# Patient Record
Sex: Female | Born: 1980 | Race: Black or African American | Hispanic: No | Marital: Married | Smoking: Former smoker
Health system: Southern US, Community
[De-identification: ages and names within clinical notes are randomized; demographics above are authoritative.]

## PROBLEM LIST (undated history)

## (undated) ENCOUNTER — Inpatient Hospital Stay (HOSPITAL_COMMUNITY): Payer: Self-pay

## (undated) DIAGNOSIS — K589 Irritable bowel syndrome without diarrhea: Secondary | ICD-10-CM

## (undated) DIAGNOSIS — D649 Anemia, unspecified: Secondary | ICD-10-CM

## (undated) DIAGNOSIS — K219 Gastro-esophageal reflux disease without esophagitis: Secondary | ICD-10-CM

## (undated) DIAGNOSIS — F419 Anxiety disorder, unspecified: Secondary | ICD-10-CM

## (undated) DIAGNOSIS — R51 Headache: Secondary | ICD-10-CM

## (undated) DIAGNOSIS — M1990 Unspecified osteoarthritis, unspecified site: Secondary | ICD-10-CM

## (undated) DIAGNOSIS — K2210 Ulcer of esophagus without bleeding: Secondary | ICD-10-CM

## (undated) DIAGNOSIS — Z8669 Personal history of other diseases of the nervous system and sense organs: Secondary | ICD-10-CM

## (undated) DIAGNOSIS — B999 Unspecified infectious disease: Secondary | ICD-10-CM

## (undated) HISTORY — DX: Anemia, unspecified: D64.9

## (undated) HISTORY — DX: Personal history of other diseases of the nervous system and sense organs: Z86.69

## (undated) HISTORY — DX: Ulcer of esophagus without bleeding: K22.10

## (undated) HISTORY — PX: WRIST SURGERY: SHX841

## (undated) HISTORY — PX: NO PAST SURGERIES: SHX2092

## (undated) HISTORY — DX: Gastro-esophageal reflux disease without esophagitis: K21.9

## (undated) HISTORY — DX: Irritable bowel syndrome, unspecified: K58.9

---

## 2000-02-07 ENCOUNTER — Emergency Department (HOSPITAL_COMMUNITY): Admission: EM | Admit: 2000-02-07 | Discharge: 2000-02-07 | Payer: Self-pay | Admitting: Emergency Medicine

## 2000-04-14 ENCOUNTER — Emergency Department (HOSPITAL_COMMUNITY): Admission: EM | Admit: 2000-04-14 | Discharge: 2000-04-14 | Payer: Self-pay | Admitting: Emergency Medicine

## 2000-04-14 ENCOUNTER — Encounter: Payer: Self-pay | Admitting: Emergency Medicine

## 2001-02-25 ENCOUNTER — Emergency Department (HOSPITAL_COMMUNITY): Admission: EM | Admit: 2001-02-25 | Discharge: 2001-02-25 | Payer: Self-pay | Admitting: Emergency Medicine

## 2001-03-08 ENCOUNTER — Emergency Department (HOSPITAL_COMMUNITY): Admission: EM | Admit: 2001-03-08 | Discharge: 2001-03-08 | Payer: Self-pay | Admitting: Emergency Medicine

## 2001-05-08 ENCOUNTER — Emergency Department (HOSPITAL_COMMUNITY): Admission: EM | Admit: 2001-05-08 | Discharge: 2001-05-08 | Payer: Self-pay | Admitting: Emergency Medicine

## 2001-08-26 ENCOUNTER — Emergency Department (HOSPITAL_COMMUNITY): Admission: EM | Admit: 2001-08-26 | Discharge: 2001-08-26 | Payer: Self-pay | Admitting: Emergency Medicine

## 2001-08-31 ENCOUNTER — Emergency Department (HOSPITAL_COMMUNITY): Admission: EM | Admit: 2001-08-31 | Discharge: 2001-09-01 | Payer: Self-pay | Admitting: Emergency Medicine

## 2001-09-01 ENCOUNTER — Encounter: Payer: Self-pay | Admitting: Emergency Medicine

## 2001-09-17 ENCOUNTER — Emergency Department (HOSPITAL_COMMUNITY): Admission: EM | Admit: 2001-09-17 | Discharge: 2001-09-17 | Payer: Self-pay | Admitting: Emergency Medicine

## 2001-09-17 ENCOUNTER — Encounter: Payer: Self-pay | Admitting: *Deleted

## 2001-09-23 ENCOUNTER — Emergency Department (HOSPITAL_COMMUNITY): Admission: EM | Admit: 2001-09-23 | Discharge: 2001-09-23 | Payer: Self-pay | Admitting: Emergency Medicine

## 2001-10-21 ENCOUNTER — Encounter: Admission: RE | Admit: 2001-10-21 | Discharge: 2001-11-18 | Payer: Self-pay | Admitting: Orthopedic Surgery

## 2001-12-09 ENCOUNTER — Inpatient Hospital Stay (HOSPITAL_COMMUNITY): Admission: AD | Admit: 2001-12-09 | Discharge: 2001-12-09 | Payer: Self-pay | Admitting: *Deleted

## 2001-12-10 ENCOUNTER — Encounter: Admission: RE | Admit: 2001-12-10 | Discharge: 2002-01-30 | Payer: Self-pay | Admitting: Orthopedic Surgery

## 2002-01-05 ENCOUNTER — Emergency Department (HOSPITAL_COMMUNITY): Admission: EM | Admit: 2002-01-05 | Discharge: 2002-01-05 | Payer: Self-pay | Admitting: Emergency Medicine

## 2002-01-17 ENCOUNTER — Encounter: Payer: Self-pay | Admitting: Emergency Medicine

## 2002-01-17 ENCOUNTER — Emergency Department (HOSPITAL_COMMUNITY): Admission: EM | Admit: 2002-01-17 | Discharge: 2002-01-17 | Payer: Self-pay | Admitting: Emergency Medicine

## 2002-03-13 ENCOUNTER — Encounter: Admission: RE | Admit: 2002-03-13 | Discharge: 2002-03-31 | Payer: Self-pay | Admitting: Anesthesiology

## 2002-04-02 ENCOUNTER — Encounter: Admission: RE | Admit: 2002-04-02 | Discharge: 2002-07-01 | Payer: Self-pay

## 2002-05-21 ENCOUNTER — Emergency Department (HOSPITAL_COMMUNITY): Admission: EM | Admit: 2002-05-21 | Discharge: 2002-05-22 | Payer: Self-pay | Admitting: Emergency Medicine

## 2002-06-17 ENCOUNTER — Encounter: Payer: Self-pay | Admitting: Emergency Medicine

## 2002-06-17 ENCOUNTER — Emergency Department (HOSPITAL_COMMUNITY): Admission: EM | Admit: 2002-06-17 | Discharge: 2002-06-17 | Payer: Self-pay | Admitting: Emergency Medicine

## 2002-07-22 ENCOUNTER — Encounter: Admission: RE | Admit: 2002-07-22 | Discharge: 2002-10-20 | Payer: Self-pay

## 2002-08-10 ENCOUNTER — Emergency Department (HOSPITAL_COMMUNITY): Admission: EM | Admit: 2002-08-10 | Discharge: 2002-08-10 | Payer: Self-pay | Admitting: Emergency Medicine

## 2002-11-04 ENCOUNTER — Emergency Department (HOSPITAL_COMMUNITY): Admission: EM | Admit: 2002-11-04 | Discharge: 2002-11-04 | Payer: Self-pay | Admitting: Emergency Medicine

## 2002-11-04 ENCOUNTER — Encounter: Payer: Self-pay | Admitting: Emergency Medicine

## 2002-12-26 ENCOUNTER — Emergency Department (HOSPITAL_COMMUNITY): Admission: EM | Admit: 2002-12-26 | Discharge: 2002-12-26 | Payer: Self-pay | Admitting: Emergency Medicine

## 2003-01-27 ENCOUNTER — Emergency Department (HOSPITAL_COMMUNITY): Admission: EM | Admit: 2003-01-27 | Discharge: 2003-01-27 | Payer: Self-pay | Admitting: Emergency Medicine

## 2003-01-27 ENCOUNTER — Encounter: Payer: Self-pay | Admitting: Emergency Medicine

## 2003-03-02 ENCOUNTER — Emergency Department (HOSPITAL_COMMUNITY): Admission: EM | Admit: 2003-03-02 | Discharge: 2003-03-02 | Payer: Self-pay | Admitting: Emergency Medicine

## 2003-03-29 ENCOUNTER — Encounter: Payer: Self-pay | Admitting: Emergency Medicine

## 2003-03-29 ENCOUNTER — Emergency Department (HOSPITAL_COMMUNITY): Admission: EM | Admit: 2003-03-29 | Discharge: 2003-03-29 | Payer: Self-pay | Admitting: Emergency Medicine

## 2003-04-16 ENCOUNTER — Encounter: Payer: Self-pay | Admitting: Family Medicine

## 2003-04-16 ENCOUNTER — Emergency Department (HOSPITAL_COMMUNITY): Admission: EM | Admit: 2003-04-16 | Discharge: 2003-04-16 | Payer: Self-pay | Admitting: Emergency Medicine

## 2003-04-22 ENCOUNTER — Emergency Department (HOSPITAL_COMMUNITY): Admission: AD | Admit: 2003-04-22 | Discharge: 2003-04-22 | Payer: Self-pay | Admitting: Family Medicine

## 2003-04-28 ENCOUNTER — Inpatient Hospital Stay (HOSPITAL_COMMUNITY): Admission: AD | Admit: 2003-04-28 | Discharge: 2003-04-28 | Payer: Self-pay | Admitting: Obstetrics and Gynecology

## 2003-05-04 ENCOUNTER — Encounter: Admission: RE | Admit: 2003-05-04 | Discharge: 2003-05-24 | Payer: Self-pay | Admitting: Family Medicine

## 2003-07-20 ENCOUNTER — Emergency Department (HOSPITAL_COMMUNITY): Admission: EM | Admit: 2003-07-20 | Discharge: 2003-07-20 | Payer: Self-pay

## 2003-09-09 ENCOUNTER — Emergency Department (HOSPITAL_COMMUNITY): Admission: EM | Admit: 2003-09-09 | Discharge: 2003-09-09 | Payer: Self-pay | Admitting: Emergency Medicine

## 2003-10-06 ENCOUNTER — Emergency Department (HOSPITAL_COMMUNITY): Admission: EM | Admit: 2003-10-06 | Discharge: 2003-10-06 | Payer: Self-pay | Admitting: Emergency Medicine

## 2003-10-12 ENCOUNTER — Emergency Department (HOSPITAL_COMMUNITY): Admission: EM | Admit: 2003-10-12 | Discharge: 2003-10-12 | Payer: Self-pay | Admitting: Emergency Medicine

## 2003-11-15 ENCOUNTER — Emergency Department (HOSPITAL_COMMUNITY): Admission: EM | Admit: 2003-11-15 | Discharge: 2003-11-15 | Payer: Self-pay | Admitting: Emergency Medicine

## 2004-01-31 ENCOUNTER — Emergency Department (HOSPITAL_COMMUNITY): Admission: EM | Admit: 2004-01-31 | Discharge: 2004-01-31 | Payer: Self-pay | Admitting: Emergency Medicine

## 2004-02-13 ENCOUNTER — Emergency Department (HOSPITAL_COMMUNITY): Admission: EM | Admit: 2004-02-13 | Discharge: 2004-02-13 | Payer: Self-pay | Admitting: *Deleted

## 2004-03-28 ENCOUNTER — Encounter: Admission: RE | Admit: 2004-03-28 | Discharge: 2004-06-26 | Payer: Self-pay | Admitting: Orthopedic Surgery

## 2004-04-09 ENCOUNTER — Emergency Department (HOSPITAL_COMMUNITY): Admission: EM | Admit: 2004-04-09 | Discharge: 2004-04-09 | Payer: Self-pay | Admitting: Emergency Medicine

## 2004-04-10 ENCOUNTER — Ambulatory Visit: Payer: Self-pay | Admitting: Obstetrics & Gynecology

## 2004-04-10 ENCOUNTER — Inpatient Hospital Stay (HOSPITAL_COMMUNITY): Admission: AD | Admit: 2004-04-10 | Discharge: 2004-04-10 | Payer: Self-pay | Admitting: Obstetrics and Gynecology

## 2004-04-12 ENCOUNTER — Observation Stay (HOSPITAL_COMMUNITY): Admission: AD | Admit: 2004-04-12 | Discharge: 2004-04-13 | Payer: Self-pay | Admitting: Obstetrics and Gynecology

## 2004-04-19 ENCOUNTER — Inpatient Hospital Stay (HOSPITAL_COMMUNITY): Admission: AD | Admit: 2004-04-19 | Discharge: 2004-04-21 | Payer: Self-pay | Admitting: Obstetrics and Gynecology

## 2004-04-25 ENCOUNTER — Observation Stay (HOSPITAL_COMMUNITY): Admission: AD | Admit: 2004-04-25 | Discharge: 2004-04-26 | Payer: Self-pay | Admitting: *Deleted

## 2004-07-10 ENCOUNTER — Ambulatory Visit (HOSPITAL_COMMUNITY): Admission: RE | Admit: 2004-07-10 | Discharge: 2004-07-10 | Payer: Self-pay | Admitting: *Deleted

## 2004-07-17 ENCOUNTER — Inpatient Hospital Stay (HOSPITAL_COMMUNITY): Admission: AD | Admit: 2004-07-17 | Discharge: 2004-07-17 | Payer: Self-pay | Admitting: Obstetrics & Gynecology

## 2004-08-07 ENCOUNTER — Inpatient Hospital Stay (HOSPITAL_COMMUNITY): Admission: AD | Admit: 2004-08-07 | Discharge: 2004-08-07 | Payer: Self-pay | Admitting: Obstetrics & Gynecology

## 2004-09-23 ENCOUNTER — Inpatient Hospital Stay (HOSPITAL_COMMUNITY): Admission: AD | Admit: 2004-09-23 | Discharge: 2004-09-23 | Payer: Self-pay | Admitting: Obstetrics

## 2004-11-18 ENCOUNTER — Inpatient Hospital Stay (HOSPITAL_COMMUNITY): Admission: RE | Admit: 2004-11-18 | Discharge: 2004-11-19 | Payer: Self-pay | Admitting: Obstetrics

## 2004-11-24 ENCOUNTER — Inpatient Hospital Stay (HOSPITAL_COMMUNITY): Admission: AD | Admit: 2004-11-24 | Discharge: 2004-11-24 | Payer: Self-pay | Admitting: Obstetrics

## 2004-11-26 ENCOUNTER — Inpatient Hospital Stay (HOSPITAL_COMMUNITY): Admission: AD | Admit: 2004-11-26 | Discharge: 2004-11-28 | Payer: Self-pay | Admitting: Obstetrics

## 2005-01-03 ENCOUNTER — Emergency Department (HOSPITAL_COMMUNITY): Admission: EM | Admit: 2005-01-03 | Discharge: 2005-01-04 | Payer: Self-pay | Admitting: Emergency Medicine

## 2005-01-24 ENCOUNTER — Emergency Department (HOSPITAL_COMMUNITY): Admission: EM | Admit: 2005-01-24 | Discharge: 2005-01-24 | Payer: Self-pay | Admitting: Family Medicine

## 2005-02-03 ENCOUNTER — Emergency Department (HOSPITAL_COMMUNITY): Admission: EM | Admit: 2005-02-03 | Discharge: 2005-02-03 | Payer: Self-pay | Admitting: Emergency Medicine

## 2005-02-17 ENCOUNTER — Emergency Department (HOSPITAL_COMMUNITY): Admission: EM | Admit: 2005-02-17 | Discharge: 2005-02-17 | Payer: Self-pay | Admitting: Emergency Medicine

## 2005-08-29 ENCOUNTER — Emergency Department (HOSPITAL_COMMUNITY): Admission: EM | Admit: 2005-08-29 | Discharge: 2005-08-29 | Payer: Self-pay | Admitting: Family Medicine

## 2005-09-16 ENCOUNTER — Emergency Department (HOSPITAL_COMMUNITY): Admission: EM | Admit: 2005-09-16 | Discharge: 2005-09-16 | Payer: Self-pay | Admitting: Emergency Medicine

## 2005-09-29 ENCOUNTER — Emergency Department (HOSPITAL_COMMUNITY): Admission: EM | Admit: 2005-09-29 | Discharge: 2005-09-29 | Payer: Self-pay | Admitting: Emergency Medicine

## 2005-11-12 ENCOUNTER — Emergency Department (HOSPITAL_COMMUNITY): Admission: EM | Admit: 2005-11-12 | Discharge: 2005-11-12 | Payer: Self-pay | Admitting: Emergency Medicine

## 2005-11-20 ENCOUNTER — Emergency Department (HOSPITAL_COMMUNITY): Admission: EM | Admit: 2005-11-20 | Discharge: 2005-11-21 | Payer: Self-pay | Admitting: Emergency Medicine

## 2006-07-21 ENCOUNTER — Emergency Department (HOSPITAL_COMMUNITY): Admission: EM | Admit: 2006-07-21 | Discharge: 2006-07-21 | Payer: Self-pay | Admitting: Family Medicine

## 2006-07-21 ENCOUNTER — Emergency Department (HOSPITAL_COMMUNITY): Admission: EM | Admit: 2006-07-21 | Discharge: 2006-07-21 | Payer: Self-pay | Admitting: Emergency Medicine

## 2006-09-12 ENCOUNTER — Emergency Department (HOSPITAL_COMMUNITY): Admission: EM | Admit: 2006-09-12 | Discharge: 2006-09-12 | Payer: Self-pay | Admitting: Family Medicine

## 2006-09-16 ENCOUNTER — Emergency Department (HOSPITAL_COMMUNITY): Admission: EM | Admit: 2006-09-16 | Discharge: 2006-09-16 | Payer: Self-pay | Admitting: Emergency Medicine

## 2006-09-20 ENCOUNTER — Emergency Department (HOSPITAL_COMMUNITY): Admission: EM | Admit: 2006-09-20 | Discharge: 2006-09-20 | Payer: Self-pay | Admitting: Emergency Medicine

## 2006-09-21 ENCOUNTER — Emergency Department (HOSPITAL_COMMUNITY): Admission: EM | Admit: 2006-09-21 | Discharge: 2006-09-21 | Payer: Self-pay | Admitting: Emergency Medicine

## 2006-10-17 ENCOUNTER — Emergency Department (HOSPITAL_COMMUNITY): Admission: EM | Admit: 2006-10-17 | Discharge: 2006-10-17 | Payer: Self-pay | Admitting: Family Medicine

## 2007-01-13 ENCOUNTER — Emergency Department (HOSPITAL_COMMUNITY): Admission: EM | Admit: 2007-01-13 | Discharge: 2007-01-13 | Payer: Self-pay | Admitting: Emergency Medicine

## 2007-02-08 ENCOUNTER — Inpatient Hospital Stay (HOSPITAL_COMMUNITY): Admission: AD | Admit: 2007-02-08 | Discharge: 2007-02-09 | Payer: Self-pay | Admitting: Obstetrics

## 2007-04-25 ENCOUNTER — Emergency Department (HOSPITAL_COMMUNITY): Admission: EM | Admit: 2007-04-25 | Discharge: 2007-04-25 | Payer: Self-pay | Admitting: Family Medicine

## 2007-05-04 ENCOUNTER — Inpatient Hospital Stay (HOSPITAL_COMMUNITY): Admission: AD | Admit: 2007-05-04 | Discharge: 2007-05-04 | Payer: Self-pay | Admitting: Obstetrics

## 2007-06-24 ENCOUNTER — Emergency Department (HOSPITAL_COMMUNITY): Admission: EM | Admit: 2007-06-24 | Discharge: 2007-06-24 | Payer: Self-pay | Admitting: Emergency Medicine

## 2007-06-28 ENCOUNTER — Emergency Department (HOSPITAL_COMMUNITY): Admission: EM | Admit: 2007-06-28 | Discharge: 2007-06-28 | Payer: Self-pay | Admitting: Emergency Medicine

## 2007-06-29 ENCOUNTER — Emergency Department (HOSPITAL_COMMUNITY): Admission: EM | Admit: 2007-06-29 | Discharge: 2007-06-29 | Payer: Self-pay | Admitting: Emergency Medicine

## 2007-08-19 ENCOUNTER — Emergency Department (HOSPITAL_COMMUNITY): Admission: EM | Admit: 2007-08-19 | Discharge: 2007-08-19 | Payer: Self-pay | Admitting: Emergency Medicine

## 2007-08-20 ENCOUNTER — Emergency Department (HOSPITAL_COMMUNITY): Admission: EM | Admit: 2007-08-20 | Discharge: 2007-08-20 | Payer: Self-pay | Admitting: Emergency Medicine

## 2007-09-08 ENCOUNTER — Emergency Department (HOSPITAL_COMMUNITY): Admission: EM | Admit: 2007-09-08 | Discharge: 2007-09-08 | Payer: Self-pay | Admitting: Emergency Medicine

## 2007-12-19 ENCOUNTER — Emergency Department (HOSPITAL_COMMUNITY): Admission: EM | Admit: 2007-12-19 | Discharge: 2007-12-19 | Payer: Self-pay | Admitting: Emergency Medicine

## 2008-03-24 ENCOUNTER — Emergency Department (HOSPITAL_COMMUNITY): Admission: EM | Admit: 2008-03-24 | Discharge: 2008-03-24 | Payer: Self-pay | Admitting: Internal Medicine

## 2008-05-13 ENCOUNTER — Emergency Department (HOSPITAL_COMMUNITY): Admission: EM | Admit: 2008-05-13 | Discharge: 2008-05-13 | Payer: Self-pay | Admitting: Emergency Medicine

## 2008-09-03 ENCOUNTER — Emergency Department (HOSPITAL_COMMUNITY): Admission: EM | Admit: 2008-09-03 | Discharge: 2008-09-03 | Payer: Self-pay | Admitting: Emergency Medicine

## 2008-10-17 ENCOUNTER — Emergency Department (HOSPITAL_COMMUNITY): Admission: EM | Admit: 2008-10-17 | Discharge: 2008-10-18 | Payer: Self-pay | Admitting: Emergency Medicine

## 2008-10-19 ENCOUNTER — Emergency Department (HOSPITAL_COMMUNITY): Admission: EM | Admit: 2008-10-19 | Discharge: 2008-10-19 | Payer: Self-pay | Admitting: Emergency Medicine

## 2008-12-04 ENCOUNTER — Emergency Department (HOSPITAL_COMMUNITY): Admission: EM | Admit: 2008-12-04 | Discharge: 2008-12-04 | Payer: Self-pay | Admitting: Emergency Medicine

## 2008-12-06 ENCOUNTER — Emergency Department (HOSPITAL_COMMUNITY): Admission: EM | Admit: 2008-12-06 | Discharge: 2008-12-06 | Payer: Self-pay | Admitting: Emergency Medicine

## 2008-12-06 ENCOUNTER — Emergency Department (HOSPITAL_COMMUNITY): Admission: EM | Admit: 2008-12-06 | Discharge: 2008-12-06 | Payer: Self-pay | Admitting: Family Medicine

## 2009-01-06 ENCOUNTER — Emergency Department (HOSPITAL_COMMUNITY): Admission: EM | Admit: 2009-01-06 | Discharge: 2009-01-06 | Payer: Self-pay | Admitting: Family Medicine

## 2009-03-09 ENCOUNTER — Encounter (INDEPENDENT_AMBULATORY_CARE_PROVIDER_SITE_OTHER): Payer: Self-pay | Admitting: *Deleted

## 2009-03-09 ENCOUNTER — Emergency Department (HOSPITAL_COMMUNITY): Admission: EM | Admit: 2009-03-09 | Discharge: 2009-03-09 | Payer: Self-pay | Admitting: Emergency Medicine

## 2009-03-10 ENCOUNTER — Telehealth: Payer: Self-pay | Admitting: Gastroenterology

## 2009-03-11 ENCOUNTER — Ambulatory Visit: Payer: Self-pay | Admitting: Internal Medicine

## 2009-03-11 DIAGNOSIS — J4520 Mild intermittent asthma, uncomplicated: Secondary | ICD-10-CM

## 2009-03-11 DIAGNOSIS — D649 Anemia, unspecified: Secondary | ICD-10-CM

## 2009-03-11 LAB — CONVERTED CEMR LAB
Basophils Relative: 0.4 % (ref 0.0–3.0)
Eosinophils Relative: 5.1 % — ABNORMAL HIGH (ref 0.0–5.0)
HCT: 36 % (ref 36.0–46.0)
Hemoglobin: 12.4 g/dL (ref 12.0–15.0)
Lymphs Abs: 3.6 10*3/uL (ref 0.7–4.0)
MCV: 96.4 fL (ref 78.0–100.0)
Monocytes Absolute: 0.5 10*3/uL (ref 0.1–1.0)
Neutro Abs: 2.9 10*3/uL (ref 1.4–7.7)
RBC: 3.73 M/uL — ABNORMAL LOW (ref 3.87–5.11)
WBC: 7.4 10*3/uL (ref 4.5–10.5)

## 2009-03-12 ENCOUNTER — Ambulatory Visit: Payer: Self-pay | Admitting: Internal Medicine

## 2009-03-12 ENCOUNTER — Encounter: Payer: Self-pay | Admitting: Gastroenterology

## 2009-03-12 ENCOUNTER — Ambulatory Visit: Payer: Self-pay | Admitting: Infectious Diseases

## 2009-03-12 ENCOUNTER — Inpatient Hospital Stay (HOSPITAL_COMMUNITY): Admission: EM | Admit: 2009-03-12 | Discharge: 2009-03-15 | Payer: Self-pay | Admitting: Emergency Medicine

## 2009-03-22 ENCOUNTER — Encounter (INDEPENDENT_AMBULATORY_CARE_PROVIDER_SITE_OTHER): Payer: Self-pay | Admitting: Internal Medicine

## 2009-03-25 ENCOUNTER — Telehealth: Payer: Self-pay | Admitting: Physician Assistant

## 2009-03-26 ENCOUNTER — Telehealth: Payer: Self-pay | Admitting: Internal Medicine

## 2009-03-28 ENCOUNTER — Ambulatory Visit: Payer: Self-pay | Admitting: Gastroenterology

## 2009-03-28 DIAGNOSIS — K210 Gastro-esophageal reflux disease with esophagitis: Secondary | ICD-10-CM

## 2009-03-29 ENCOUNTER — Encounter: Payer: Self-pay | Admitting: Internal Medicine

## 2009-03-29 ENCOUNTER — Ambulatory Visit: Payer: Self-pay | Admitting: Infectious Diseases

## 2009-03-29 ENCOUNTER — Ambulatory Visit (HOSPITAL_COMMUNITY): Admission: RE | Admit: 2009-03-29 | Discharge: 2009-03-29 | Payer: Self-pay | Admitting: Infectious Diseases

## 2009-03-29 DIAGNOSIS — K589 Irritable bowel syndrome without diarrhea: Secondary | ICD-10-CM

## 2009-03-29 LAB — CONVERTED CEMR LAB
BUN: 5 mg/dL — ABNORMAL LOW (ref 6–23)
CO2: 26 meq/L (ref 19–32)
Calcium: 9.5 mg/dL (ref 8.4–10.5)
Glucose, Bld: 88 mg/dL (ref 70–99)
HCT: 36.2 % (ref 36.0–46.0)
MCHC: 33.8 g/dL (ref 30.0–36.0)
MCV: 96.6 fL (ref >=78.0)
Platelets: 277 10*3/uL (ref 150–400)
Prothrombin Time: 13.7 s (ref 11.6–15.2)

## 2009-03-30 ENCOUNTER — Telehealth: Payer: Self-pay | Admitting: Physician Assistant

## 2009-04-04 ENCOUNTER — Emergency Department (HOSPITAL_COMMUNITY): Admission: EM | Admit: 2009-04-04 | Discharge: 2009-04-04 | Payer: Self-pay | Admitting: Emergency Medicine

## 2009-04-14 ENCOUNTER — Ambulatory Visit: Payer: Self-pay | Admitting: Internal Medicine

## 2009-04-14 DIAGNOSIS — G43909 Migraine, unspecified, not intractable, without status migrainosus: Secondary | ICD-10-CM | POA: Insufficient documentation

## 2009-04-15 ENCOUNTER — Telehealth (INDEPENDENT_AMBULATORY_CARE_PROVIDER_SITE_OTHER): Payer: Self-pay | Admitting: *Deleted

## 2009-05-02 ENCOUNTER — Ambulatory Visit: Payer: Self-pay | Admitting: Internal Medicine

## 2009-05-02 ENCOUNTER — Encounter: Payer: Self-pay | Admitting: Internal Medicine

## 2009-05-02 DIAGNOSIS — J0130 Acute sphenoidal sinusitis, unspecified: Secondary | ICD-10-CM

## 2009-05-02 LAB — CONVERTED CEMR LAB: Sed Rate: 9 mm/hr (ref 0–22)

## 2009-05-25 ENCOUNTER — Telehealth: Payer: Self-pay | Admitting: Internal Medicine

## 2009-05-25 ENCOUNTER — Encounter: Payer: Self-pay | Admitting: Internal Medicine

## 2009-05-31 ENCOUNTER — Telehealth: Payer: Self-pay | Admitting: Physician Assistant

## 2009-06-02 ENCOUNTER — Ambulatory Visit: Payer: Self-pay | Admitting: Internal Medicine

## 2009-06-02 LAB — CONVERTED CEMR LAB
ALT: 14 U/L
AST: 16 U/L
Albumin: 4.5 g/dL
Alkaline Phosphatase: 61 U/L
BUN: 10 mg/dL
CO2: 24 meq/L
Calcium: 8.9 mg/dL
Chlamydia, Swab/Urine, PCR: NEGATIVE
Chloride: 105 meq/L
Creatinine, Ser: 0.78 mg/dL
GC Probe Amp, Urine: NEGATIVE
Glucose, Bld: 99 mg/dL
Potassium: 4 meq/L
Sodium: 139 meq/L
TSH: 0.657 u[IU]/mL
Total Bilirubin: 0.2 mg/dL — ABNORMAL LOW
Total Protein: 7.4 g/dL

## 2009-06-14 ENCOUNTER — Encounter: Payer: Self-pay | Admitting: Internal Medicine

## 2009-06-14 ENCOUNTER — Ambulatory Visit: Payer: Self-pay | Admitting: Infectious Diseases

## 2009-06-14 LAB — CONVERTED CEMR LAB
Bilirubin Urine: NEGATIVE
Blood in Urine, dipstick: NEGATIVE
Glucose, Urine, Semiquant: NEGATIVE
Ketones, urine, test strip: NEGATIVE
Preg, Serum: NEGATIVE
Specific Gravity, Urine: 1.015
Urobilinogen, UA: 0.2

## 2009-06-22 ENCOUNTER — Telehealth: Payer: Self-pay | Admitting: Licensed Clinical Social Worker

## 2009-07-12 ENCOUNTER — Ambulatory Visit (HOSPITAL_COMMUNITY): Admission: AD | Admit: 2009-07-12 | Discharge: 2009-07-12 | Payer: Self-pay | Admitting: Obstetrics

## 2009-08-10 ENCOUNTER — Telehealth: Payer: Self-pay | Admitting: Internal Medicine

## 2009-08-25 ENCOUNTER — Emergency Department (HOSPITAL_COMMUNITY): Admission: EM | Admit: 2009-08-25 | Discharge: 2009-08-25 | Payer: Self-pay | Admitting: Emergency Medicine

## 2009-09-19 ENCOUNTER — Telehealth: Payer: Self-pay | Admitting: *Deleted

## 2009-09-29 ENCOUNTER — Ambulatory Visit: Payer: Self-pay | Admitting: Internal Medicine

## 2009-10-03 ENCOUNTER — Telehealth: Payer: Self-pay | Admitting: Internal Medicine

## 2009-10-03 ENCOUNTER — Emergency Department (HOSPITAL_COMMUNITY): Admission: EM | Admit: 2009-10-03 | Discharge: 2009-10-03 | Payer: Self-pay | Admitting: Emergency Medicine

## 2009-10-03 ENCOUNTER — Ambulatory Visit: Payer: Self-pay | Admitting: Internal Medicine

## 2009-11-23 ENCOUNTER — Telehealth: Payer: Self-pay | Admitting: *Deleted

## 2009-11-24 ENCOUNTER — Emergency Department (HOSPITAL_COMMUNITY): Admission: EM | Admit: 2009-11-24 | Discharge: 2009-11-24 | Payer: Self-pay | Admitting: Family Medicine

## 2009-11-24 ENCOUNTER — Telehealth: Payer: Self-pay | Admitting: Internal Medicine

## 2009-12-06 ENCOUNTER — Ambulatory Visit: Payer: Self-pay | Admitting: Internal Medicine

## 2009-12-27 ENCOUNTER — Ambulatory Visit: Payer: Self-pay | Admitting: Internal Medicine

## 2010-01-01 ENCOUNTER — Emergency Department (HOSPITAL_COMMUNITY): Admission: EM | Admit: 2010-01-01 | Discharge: 2010-01-01 | Payer: Self-pay | Admitting: Family Medicine

## 2010-01-11 ENCOUNTER — Ambulatory Visit: Payer: Self-pay | Admitting: Internal Medicine

## 2010-01-26 ENCOUNTER — Ambulatory Visit: Payer: Self-pay | Admitting: Internal Medicine

## 2010-01-26 DIAGNOSIS — H1030 Unspecified acute conjunctivitis, unspecified eye: Secondary | ICD-10-CM | POA: Insufficient documentation

## 2010-01-30 ENCOUNTER — Ambulatory Visit: Payer: Self-pay | Admitting: Internal Medicine

## 2010-02-03 ENCOUNTER — Emergency Department (HOSPITAL_COMMUNITY): Admission: EM | Admit: 2010-02-03 | Discharge: 2010-02-03 | Payer: Self-pay | Admitting: Emergency Medicine

## 2010-02-03 ENCOUNTER — Telehealth (INDEPENDENT_AMBULATORY_CARE_PROVIDER_SITE_OTHER): Payer: Self-pay | Admitting: *Deleted

## 2010-03-07 ENCOUNTER — Emergency Department (HOSPITAL_COMMUNITY): Admission: EM | Admit: 2010-03-07 | Discharge: 2010-03-07 | Payer: Self-pay | Admitting: Emergency Medicine

## 2010-03-20 ENCOUNTER — Ambulatory Visit: Payer: Self-pay | Admitting: Internal Medicine

## 2010-05-02 ENCOUNTER — Emergency Department (HOSPITAL_COMMUNITY)
Admission: EM | Admit: 2010-05-02 | Discharge: 2010-05-02 | Payer: Self-pay | Source: Home / Self Care | Admitting: Emergency Medicine

## 2010-05-03 ENCOUNTER — Ambulatory Visit: Payer: Self-pay | Admitting: Internal Medicine

## 2010-05-03 ENCOUNTER — Encounter: Payer: Self-pay | Admitting: Internal Medicine

## 2010-05-03 DIAGNOSIS — N760 Acute vaginitis: Secondary | ICD-10-CM | POA: Insufficient documentation

## 2010-05-03 DIAGNOSIS — N390 Urinary tract infection, site not specified: Secondary | ICD-10-CM

## 2010-06-09 ENCOUNTER — Ambulatory Visit: Payer: Self-pay

## 2010-06-14 ENCOUNTER — Ambulatory Visit: Payer: Self-pay | Admitting: Internal Medicine

## 2010-06-14 ENCOUNTER — Ambulatory Visit: Payer: Self-pay

## 2010-07-13 ENCOUNTER — Emergency Department (HOSPITAL_COMMUNITY)
Admission: EM | Admit: 2010-07-13 | Discharge: 2010-07-13 | Payer: Self-pay | Source: Home / Self Care | Admitting: Emergency Medicine

## 2010-08-01 NOTE — Assessment & Plan Note (Signed)
Summary: DEPO SHOT  Nurse Visit   Allergies: No Known Drug Allergies  Medication Administration  Injection # 1:    Medication: Depo-Provera 150mg     Diagnosis: CONTRACEPTIVE MANAGEMENT (ICD-V25.09)    Route: IM    Site: LUOQ gluteus    Exp Date: 10/2011    Lot #: [id number]    Mfr: Francisca December    Patient tolerated injection without complications    Given by: Angelina Ok RN (December 27, 2009 3:15 PM)  Orders Added: 1)  Depo-Provera 150mg  [J1055] 2)  Admin of Therapeutic Inj  intramuscular or subcutaneous [96372]   Medication Administration  Injection # 1:    Medication: Depo-Provera 150mg     Diagnosis: CONTRACEPTIVE MANAGEMENT (ICD-V25.09)    Route: IM    Site: LUOQ gluteus    Exp Date: 10/2011    Lot #: [id number]    Mfr: Francisca December    Patient tolerated injection without complications    Given by: Angelina Ok RN (December 27, 2009 3:15 PM)  Orders Added: 1)  Depo-Provera 150mg  [J1055] 2)  Admin of Therapeutic Inj  intramuscular or subcutaneous [[id number]]

## 2010-08-01 NOTE — Progress Notes (Signed)
Summary: weakness, low BP/hla  Phone Note Call from Patient   Summary of Call: pt calls to say she got weak at work, the company nurse checked her BP and told her it was low, she lost the paper they gave her at work with her Bp written on it, she wants to be seen asap, she is referred to urgent care for evaluation due to a a full schedule 5/26. she is agreeable to this and states she will ahve someone to drive her. Initial call taken by: Marin Roberts RN,  Nov 24, 2009 10:31 AM

## 2010-08-01 NOTE — Progress Notes (Signed)
Summary: med request/gp  Phone Note Refill Request Message from:  Patient on Nov 24, 2009 10:26 AM  Refills Requested: Medication #1:  VENTOLIN HFA 108 (90 BASE) MCG/ACT AERS One puff as neede for shortnes of breath Also pt. states she went to Urgent Care for stomach and chest pain and Tramadol is not controlling the pain; request something else.    Method Requested: Telephone to Pharmacy Initial call taken by: Chinita Pester RN,  Nov 24, 2009 10:26 AM  Follow-up for Phone Call        Refill approved-nurse to complete Follow-up by: Melida Quitter MD,  Nov 24, 2009 8:32 PM  Additional Follow-up for Phone Call Additional follow up Details #1::        I'm sorry but patient will need to make an appointment to assess her pain symptoms. I cannot give anything else until then.     Additional Follow-up for Phone Call Additional follow up Details #2::    Pt. called; message left for pt. to call  the clinic. Pt. has an appt. June 7. Ventolin hx called to [facility] pharmacy. Follow-up by: Chinita Pester RN,  Nov 25, 2009 10:27 AM  Additional Follow-up for Phone Call Additional follow up Details #3:: Details for Additional Follow-up Action Taken: I talked to pt.; she will keep her appt. on June 7. Additional Follow-up by: Chinita Pester RN,  Nov 25, 2009 11:20 AM  Prescriptions: VENTOLIN HFA 108 (90 BASE) MCG/ACT AERS (ALBUTEROL SULFATE) One puff as neede for shortnes of breath  #1 x 3   Entered and Authorized by:   Melida Quitter MD   Signed by:   Melida Quitter MD on 11/24/2009   Method used:   Telephoned to ...       [facility] Department (retail)       [address] [place], [place]  [address]       Ph: [phone]       Fax: [phone]   RxID:   [id number]

## 2010-08-01 NOTE — Progress Notes (Signed)
Summary: phone/gg  Phone Note Call from Patient   Caller: Patient Summary of Call: Pt c/o black stools since last night. She states she had severe abd pain and large loose stool that was black.  Today feels gittery, weak, lightheaded and has burning to epigastric area.  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  She was admitted at that time.   Because of symptoms discribed advised pt to go to ED at once for evaluation.  She has someone to bring her. Initial call taken by: Merrie Roof RN,  February 03, 2010 9:55 AM  Follow-up for Phone Call        Discussed and agree. Follow-up by: Zoila Shutter MD,  February 03, 2010 11:14 AM

## 2010-08-01 NOTE — Progress Notes (Signed)
Summary: depo shot/gp  Phone Note Other Incoming   Summary of Call: Pt. request Depo shot; states it's due now.  Also states las tDepo was given at [facility].   Looking in [place], it was given last 1/1//11at [facility]; due 10/03/09.  I will scan ED note into pt.'s chart.  Pt. was called back by [facility] for an appt. on April 4. Initial call taken by: Chinita Pester RN,  September 19, 2009 11:34 AM

## 2010-08-01 NOTE — Assessment & Plan Note (Signed)
Summary: NOT A HFU-STOMACH BURNING AND HURTING/CFB   Vital Signs:  Patient profile:   30 year old female Height:      67.5 inches Weight:      187.9 pounds BMI:     29.10 Temp:     97.7 degrees F oral Pulse rate:   80 / minute BP sitting:   130 / 80  (right arm)  Vitals Entered By: Filomena Jungling NT II (September 29, 2009 2:57 PM) CC: BLACK STOOLS NOTIICED ON MONDAY- STOMACH PAIN SINCE LAST NIGHT/ NEED REFILLS  TO HEALTH DEPT Pain Assessment Patient in pain? yes     Location: abdomen Intensity: 8 Type: aching Onset of pain  LAST NIGHT Nutritional Status BMI of 25 - 29 = overweight  Have you ever been in a relationship where you felt threatened, hurt or afraid?No   Does patient need assistance? Functional Status Self care Ambulation Normal   Primary Care Provider:  Melida Quitter MD  CC:  BLACK STOOLS NOTIICED ON MONDAY- STOMACH PAIN SINCE LAST NIGHT/ NEED REFILLS  TO HEALTH DEPT.  History of Present Illness: 30 yrs old female with Past Medical History: Asthma GERD EROSIVE DISTAL  ESOPHAGITIS WITH LOW GRADE BLEED 9/10 seasonal allergies here with:  1. stomach pain and nausea- ? related to GERD. Not taking any medications as she has recently lost medicaid and only has [employer] card. She presumes her epigastric pain is 2/2 not taking medications and would like refills.   She states she noted one episode of blood in stool. Her stool is darker in colour. Her epigastric pain is burning in nature, awakes her from sleep and had worsened after having a bowel movement. No recent changes in diet. Pain does not radiate. 5-6/10 in intensity.     Current Medications (verified): 1)  Depo-Provera 150 Mg/ml Susp (Medroxyprogesterone Acetate) .... Q 3 Months 2)  Ventolin Hfa 108 (90 Base) Mcg/act Aers (Albuterol Sulfate) .... One Puff As Neede For Shortnes of Breath 3)  Dicyclomine Hcl 20 Mg Tabs (Dicyclomine Hcl) .... Take One By Mouth Three Times A Day 4)  Zyrtec Allergy 10 Mg Tabs  (Cetirizine Hcl) .... Take 1 Tablet By Mouth Once A Day 5)  Fluticasone Propionate 50 Mcg/act Susp (Fluticasone Propionate) .... 2 Sprays Into Each Nostril Once Daily 6)  Tramadol Hcl 50 Mg Tabs (Tramadol Hcl) .... Take 1 Tablet By Mouth Every 8 Hour For Pain As Needed. 7)  Tylenol Extra Strength 500 Mg Tabs (Acetaminophen) .... Three Times A Day As Needed  Allergies (verified): No Known Drug Allergies  Past History:  Past Medical History: Last updated: 03/28/2009 Asthma GERD EROSIVE DISTAL  ESOPHAGITIS WITH LOW GRADE BLEED 9/10  Past Surgical History: Last updated: 03/11/2009 Unremarkable  Family History: Last updated: 03/28/2009 Family History of Breast Cancer: Grandmother Family History of Heart Disease: Mother No FH of Colon Cancer:  Social History: Last updated: 09/29/2009 Occupation: Works at [employer]  married Patient has never smoked.  Alcohol Use - no Daily Caffeine Use Illicit Drug Use - no  Risk Factors: Alcohol Use: 0 (06/14/2009) Exercise: yes (06/14/2009)  Risk Factors: Smoking Status: never (06/14/2009)  Social History: Occupation: Works at [employer]  married Patient has never smoked.  Alcohol Use - no Daily Caffeine Use Illicit Drug Use - no  Review of Systems GI:  Complains of abdominal pain, change in bowel habits, dark tarry stools, and indigestion; denies nausea and vomiting. GU:  Denies dysuria, nocturia, urinary frequency, and urinary hesitancy.  Physical  Exam  General:  alert and well-developed.   Head:  normocephalic and atraumatic.   Eyes:  vision grossly intact and pupils equal.   Neck:  supple, full ROM, and no masses.   Lungs:  normal respiratory effort, no intercostal retractions, no accessory muscle use, normal breath sounds, no dullness, no fremitus, no crackles, and no wheezes.   Heart:  normal rate, regular rhythm, no murmur, no gallop, and no rub.   Abdomen:  soft, tender to palpation in epigastric region, no  guarding or rebound tenderness, bowel sounds are present but hypoactive    Impression & Recommendations:  Problem # 1:  REFLUX ESOPHAGITIS (ICD-530.11) Assessment Deteriorated Not on any meds due to financial difficulties. Hemodynamically stable, no active bleeding. No nausea or vomiting. Provided patient with prilosec samples.   Plan: - restart PPI - follow clinically, patient had a full GI work-up in hospital 03/2009  The following medications were removed from the medication list:    Famotidine 20 Mg Tabs (Famotidine) .Marland Kitchen... Take 1 tablet by mouth two times a day Her updated medication list for this problem includes:    Dicyclomine Hcl 20 Mg Tabs (Dicyclomine hcl) .Marland Kitchen... Take one by mouth three times a day    Pantoprazole Sodium 40 Mg Tbec (Pantoprazole sodium) .Marland Kitchen... Take 2 tabs once a day for 1 week and then take 1 tab once daily.  Problem # 2:  ASTHMA UNSPECIFIED (ICD-493.90) Assessment: Comment Only Patient has not had any recent episodes of shortness of breath or using resue inhaler more often. Provided a sample of advair to patient. Lung exam is stable.   Her updated medication list for this problem includes:    Ventolin Hfa 108 (90 Base) Mcg/act Aers (Albuterol sulfate) ..... One puff as neede for shortnes of breath    Advair Diskus 250-50 Mcg/dose Aepb (Fluticasone-salmeterol) .Marland Kitchen... 2 puffs once daily.  Complete Medication List: 1)  Depo-provera 150 Mg/ml Susp (Medroxyprogesterone acetate) .... Q 3 months 2)  Ventolin Hfa 108 (90 Base) Mcg/act Aers (Albuterol sulfate) .... One puff as neede for shortnes of breath 3)  Dicyclomine Hcl 20 Mg Tabs (Dicyclomine hcl) .... Take one by mouth three times a day 4)  Zyrtec Allergy 10 Mg Tabs (Cetirizine hcl) .... Take 1 tablet by mouth once a day 5)  Fluticasone Propionate 50 Mcg/act Susp (Fluticasone propionate) .... 2 sprays into each nostril once daily 6)  Tramadol Hcl 50 Mg Tabs (Tramadol hcl) .... Take 1 tablet by mouth every 8  hour for pain as needed. 7)  Tylenol Extra Strength 500 Mg Tabs (Acetaminophen) .... Three times a day as needed 8)  Pantoprazole Sodium 40 Mg Tbec (Pantoprazole sodium) .... Take 2 tabs once a day for 1 week and then take 1 tab once daily. 9)  Advair Diskus 250-50 Mcg/dose Aepb (Fluticasone-salmeterol) .... 2 puffs once daily.  Patient Instructions: 1)  Please follow up to the clinic if your abdominal pain does not resolve. Please take all medications, especially Pantoprazole as directed.  Prescriptions: PANTOPRAZOLE SODIUM 40 MG TBEC (PANTOPRAZOLE SODIUM) Take 2 tabs once a day for 1 week and then take 1 tab once daily.  #36 x 3   Entered and Authorized by:   Melida Quitter MD   Signed by:   Melida Quitter MD on 09/29/2009   Method used:   Print then Give to Patient   RxID:   [id number] TRAMADOL HCL 50 MG TABS (TRAMADOL HCL) Take 1 tablet by mouth every 8 hour for pain as needed.  #  90 x 3   Entered and Authorized by:   Melida Quitter MD   Signed by:   Melida Quitter MD on 09/29/2009   Method used:   Print then Give to Patient   RxID:   [id number] FLUTICASONE PROPIONATE 50 MCG/ACT SUSP (FLUTICASONE PROPIONATE) 2 sprays into each nostril once daily  #1 x 3   Entered and Authorized by:   Melida Quitter MD   Signed by:   Melida Quitter MD on 09/29/2009   Method used:   Print then Give to Patient   RxID:   [id number] DICYCLOMINE HCL 20 MG TABS (DICYCLOMINE HCL) take one by mouth three times a day  #90 x 3   Entered and Authorized by:   Melida Quitter MD   Signed by:   Melida Quitter MD on 09/29/2009   Method used:   Print then Give to Patient   RxID:   [id number] FLUTICASONE PROPIONATE 50 MCG/ACT SUSP (FLUTICASONE PROPIONATE) 2 sprays into each nostril once daily  #1 x 3   Entered and Authorized by:   Melida Quitter MD   Signed by:   Melida Quitter MD on 09/29/2009   Method used:   Electronically to        [facility] (retail)       [address]        [place], [place]  [address]       Ph: [phone]       Fax: [phone]   RxID:   [id number] PANTOPRAZOLE SODIUM 40 MG TBEC (PANTOPRAZOLE SODIUM) Take 2 tabs once a day for 1 week and then take 1 tab once daily.  #36 x 3   Entered and Authorized by:   Melida Quitter MD   Signed by:   Melida Quitter MD on 09/29/2009   Method used:   Electronically to        [facility] (retail)       [address]       [place], [place]  [address]       Ph: [phone]       Fax: [phone]   RxID:   [id number] TRAMADOL HCL 50 MG TABS (TRAMADOL HCL) Take 1 tablet by mouth every 8 hour for pain as needed.  #90 x 3   Entered and Authorized by:   Melida Quitter MD   Signed by:   Melida Quitter MD on 09/29/2009   Method used:   Electronically to        [facility] (retail)       [address]       [place], [place]  [address]       Ph: [phone]       Fax: [phone]   RxID:   [id number] DICYCLOMINE HCL 20 MG TABS (DICYCLOMINE HCL) take one by mouth three times a day  #90 x 3   Entered and Authorized by:   Melida Quitter MD   Signed by:   Melida Quitter MD on 09/29/2009   Method used:   Electronically to        [facility] (retail)       [address]       [place], [place]  [address]       Ph: [phone]  Fax: [phone]   RxID:   [id number]   Prevention & Chronic Care Immunizations   Influenza vaccine: Not documented   Influenza vaccine deferral: Refused  (09/29/2009)    Tetanus booster: Not documented    Pneumococcal vaccine: Not documented  Other Screening   Pap smear: Not documented   Pap smear action/deferral: Refused  (09/29/2009)   Smoking status: never  (06/14/2009)

## 2010-08-01 NOTE — Letter (Signed)
Summary: Out of Work  [facility]  [address]   [place], [place] [address]   Phone: [phone]  Fax: [phone]    May 03, 2010   Employee:  [name]    To Whom It May Concern:   For Medical reasons, please excuse the above named employee from work for the following dates:  Start:   05/03/10  End:   05/04/10  If you need additional information, please feel free to contact our office.         Sincerely,    Elyse Jarvis

## 2010-08-01 NOTE — Progress Notes (Signed)
Summary: refill/ hla  Phone Note Refill Request Message from:  Patient on August 10, 2009 1:37 PM  Refills Requested: Medication #1:  VENTOLIN HFA 108 (90 BASE) MCG/ACT AERS One puff as neede for shortnes of breath Initial call taken by: Marin Roberts RN,  August 10, 2009 1:37 PM    Prescriptions: VENTOLIN HFA 108 (90 BASE) MCG/ACT AERS (ALBUTEROL SULFATE) One puff as neede for shortnes of breath  #1 x 3   Entered and Authorized by:   Melida Quitter MD   Signed by:   Melida Quitter MD on 08/11/2009   Method used:   Telephoned to ...       [facility] Department (retail)       [address] [place], [place]  [address]       Ph: [phone]       Fax: [phone]   RxID:   [id number]   Appended Document: refill/ hla Clarification per Dr. Baltazar Apo- every 6 hrs as needed.  Above Rx called to [facility] pharmacy.

## 2010-08-01 NOTE — Assessment & Plan Note (Signed)
Summary: acute-re-check eyes per dr pribula/[facility]b([facility])   Vital Signs:  Patient profile:   30 year old female Height:      67.5 inches (171.45 cm) Weight:      180.4 pounds (82 kg) BMI:     27.94 Temp:     97.7 degrees F (36.50 degrees C) oral Pulse rate:   72 / minute BP sitting:   135 / 85  (right arm) Cuff size:   regular  Vitals Entered By: Filomena Jungling NT II (January 30, 2010 2:28 PM) CC: FOLLOW-UP APPOINTMENT FOR RIGHT EYE RECHECK Is Patient Diabetic? No Pain Assessment Patient in pain? yes       Have you ever been in a relationship where you felt threatened, hurt or afraid?No   Does patient need assistance? Functional Status Self care Ambulation Normal Comments PATIENT IS HER FOR RIGHT EYE RECHECK /    Primary Care Provider:  Melida Quitter MD  CC:  FOLLOW-UP APPOINTMENT FOR RIGHT EYE RECHECK.  History of Present Illness: Follow up for conjunctivitis. Reprots feeling better.  Preventive Screening-Counseling & Management  Alcohol-Tobacco     Alcohol drinks/day: 0     Smoking Status: never  Caffeine-Diet-Exercise     Does Patient Exercise: yes     Type of exercise: WALKING     Exercise (avg: min/session): 30-60     Times/week: 5  Problems Prior to Update: 1)  Conjunctivitis, Acute, Right  (ICD-372.00) 2)  Contraceptive Management  (ICD-V25.09) 3)  Inadequate Material Resources  (ICD-V60.2) 4)  Acute Sphenoidal Sinusitis  (ICD-461.3) 5)  Migraine, Chronic  (ICD-346.90) 6)  Irritable Bowel Syndrome  (ICD-564.1) 7)  Reflux Esophagitis  (ICD-530.11) 8)  Asthma Unspecified  (ICD-493.90) 9)  Anemia  (ICD-285.9)  Current Problems (verified): 1)  Conjunctivitis, Acute, Right  (ICD-372.00) 2)  Contraceptive Management  (ICD-V25.09) 3)  Inadequate Material Resources  (ICD-V60.2) 4)  Acute Sphenoidal Sinusitis  (ICD-461.3) 5)  Migraine, Chronic  (ICD-346.90) 6)  Irritable Bowel Syndrome  (ICD-564.1) 7)  Reflux Esophagitis  (ICD-530.11) 8)  Asthma Unspecified   (ICD-493.90) 9)  Anemia  (ICD-285.9)  Medications Prior to Update: 1)  Depo-Provera 150 Mg/ml Susp (Medroxyprogesterone Acetate) .... Q 3 Months 2)  Ventolin Hfa 108 (90 Base) Mcg/act Aers (Albuterol Sulfate) .... One Puff As Neede For [employer] of Breath 3)  Fluticasone Propionate 50 Mcg/act Susp (Fluticasone Propionate) .... 2 Sprays Into Each Nostril Once Daily 4)  Tramadol Hcl 50 Mg Tabs (Tramadol Hcl) .... Take 1 Tablet By Mouth Every 8 Hour For Pain As Needed. 5)  Pantoprazole Sodium 40 Mg Tbec (Pantoprazole Sodium) .... Take 2 Tabs Once A Day For 1 Week and Then Take 1 Tab Once Daily. 6)  Advair Diskus 250-50 Mcg/dose Aepb (Fluticasone-Salmeterol) .... 2 Puffs Once Daily. 7)  Carafate 1 Gm Tabs (Sucralfate) .... Take 1 Tablet By Mouth Four Times A Day 8)  Pepcid 40 Mg Tabs (Famotidine) .... Take 1 Tablet By Mouth Two Times A Day 9)  Zyrtec Allergy 10 Mg Tabs (Cetirizine Hcl) .... Take 1 Tablet By Mouth Once A Day As Needed For Sinus 10)  Ciloxan 0.3 % Soln (Ciprofloxacin Hcl) .... Instill 1-2 Drops Into The Affected Eye 4 Times Daily For 7 Days.  Current Medications (verified): 1)  Depo-Provera 150 Mg/ml Susp (Medroxyprogesterone Acetate) .... Q 3 Months 2)  Ventolin Hfa 108 (90 Base) Mcg/act Aers (Albuterol Sulfate) .... One Puff As Neede For [employer] of Breath 3)  Fluticasone Propionate 50 Mcg/act Susp (Fluticasone Propionate) .... 2 Sprays  Into Each Nostril Once Daily 4)  Tramadol Hcl 50 Mg Tabs (Tramadol Hcl) .... Take 1 Tablet By Mouth Every 8 Hour For Pain As Needed. 5)  Pantoprazole Sodium 40 Mg Tbec (Pantoprazole Sodium) .... Take 2 Tabs Once A Day For 1 Week and Then Take 1 Tab Once Daily. 6)  Advair Diskus 250-50 Mcg/dose Aepb (Fluticasone-Salmeterol) .... 2 Puffs Once Daily. 7)  Carafate 1 Gm Tabs (Sucralfate) .... Take 1 Tablet By Mouth Four Times A Day 8)  Pepcid 40 Mg Tabs (Famotidine) .... Take 1 Tablet By Mouth Two Times A Day 9)  Zyrtec Allergy 10 Mg Tabs (Cetirizine  Hcl) .... Take 1 Tablet By Mouth Once A Day As Needed For Sinus 10)  Ciloxan 0.3 % Soln (Ciprofloxacin Hcl) .... Instill 1-2 Drops Into The Affected Eye 4 Times Daily For 7 Days.  Allergies (verified): No Known Drug Allergies  Directives (verified): 1)  Full Code   Past History:  Past Medical History: Last updated: 03/28/2009 Asthma GERD EROSIVE DISTAL  ESOPHAGITIS WITH LOW GRADE BLEED 9/10  Past Surgical History: Last updated: 03/11/2009 Unremarkable  Family History: Last updated: 03/28/2009 Family History of Breast Cancer: Grandmother Family History of Heart Disease: Mother No FH of Colon Cancer:  Social History: Last updated: 09/29/2009 Occupation: Works at [employer]  married Patient has never smoked.  Alcohol Use - no Daily Caffeine Use Illicit Drug Use - no  Risk Factors: Alcohol Use: 0 (01/30/2010) Exercise: yes (01/30/2010)  Risk Factors: Smoking Status: never (01/30/2010)  Review of Systems       per HPI  Physical Exam  General:  alert and well-developed.   Head:  normocephalic and atraumatic.   Eyes:  vision grossly intact and pupils equal.  OS with mild erythmea and small amount brown and thick discharge to medial epicanthus. Ears:  External ear exam shows no significant lesions or deformities.  Otoscopic examination reveals clear canals, tympanic membranes are intact bilaterally without bulging, retraction, inflammation or discharge. Hearing is grossly normal bilaterally. Nose:  External nasal examination shows no deformity or inflammation. Nasal mucosa are pink and moist without lesions or exudates. Mouth:  Oral mucosa and oropharynx without lesions or exudates.  Teeth in good repair. Neck:  No deformities, masses, or tenderness noted. Lungs:  Normal respiratory effort, chest expands symmetrically. Lungs are clear to auscultation, no crackles or wheezes. Heart:  Normal rate and regular rhythm. S1 and S2 normal without gallop, murmur, click, rub  or other extra sounds. Abdomen:  Bowel sounds positive,abdomen soft and non-tender without masses, organomegaly or hernias noted. Msk:  No deformity or scoliosis noted of thoracic or lumbar spine.   Pulses:  R and L carotid,radial,femoral,dorsalis pedis and posterior tibial pulses are full and equal bilaterally Extremities:  No clubbing, cyanosis, edema, or deformity noted with normal full range of motion of all joints.   Neurologic:  alert & oriented X3 and cranial nerves II-XII intact.   Skin:  Intact without suspicious lesions or rashes Cervical Nodes:  No lymphadenopathy noted Axillary Nodes:  No palpable lymphadenopathy Psych:  Cognition and judgment appear intact. Alert and cooperative with normal attention span and concentration. No apparent delusions, illusions, hallucinations   Impression & Recommendations:  Problem # 1:  CONJUNCTIVITIS, ACUTE, RIGHT (ICD-372.00)  Improving but still looks inflammed with small amount of thick, brown discharge at medial epicathus.Will continu with drops for another 5 days. patient is to call if no improvement. Released to work. Her updated medication list for this problem includes:  Ciloxan 0.3 % Soln (Ciprofloxacin hcl) ..... Instill 1-2 drops into the affected eye 4 times daily for 7 days.  Discussed treatment, and urged patient to wash hands carefully after touching face.   Complete Medication List: 1)  Depo-provera 150 Mg/ml Susp (Medroxyprogesterone acetate) .... Q 3 months 2)  Ventolin Hfa 108 (90 Base) Mcg/act Aers (Albuterol sulfate) .... One puff as neede for shortnes of breath 3)  Fluticasone Propionate 50 Mcg/act Susp (Fluticasone propionate) .... 2 sprays into each nostril once daily 4)  Tramadol Hcl 50 Mg Tabs (Tramadol hcl) .... Take 1 tablet by mouth every 8 hour for pain as needed. 5)  Pantoprazole Sodium 40 Mg Tbec (Pantoprazole sodium) .... Take 2 tabs once a day for 1 week and then take 1 tab once daily. 6)  Advair Diskus  250-50 Mcg/dose Aepb (Fluticasone-salmeterol) .... 2 puffs once daily. 7)  Carafate 1 Gm Tabs (Sucralfate) .... Take 1 tablet by mouth four times a day 8)  Pepcid 40 Mg Tabs (Famotidine) .... Take 1 tablet by mouth two times a day 9)  Zyrtec Allergy 10 Mg Tabs (Cetirizine hcl) .... Take 1 tablet by mouth once a day as needed for sinus 10)  Ciloxan 0.3 % Soln (Ciprofloxacin hcl) .... Instill 1-2 drops into the affected eye 4 times daily for 7 days.  Patient Instructions: 1)  Please, continue with eye drops for 5 more days. 2)  Call if no improvement. 3)  May return to work as of 01/31/10. 4)  Call with any questions   Prevention & Chronic Care Immunizations   Influenza vaccine: Not documented   Influenza vaccine deferral: Not available  (01/26/2010)    Tetanus booster: Not documented   Td booster deferral: Deferred  (01/26/2010)    Pneumococcal vaccine: Not documented  Other Screening   Pap smear: Not documented   Pap smear action/deferral: Deferred  (01/26/2010)   Smoking status: never  (01/30/2010)

## 2010-08-01 NOTE — Progress Notes (Signed)
Summary: swollen hand/gp  Phone Note Call from Patient   Summary of Call: Pt here today for a Depo injection.  Stated she hurt her hand/wrist  over  the weekend; it is swollen and painful to touch. We do not have any openings this morning; suggested going to  the ED  if she could not wait until tomorrow.  Pt. was told this and she preferred to go to the ED now. Initial call taken by: Chinita Pester RN,  October 03, 2009 11:08 AM

## 2010-08-01 NOTE — Assessment & Plan Note (Signed)
Summary: ACUTE-RED EYE/WHITE AND RUNNY/([facility])/[facility]   Vital Signs:  Patient profile:   30 year old female Height:      67.5 inches (171.45 cm) Weight:      177.8 pounds (80.82 kg) BMI:     27.54 Temp:     98.3 degrees F (36.83 degrees C) oral Pulse rate:   72 / minute BP sitting:   135 / 80  (right arm)  Vitals Entered By: Stanton Kidney Ditzler RN (January 26, 2010 11:08 AM) Is Patient Diabetic? No Pain Assessment Patient in pain? yes     Location: right eye Intensity: 8 Type: throbbing Onset of pain  past 2 days Nutritional Status BMI of 25 - 29 = overweight Nutritional Status Detail appetite good  Have you ever been in a relationship where you felt threatened, hurt or afraid?denies   Does patient need assistance? Functional Status Self care Ambulation Normal Comments Ck right eye - red and throbbing x 2 days.   Primary Care Provider:  Melida Quitter MD   History of Present Illness: Patient is a 30 year old female who presents to the clinic because of a red, painful right eye for the last 2 days.  She has noticed some white drainage from the eye and this morning the eye was crusted shut.  She had to use a warm wash cloth to be able to open the eye.  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  There is no change in vision.  She is a contact lense wearer and but denies any trauma to her eye or foreign particles in the eye.  She has had no sick contacts and denies fever, chills, nausea, vomiting, diarrhea, constipation, cough, or SOB.    Depression History:      The patient denies a depressed mood most of the day and a diminished interest in her usual daily activities.         Preventive Screening-Counseling & Management  Alcohol-Tobacco     Alcohol drinks/day: 0     Smoking Status: never  Caffeine-Diet-Exercise     Does Patient Exercise: yes     Type of exercise: WALKING     Exercise (avg: min/session): 30-60     Times/week: 5  Current  Medications (verified): 1)  Depo-Provera 150 Mg/ml Susp (Medroxyprogesterone Acetate) .... Q 3 Months 2)  Ventolin Hfa 108 (90 Base) Mcg/act Aers (Albuterol Sulfate) .... One Puff As Neede For [employer] of Breath 3)  Fluticasone Propionate 50 Mcg/act Susp (Fluticasone Propionate) .... 2 Sprays Into Each Nostril Once Daily 4)  Tramadol Hcl 50 Mg Tabs (Tramadol Hcl) .... Take 1 Tablet By Mouth Every 8 Hour For Pain As Needed. 5)  Pantoprazole Sodium 40 Mg Tbec (Pantoprazole Sodium) .... Take 2 Tabs Once A Day For 1 Week and Then Take 1 Tab Once Daily. 6)  Advair Diskus 250-50 Mcg/dose Aepb (Fluticasone-Salmeterol) .... 2 Puffs Once Daily. 7)  Carafate 1 Gm Tabs (Sucralfate) .... Take 1 Tablet By Mouth Four Times A Day 8)  Pepcid 40 Mg Tabs (Famotidine) .... Take 1 Tablet By Mouth Two Times A Day 9)  Zyrtec Allergy 10 Mg Tabs (Cetirizine Hcl) .... Take 1 Tablet By Mouth Once A Day As Needed For Sinus  Allergies (verified): No Known Drug Allergies  Past History:  Past medical, surgical, family and social histories (including risk factors) reviewed for relevance to current acute and chronic problems.  Past Medical History: Reviewed history from 03/28/2009 and  no changes required. Asthma GERD EROSIVE DISTAL  ESOPHAGITIS WITH LOW GRADE BLEED 9/10  Past Surgical History: Reviewed history from 03/11/2009 and no changes required. Unremarkable  Family History: Reviewed history from 03/28/2009 and no changes required. Family History of Breast Cancer: Grandmother Family History of Heart Disease: Mother No FH of Colon Cancer:  Social History: Reviewed history from 09/29/2009 and no changes required. Occupation: Works at [employer]  married Patient has never smoked.  Alcohol Use - no Daily Caffeine Use Illicit Drug Use - no  Review of Systems      See HPI  Physical Exam  Additional Exam:  General: Well developed, female in no acute distress.  Vital signs reviewed.  Head:  Atraumatic, normocephalic with no signs of trauma  Eyes: PERRLA, EOM intact, conjunctiva is red and injected on the right .  There is no foreign body that I can see.  sensitivity to light and mild pain to palpation periorbitally.  No rash noted.  Right Fundus not visualized well.  Left eye is normal. Ears: TM intact  Nose: Nares patent, mucosa is pink and moist, no polyps noted, no rash or internal vesicles seen. Mouth: Mucosa is pink and moist, dention,   Neck: Supple, full ROM, no thyromegaly or masses noted  Resp: Clear to ascultation bilaterally, no wheezes, rales, or rhonchi noted  CV: Regular rate and rhythm with no murmurs, rubs, or gallops noted  Abdomen: Soft, non-tender, non-distended with normal bowel sounds  Pulses: Radial, brachial, carotid, femoral, dorsal pedis, and posterior tibial pulses equal and symmetric     Impression & Recommendations:  Problem # 1:  CONJUNCTIVITIS, ACUTE, RIGHT (ICD-372.00) This appears to be conjunctivitis.  There is no other URI symptoms so suspect bacterial causes.  Since she is a contact lense wearer we will use Ciprofloxacin drops 2 drops into the affected eye 4 times daily for 7 days.  She is cautioned to return to the ER if the pain increases or she notices a change in her vision on the right side.  Her updated medication list for this problem includes:    Ciloxan 0.3 % Soln (Ciprofloxacin hcl) ..... Instill 1-2 drops into the affected eye 4 times daily for 7 days.  Complete Medication List: 1)  Depo-provera 150 Mg/ml Susp (Medroxyprogesterone acetate) .... Q 3 months 2)  Ventolin Hfa 108 (90 Base) Mcg/act Aers (Albuterol sulfate) .... One puff as neede for shortnes of breath 3)  Fluticasone Propionate 50 Mcg/act Susp (Fluticasone propionate) .... 2 sprays into each nostril once daily 4)  Tramadol Hcl 50 Mg Tabs (Tramadol hcl) .... Take 1 tablet by mouth every 8 hour for pain as needed. 5)  Pantoprazole Sodium 40 Mg Tbec (Pantoprazole sodium)  .... Take 2 tabs once a day for 1 week and then take 1 tab once daily. 6)  Advair Diskus 250-50 Mcg/dose Aepb (Fluticasone-salmeterol) .... 2 puffs once daily. 7)  Carafate 1 Gm Tabs (Sucralfate) .... Take 1 tablet by mouth four times a day 8)  Pepcid 40 Mg Tabs (Famotidine) .... Take 1 tablet by mouth two times a day 9)  Zyrtec Allergy 10 Mg Tabs (Cetirizine hcl) .... Take 1 tablet by mouth once a day as needed for sinus 10)  Ciloxan 0.3 % Soln (Ciprofloxacin hcl) .... Instill 1-2 drops into the affected eye 4 times daily for 7 days.  Patient Instructions: 1)  Start Ciprofloxicin eye drops 2 drops into the affected eye 4 times daily for 7 days. 2)  Return on Monday  01/30/10. 3)  Go to ER before then if you notice Decreased vision in the eye or increased pain. 4)  Do not wear your contact in that eye until you finish the drops. Prescriptions: CILOXAN 0.3 % SOLN (CIPROFLOXACIN HCL) Instill 1-2 drops into the affected eye 4 times daily for 7 days.  #5 ml x 0   Entered and Authorized by:   Leodis Sias MD   Signed by:   Leodis Sias MD on 01/26/2010   Method used:   Faxed to ...       [facility] (retail)       [address] [place], [place]  [address]       Ph: [phone]       Fax: [phone]   RxID:   [id number]    Prevention & Chronic Care Immunizations   Influenza vaccine: Not documented   Influenza vaccine deferral: Not available  (01/26/2010)    Tetanus booster: Not documented   Td booster deferral: Deferred  (01/26/2010)    Pneumococcal vaccine: Not documented  Other Screening   Pap smear: Not documented   Pap smear action/deferral: Deferred  (01/26/2010)   Smoking status: never  (01/26/2010)      Resource handout printed.

## 2010-08-01 NOTE — Assessment & Plan Note (Signed)
Summary: ddepo shot/[facility]b  Nurse Visit   Allergies: No Known Drug Allergies  Medication Administration  Injection # 1:    Medication: Depo-Provera 150mg     Diagnosis: CONTRACEPTIVE MANAGEMENT (ICD-V25.09)    Route: IM    Site: RUOQ gluteus    Exp Date: 10/2011    Lot #: [id number]    Mfr: Francisca December    Patient tolerated injection without complications    Given by: Chinita Pester RN (October 03, 2009 11:38 AM)  Orders Added: 1)  Admin of Therapeutic Inj  intramuscular or subcutaneous [96372] 2)  Depo-Provera 150mg  [J1055]

## 2010-08-01 NOTE — Assessment & Plan Note (Signed)
Summary: [facility] F/U-[facility]/CFB   Vital Signs:  Patient profile:   30 year old female Height:      67.5 inches (171.45 cm) Weight:      182.2 pounds (85.41 kg) BMI:     29.10 Temp:     98.7 degrees F (37.06 degrees C) oral Pulse rate:   80 / minute BP sitting:   124 / 85  (left arm) Cuff size:   regular  Vitals Entered By: Theotis Barrio NT II (December 06, 2009 9:15 AM) CC: URGENT CARE FOLLOW APPT / ABD PAIN / MED  REFILL ON PAIN MEDICATION - REQUEST A DIFFERENT KIND Is Patient Diabetic? No Pain Assessment Patient in pain? yes     Location: abdomen Intensity:      8 Type: CRAMPY Onset of pain  OFF/ ON FOR ABOUT 2 WEEKS Nutritional Status BMI of 25 - 29 = overweight  Have you ever been in a relationship where you felt threatened, hurt or afraid?No   Does patient need assistance? Functional Status Self care Ambulation Normal Comments URGENT CARE  FOLLOW UP APPT /  ABD PAIN   /   MED REFILL ON PAIN  MEDICATION   Primary Care Provider:  Melida Quitter MD  CC:  URGENT CARE FOLLOW APPT / ABD PAIN / MED  REFILL ON PAIN MEDICATION - REQUEST A DIFFERENT KIND.  History of Present Illness: Mrs Colbert Coyer is a 30 yo woman with PMH as outlined below.  She is here for epigastric pain for the last 2 weeks.  She has had similar episodes in the past.  Pain is worse at night when lying down.  Burning in nature and radiates up the esophagus.  She states that she was given tramadol at the [facility] but has been of no relief and would like something else.  She had a UPT negative and UDS positive for benzos and THC.  CXR was negative.  Per records, given prilosec and sucralfate.     Denies alcohol, tobacco, but does drink lots of soda.   Preventive Screening-Counseling & Management  Alcohol-Tobacco     Alcohol drinks/day: 0     Smoking Status: never  Caffeine-Diet-Exercise     Does Patient Exercise: yes     Type of exercise: WALKING     Exercise (avg: min/session): 30-60  Times/week: 5  Current Medications (verified): 1)  Depo-Provera 150 Mg/ml Susp (Medroxyprogesterone Acetate) .... Q 3 Months 2)  Ventolin Hfa 108 (90 Base) Mcg/act Aers (Albuterol Sulfate) .... One Puff As Neede For [employer] of Breath 3)  Fluticasone Propionate 50 Mcg/act Susp (Fluticasone Propionate) .... 2 Sprays Into Each Nostril Once Daily 4)  Tramadol Hcl 50 Mg Tabs (Tramadol Hcl) .... Take 1 Tablet By Mouth Every 8 Hour For Pain As Needed. 5)  Pantoprazole Sodium 40 Mg Tbec (Pantoprazole Sodium) .... Take 2 Tabs Once A Day For 1 Week and Then Take 1 Tab Once Daily. 6)  Advair Diskus 250-50 Mcg/dose Aepb (Fluticasone-Salmeterol) .... 2 Puffs Once Daily. 7)  Prilosec 40 Mg Cpdr (Omeprazole) .... Take 1 Tablet By Mouth Once A Day 8)  Carafate 1 Gm Tabs (Sucralfate) .... Take 1 Tablet By Mouth Four Times A Day  Allergies (verified): No Known Drug Allergies  Past History:  Past Medical History: Last updated: 03/28/2009 Asthma GERD EROSIVE DISTAL  ESOPHAGITIS WITH LOW GRADE BLEED 9/10  Past Surgical History: Last updated: 03/11/2009 Unremarkable  Family History: Last updated: 03/28/2009 Family History of Breast Cancer: Grandmother Family History of Heart  Disease: Mother No FH of Colon Cancer:  Social History: Last updated: 09/29/2009 Occupation: Works at [employer]  married Patient has never smoked.  Alcohol Use - no Daily Caffeine Use Illicit Drug Use - no  Risk Factors: Smoking Status: never (12/06/2009)  Review of Systems      See HPI  Physical Exam  General:  alert and well-developed.   Eyes:  vision grossly intact and pupils equal.   Neck:  supple, full ROM.   Lungs:  normal respiratory effort, no accessory muscle use, normal breath sounds, no crackles, and no wheezes.   Heart:  normal rate, regular rhythm, no murmur, no gallop, and no rub.   Abdomen:  soft, normal bowel sounds, no distention, no hepatomegaly, and no splenomegaly.  TTP over  epigastrum Extremities:  no edema Neurologic:  alert & oriented X3 and gait normal.   Psych:  Oriented X3 and memory intact for recent and remote.  labile mood   Impression & Recommendations:  Problem # 1:  REFLUX ESOPHAGITIS (ICD-530.11)  Will continue with protonix two times a day continue with sucralfate prescribed by ED Will add H2 blocker Advised to avoid cafeine and elevated head of bed Will refer to GI as I do not feel there is much else we can do and may require repeat endoscopy if symptoms truly this limiting.  spent > 30 minutes face to face  The following medications were removed from the medication list:    Dicyclomine Hcl 20 Mg Tabs (Dicyclomine hcl) .Marland Kitchen... Take one by mouth three times a day Her updated medication list for this problem includes:    Pantoprazole Sodium 40 Mg Tbec (Pantoprazole sodium) .Marland Kitchen... Take 2 tabs once a day for 1 week and then take 1 tab once daily.    Carafate 1 Gm Tabs (Sucralfate) .Marland Kitchen... Take 1 tablet by mouth four times a day    Pepcid 40 Mg Tabs (Famotidine) .Marland Kitchen... Take 1 tablet by mouth two times a day  Orders: Gastroenterology Referral (GI)  Complete Medication List: 1)  Depo-provera 150 Mg/ml Susp (Medroxyprogesterone acetate) .... Q 3 months 2)  Ventolin Hfa 108 (90 Base) Mcg/act Aers (Albuterol sulfate) .... One puff as neede for shortnes of breath 3)  Fluticasone Propionate 50 Mcg/act Susp (Fluticasone propionate) .... 2 sprays into each nostril once daily 4)  Tramadol Hcl 50 Mg Tabs (Tramadol hcl) .... Take 1 tablet by mouth every 8 hour for pain as needed. 5)  Pantoprazole Sodium 40 Mg Tbec (Pantoprazole sodium) .... Take 2 tabs once a day for 1 week and then take 1 tab once daily. 6)  Advair Diskus 250-50 Mcg/dose Aepb (Fluticasone-salmeterol) .... 2 puffs once daily. 7)  Carafate 1 Gm Tabs (Sucralfate) .... Take 1 tablet by mouth four times a day 8)  Pepcid 40 Mg Tabs (Famotidine) .... Take 1 tablet by mouth two times a  day  Patient Instructions: 1)  Please schedule a follow-up appointment in 1 month. 2)  Stop the prilosec 3)  continue the protonix two times a day and sucralfate 4)  will start pepcid two times a day 5)  continue tramadol and tylenol as needed 6)  Avoid foods high in acid (tomatoes, citrus juices, spicy foods). Avoid eating within two hours of lying down or before exercising. Do not over eat; try smaller more frequent meals. Elevate head of bed twelve inches when sleeping. Prescriptions: PEPCID 40 MG TABS (FAMOTIDINE) Take 1 tablet by mouth two times a day  #60 x 0  Entered and Authorized by:   Mariea Stable MD   Signed by:   Mariea Stable MD on 12/06/2009   Method used:   Printed then faxed to ...       [facility]       [address]       [place], [place]  [address]       Ph: [phone]       Fax: [phone]   RxID:   [id number]    Prevention & Chronic Care Immunizations   Influenza vaccine: Not documented   Influenza vaccine deferral: Refused  (09/29/2009)    Tetanus booster: Not documented    Pneumococcal vaccine: Not documented  Other Screening   Pap smear: Not documented   Pap smear action/deferral: Refused  (09/29/2009)   Smoking status: never  (12/06/2009)

## 2010-08-01 NOTE — Consult Note (Signed)
Summary: [facility]   [facility]   Imported By: Florinda Marker 08/30/2009 10:08:34  _____________________________________________________________________  External Attachment:    Type:   Image     Comment:   External Document

## 2010-08-01 NOTE — Assessment & Plan Note (Signed)
Summary: N&V, weakness since sat/pcp-sidhu/hla   Vital Signs:  Patient profile:   30 year old female Height:      67.5 inches (171.45 cm) Weight:      180.6 pounds (82.09 kg) Temp:     98.2 degrees F (36.78 degrees C) oral Pulse rate:   74 / minute Pulse (ortho):   77 / minute BP sitting:   118 / 78  (left arm) BP standing:   120 / 82 Cuff size:   regular  Vitals Entered By: [name])  Serial Vital Signs/Assessments:  Time      Position  BP       Pulse  Resp  Temp     By 3:36 Archer   Lying LA  105/71   78                    Kaye Goldston,CMA (AAMA) 3:38 Archer   Standing  120/82   77                    Kaye Goldston,CMA (AAMA)  CC: pt c/o N&V-started 4 days ago, also c/o chills, face pain  Does patient need assistance? Functional Status Self care Ambulation Normal   Primary Care [name]:  [name] Quitter MD  CC:  pt c/o N&V-started 4 days ago, also c/o chills, and face pain.  History of Present Illness: 30 y/o woman who was seen in the ER yesterday (11/1)for bacterial vaginosis and UTI, came to the clinic today because of persistent myalgia and bodyaches. She was in her ususal state of health until sunday morning when she started throwing up. She described her vomitus as yellowish, contained what she ate or drink, non bloody,nonprojectile. She also complains of some belly pain, suprapubic in location , 7/10, no radiation,aggravated with vomiting and no relieving factors. She also reports bodyaches,myalgias.  She denies vaginal discharge,pruritis, dysuria, urgency,frequency,fever.  In the ER yesterday she had a wet prep that showed clue cells and moderate WBC and was d/c home on metronidazole for 7 days.. Her UA was also  positive for moderate leucocytes and urine micoscopy showed some WBC for which she was also given bactrim for 3 days..For her nausea , she was treated with promethazine.  As per today's visit she says that her nausea and  abdominal pain is better. She is not throwing up anymore. She is trying to drink lot of fluids.  She also requested a letter for absence from her work for 2 days.   Current Medications (verified): 1)  Depo-Provera 150 Mg/ml Susp (Medroxyprogesterone Acetate) .... Q 3 Months 2)  Ventolin Hfa 108 (90 Base) Mcg/act Aers (Albuterol Sulfate) .... One Puff As Neede For [employer] of Breath 3)  Fluticasone Propionate 50 Mcg/act Susp (Fluticasone Propionate) .... 2 Sprays Into Each Nostril Once Daily 4)  Tramadol Hcl 50 Mg Tabs (Tramadol Hcl) .... Take 1 Tablet By Mouth Every 8 Hour For Pain As Needed. 5)  Pantoprazole Sodium 40 Mg Tbec (Pantoprazole Sodium) .... Take 2 Tabs Once A Day For 1 Week and Then Take 1 Tab Once Daily. 6)  Advair Diskus 250-50 Mcg/dose Aepb (Fluticasone-Salmeterol) .... 2 Puffs Once Daily. 7)  Carafate 1 Gm Tabs (Sucralfate) .... Take 1 Tablet By Mouth Four Times A Day 8)  Pepcid 40 Mg Tabs (Famotidine) .... Take 1 Tablet By Mouth Two Times A Day 9)  Zyrtec Allergy 10 Mg Tabs (Cetirizine Hcl) .... Take 1 Tablet By  Mouth Once A Day As Needed For Sinus 10)  Flagyl 500 Mg Tabs (Metronidazole) .... Take 1 Tablet By Mouth Twice Daily For 7 Days 11)  Smz-Tmp Ds 800-160 Mg Tabs (Sulfamethoxazole-Trimethoprim) .... Take 1 Tablet By Mouth Twice Daily For 3 Days. 12)  Promethazine Hcl 25 Mg Tabs (Promethazine Hcl) .... Take 1 Tablet By [employer] 6 Hours As Needed For Nausea  Allergies (verified): No Known Drug Allergies  Review of Systems      See HPI  The patient denies fever, chest pain, and dyspnea on exertion.    Physical Exam  General:  alert, well-developed, and well-nourished.  alert, well-developed, and well-nourished.   Head:  normocephalic and atraumatic.  normocephalic and atraumatic.   Eyes:  vision grossly intact, pupils equal, pupils round, and pupils reactive to light.  vision grossly intact, pupils equal, pupils round, and pupils reactive to light.   Mouth:  dry  mouth and mucous membranes.fair dentition.  fair dentition.   Abdomen:   , mild tender to palpation in the suprapubic region,soft, no guarding, no rigidity, no rebound tenderness, no hepatomegaly, and no splenomegaly.  soft, no guarding, no rigidity, no rebound tenderness, no hepatomegaly, and no splenomegaly.   Pulses:  R dorsalis pedis normal.  R dorsalis pedis normal.   Extremities:  No cyanosis , no clubbing. Neurologic:  alert & oriented X3, strength normal in all extremities, sensation intact to light touch, and gait normal.  alert & oriented X3, strength normal in all extremities, sensation intact to light touch, and gait normal.   Additional Exam:     Impression & Recommendations:  Problem # 1:  BACTERIAL VAGINITIS (ICD-616.10) Assessment New She reports suprapubic pain, nausea nad vomiting. But denies vaginal discharge or pruritis.She had wet prep in the ER yesterday that was positive for clue cells and WBC's . She was d/c home on metronidazole to complte a total course of 7 days. Agree with continuing the current regimen for her bacterial vaginosis.Marland Kitchen She was adviced to call or come back to the clinic if her symptoms persist even after completion of her antibiotics. Her updated medication list for this problem includes:    Smz-tmp Ds 800-160 Mg Tabs (Sulfamethoxazole-trimethoprim) .Marland Kitchen... Take 1 tablet by mouth twice daily for 3 days.  Problem # 2:  UTI (ICD-599.0) Assessment: New She reported N/V, suprapubic pain but denied dysuria. Her UA was positive for leucocytes  but negative for nitrites, She was d/c on bactrim and promethazine from ER . We agree with the current plan of treatment for  her UTI with 3 days of bactrim. Her updated medication list for this problem includes:    Smz-tmp Ds 800-160 Mg Tabs (Sulfamethoxazole-trimethoprim) .Marland Kitchen... Take 1 tablet by mouth twice daily for 3 days.  Complete Medication List: 1)  Depo-provera 150 Mg/ml Susp (Medroxyprogesterone acetate) .... Q  3 months 2)  Ventolin Hfa 108 (90 Base) Mcg/act Aers (Albuterol sulfate) .... One puff as neede for shortnes of breath 3)  Fluticasone Propionate 50 Mcg/act Susp (Fluticasone propionate) .... 2 sprays into each nostril once daily 4)  Tramadol Hcl 50 Mg Tabs (Tramadol hcl) .... Take 1 tablet by mouth every 8 hour for pain as needed. 5)  Pantoprazole Sodium 40 Mg Tbec (Pantoprazole sodium) .... Take 2 tabs once a day for 1 week and then take 1 tab once daily. 6)  Advair Diskus 250-50 Mcg/dose Aepb (Fluticasone-salmeterol) .... 2 puffs once daily. 7)  Carafate 1 Gm Tabs (Sucralfate) .... Take 1 tablet by mouth four  times a day 8)  Pepcid 40 Mg Tabs (Famotidine) .... Take 1 tablet by mouth two times a day 9)  Zyrtec Allergy 10 Mg Tabs (Cetirizine hcl) .... Take 1 tablet by mouth once a day as needed for sinus 10)  Smz-tmp Ds 800-160 Mg Tabs (Sulfamethoxazole-trimethoprim) .... Take 1 tablet by mouth twice daily for 3 days. 11)  Promethazine Hcl 25 Mg Tabs (Promethazine hcl) .... Take 1 tablet by mouthvery 6 hours as needed for nausea  Patient Instructions: 1)  Please take your medicines as prescribed.  2)  Please call us or come to the clinic if your symtoms ie nausea, vomiting, myalgias persist or get worse even after completing the full course of antibiotics.  3)  Please drink plenty of fluids and keep yourself hydrated.   Orders Added: 1)  Est. Patient Level III [25427]     Prevention & Chronic Care Immunizations   Influenza vaccine: Not documented   Influenza vaccine deferral: Refused  (05/03/2010)    Tetanus booster: Not documented   Td booster deferral: Refused  (05/03/2010)    Pneumococcal vaccine: Not documented  Other Screening   Pap smear: Not documented   Pap smear action/deferral: Deferred  (01/26/2010)   Smoking status: never  (01/30/2010)

## 2010-08-03 NOTE — Assessment & Plan Note (Signed)
Summary: DEPO/ SB.  Nurse Visit   Allergies: No Known Drug Allergies  Medication Administration  Injection # 1:    Medication: Depo-Provera 150mg     Diagnosis: CONTRACEPTIVE MANAGEMENT (ICD-V25.09)    Route: IM    Site: RUOQ gluteus    Exp Date: 10/30/2012    Lot #: [id number]    Mfr: GREENSTONE    Comments: NEXT INJECTION DUE MARCH 7    Patient tolerated injection without complications    Given by: Merrie Roof RN (June 14, 2010 3:25 PM)  Orders Added: 1)  Depo-Provera 150mg  [J1055] 2)  Admin of Therapeutic Inj  intramuscular or subcutaneous [78295]

## 2010-09-06 ENCOUNTER — Ambulatory Visit (INDEPENDENT_AMBULATORY_CARE_PROVIDER_SITE_OTHER): Payer: Self-pay | Admitting: *Deleted

## 2010-09-06 DIAGNOSIS — Z309 Encounter for contraceptive management, unspecified: Secondary | ICD-10-CM

## 2010-09-07 MED ORDER — MEDROXYPROGESTERONE ACETATE 150 MG/ML IM SUSP
150.0000 mg | Freq: Once | INTRAMUSCULAR | Status: AC
  Administered 2010-09-06: 150 mg via INTRAMUSCULAR

## 2010-09-12 LAB — URINALYSIS, ROUTINE W REFLEX MICROSCOPIC
Bilirubin Urine: NEGATIVE
Glucose, UA: NEGATIVE mg/dL
Ketones, ur: NEGATIVE mg/dL
Nitrite: NEGATIVE
Specific Gravity, Urine: 1.024 (ref 1.005–1.030)
pH: 8.5 — ABNORMAL HIGH (ref 5.0–8.0)

## 2010-09-12 LAB — PREGNANCY, URINE: Preg Test, Ur: NEGATIVE

## 2010-09-12 LAB — CBC
HCT: 37.6 % (ref 36.0–46.0)
Hemoglobin: 13.1 g/dL (ref 12.0–15.0)
MCV: 92.4 fL (ref 78.0–100.0)
RBC: 4.07 MIL/uL (ref 3.87–5.11)
RDW: 13.4 % (ref 11.5–15.5)
WBC: 5.9 10*3/uL (ref 4.0–10.5)

## 2010-09-12 LAB — COMPREHENSIVE METABOLIC PANEL
Albumin: 3.8 g/dL (ref 3.5–5.2)
BUN: 6 mg/dL (ref 6–23)
CO2: 23 mEq/L (ref 19–32)
Calcium: 8.6 mg/dL (ref 8.4–10.5)
Chloride: 109 mEq/L (ref 96–112)
Creatinine, Ser: 0.82 mg/dL (ref 0.4–1.2)
GFR calc Af Amer: >60 mL/min (ref >60)
GFR calc non Af Amer: >60 mL/min (ref >60)
Total Bilirubin: 0.6 mg/dL (ref 0.3–1.2)

## 2010-09-12 LAB — DIFFERENTIAL
Basophils Absolute: 0 10*3/uL (ref 0.0–0.1)
Eosinophils Relative: 4 % (ref 0–5)
Lymphocytes Relative: 42 % (ref 12–46)
Lymphs Abs: 2.5 10*3/uL (ref 0.7–4.0)
Monocytes Absolute: 0.4 10*3/uL (ref 0.1–1.0)
Monocytes Relative: 7 % (ref 3–12)
Neutro Abs: 2.7 10*3/uL (ref 1.7–7.7)

## 2010-09-12 LAB — WET PREP, GENITAL
Trich, Wet Prep: NONE SEEN
Yeast Wet Prep HPF POC: NONE SEEN

## 2010-09-12 LAB — URINE MICROSCOPIC-ADD ON

## 2010-09-14 ENCOUNTER — Telehealth: Payer: Self-pay | Admitting: *Deleted

## 2010-09-14 LAB — URINALYSIS, ROUTINE W REFLEX MICROSCOPIC
Bilirubin Urine: NEGATIVE
Hgb urine dipstick: NEGATIVE
Ketones, ur: NEGATIVE mg/dL
Specific Gravity, Urine: 1.02 (ref 1.005–1.030)
pH: 7 (ref 5.0–8.0)

## 2010-09-14 LAB — POCT PREGNANCY, URINE: Preg Test, Ur: NEGATIVE

## 2010-09-14 LAB — URINE MICROSCOPIC-ADD ON

## 2010-09-14 NOTE — Telephone Encounter (Signed)
That is fine as we continue to be short staffed.

## 2010-09-14 NOTE — Telephone Encounter (Signed)
Pt c/o migraine headache.  Onset of pain 1 month ago.  She has them early in AM and late at night. They are returning on left side.  She does not have medicaid so can not return to the " [facility]".  She was seen and treated there in the past. She is out of meds for pain.  Appointment given for Monday, will go to ED for pain relief until then

## 2010-09-15 ENCOUNTER — Emergency Department (HOSPITAL_COMMUNITY)
Admission: EM | Admit: 2010-09-15 | Discharge: 2010-09-15 | Disposition: A | Discharge status: Home / Self Care | Payer: Self-pay | Attending: Emergency Medicine | Admitting: Emergency Medicine

## 2010-09-15 DIAGNOSIS — J45909 Unspecified asthma, uncomplicated: Secondary | ICD-10-CM | POA: Insufficient documentation

## 2010-09-15 DIAGNOSIS — H53149 Visual discomfort, unspecified: Secondary | ICD-10-CM | POA: Insufficient documentation

## 2010-09-15 DIAGNOSIS — R51 Headache: Secondary | ICD-10-CM | POA: Insufficient documentation

## 2010-09-15 DIAGNOSIS — K219 Gastro-esophageal reflux disease without esophagitis: Secondary | ICD-10-CM | POA: Insufficient documentation

## 2010-09-15 LAB — CBC
MCV: 91.3 fL (ref 78.0–100.0)
Platelets: 312 10*3/uL (ref 150–400)
RDW: 13.6 % (ref 11.5–15.5)
WBC: 7.4 10*3/uL (ref 4.0–10.5)

## 2010-09-15 LAB — HEMOCCULT GUIAC POC 1CARD (OFFICE): Fecal Occult Bld: POSITIVE

## 2010-09-15 LAB — BASIC METABOLIC PANEL
BUN: 10 mg/dL (ref 6–23)
Calcium: 8.9 mg/dL (ref 8.4–10.5)
Chloride: 109 mEq/L (ref 96–112)
Creatinine, Ser: 0.79 mg/dL (ref 0.4–1.2)
GFR calc non Af Amer: >60 mL/min (ref >60)

## 2010-09-15 LAB — DIFFERENTIAL
Basophils Absolute: 0 10*3/uL (ref 0.0–0.1)
Eosinophils Relative: 3 % (ref 0–5)
Lymphocytes Relative: 45 % (ref 12–46)
Neutrophils Relative %: 45 % (ref 43–77)

## 2010-09-17 LAB — POCT RAPID STREP A (OFFICE): Streptococcus, Group A Screen (Direct): NEGATIVE

## 2010-09-18 ENCOUNTER — Ambulatory Visit (INDEPENDENT_AMBULATORY_CARE_PROVIDER_SITE_OTHER): Payer: Self-pay | Admitting: Internal Medicine

## 2010-09-18 ENCOUNTER — Encounter: Payer: Self-pay | Admitting: Internal Medicine

## 2010-09-18 VITALS — BP 134/88 | HR 111 | Temp 97.9°F | Ht 65.0 in | Wt 188.8 lb

## 2010-09-18 DIAGNOSIS — R51 Headache: Secondary | ICD-10-CM

## 2010-09-18 DIAGNOSIS — J45909 Unspecified asthma, uncomplicated: Secondary | ICD-10-CM

## 2010-09-18 DIAGNOSIS — G43909 Migraine, unspecified, not intractable, without status migrainosus: Secondary | ICD-10-CM

## 2010-09-18 LAB — RAPID URINE DRUG SCREEN, HOSP PERFORMED
Barbiturates: NOT DETECTED
Cocaine: NOT DETECTED
Opiates: NOT DETECTED

## 2010-09-18 MED ORDER — HYDROCODONE-ACETAMINOPHEN 5-500 MG PO TABS: 1.0000 | ORAL_TABLET | Freq: Four times a day (QID) | ORAL | Status: AC | PRN

## 2010-09-18 MED ORDER — ALBUTEROL SULFATE HFA 108 (90 BASE) MCG/ACT IN AERS: 2.0000 | INHALATION_SPRAY | Freq: Four times a day (QID) | RESPIRATORY_TRACT | Status: DC | PRN

## 2010-09-18 NOTE — Progress Notes (Signed)
Subjective:    Patient ID: [name], female    DOB: May 02, 1981, 30 y.o.   MRN: [id number]  HPI Patient is a 30 year old female with a past medical history of chronic migraines, irritable bowel syndrome, asthma, anemia , who presents to the clinic today for the following:  1) Migraine headaches -  Patient has a history of migraine headaches which started in 2007 and continued to note until approximately 2010.  Since 2010 the patient has remained without migraine symptoms however, over the past 3 weeks migraine headaches have returned on an almost daily basis. Patient describes left unilateral frontal headaches, and periorbital that are nonradiating, rated 8/10 in intensity, and have an insidious onset. Associated with these headaches, pt has bright flashes of orange light in left eye at onset of headache, can at times feel the headaches coming on, specifically will feel twitching of eye. HA are precipitated by heat, cold, direct bright light in eyes. Also worsened during this time of year with the weather change, and with wind blowing in her face. To alleviate the pain, pt goes into dark room, staying away from noise. Cannot quantify duration of headaches. No specific time of day is worst, no association with menstrual cycle (as pt not menstruating 2/2 Depo shot), no association with food or drink. No recent increase in life stressors.   Patient states she was previously followed at a headache clinic for these migraines, she thinks Dr. Marlis Edelson office, where she used to get some shots (unknown what kind). Was previously tried on Topomax, which was not help. Was also previously tried on Vicodin plus another medication that she cannot remember, which helped the pain. Patient confirms family history of migraines, in her mother and father.   Denies fevers, chills, vomiting, abdominal pain, diarrhea, focal weakness, slurred speech, facial droop, lid lag, watering of eyes, itchy or watery eyes, loss of  vision, loss of sensation of face. Denies recent illness or recent sick contacts with similar symptoms.   2) Asthma - well controlled, infrequently having to use albuterol rescue inhaler. No recent exacerbations. No fevers, chills, cough. Requesting refills today.   Review of Systems Per HPI.  Current Outpatient Medications Medication Sig  . albuterol (VENTOLIN HFA) 108 (90 BASE) MCG/ACT inhaler Inhale 2 puffs into the lungs every 6 (six) hours as needed.  . Fluticasone-Salmeterol (ADVAIR DISKUS) 250-50 MCG/DOSE AEPB Inhale 1 puff into the lungs every 12 (twelve) hours.    Marland Kitchen omeprazole (PRILOSEC) 40 MG capsule Take 40 mg by mouth 2 (two) times daily.     Allergies Review of patient's allergies indicates no known allergies.  Past Medical History  Diagnosis Date  . GERD (gastroesophageal reflux disease)   . Erosive esophagitis     distal esophagus, with low grade bleed 03/2009  . IBS (irritable bowel syndrome)   . Anemia   . History of migraine headaches   . Asthma     History reviewed. No pertinent past surgical history.     Objective:   Physical Exam General: Vital signs reviewed and noted. Well-developed,well-nourished,in no acute distress; alert,appropriate and cooperative throughout examination. Head: normocephalic, atraumatic. Tenderness to palpation over left frontal sinus. Ears: TM nonerythematous, not bulging, good light reflex bilaterally. Nose: Mucous membranes mildly inflamed, nonerythematous. Throat: Oropharynx nonerythematous, no exudate appreciated.  Neck: No deformities, masses, or tenderness noted. Lungs: Normal respiratory effort. Clear to auscultation BL without crackles or wheezes.  Heart: RRR. S1 and S2 normal without gallop, murmur, or rubs.  Abdomen: BS normoactive.  Soft, Nondistended, non-tender.  No masses or organomegaly. Extremities: No pretibial edema. Neurologic: CN II-XII grossly intact without focalalization or lateralization.        Assessment & Plan:  Greater than 55 minutes spent in coordinating care, and providing face-to-face instruction and care for patient.  Case and plan of care discussed with Dr. Ulyess Mort.

## 2010-09-18 NOTE — Patient Instructions (Addendum)
1) Please follow-up at the clinic in 3-4 weeks, at which time we will reevaluate migraines. 2) You have been started on new medication(s), if you develop throat closing, tongue swelling, rash, please stop the medication and call the clinic at [phone] and go to the ER. 3) Please bring all of your medications in a bag to your next visit. 4) If you are diabetic, please bring your meter to your next visit. 5) If symptoms worsen, or new symptoms arise, please call the clinic or go to the ER. 6) You have been started on a new medication that can cause drowsiness, do not drive or operate heavy machinery . Do not take this medication with alcohol.

## 2010-09-20 DIAGNOSIS — R51 Headache: Secondary | ICD-10-CM | POA: Insufficient documentation

## 2010-09-20 DIAGNOSIS — R519 Headache, unspecified: Secondary | ICD-10-CM | POA: Insufficient documentation

## 2010-09-20 NOTE — Assessment & Plan Note (Signed)
Well controlled. Continue current regimen.  - Refills given today. 

## 2010-09-20 NOTE — Assessment & Plan Note (Signed)
Likely migraine (plan described in migraine section below), although inconsistent with daily occurrence. Cluster headache also on the differential, however, pt describes insidious onset headache with flashes of light at onset, which would not be as consistent with cluster headache.

## 2010-09-20 NOTE — Assessment & Plan Note (Signed)
Called Dr. Marlis Edelson office to determine what medication was previously used, however, they did not have record of patient. Therefore, unclear what medication was previously used that worked well for patient. All she can tell me is that Topomax did not work, and that Vicodin + something else did partially work. Also, pt only has orange card, therefore very limited on migraine meds - we also called the Health Dept to see if Imitrex or Midrin were covered, of which both are not covered under orange card benefits, and pt states she cannot afford those medications at $200. - Will provide short course of Vidocin today. Patient was explained that this is only to be a short course therapy to hopefully break this cycle of headaches.  - Instructed not to drink alcohol with the Vicodin, do not drive or operate heavy machinery. Instructed to keep it locked away and away from children. - Patient instructed to return to clinic if symptoms worsen or are not improved with the Vicodin, and at that time she may benefit from referral back to headache clinic (who would be able to help guide therapy, and also perhaps provide samples of migraine meds which cannot be afforded at this time by the patient). - Counseled to continue to avoid caffeine, including chocolate (if exacerbates her symptoms), wear sunglasses, hear hoodie to protect her face, essentially to try as best as possible to filter offending agents.

## 2010-10-05 ENCOUNTER — Ambulatory Visit: Payer: Self-pay | Admitting: Internal Medicine

## 2010-10-05 ENCOUNTER — Telehealth: Payer: Self-pay | Admitting: *Deleted

## 2010-10-05 NOTE — Telephone Encounter (Signed)
Pt called with c/o headache.  She is taking vicodin without relief. Onset of headache last night.  Only on left side, throbbing pain and burning sensation. She has appointment on 4/11 Hx of migraines.  Will see today

## 2010-10-06 LAB — CBC
HCT: 31.2 % — ABNORMAL LOW (ref 36.0–46.0)
HCT: 36.2 % (ref 36.0–46.0)
Hemoglobin: 10.8 g/dL — ABNORMAL LOW (ref 12.0–15.0)
Hemoglobin: 11.5 g/dL — ABNORMAL LOW (ref 12.0–15.0)
Hemoglobin: 12.4 g/dL (ref 12.0–15.0)
MCHC: 34.1 g/dL (ref 30.0–36.0)
MCHC: 34.3 g/dL (ref 30.0–36.0)
MCHC: 34.8 g/dL (ref 30.0–36.0)
MCHC: 34.8 g/dL (ref 30.0–36.0)
MCV: 95.8 fL (ref 78.0–100.0)
MCV: 96.6 fL (ref 78.0–100.0)
MCV: 96.9 fL (ref 78.0–100.0)
Platelets: 273 10*3/uL (ref 150–400)
RBC: 3.43 MIL/uL — ABNORMAL LOW (ref 3.87–5.11)
RBC: 3.77 MIL/uL — ABNORMAL LOW (ref 3.87–5.11)
RDW: 13.4 % (ref 11.5–15.5)
RDW: 13.5 % (ref 11.5–15.5)
RDW: 13.6 % (ref 11.5–15.5)

## 2010-10-06 LAB — BASIC METABOLIC PANEL
BUN: 2 mg/dL — ABNORMAL LOW (ref 6–23)
CO2: 22 mEq/L (ref 19–32)
CO2: 24 mEq/L (ref 19–32)
Calcium: 8.9 mg/dL (ref 8.4–10.5)
Calcium: 9 mg/dL (ref 8.4–10.5)
Chloride: 111 mEq/L (ref 96–112)
Creatinine, Ser: 0.76 mg/dL (ref 0.4–1.2)
Glucose, Bld: 87 mg/dL (ref 70–99)
Glucose, Bld: 94 mg/dL (ref 70–99)
Sodium: 138 mEq/L (ref 135–145)

## 2010-10-06 LAB — HEMOCCULT GUIAC POC 1CARD (OFFICE)
Fecal Occult Bld: POSITIVE
Fecal Occult Bld: POSITIVE

## 2010-10-06 LAB — URINALYSIS, ROUTINE W REFLEX MICROSCOPIC
Bilirubin Urine: NEGATIVE
Glucose, UA: NEGATIVE mg/dL
Hgb urine dipstick: NEGATIVE
Ketones, ur: NEGATIVE mg/dL
pH: 7.5 (ref 5.0–8.0)

## 2010-10-06 LAB — COMPREHENSIVE METABOLIC PANEL
ALT: 14 U/L (ref 0–35)
Albumin: 4.1 g/dL (ref 3.5–5.2)
BUN: 15 mg/dL (ref 6–23)
BUN: 6 mg/dL (ref 6–23)
CO2: 23 mEq/L (ref 19–32)
Calcium: 9.2 mg/dL (ref 8.4–10.5)
Chloride: 109 mEq/L (ref 96–112)
Creatinine, Ser: 0.75 mg/dL (ref 0.4–1.2)
GFR calc non Af Amer: >60 mL/min (ref >60)
Glucose, Bld: 83 mg/dL (ref 70–99)
Sodium: 138 mEq/L (ref 135–145)
Total Bilirubin: 0.5 mg/dL (ref 0.3–1.2)
Total Protein: 6.9 g/dL (ref 6.0–8.3)

## 2010-10-06 LAB — TYPE AND SCREEN: ABO/RH(D): O POS

## 2010-10-06 LAB — POCT I-STAT, CHEM 8
Calcium, Ion: 1.1 mmol/L — ABNORMAL LOW (ref 1.12–1.32)
HCT: 35 % — ABNORMAL LOW (ref 36.0–46.0)
TCO2: 20 mmol/L (ref 0–100)

## 2010-10-06 LAB — LIPASE, BLOOD: Lipase: 15 U/L (ref 11–59)

## 2010-10-06 LAB — DIFFERENTIAL
Basophils Absolute: 0.1 10*3/uL (ref 0.0–0.1)
Basophils Relative: 1 % (ref 0–1)
Eosinophils Absolute: 0.2 10*3/uL (ref 0.0–0.7)
Lymphocytes Relative: 47 % — ABNORMAL HIGH (ref 12–46)
Lymphs Abs: 3.1 10*3/uL (ref 0.7–4.0)
Neutro Abs: 2.7 10*3/uL (ref 1.7–7.7)
Neutrophils Relative %: 58 % (ref 43–77)

## 2010-10-06 LAB — ABO/RH: ABO/RH(D): O POS

## 2010-10-07 ENCOUNTER — Emergency Department (HOSPITAL_COMMUNITY)
Admission: EM | Admit: 2010-10-07 | Discharge: 2010-10-07 | Disposition: A | Discharge status: Home / Self Care | Payer: Self-pay | Attending: Emergency Medicine | Admitting: Emergency Medicine

## 2010-10-07 DIAGNOSIS — K219 Gastro-esophageal reflux disease without esophagitis: Secondary | ICD-10-CM | POA: Insufficient documentation

## 2010-10-07 DIAGNOSIS — H53149 Visual discomfort, unspecified: Secondary | ICD-10-CM | POA: Insufficient documentation

## 2010-10-07 DIAGNOSIS — R51 Headache: Secondary | ICD-10-CM | POA: Insufficient documentation

## 2010-10-07 DIAGNOSIS — R110 Nausea: Secondary | ICD-10-CM | POA: Insufficient documentation

## 2010-10-07 DIAGNOSIS — J45909 Unspecified asthma, uncomplicated: Secondary | ICD-10-CM | POA: Insufficient documentation

## 2010-10-11 ENCOUNTER — Ambulatory Visit (INDEPENDENT_AMBULATORY_CARE_PROVIDER_SITE_OTHER): Payer: Self-pay | Admitting: Internal Medicine

## 2010-10-11 VITALS — BP 116/80 | HR 96 | Temp 97.3°F | Ht 67.5 in | Wt 190.0 lb

## 2010-10-11 DIAGNOSIS — G43909 Migraine, unspecified, not intractable, without status migrainosus: Secondary | ICD-10-CM

## 2010-10-11 DIAGNOSIS — J45909 Unspecified asthma, uncomplicated: Secondary | ICD-10-CM

## 2010-10-11 MED ORDER — ACETAMINOPHEN-CODEINE 300-30 MG PO TABS: 1.0000 | ORAL_TABLET | ORAL | Status: DC | PRN

## 2010-10-11 MED ORDER — DILTIAZEM HCL ER COATED BEADS 120 MG PO CP24: 120.0000 mg | ORAL_CAPSULE | Freq: Every day | ORAL | Status: DC

## 2010-10-12 LAB — CBC
Platelets: 361 10*3/uL (ref 150–400)
RBC: 4.15 MIL/uL (ref 3.87–5.11)
RDW: 13.9 % (ref 11.5–15.5)
WBC: 9.1 10*3/uL (ref 4.0–10.5)

## 2010-10-12 LAB — TSH: TSH: 0.353 u[IU]/mL (ref 0.350–4.500)

## 2010-10-16 NOTE — Assessment & Plan Note (Signed)
Lungs clear today, and despite very high pollen counts currently her symptoms are mild.  Given refill of albuterol

## 2010-10-16 NOTE — Progress Notes (Signed)
  Subjective:    Patient ID: [name], female    DOB: 29-May-1981, 30 y.o.   MRN: [id number]  HPI 30 yr old woman with a history of migraine headaches-typical symptoms are unilateral pounding headache involving the left temporal region, no aura, no associated nauseau or vomiting Cannot afford any of the triptans. Has had an uptick in frequency lately, to perhaps 3-per week. She cannot identify any triggers for this, such as change in diet, OTC medications, variation in menstrual cycle, or change in sleep/stress level. No major changes in sinus symptoms.    Review of Systems     Objective:   Physical Exam  Constitutional: She is oriented to person, place, and time. She appears well-developed and well-nourished.  Eyes: Conjunctivae and EOM are normal. Pupils are equal, round, and reactive to light.  Neck: Normal range of motion. No thyromegaly present.  Cardiovascular: Normal rate, regular rhythm and normal heart sounds.   Pulmonary/Chest: Effort normal and breath sounds normal.  Lymphadenopathy:    She has no cervical adenopathy.  Neurological: She is alert and oriented to person, place, and time. She has normal reflexes. She displays normal reflexes. No cranial nerve deficit. She exhibits normal muscle tone. Coordination normal.  Psychiatric: She has a normal mood and affect. Her behavior is normal. Judgment and thought content normal.          Assessment & Plan:

## 2010-10-16 NOTE — Assessment & Plan Note (Signed)
Trial of diltiazem for prophylaxis. She was advised that this might cause orthostasis or make her feel light-headed, and if so she should discontinue the medicine.  Tylenol #3 for pain but should use very sparingly, risk of rebound HA reviewed with her

## 2010-10-18 ENCOUNTER — Telehealth: Payer: Self-pay | Admitting: *Deleted

## 2010-10-18 NOTE — Telephone Encounter (Signed)
Message copied by Chinita Pester on Wed Oct 18, 2010 10:14 AM ------      Message from: TALBOT, DAVID      Created: Fri Oct 13, 2010 11:07 AM       Notify patient labs are normal-she no longer has anemia-slightly low blood counts-like she did on her labs last year      Her headaches are most likely uncomplicated migraines

## 2010-10-18 NOTE — Telephone Encounter (Signed)
Pt returned my call; pt was informed of nl blood test result per Dr. Reche Dixon.  Also pt stated, so far the new medication for migraines has been working.

## 2010-10-26 ENCOUNTER — Encounter: Payer: Self-pay | Admitting: Internal Medicine

## 2010-11-07 ENCOUNTER — Other Ambulatory Visit: Payer: Self-pay | Admitting: *Deleted

## 2010-11-07 MED ORDER — ACETAMINOPHEN-CODEINE 300-30 MG PO TABS: 1.0000 | ORAL_TABLET | ORAL | Status: DC | PRN

## 2010-11-10 NOTE — Telephone Encounter (Signed)
Ty#3 called to [facility]

## 2010-11-17 NOTE — Discharge Summary (Signed)
[name], MORRISETTE NO.:  [id number]   MEDICAL RECORD NO.:  [id number]          PATIENT TYPE:  OBV   LOCATION:  9304                          [facility]ACILITY:  [facility]   PHYSICIAN:  Phil D. Okey Dupre, M.D.     DATE OF BIRTH:  20-Aug-1980   DATE OF ADMISSION:  04/12/2004  DATE OF DISCHARGE:                                 DISCHARGE SUMMARY   DISCHARGE DIAGNOSES:  35.  A 30 year old gravida 2 para 1-0-0-1 at 6+ weeks with hyperemesis.  2.  Three recent emergency room visits.   DISCHARGE MEDICATIONS:  1.  Unisom 50 mg p.o. b.i.d.  2.  Vitamin B6 50 mg p.o. b.i.d.  3.  Zofran 4 mg q.6h. p.r.n. #30 no refills.  4.  Prenatal vitamin one p.o. daily.   ADMISSION HISTORY:  [name] was seen three times in the ED at [facility] and also maternity admissions for hyperemesis.  It was decided after  her third visit that she should be admitted for IV fluids.  A specific  gravity showed greater than 80 ketones and a specific gravity greater than  1.030.   Hyperemesis:  She was admitted overnight and given IV fluids with Phenergan  and Zofran.  Her emesis resolved.  She was instructed on proper eating  during hyperemesis and is being discharged on the above medication.   She should follow up at [facility] for her prenatal care.   CONDITION ON DISCHARGE:  The patient was discharged to home in stable  condition.   INSTRUCTIONS GIVEN TO PATIENT:  The patient was told of the above medical  regimen and also to make an appointment with [facility].      LC/MEDQ  D:  04/13/2004  T:  04/13/2004  Job:  [id number]   cc:   [facility] [address]

## 2010-11-17 NOTE — Discharge Summary (Signed)
NAMECHAR, FELTMAN NO.:  [id number]   MEDICAL RECORD NO.:  [id number]          PATIENT TYPE:  OBV   LOCATION:  9308                          [facility]ILITY[facility]   PHYSICIAN:  Conni Elliot, M.D.DATE OF BIRTH:  12-30-1980   DATE OF ADMISSION:  04/25/2004  DATE OF DISCHARGE:                                 DISCHARGE SUMMARY   DISCHARGE DIAGNOSES:  1.  Hyperemesis gravidarum.  2.  Intrauterine pregnancy at 8 weeks.   DISCHARGE MEDICATIONS:  1.  Phenergan suppository 25 mg one suppository per rectum q.4-6h. p.r.n.      nausea.  2.  Unisom 25 mg one tablet by mouth p.o. b.i.d.  3.  Vitamin B6 50 mg one tablet p.o. b.i.d.   FOLLOW-UP:  The patient is to call [facility] and call for an  appointment within the next 2 weeks.   PROCEDURES AND DIAGNOSTIC STUDIES:  None.   ADMISSION HISTORY AND PHYSICAL:  The patient is a 30 year old G2 P1-0-0-1 at  approximately [redacted] weeks gestation with IUP that presents for hyperemesis  gravidarum.  The patient has had multiple admissions for nausea and vomiting  during this pregnancy.  When the patient presented, she had laboratory  values consistent with dehydration with an elevated specific gravity and  ketones present in her urine.   HOSPITAL COURSE:  The patient had failed her outpatient management with  follow-up by home health.  She was admitted.  She was rehydrated with IV  fluids with lactated Ringers at 150 mL/hour.  The patient's antinausea  regimen was altered so that she would receive 25 mg of Phenergan per rectum  along with the aforementioned vitamin B6 and Unisom.  At time of discharge  the patient was stable and tolerating p.o. with this change in medication.   The patient is to be followed up by [facility]; specifically, the  patient will have vital signs taken per visit.  An IV will be placed at  home, Hep-Lock when not in use, and receiving a bolus 1 L of lactated  Ringers per day.  This is to  continue for 3 weeks.  Home health has also  been instructed to check urine for ketones and specific gravity and call if  ketones are greater than 40 or moderate, or if specific gravity is greater  than 1.020.      GSD/MEDQ  D:  04/26/2004  T:  04/26/2004  Job:  [id number]

## 2010-11-17 NOTE — Discharge Summary (Signed)
[name], [name] NO.:  [id number]   MEDICAL RECORD NO.:  [id number]          PATIENT TYPE:  INP   LOCATION:  9308                          [facility]ILITY[facility]   PHYSICIAN:  Phil D. Okey Dupre, M.D.     DATE OF BIRTH:  [birth year]   DATE OF ADMISSION:  04/19/2004  DATE OF DISCHARGE:                                 DISCHARGE SUMMARY   ADMISSION DIAGNOSES:  72.  A 30 year old gravida 2 para 1-0-0-1 at 6+ weeks with hyperemesis.  2.  Multiple maternity admissions visits and multiple hospitalizations.  3.  Trichomonas.   DISCHARGE DIAGNOSES:  20.  A 30 year old gravida 2 para 1-0-0-1 at 7 and 1 with resolved      hyperemesis.  2.  Trichomonas treated with intravenous Flagyl.   DISCHARGE MEDICATIONS:  1.  Prenatal vitamin one p.o. daily.  2.  Phenergan 25 mg PR q.6h. p.r.n.   ADMISSION HISTORY:  [name] was admitted with emesis.  She has been  admitted four previous times to [facility].   HYPEREMESIS.  Her diet was advanced as tolerated and the patient tolerated  that well.  She was given Phenergan and Zofran IV and then p.o. throughout  her hospitalization.  She was never noted to be severely dehydrated but was  given IV fluids up until the day of discharge.   CONDITION ON DISCHARGE:  The patient was discharged to home in stable  condition.   INSTRUCTIONS GIVEN TO PATIENT:  The patient was told of the above medical  regimen.  [facility] with [facility].   NOTABLE LABORATORY DATA:  Gonorrhea and chlamydia were negative and  urinalysis showed Trichomonas.      LC/MEDQ  D:  04/21/2004  T:  04/21/2004  Job:  [id number]   cc:   [facility] on [facility] [address]

## 2010-11-17 NOTE — Consult Note (Signed)
[name], [name] NO.:  [id number]   MEDICAL RECORD NO.:  [id number]                   PATIENT TYPE:  REC   LOCATION:  TPC                                  FACILITY[facility]  [facility]   PHYSICIAN:  Sondra Come, D.O.                 DATE OF BIRTH:  04-09-1981   DATE OF CONSULTATION:  03/16/2002  DATE OF DISCHARGE:                                   CONSULTATION   Dear Dr. Turner Daniels,   Thank you very much for kindly referring the [facility].  The patient was evaluated in  our clinic today.  Please refer to the following for details regarding the  history and physical examination and treatment plan.  Once again, thank you  for allowing [place] to participate in the care of the patient.   CHIEF COMPLAINT:  Low back pain and right lower extremity pain.   HISTORY OF PRESENT ILLNESS:  The patient is a pleasant, 30 year old female,  who was evaluated in a chaperoned environment.  She complains of low back  pain, radiating into her right lower extremity intermittently.  She has a  difficult time describing the location of the pain in the right lower  extremity.  Her symptoms date back to a fall on September 23, 2001, when she  fell down some steps onto her back while trying to prevent her young nephew  from falling down the stairs.  Her radicular symptoms seem to occur when her  low back pain is at its worst.  She rates her pain at a 6/10 on a subjective  scale and describes it as constant, throbbing, sharp.  Symptoms are worse  with bending, prolonged sitting, and standing and improved with rest, heat,  medications, and frequent position changes.  She has been followed by Dr.  Turner Daniels, who obtained an MRI of the lumbar spine which revealed an L3-4 disk  protrusion and an L4-5 disk bulge.  She was treated with physical therapy  which she states helped moderately.  She currently uses a TENS unit  intermittently  with some improvement.  She also continues with a home  exercise program.  She was also treated with hydrocodone, tramadol, and  Bextra.  She is still currently taking the Bextra 10 mg b.i.d. which she  states helps modestly.  The hydrocodone and tramadol did help temporarily,  but she does want to be on long-term medications if possible.  She was  evaluated by Dr. Gerlene Fee for a second surgical opinion, and he apparently  did not feel that she was a surgical candidate for lumbar surgery.  Her  function and quality of life indices have declined modestly, but she states  that she is okay on some days.  Her sleep is fair to good.  She was  previously working at [employer] but states that she took some pain  medicine one evening  and overslept and was fired.  She denies any bowel or  bladder dysfunction.  Denies fever, chills, night sweats, weight loss.  I  reviewed the health and history form and 14 point review of systems.  She  denies abdominal pain or pelvic pain.   PAST MEDICAL HISTORY:  Denies.   PAST SURGICAL HISTORY:  Denies.   FAMILY HISTORY:  Noncontributory.   SOCIAL HISTORY:  Denies smoking, alcohol use, or illicit drug use.  She is  single and not currently working.   ALLERGIES:  No known drug allergies.   MEDICATIONS:  Bextra 10 mg b.i.d.  Previous medications have included  hydrocodone, tramadol, and Flexeril.   PHYSICAL EXAMINATION:  GENERAL:  A healthy-appearing female in no acute  distress.  VITAL SIGNS:  Blood pressure is 118/69, pulse 86, respirations 16, O2  saturations 199% on room air.  The patient does have some difficulty getting  up from a chair and standing up straight.  She tends to list to the left  side.  Her pelvis is level without scoliosis.  There is mildly decreased  lumbar lordosis with tenderness to palpation bilateral lumbar paraspinal  muscles, right greater than left without acute spasm.  MANUAL MUSCLE TESTING:  5/5 bilateral lower extremities  with some give-away  weakness in the right lower extremity diffusely.  SENSORY:  Intact to light touch bilateral lower extremities in all  dermatomal distributions.  MUSCLE STRETCH REFLEXES:  2+/4 bilateral patellar, medial hamstrings, and  Achilles.  Straight leg raise is negative bilaterally but causes increased  pain in the low back.  The patient is noted to have tight hamstrings and hip  flexors.  Fadir is negative bilaterally but does cause some increased lumbar  back pain.  ABDOMEN EXAM:  Positive bowel sounds, no bruits, soft, nontender, no masses.  Negative Waddell signs.   IMPRESSION:  Degenerative disk disease of the lumbar spine with disk  protrusion at L3-4 and disk bulge at L4-5, with low back pain.  Her pain is  likely discogenic in etiology with radicular component.   PLAN:  1. I had a long discussion with the patient regarding further treatment     options.  I have instructed her to continue with her home exercise     program.  2. We will continue with Bextra 10 mg b.i.d. and have provided her with #30     sample pills.  3. We will provide her with Ultracet 1-2 p.o. t.i.d. p.r.n. #60 with one     refill.  We will also provide her with #20 sample pills.  4. I discussed interventional procedures to held decrease her pain to     include a trial of lumbar epidural steroid injections.  I discussed the     risks, benefits, limitations, alternatives, and rationale, and patient     understands and wishes to proceed in this regard.  5. The patient will return to clinic next week for a trial of epidural     steroids.   The patient was educated on the above findings and recommendations and  understands.  There were no barriers to communication.                                                Sondra Come, D.O.    JJW/MEDQ  D:  03/16/2002  T:  03/16/2002  Job:  [id number]   cc:   Feliberto Gottron. Turner Daniels, M.D. [address]  [place]  [place] [address]  Fax: [phone]

## 2010-11-17 NOTE — Consult Note (Signed)
NAME:  [name], [name] NO.:  [id number]   MEDICAL RECORD NO.:  [id number]                   PATIENT TYPE:  EMS   LOCATION:  MAJO                                 FACILITY:  [facility]   PHYSICIAN:  Angelia Mould. Derrell Lolling, M.D.             DATE OF BIRTH:  10-20-1980   DATE OF CONSULTATION:  11/04/2002  DATE OF DISCHARGE:                                   CONSULTATION   REASON FOR CONSULTATION:  Second opinion surgical consultation regarding  right lower quadrant pain.   HISTORY OF PRESENT ILLNESS:  This is a 30 year old black female who presents  to the emergency room with a one-week history of intermittent right lower  quadrant pain.  This pain is mild to moderate in character, is not  particularly related to meals or position.  She denies fever or chills.  She  really has not had any nausea and, in fact, she has been eating regular  meals every days.  No fever or chills.  She states that she is having daily  bowel movements, perhaps they are a little softer and a little greener in  color than usual.  She has been voiding normally.  She denies trauma.  Her  last menstrual period was 4/15 and was normal and on schedule.   She was evaluated by Dr. Graciela Husbands, the emergency department physician.  Lab work, ultrasound, and CT scan were fairly unremarkable.  Dr. Roger Shelter  asked for my opinion to make sure the patient did not have appendicitis.   PAST MEDICAL HISTORY:  The patient has not had any hospitalizations.  She  denies any surgical or medical problems in the past.   MEDICATIONS:  None.   DRUG ALLERGIES:  None known.   FAMILY HISTORY:  Mother living, age 104, has heart disease.  Father living,  age 1, no medical problems.  One sister and four brothers living and well.   SOCIAL HISTORY:  The patient is single.  She has a 84-year-old daughter.  She  works as a Conservation officer, nature at [employer].  Denies the use of alcohol or tobacco.   REVIEW OF SYSTEMS:  All systems were  reviewed.  They are noncontributory  except as described above.   PHYSICAL EXAMINATION:  GENERAL:  A healthy-appearing young black female in  no acute distress.  She is of normal to thin body habitus.  VITAL SIGNS:  Temperature 97.0, blood pressure 134/85, heart rate 83,  respiratory rate 20 and unlabored.  Oxygen saturation 100%.  HEENT:  Eyes:  Sclerae clear, extraocular movements intact.  Ear, nose,  mouth:  The nose, lips, tongue, and oropharynx are without gross lesions.  NECK:  Supple, nontender.  No thyroid mass.  No adenopathy.  CHEST:  Lungs clear to auscultation, no CVA tenderness.  CARDIAC:  Regular rate and rhythm, no murmur.  Heart sounds in the left  chest.  Radial, femoral, and dorsalis pedis pulses are palpable bilaterally  without edema.  ABDOMEN:  Soft, scaphoid, active bowel sounds.  There is no palpable mass,  no hernia.  She has subjective tenderness in the right lower quadrant, but  there is no guarding, no mass, no rebound.  This is very minimal discomfort  when she is distracted.  Clearly no peritoneal signs.  GENITOURINARY:  No evidence of any inguinal adenopathy or hernia.  RECTAL:  Deferred.  EXTREMITIES:  All four extremities move well, have free range of motion, no  deformity.  LYMPHATIC:  No enlarged lymph nodes in the neck or groin.  NEUROLOGIC:  No gross motor or sensory deficits.   LABORATORY DATA:  Hemoglobin 12.3, white blood cell count 7000, with a  normal differential.  Urine pregnancy test normal.  Catheterized and/or  clean catch urine looks normal.   Ultrasound according to Dr. Roger Shelter is unremarkable.  CT scan of the abdomen  and pelvis with oral and IV contrast was totally normal.  The appendix was  not  seen, but there are no inflammatory changes anywhere to suggest a focal  inflammatory process, and there is no free fluid.   ASSESSMENT:  1. Right lower quadrant pain.  History, physical findings, and lab work are     completely atypical  for acute appendicitis, and I do not think that she     needs to be operated upon or observed for that.  Differential diagnosis     would include musculoskeletal strain, viral gastroenteritis, occult     ruptured ovarian cyst at midcycle, or other more obscure process.  2. The patient looks well, and I suspect that this will be a self-limited     process.   PLAN:  1. I have advised Dr. Roger Shelter that I think the patient does not have     appendicitis and that I think it would be reasonable to discharge the     patient home with instructions to follow up at the medicine clinic should     the pain recur.  2. Repeat surgical consultation on p.r.n. basis should any surgical disease     be identified, otherwise p.r.n.                                                 Angelia Mould. Derrell Lolling, M.D.    HMI/MEDQ  D:  11/04/2002  T:  11/04/2002  Job:  [id number]   cc:   Sela Hua, M.D.

## 2010-11-17 NOTE — Consult Note (Signed)
[name], REBUCK NO.:  [id number]   MEDICAL RECORD NO.:  [id number]                   PATIENT TYPE:  REC   LOCATION:  TPC                                  FACILITY:  [facility]   PHYSICIAN:  Sondra Come, D.O.                 DATE OF BIRTH:  12/22/1980   DATE OF CONSULTATION:  03/20/2002  DATE OF DISCHARGE:                                   CONSULTATION   HISTORY OF PRESENT ILLNESS:  The patient returns to clinic today as  scheduled for a  lumbar epidural steroid injection.  She was initially seen  on March 16, 2002.  She has an L3-4 disc protrusion/herniation with an  L4-5 disc bulge with chronic low back pain and right lower extremity  radicular symptoms in an L4 distribution.  She has had some mild improvement  with the Bextra and Ultracet.  She rates her pain on a subjective scale.  Her function and quality of life indices remain declined.  She denies bowel  and bladder dysfunction or any new neurologic complaints.  I reviewed the  health and history form and 14-point review of systems.   PHYSICAL EXAMINATION:  Blood pressure 118/62, pulse 82, respiratory rate 16,  O2 saturation 97% on room air.   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   PLAN:  1. Lumbar epidural steroid injection.  2. Continue Bextra and Ultracet.  3. Paper work filled out.  4. The patient to return to clinic in two weeks for reevaluation and     possible repeat  lumbar epidural steroid injection as predicated upon     patient's symptoms and response.  Would consider a transforaminal     approach if symptoms are not improving.   PROCEDURE:  Lumbar epidural steroid injection.  The procedure was described  to the patient in detail including risks, benefits, limitations and  alternatives.  The patient wishes to  proceed.  Informed consent was  obtained.  The patient was brought back to the fluoroscopy suite and placed  on the table in prone position.  Skin was prepped and draped in the usual  sterile fashion.  Skin and subcutaneous tissues were anesthetized with 3 cc  of preservative-free 1% lidocaine.  Under direct fluoroscopic guidance, an  18-gauge 3-1/2 Tuohy needle was advanced into the right paramedian L4-5  epidural space with loss of resistance technique.  No CSF, heme, or  paresthesias were noted.  This was then injected with 1.5 cc of Kenalog  40%/cc plus 2.5 cc of normal saline with needle flush.  There were no  complications.  The patient tolerated the procedure well.  Discharge  instructions given.   The patient was educated on the above findings and recommendations and  understands.  There were no barriers to communication.  Sondra Come, D.O.    JJW/MEDQ  D:  03/20/2002  T:  03/20/2002  Job:  [id number]   cc:   Feliberto Gottron. Turner Daniels, M.D.  [address]  [place]  [place] [address]  Fax: [phone]

## 2010-11-17 NOTE — Consult Note (Signed)
NAMEHONESTI, [name] NO.:  [id number]   MEDICAL RECORD NO.:  [id number]                   PATIENT TYPE:  REC   LOCATION:  [facility]                                  FACILITY:  [facility]   PHYSICIAN:  Sondra Come, D.O.                 DATE OF BIRTH:  [birth year]   DATE OF CONSULTATION:  04/03/2002  DATE OF DISCHARGE:                                   CONSULTATION   REASON FOR CONSULTATION:  The patient returns to the clinic today as  scheduled for re-evaluation and possible second lumbar epidural steroid  injection.  She was last seen on 03/20/02, and underwent a L4-5 interlaminar  lumbar epidural steroid injection for L3 disk protrusion/disk herniation  with L4-5 disk bulge with chronic low back pain and right lower extremity  radicular symptoms in a L4 distribution.  The patient states that she had  nearly complete resolution of her symptoms until this morning when she began  having her low back and lower extremity pain.  She states that the symptoms  are in the same distribution, however, not as intense today.  Her pain today  is a 6/10 on a subjective scale.  She wishes to proceed with a second  injection.  I reviewed health and history form and 14 point review of  systems.  The patient has noted significant improvement in her function and  quality of life following injection.  She still has some room for  improvement in that regard.   PHYSICAL EXAMINATION:  GENERAL:  A healthy female in no acute distress.  VITAL SIGNS:  Blood pressure 128/59, pulse 78, respirations 20, O2  saturation 99% on room air.  NEUROLOGIC:  Manual muscle testing is 5/5 bilateral lower extremities.  Sensory examination is intact to light touch bilateral lower extremities.  Muscle stretch reflexes are 2+/4 bilateral patellar, medial hamstrings, and  Achilles.   IMPRESSION:  Degenerative disk disease of the lumbar spine with disk  protrusion/herniation at L3-4 and disk bulge at  L4-5 with right lower  extremity radiculitis/radiculopathy in a L4 distribution.   PLAN:  1. Repeat lumbar epidural steroid injection.  2. Continue Bextra and Ultracet.  3. The patient is to return to clinic in two weeks for re-evaluation and     possible repeat epidural steroid injection as predicated upon the     patient's response and symptoms.  Would consider a transforaminal     approach.   PROCEDURE:  Lumbar epidural steroid injection.   DESCRIPTION OF PROCEDURE:  The procedure was described to the patient in  detail, including risks, benefits, limitations, and alternatives.  The  patient wishes to proceed.  Informed consent was obtained.  The patient was  brought back to the fluoroscopy suite and placed on the table in the prone  position.  Skin was prepped and draped in the usual sterile fashion.  Skin  and subcutaneous tissues were anesthetized with 3  cc of preservative-free 1%  lidocaine.  Under direct fluoroscopic guidance, an 18 gauge 3.5 inch Touhy  needle was advanced into the right paramedian L4-5 epidural space with loss  of resistance technique.  No CSF, heme, or paresthesias were noted.  Needle  tip placement was further confirmed with the injection of 1 cc of Omnipaque  180, revealing an appropriate epidurogram without vascular uptake.  This was  then followed by the injection of 1.5 cc of Kenalog 40 mg per cc, plus 2.5  cc of normal saline with needle flush.  No complications.  The patient  tolerated the procedure well.  Discharge instructions were given.  The  patient was monitored and released in stable condition.   The patient was educated on the above findings and recommendations and  understands.  There were no barriers to communication.                                                 Sondra Come, D.O.    JJW/MEDQ  D:  04/03/2002  T:  04/03/2002  Job:  [id number]   cc:   [name], M.D.  [address]  [place]  [place] [address]  Fax:  [phone]

## 2010-11-17 NOTE — Consult Note (Signed)
NAME:  [name], [name] NO.:  [id number]   MEDICAL RECORD NO.:  [id number]                   PATIENT TYPE:  REC   LOCATION:  [facility]                                  FACILITY:  [facility]   PHYSICIAN:  Zachary George, DO                      DATE OF BIRTH:  [birth year]   DATE OF CONSULTATION:  07/23/2002  DATE OF DISCHARGE:                                   CONSULTATION   REASON FOR CONSULTATION:  The patient returns to clinic today for  reevaluation.  She was last seen on April 03, 2002, at which time she  underwent a second lumbar epidural steroid injection for degenerative disc  disease of the lumbar spine at L3/4 and L4/5 with lower extremity radicular  symptoms.  The patient states she did very well after the second  lumbar  epidural steroid injection until about December when she went to the  emergency room on December 17. 2003 secondary to increase in low back pain  and lower extremity radicular symptoms.  I had spoken to the P.A. at the  emergency department that day and instructed him to have the patient follow  up in our clinic.  She has counseled or has been a no-show to various  appointments since that time.  She returns to the clinic today complaining  of low back pain radiating to the bilateral lower extremities, left greater  than right at this time.  She describes her radicular symptoms in a L4/5  distribution.   In the emergency department, they gave her Oxycodone 5 mg/500 mg #16 and she  has five pills remaining from that prescription written on June 17, 2002.  She also has been taking Ultracet very rarely.  She was taking Bextra  previously which did seem to help but she has run out.  She denies any  problems with her stomach.  She has a TENS unit but she states it is broken  and she is waiting for a new one.  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  She denies any bowel or bladder dysfunction. This was a  chaperoned  evaluation.  I reviewed the health and history and 14-point  review of systems.   PHYSICAL EXAMINATION:  GENERAL:  A healthy female in no acute distress.  VITAL SIGNS:  Blood pressure is 108/57, pulse 66, respirations 20.  O2  saturation 98% on room air.  BACK:  Examination of the back reveals level pelvis without scoliosis.  There is significant tenderness to very light palpation of the lumbar  paraspinal muscles with an exaggerated pain response.  Range of motion of  the lumbar spine is guarded in all planes.  Manual muscle testing is 5/5  bilateral lower extremities.  Sensory examination is intact to light touch  bilateral lower extremities.  Muscle stretch reflexes are 2+/4, bilateral  patella, medial hamstrings, and Achilles.  Straight leg raises is negative  bilaterally.  Faber's is negative bilaterally.  The patient has tight  hamstrings and hip flexors bilaterally.   IMPRESSION:  1. Low back pain, mechanical and myofascial.  2. Degenerative disc disease of the lumbar spine with bilateral lower     extremity radicular symptoms.  3. Myofascial component.   PLAN:  1. Discuss treatment options with the patient including conservative     measures as well as interventional procedures to help her pain.  2. We will get her started back in to physical therapy for range of motion     stretching especially her lower extremities with lumbar stabilization     exercise program.  3. Mobic 7.5 mg 1 p.o. q.d. #30 with one refill.  4. Continue Ultracet 1-2 p.o. q.i.d. p.r.n. for pain not controlled with     Mobic #60 with one refill.  5. Consider repeat lumbar epidural steroid injection if symptoms are not     improving with the above measures.  6. The patient to return to the clinic in six weeks for reevaluation.   DISPOSITION:  The patient was educated in the above findings,  recommendations, and understands.  There were no barriers to communications.                                                Zachary George, DO    JW/MEDQ  D:  07/23/2002  T:  07/23/2002  Job:  [id number]

## 2010-11-28 ENCOUNTER — Ambulatory Visit (INDEPENDENT_AMBULATORY_CARE_PROVIDER_SITE_OTHER): Payer: Self-pay | Admitting: *Deleted

## 2010-11-28 DIAGNOSIS — Z309 Encounter for contraceptive management, unspecified: Secondary | ICD-10-CM

## 2010-11-28 MED ORDER — MEDROXYPROGESTERONE ACETATE 150 MG/ML IM SUSP
150.0000 mg | INTRAMUSCULAR | Status: DC
  Administered 2010-11-28 – 2011-11-16 (×4): 150 mg via INTRAMUSCULAR

## 2010-12-08 ENCOUNTER — Other Ambulatory Visit: Payer: Self-pay | Admitting: *Deleted

## 2010-12-08 MED ORDER — ACETAMINOPHEN-CODEINE 300-30 MG PO TABS: 1.0000 | ORAL_TABLET | ORAL | Status: DC | PRN

## 2010-12-08 NOTE — Telephone Encounter (Signed)
Last refill 5/8

## 2010-12-08 NOTE — Telephone Encounter (Signed)
I ran name through [facility] narc database. Only getting from [facility]. Using one pharmacy. But usage has increased greatly recently. Indicated HA were 2-3 per week and the preventative med was helping. Despite this, going through 30 Tylenol #3 in 30 days. Will refill once but insist [name] be seen before any more fills.

## 2010-12-08 NOTE — Telephone Encounter (Signed)
Rx called to [facility]; message sent to [place] for an appt.

## 2011-01-05 ENCOUNTER — Other Ambulatory Visit: Payer: Self-pay | Admitting: Internal Medicine

## 2011-01-08 NOTE — Telephone Encounter (Signed)
Rx called in 

## 2011-01-11 ENCOUNTER — Encounter: Payer: Self-pay | Admitting: Internal Medicine

## 2011-01-30 ENCOUNTER — Encounter: Payer: Self-pay | Admitting: Internal Medicine

## 2011-01-30 ENCOUNTER — Ambulatory Visit (INDEPENDENT_AMBULATORY_CARE_PROVIDER_SITE_OTHER): Payer: Self-pay | Admitting: Internal Medicine

## 2011-01-30 VITALS — BP 134/79 | HR 85 | Temp 98.9°F | Ht 67.0 in | Wt 194.0 lb

## 2011-01-30 DIAGNOSIS — G43909 Migraine, unspecified, not intractable, without status migrainosus: Secondary | ICD-10-CM

## 2011-01-30 MED ORDER — GABAPENTIN 300 MG PO CAPS: 300.0000 mg | ORAL_CAPSULE | Freq: Every day | ORAL | Status: DC

## 2011-01-30 MED ORDER — PROPRANOLOL HCL 80 MG PO TABS: 80.0000 mg | ORAL_TABLET | Freq: Three times a day (TID) | ORAL | Status: DC

## 2011-01-30 MED ORDER — ACETAMINOPHEN-CODEINE #3 300-30 MG PO TABS: 1.0000 | ORAL_TABLET | Freq: Two times a day (BID) | ORAL | Status: DC | PRN

## 2011-01-30 NOTE — Patient Instructions (Signed)
Please follow up in 3 months for PAP smear

## 2011-01-30 NOTE — Progress Notes (Signed)
  Subjective:    Patient ID: [name], female    DOB: 04/18/1981, 30 y.o.   MRN: [id number]  HPI [name] is a 30 year old woman with pmh significant for Asthma, GERD and migraine headaches that presents to the clinic for general follow-up.  1.) Headaches - C/o headaches daily, and wakes up in the morning with headache. She reports it may be related to stress, as she is trying to move into a new house this month. She reports feeling hot before onset of headache. Described as throbbing, associated with light and noise which will make it worse. Cannot afford medication directed towards migraines. Takes Tylenol 3 which controls pain well. Has never tried any medications for migraine prophylaxis.   2.) Asthma - controlled well with inhalers. Has not needed albuterol more than usual.   3.) GERD - PPI and Pepcid controlling her symptoms well. Pt denies nausea, vomiting, epigastric pain, or heartburn.   Patient has no other complaints or concerns today. She denies chest pain, cough, sob, headache, N/V, changes in abdominal and urinary character.    Review of Systems  All other systems reviewed and are negative.       Objective:   Physical Exam  Constitutional: She is oriented to person, place, and time. She appears well-developed.  HENT:  Head: Normocephalic and atraumatic.  Eyes: Pupils are equal, round, and reactive to light.  Neck: Normal range of motion. Neck supple.  Cardiovascular: Normal rate.   Pulmonary/Chest: Effort normal and breath sounds normal.  Abdominal: Soft. Bowel sounds are normal.  Musculoskeletal: Normal range of motion.  Neurological: She is alert and oriented to person, place, and time.  Psychiatric: She has a normal mood and affect. Her behavior is normal.          Assessment & Plan:   No problem-specific assessment & plan notes found for this encounter.

## 2011-01-30 NOTE — Assessment & Plan Note (Signed)
Not well controlled secondary to increased stress. Initially wanted to start prophylaxis with propranolol, however pt already on CCB for esophageal spasms. Will start pt on Gabapentin daily at bedtime. Of note, pt cannot afford Imitrex or Topamax for headaches. Will continue Tylenol 3 as needed when pt has severe headache.

## 2011-02-12 ENCOUNTER — Telehealth: Payer: Self-pay | Admitting: *Deleted

## 2011-02-12 ENCOUNTER — Emergency Department (HOSPITAL_COMMUNITY)
Admission: EM | Admit: 2011-02-12 | Discharge: 2011-02-12 | Discharge status: Against Medical Advice | Payer: Self-pay | Attending: Emergency Medicine | Admitting: Emergency Medicine

## 2011-02-12 DIAGNOSIS — M545 Low back pain, unspecified: Secondary | ICD-10-CM | POA: Insufficient documentation

## 2011-02-12 DIAGNOSIS — R209 Unspecified disturbances of skin sensation: Secondary | ICD-10-CM | POA: Insufficient documentation

## 2011-02-12 NOTE — Telephone Encounter (Signed)
Pt calls and states she has low back pain ongoing for awhile recently w/ the low back pain, R leg goes numb. She is very worried, desires appt. Will be seen 8/14

## 2011-02-13 ENCOUNTER — Ambulatory Visit (INDEPENDENT_AMBULATORY_CARE_PROVIDER_SITE_OTHER): Payer: Self-pay | Admitting: Internal Medicine

## 2011-02-13 ENCOUNTER — Encounter: Payer: Self-pay | Admitting: Internal Medicine

## 2011-02-13 VITALS — BP 125/78 | HR 83 | Temp 98.9°F | Ht 66.0 in | Wt 193.0 lb

## 2011-02-13 DIAGNOSIS — M549 Dorsalgia, unspecified: Secondary | ICD-10-CM | POA: Insufficient documentation

## 2011-02-13 MED ORDER — TRAMADOL HCL 50 MG PO TABS: 50.0000 mg | ORAL_TABLET | Freq: Four times a day (QID) | ORAL | Status: DC | PRN

## 2011-02-13 NOTE — Progress Notes (Signed)
  Subjective:    Patient ID: [name], female    DOB: [birth year], 30 y.o.   MRN: [id number]  HPI  Patient is 30 year old female with past medical history outlined below who presents to clinic with main concern of sudden onset lower back pain that started approximately 3 days ago while at work and lifting heavy objects. She describes pain as sharp, intermittent, worse when walking and improved with rest. She has not tried any over-the-counter medications but has tried applying icy hot patches with no significant relief. She denies fevers or chills, systemic symptoms, also denies urinary or fecal incontinence. She is able to ambulate with difficulty and has not been able to climb the stairs due to pain. She denies numbness and tingling, no weakness. She reports pain radiating down the buttock area down along the lateral side of the right leg and terminates around the knee area, occasionally extending to the top of the foot.  Review of Systems Per HPI    Objective:   Physical Exam  Constitutional: Vital signs reviewed.  Patient is a well-developed and well-nourished  in no acute distress and cooperative with exam. Alert and oriented x3.   Cardiovascular: RRR, S1 normal, S2 normal, no MRG, pulses symmetric and intact bilaterally Pulmonary/Chest: CTAB, no wheezes, rales, or rhonchi Abdominal: Soft. Non-tender, non-distended, bowel sounds are normal, no masses, organomegaly, or guarding present.  Musculoskeletal: No joint deformities, erythema, or stiffness, forward flexion and extension at the waist area limited due to pain, tenderness in paraspinal area lumbar and sacral to  Neurological: A&O x3, Strenght is normal and symmetric bilaterally, cranial nerve II-XII are grossly intact, no focal motor deficit, sensory intact to light touch bilaterally, straight leg raise test negative bilaterally, unable to squat or walk on tip toes due to pain, walking with a slight limp on the right side  Skin:  Warm, dry and intact. No rash, cyanosis, or clubbing.  Psychiatric: Normal mood and affect. speech and behavior is normal. Judgment and thought content normal. Cognition and memory are normal.         Assessment & Plan:

## 2011-02-13 NOTE — Assessment & Plan Note (Signed)
Most likely secondary to muscle sprain versus nerve impingement. This is secondary to improper movement and heavy weight lifting. I advised patient to avoid heavy lifting and bedrest as needed with mild physical activity and exercise. I have also advised applying either cold or warm patches on the back depending on tolerance as well as continuing tramadol as needed for pain. I have advised patient to call us back if she develops fevers and chills or other systemic symptoms or if her symptoms do not improve over the next several days.

## 2011-02-19 ENCOUNTER — Ambulatory Visit (INDEPENDENT_AMBULATORY_CARE_PROVIDER_SITE_OTHER): Payer: Self-pay | Admitting: *Deleted

## 2011-02-19 DIAGNOSIS — Z309 Encounter for contraceptive management, unspecified: Secondary | ICD-10-CM

## 2011-02-19 DIAGNOSIS — IMO0001 Reserved for inherently not codable concepts without codable children: Secondary | ICD-10-CM

## 2011-02-19 MED ORDER — MEDROXYPROGESTERONE ACETATE 150 MG/ML IM SUSP
150.0000 mg | Freq: Once | INTRAMUSCULAR | Status: AC
  Administered 2011-02-19: 150 mg via INTRAMUSCULAR

## 2011-03-09 ENCOUNTER — Other Ambulatory Visit: Payer: Self-pay | Admitting: Internal Medicine

## 2011-03-09 ENCOUNTER — Other Ambulatory Visit: Payer: Self-pay | Admitting: *Deleted

## 2011-03-09 NOTE — Telephone Encounter (Signed)
Last refill 7/31

## 2011-03-09 NOTE — Telephone Encounter (Signed)
Pt states she can not tell when headaches will restart but needs her meds for them. Scheduled for appointment Monday for UDS and pain contract.  Appointment not with PCP.

## 2011-03-12 ENCOUNTER — Encounter: Payer: Self-pay | Admitting: Internal Medicine

## 2011-03-12 ENCOUNTER — Ambulatory Visit (INDEPENDENT_AMBULATORY_CARE_PROVIDER_SITE_OTHER): Payer: Self-pay | Admitting: Internal Medicine

## 2011-03-12 VITALS — BP 130/86 | HR 78 | Temp 98.4°F | Ht 66.0 in | Wt 195.7 lb

## 2011-03-12 DIAGNOSIS — R51 Headache: Secondary | ICD-10-CM

## 2011-03-12 DIAGNOSIS — G43909 Migraine, unspecified, not intractable, without status migrainosus: Secondary | ICD-10-CM

## 2011-03-12 MED ORDER — ACETAMINOPHEN-CODEINE #3 300-30 MG PO TABS: 1.0000 | ORAL_TABLET | Freq: Two times a day (BID) | ORAL | Status: DC | PRN

## 2011-03-12 NOTE — Assessment & Plan Note (Signed)
I saw [name] to have her sign a pain contract as per Dr. Baltazar Apo. I ordered a UDS before I saw her. I reviewed the pain contract with her and she signed it. I also as per all her questions and concerns about the pain contract in detail. She voiced understanding. I asked her if she gave a urine sample and she said yes-this might be because she was in hurry to pick up her child from school bus. Please read history of present illness further details. I gave her prescription of Tylenol #3 in her hand is seeming that she gave her urine sample already- which apparently she did not Debbie called her to come by tomorrow morning or afternoon and drop by the lab to give the urine sample. She'll make an appointment as needed with her primary care.

## 2011-03-12 NOTE — Patient Instructions (Signed)
Please make a folow up appt as needed.

## 2011-03-12 NOTE — Progress Notes (Signed)
  Subjective:    Patient ID: [name], female    DOB: 10-Apr-1981, 30 y.o.   MRN: [id number]  HPI Ms. Stenglein is a 30 year old female with past with history of chronic migraine, IBS, anemia who comes in the clinic for signing the pain contract for Tylenol #3 prescription per Dr. Baltazar Apo. She says that she was told on phone to come by and signed a paper to get the prescription. She was apparently in hurry to pick up her child from school bus at 3:37, but did not know that she has to see a doctor during this visit. I reviewed the pain contract with her and she signed it and agreed to the rules. I asked her if she give a urine sample for which I already ordered a UDS before seeing her, and she said she did. So she left clinic in hurry to pick up her child. After that I confirmed with Debbie and triage nurse and lab tech, but she did not give her urine sample. I think she said yes to my question of giving the urine sample-inadvertently as she was in hurry. Otherwise she denied any new complaints of chest pain, short of breath, abdominal pain, nausea, vomiting.    Review of Systems    as per history of present illness, all systems reviewed and negative. Objective:   Physical Exam  Vitals: Reviewed and within normal limits. General: Anxious and in hurry. HEENT: PERRL, EOMI, no scleral icterus Cardiac: RRR, no rubs, murmurs or gallops Pulm: clear to auscultation bilaterally, moving normal volumes of air Abd: soft, nontender, nondistended, BS present Ext: warm and well perfused, no pedal edema Neuro: alert and oriented X3, cranial nerves II-XII grossly intact, strength and sensation to light touch equal in bilateral upper and lower extremities       Assessment & Plan:

## 2011-03-13 ENCOUNTER — Other Ambulatory Visit: Payer: Self-pay

## 2011-03-14 LAB — DRUGS OF ABUSE SCREEN W/O ALC, ROUTINE URINE
Amphetamine Screen, Ur: NEGATIVE
Benzodiazepines.: NEGATIVE
Marijuana Metabolite: NEGATIVE
Methadone: NEGATIVE
Phencyclidine (PCP): NEGATIVE

## 2011-03-23 LAB — CBC
HCT: 37.5
Hemoglobin: 12.8
MCHC: 34.1
MCV: 94.7
RBC: 3.96

## 2011-03-23 LAB — I-STAT 8, (EC8 V) (CONVERTED LAB)
BUN: 7
Bicarbonate: 22.4
Glucose, Bld: 82
TCO2: 24
pCO2, Ven: 43 — ABNORMAL LOW
pH, Ven: 7.324 — ABNORMAL HIGH

## 2011-03-23 LAB — PREGNANCY, URINE: Preg Test, Ur: NEGATIVE

## 2011-03-23 LAB — DIFFERENTIAL
Basophils Relative: 1
Eosinophils Absolute: 0.1
Eosinophils Relative: 2
Monocytes Absolute: 0.5
Monocytes Relative: 6
Neutrophils Relative %: 54

## 2011-03-23 LAB — URINALYSIS, ROUTINE W REFLEX MICROSCOPIC
Glucose, UA: NEGATIVE
Hgb urine dipstick: NEGATIVE
Protein, ur: NEGATIVE
Specific Gravity, Urine: 1.021
Urobilinogen, UA: 1

## 2011-03-23 LAB — POCT I-STAT CREATININE: Creatinine, Ser: 1

## 2011-04-02 LAB — RAPID STREP SCREEN (MED CTR MEBANE ONLY): Streptococcus, Group A Screen (Direct): NEGATIVE

## 2011-04-04 ENCOUNTER — Telehealth: Payer: Self-pay | Admitting: *Deleted

## 2011-04-04 NOTE — Telephone Encounter (Signed)
Pt ccalls c/o appr period of time this morning at work that she became dizzy, she states she sat down and about later she was fine, she states she is under a lot of stress at the present and she recently had an increase in migraine h/a's. She is advised of several options... Call back tomorrow am for a possible cancellation and be seen in clinic tomorrow or schedule an appt for next week or go to urg care this evening or to the ED if she so chooses. She states she may just wait to see if it happens again, she also is agreeable w/ the options.

## 2011-04-05 ENCOUNTER — Inpatient Hospital Stay (INDEPENDENT_AMBULATORY_CARE_PROVIDER_SITE_OTHER)
Admission: RE | Admit: 2011-04-05 | Discharge: 2011-04-05 | Disposition: A | Discharge status: Home / Self Care | Payer: Self-pay | Source: Ambulatory Visit | Attending: Emergency Medicine | Admitting: Emergency Medicine

## 2011-04-05 DIAGNOSIS — R198 Other specified symptoms and signs involving the digestive system and abdomen: Secondary | ICD-10-CM

## 2011-04-05 LAB — POCT PREGNANCY, URINE: Preg Test, Ur: NEGATIVE

## 2011-04-05 NOTE — Telephone Encounter (Signed)
Noted  

## 2011-04-10 LAB — URINALYSIS, ROUTINE W REFLEX MICROSCOPIC
Nitrite: NEGATIVE
Protein, ur: NEGATIVE
Specific Gravity, Urine: >1.03 — ABNORMAL HIGH
Urobilinogen, UA: 0.2

## 2011-04-10 LAB — WET PREP, GENITAL
Trich, Wet Prep: NONE SEEN
Yeast Wet Prep HPF POC: NONE SEEN

## 2011-04-10 LAB — POCT PREGNANCY, URINE
Operator id: 11741
Preg Test, Ur: NEGATIVE

## 2011-04-11 LAB — POCT RAPID STREP A: Streptococcus, Group A Screen (Direct): NEGATIVE

## 2011-04-12 ENCOUNTER — Inpatient Hospital Stay (INDEPENDENT_AMBULATORY_CARE_PROVIDER_SITE_OTHER)
Admission: RE | Admit: 2011-04-12 | Discharge: 2011-04-12 | Disposition: A | Discharge status: Home / Self Care | Payer: Self-pay | Source: Ambulatory Visit | Attending: Family Medicine | Admitting: Family Medicine

## 2011-04-13 ENCOUNTER — Emergency Department (HOSPITAL_COMMUNITY): Payer: Self-pay

## 2011-04-13 ENCOUNTER — Emergency Department (HOSPITAL_COMMUNITY)
Admission: EM | Admit: 2011-04-13 | Discharge: 2011-04-13 | Disposition: A | Discharge status: Home / Self Care | Payer: Self-pay | Attending: Emergency Medicine | Admitting: Emergency Medicine

## 2011-04-13 DIAGNOSIS — J45909 Unspecified asthma, uncomplicated: Secondary | ICD-10-CM | POA: Insufficient documentation

## 2011-04-13 DIAGNOSIS — M79609 Pain in unspecified limb: Secondary | ICD-10-CM | POA: Insufficient documentation

## 2011-04-13 DIAGNOSIS — K219 Gastro-esophageal reflux disease without esophagitis: Secondary | ICD-10-CM | POA: Insufficient documentation

## 2011-04-17 ENCOUNTER — Telehealth: Payer: Self-pay | Admitting: *Deleted

## 2011-04-17 NOTE — Telephone Encounter (Signed)
RTC to pt who was seen in the ER x 2 for a sprained finger.  The pt was told by the ER to see an Orthopaedic doctor.  Pt called the office and was told that the referral could not be done.  Pt was informed that she will need to call the Orthopaedic doctor that was listed on her ER sheet to make an appointment to be seen for her follow up of the ER visit.  Pt voiced understanding of and will call the office.  Angelina Ok, RN 04/17/2011 9:43 AM

## 2011-04-17 NOTE — Telephone Encounter (Signed)
Call from Dr. Jonn Shingles office.  Pt did call there office was irate because they did not schedule her with an appointment.  Pt needed to call Dr. Audrie Lia office as he was on call the first of the 2 days that she went to the ER for her sprained finger.  Call was made to Dr. Audrie Lia office and an appointment was scheduled for the pt.  Pt has an appointment scheduled for 10/17/20112 at 1:45 PM.  Pt was called and informed of and told that she should arrive 15 minutes early.  Phone number to the office was given so that pt could get directions to the office.

## 2011-04-19 ENCOUNTER — Other Ambulatory Visit: Payer: Self-pay | Admitting: Internal Medicine

## 2011-04-20 ENCOUNTER — Encounter: Payer: Self-pay | Admitting: Internal Medicine

## 2011-04-20 NOTE — Telephone Encounter (Signed)
Rx called in 

## 2011-05-11 ENCOUNTER — Ambulatory Visit: Payer: Self-pay

## 2011-05-27 ENCOUNTER — Emergency Department (HOSPITAL_COMMUNITY)
Admission: EM | Admit: 2011-05-27 | Discharge: 2011-05-27 | Disposition: A | Discharge status: Home / Self Care | Payer: Self-pay | Attending: Emergency Medicine | Admitting: Emergency Medicine

## 2011-05-27 ENCOUNTER — Encounter (HOSPITAL_COMMUNITY): Payer: Self-pay | Admitting: Emergency Medicine

## 2011-05-27 DIAGNOSIS — A499 Bacterial infection, unspecified: Secondary | ICD-10-CM | POA: Insufficient documentation

## 2011-05-27 DIAGNOSIS — R109 Unspecified abdominal pain: Secondary | ICD-10-CM | POA: Insufficient documentation

## 2011-05-27 DIAGNOSIS — N949 Unspecified condition associated with female genital organs and menstrual cycle: Secondary | ICD-10-CM | POA: Insufficient documentation

## 2011-05-27 DIAGNOSIS — J45909 Unspecified asthma, uncomplicated: Secondary | ICD-10-CM | POA: Insufficient documentation

## 2011-05-27 DIAGNOSIS — M545 Low back pain, unspecified: Secondary | ICD-10-CM | POA: Insufficient documentation

## 2011-05-27 DIAGNOSIS — N72 Inflammatory disease of cervix uteri: Secondary | ICD-10-CM | POA: Insufficient documentation

## 2011-05-27 DIAGNOSIS — N760 Acute vaginitis: Secondary | ICD-10-CM | POA: Insufficient documentation

## 2011-05-27 DIAGNOSIS — K219 Gastro-esophageal reflux disease without esophagitis: Secondary | ICD-10-CM | POA: Insufficient documentation

## 2011-05-27 DIAGNOSIS — B9689 Other specified bacterial agents as the cause of diseases classified elsewhere: Secondary | ICD-10-CM | POA: Insufficient documentation

## 2011-05-27 DIAGNOSIS — K589 Irritable bowel syndrome without diarrhea: Secondary | ICD-10-CM | POA: Insufficient documentation

## 2011-05-27 LAB — URINALYSIS, ROUTINE W REFLEX MICROSCOPIC
Bilirubin Urine: NEGATIVE
Hgb urine dipstick: NEGATIVE
Nitrite: NEGATIVE
Protein, ur: NEGATIVE mg/dL
Specific Gravity, Urine: 1.021 (ref 1.005–1.030)
Urobilinogen, UA: 0.2 mg/dL (ref 0.0–1.0)

## 2011-05-27 LAB — WET PREP, GENITAL: Trich, Wet Prep: NONE SEEN

## 2011-05-27 LAB — PREGNANCY, URINE: Preg Test, Ur: NEGATIVE

## 2011-05-27 LAB — POCT I-STAT, CHEM 8
Chloride: 105 mEq/L (ref 96–112)
HCT: 41 % (ref 36.0–46.0)
Hemoglobin: 13.9 g/dL (ref 12.0–15.0)
Potassium: 3.6 mEq/L (ref 3.5–5.1)
Sodium: 140 mEq/L (ref 135–145)

## 2011-05-27 MED ORDER — AZITHROMYCIN 250 MG PO TABS
1000.0000 mg | ORAL_TABLET | Freq: Once | ORAL | Status: AC
  Administered 2011-05-27: 1000 mg via ORAL
  Filled 2011-05-27: qty 4

## 2011-05-27 MED ORDER — CEFTRIAXONE SODIUM 250 MG IJ SOLR
250.0000 mg | Freq: Once | INTRAMUSCULAR | Status: AC
  Administered 2011-05-27: 250 mg via INTRAMUSCULAR
  Filled 2011-05-27: qty 250

## 2011-05-27 MED ORDER — TRAMADOL HCL 50 MG PO TABS: 50.0000 mg | ORAL_TABLET | Freq: Four times a day (QID) | ORAL | Status: AC | PRN

## 2011-05-27 MED ORDER — HYDROCODONE-ACETAMINOPHEN 5-325 MG PO TABS
1.0000 | ORAL_TABLET | Freq: Once | ORAL | Status: AC
  Administered 2011-05-27: 1 via ORAL
  Filled 2011-05-27: qty 1

## 2011-05-27 MED ORDER — METRONIDAZOLE 500 MG PO TABS: 500.0000 mg | ORAL_TABLET | Freq: Two times a day (BID) | ORAL | Status: AC

## 2011-05-27 NOTE — ED Provider Notes (Signed)
Medical screening examination/treatment/procedure(s) were performed by non-physician practitioner and as supervising physician I was immediately available for consultation/collaboration. Heriberto Stmartin, MD, FACEP   Elliot Meldrum L Boneta Standre, MD 05/27/11 1535 

## 2011-05-27 NOTE — ED Provider Notes (Signed)
History     CSN: [id number] Arrival date & time: 05/27/2011 10:08 AM   First MD Initiated Contact with Patient 05/27/11 1041      Chief Complaint  Patient presents with  . Back Pain    x 3 weeks    (Consider location/radiation/quality/duration/timing/severity/associated sxs/prior treatment) Patient is a 30 y.o. female presenting with back pain. The history is provided by the patient.  Back Pain  This is a new problem. The current episode started more than 1 week ago. The problem occurs constantly. The pain is associated with no known injury. The pain is present in the lumbar spine. The quality of the pain is described as aching. The pain is at a severity of 6/10. The pain is moderate. The pain is worse during the day. Associated symptoms include abdominal pain and pelvic pain. Pertinent negatives include no chest pain, no fever, no bowel incontinence, no perianal numbness, no bladder incontinence, no dysuria, no leg pain, no paresthesias, no tingling and no weakness. She has tried nothing for the symptoms.  Pt reports in addition to back pain, states she is having breast soreness and heaviness. States having pelvic cramping, similar to her period crams. States no period. Reports getting depo shots for last 6years, however, missed this last one. Was supposed to get it on 05/21/11. States had an episode of brown discharge last week which only lasted several hours. No fever, chills, nausea, vomiting, dysuria. Admits to similar pain when she was pregnant in the past. States she  Took two home urine pregnancy tests and they were both  Negative. States last time she was pregnant, her urine pregnancy tests were always negative.  Past Medical History  Diagnosis Date  . GERD (gastroesophageal reflux disease)   . Erosive esophagitis     distal esophagus, with low grade bleed 03/2009  . IBS (irritable bowel syndrome)   . Anemia   . History of migraine headaches   . Asthma     History reviewed.  No pertinent past surgical history.  Family History  Problem Relation Age of Onset  . Heart disease Mother   . Migraines Mother   . Migraines Father   . Asthma Daughter     History  Substance Use Topics  . Smoking status: Never Smoker   . Smokeless tobacco: Never Used  . Alcohol Use: No     occasional on holidays    OB History    Grav Para Term Preterm Abortions TAB SAB Ect Mult Living                  Review of Systems  Constitutional: Negative for fever and chills.  HENT: Negative.   Eyes: Negative.   Respiratory: Negative.   Cardiovascular: Negative.  Negative for chest pain.  Gastrointestinal: Positive for abdominal pain. Negative for bowel incontinence.  Genitourinary: Positive for pelvic pain. Negative for bladder incontinence, dysuria, flank pain, vaginal bleeding and vaginal discharge.  Musculoskeletal: Positive for back pain.  Skin: Negative.   Neurological: Negative.  Negative for tingling, weakness and paresthesias.  Psychiatric/Behavioral: Negative.     Allergies  Ibuprofen  Home Medications   Current Outpatient Rx  Name Route Sig Dispense Refill  . ACETAMINOPHEN-CODEINE #3 300-30 MG PO TABS  TAKE 1 TABLET BY MOUTH TWICE DAILY AS NEEDED FOR PAIN 60 tablet 0  . ALBUTEROL SULFATE HFA 108 (90 BASE) MCG/ACT IN AERS Inhalation Inhale 2 puffs into the lungs every 6 (six) hours as needed. For shortness of breath  BP 127/81  Pulse 70  Temp(Src) 98.2 F (36.8 C) (Oral)  Resp 20  SpO2 100%  Physical Exam  Nursing note and vitals reviewed. Constitutional: She is oriented to person, place, and time. She appears well-nourished.  HENT:  Head: Normocephalic and atraumatic.  Neck: Normal range of motion. Neck supple.  Cardiovascular: Normal rate, regular rhythm and normal heart sounds.   Pulmonary/Chest: Effort normal and breath sounds normal. No respiratory distress.  Abdominal: Soft. Bowel sounds are normal. There is no tenderness.  Genitourinary:  Uterus normal. There is breast swelling and tenderness. No breast discharge or bleeding. There is no rash, tenderness or lesion on the right labia. There is no rash, tenderness or lesion on the left labia. Uterus is not tender. Cervix exhibits motion tenderness and discharge. Cervix exhibits no friability. Right adnexum displays no mass and no tenderness. Left adnexum displays no mass and no tenderness. No tenderness or bleeding around the vagina. Vaginal discharge found.       Thick white discharge  Musculoskeletal:       No back tenderness. No CVA tenderness.Normal appearing back.  Neurological: She is alert and oriented to person, place, and time.  Skin: Skin is warm and dry. No rash noted.  Psychiatric: She has a normal mood and affect.    ED Course  Procedures (including critical care time)   Pregnancy test negative. Pt treated for possible cervicitis due to CMT on exam and moderate WBCs onwet prep. Given Rocephin 250IM and zithromax 1g PO. Will treat for BV as well. VS normal. Will d/c home.   MDM          Lottie Mussel, PA 05/27/11 1205

## 2011-05-27 NOTE — ED Notes (Signed)
Reports taking "2 dollar tree pregnancy test that where negative."  Pt. Also reports that the last 2 times she was pregnant the test came back negative.

## 2011-05-27 NOTE — ED Notes (Signed)
Pt reports back pain and pain to breast tenderness x 3 weeks. Pt was due for depo on the 19th but did not receive it. Pt reports spotted for a couple of hours but then no full period.

## 2011-05-28 ENCOUNTER — Ambulatory Visit (INDEPENDENT_AMBULATORY_CARE_PROVIDER_SITE_OTHER): Payer: Self-pay | Admitting: *Deleted

## 2011-05-28 DIAGNOSIS — Z309 Encounter for contraceptive management, unspecified: Secondary | ICD-10-CM

## 2011-05-28 LAB — GC/CHLAMYDIA PROBE AMP, GENITAL: GC Probe Amp, Genital: NEGATIVE

## 2011-05-28 MED ORDER — MEDROXYPROGESTERONE ACETATE 150 MG/ML IM SUSP: 150.0000 mg | Freq: Once | INTRAMUSCULAR | Status: DC

## 2011-05-30 ENCOUNTER — Telehealth (HOSPITAL_COMMUNITY): Payer: Self-pay | Admitting: Emergency Medicine

## 2011-06-01 ENCOUNTER — Other Ambulatory Visit: Payer: Self-pay | Admitting: *Deleted

## 2011-06-04 ENCOUNTER — Other Ambulatory Visit: Payer: Self-pay | Admitting: *Deleted

## 2011-06-04 MED ORDER — ACETAMINOPHEN-CODEINE #3 300-30 MG PO TABS: 1.0000 | ORAL_TABLET | Freq: Four times a day (QID) | ORAL | Status: DC | PRN

## 2011-06-04 NOTE — Telephone Encounter (Signed)
Called to pharm 

## 2011-06-11 ENCOUNTER — Ambulatory Visit (INDEPENDENT_AMBULATORY_CARE_PROVIDER_SITE_OTHER): Payer: Self-pay | Admitting: Internal Medicine

## 2011-06-11 ENCOUNTER — Encounter: Payer: Self-pay | Admitting: Internal Medicine

## 2011-06-11 VITALS — BP 123/75 | HR 83 | Temp 97.7°F | Ht 66.0 in | Wt 198.2 lb

## 2011-06-11 DIAGNOSIS — M2141 Flat foot [pes planus] (acquired), right foot: Secondary | ICD-10-CM | POA: Insufficient documentation

## 2011-06-11 DIAGNOSIS — M2142 Flat foot [pes planus] (acquired), left foot: Secondary | ICD-10-CM

## 2011-06-11 DIAGNOSIS — M2140 Flat foot [pes planus] (acquired), unspecified foot: Secondary | ICD-10-CM

## 2011-06-11 NOTE — Progress Notes (Signed)
  Subjective:    Patient ID: [name], female    DOB: [birth year], 30 y.o.   MRN: [id number]  HPI [name] is a 30 year woman with past with history of asthma, reflux esophagitis, flat foot comes the clinic with chief complaint of anterior foot pain getting worse with past month. She has flat feet and she complains that she never had any pain until for past month or so. The pain is especially in first metatarsophalangeal joint bilaterally and other metatarsophalangeal joints also. No pain when she wakes up in the morning- no pain with first step. Pain starts usually at work when she stands for long time- and is worse when she goes back home in evening. Pain is 10 of 10 at worse- deep and sharp at times. Denies any swelling, redness of the joints. Denies any fever, chills, nausea vomiting. Also has no chest pain, short of breath, abdominal pain, diarrhea.    Review of Systems    as per history of present illness, all other systems reviewed and negative. Objective:   Physical Exam Vitals: Reviewed and stable. General: NAD HEENT: PERRL, EOMI, no scleral icterus Cardiac: S1, S2, RRR, no rubs, murmurs or gallops Pulm: clear to auscultation bilaterally, moving normal volumes of air Abd: soft, nontender, nondistended, BS present Ext: Flatfoot bilaterally. No swelling or redness of any joints in foot. Minimal tenderness to palpation of right first MTP joint. No worsening with movements. Neuro: alert and oriented X3, cranial nerves II-XII grossly intact, strength and sensation to light touch equal in bilateral upper and lower extremities        Assessment & Plan:

## 2011-06-11 NOTE — Assessment & Plan Note (Signed)
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 She says she cannot take ibuprofen or other NSAIDs due to reflux esophagitis. I would refer her to sports medicine clinic- more due to patient's persistent request- but also as she might benefit from arch support. She does not seem to have any infection or inflammation at present. I won't do any imaging studies now.

## 2011-06-11 NOTE — Patient Instructions (Signed)
Please make a followup appointment as needed. Go to the appointment with sports medicine clinic as per scheduled. Take Tylenol #3 as needed for foot pain till then.

## 2011-06-14 ENCOUNTER — Ambulatory Visit: Payer: Self-pay | Admitting: Family Medicine

## 2011-06-19 NOTE — Progress Notes (Signed)
Addended by: Neomia Dear on: 06/19/2011 07:54 AM   Modules accepted: Orders

## 2011-07-09 ENCOUNTER — Telehealth: Payer: Self-pay | Admitting: *Deleted

## 2011-07-09 MED ORDER — GABAPENTIN 300 MG PO CAPS: 300.0000 mg | ORAL_CAPSULE | Freq: Three times a day (TID) | ORAL | Status: DC

## 2011-07-09 NOTE — Telephone Encounter (Signed)
Pt is requesting a refill on Neurontin 300 mg -dispense # 30 caps last refill was 06/11/2011.  Goes to the [facility].  Angelina Ok, RN 07/09/2011 2:57 PM

## 2011-07-16 ENCOUNTER — Other Ambulatory Visit: Payer: Self-pay | Admitting: *Deleted

## 2011-07-16 NOTE — Telephone Encounter (Signed)
Inhaler refill needs to go to the [facility]. Angelina Ok, RN 07/16/2011 2:53 PM.

## 2011-07-17 MED ORDER — ACETAMINOPHEN-CODEINE #3 300-30 MG PO TABS: 1.0000 | ORAL_TABLET | Freq: Four times a day (QID) | ORAL | Status: DC | PRN

## 2011-07-17 MED ORDER — ALBUTEROL SULFATE HFA 108 (90 BASE) MCG/ACT IN AERS: 2.0000 | INHALATION_SPRAY | Freq: Four times a day (QID) | RESPIRATORY_TRACT | Status: DC | PRN

## 2011-07-21 ENCOUNTER — Emergency Department (HOSPITAL_COMMUNITY)
Admission: EM | Admit: 2011-07-21 | Discharge: 2011-07-21 | Disposition: A | Discharge status: Home / Self Care | Payer: Self-pay | Attending: Emergency Medicine | Admitting: Emergency Medicine

## 2011-07-21 ENCOUNTER — Emergency Department (HOSPITAL_COMMUNITY): Payer: Self-pay

## 2011-07-21 ENCOUNTER — Encounter (HOSPITAL_COMMUNITY): Payer: Self-pay

## 2011-07-21 DIAGNOSIS — J45909 Unspecified asthma, uncomplicated: Secondary | ICD-10-CM | POA: Insufficient documentation

## 2011-07-21 DIAGNOSIS — K589 Irritable bowel syndrome without diarrhea: Secondary | ICD-10-CM | POA: Insufficient documentation

## 2011-07-21 DIAGNOSIS — M25579 Pain in unspecified ankle and joints of unspecified foot: Secondary | ICD-10-CM | POA: Insufficient documentation

## 2011-07-21 DIAGNOSIS — K219 Gastro-esophageal reflux disease without esophagitis: Secondary | ICD-10-CM | POA: Insufficient documentation

## 2011-07-21 DIAGNOSIS — M25519 Pain in unspecified shoulder: Secondary | ICD-10-CM | POA: Insufficient documentation

## 2011-07-21 DIAGNOSIS — Z79899 Other long term (current) drug therapy: Secondary | ICD-10-CM | POA: Insufficient documentation

## 2011-07-21 DIAGNOSIS — W19XXXA Unspecified fall, initial encounter: Secondary | ICD-10-CM | POA: Insufficient documentation

## 2011-07-21 MED ORDER — TRAMADOL-ACETAMINOPHEN 37.5-325 MG PO TABS: 1.0000 | ORAL_TABLET | Freq: Four times a day (QID) | ORAL | Status: AC | PRN

## 2011-07-21 NOTE — Progress Notes (Signed)
Orthopedic Tech Progress Note Patient Details:  [name] [birth year] [id number]  Other Ortho Devices Type of Ortho Device: Postop boot;Crutches Ortho Device Location: right foot Ortho Device Interventions: Application   [name] 07/21/2011, 4:45 PM

## 2011-07-21 NOTE — ED Provider Notes (Signed)
History     CSN: [id number]  Arrival date & time 07/21/11  1329   None     Chief Complaint  Patient presents with  . Fall    (Consider location/radiation/quality/duration/timing/severity/associated sxs/prior treatment) Patient is a 31 y.o. female presenting with fall. The history is provided by the patient and a relative. No language interpreter was used.  Fall The accident occurred 1 to 2 hours ago. The fall occurred while walking. She landed on concrete. There was no blood loss. The point of impact was the right shoulder (right ankle). The pain is present in the right shoulder (right ankle). The pain is mild. She was ambulatory at the scene. There was no entrapment after the fall. There was no drug use involved in the accident. There was no alcohol use involved in the accident. Pertinent negatives include no visual change, no fever, no numbness, no abdominal pain, no bowel incontinence, no nausea, no vomiting, no hematuria, no headaches, no hearing loss, no loss of consciousness and no tingling. She has tried nothing for the symptoms.    Past Medical History  Diagnosis Date  . GERD (gastroesophageal reflux disease)   . Erosive esophagitis     distal esophagus, with low grade bleed 03/2009  . IBS (irritable bowel syndrome)   . Anemia   . History of migraine headaches   . Asthma     History reviewed. No pertinent past surgical history.  Family History  Problem Relation Age of Onset  . Heart disease Mother   . Migraines Mother   . Migraines Father   . Asthma Daughter     History  Substance Use Topics  . Smoking status: Never Smoker   . Smokeless tobacco: Never Used  . Alcohol Use: No     occasional on holidays    OB History    Grav Para Term Preterm Abortions TAB SAB Ect Mult Living                  Review of Systems  Constitutional: Negative for fever.  Gastrointestinal: Negative for nausea, vomiting, abdominal pain and bowel incontinence.  Genitourinary:  Negative for hematuria.  Neurological: Negative for tingling, loss of consciousness, numbness and headaches.  All other systems reviewed and are negative.    Allergies  Ibuprofen  Home Medications   Current Outpatient Rx  Name Route Sig Dispense Refill  . ACETAMINOPHEN-CODEINE #3 300-30 MG PO TABS Oral Take 1 tablet by mouth every 6 (six) hours as needed for pain. 90 tablet 0  . ALBUTEROL SULFATE HFA 108 (90 BASE) MCG/ACT IN AERS Inhalation Inhale 2 puffs into the lungs every 6 (six) hours as needed. For shortness of breath 1 Inhaler 6  . GABAPENTIN 300 MG PO CAPS Oral Take 1 capsule (300 mg total) by mouth 3 (three) times daily. 30 capsule 2    BP 130/79  Pulse 71  Temp(Src) 98.5 F (36.9 C) (Oral)  Resp 18  Physical Exam  Constitutional: She is oriented to person, place, and time. She appears well-developed and well-nourished.  HENT:  Head: Normocephalic and atraumatic.  Eyes: Conjunctivae and EOM are normal. Pupils are equal, round, and reactive to light.  Neck: Normal range of motion. Neck supple.  Cardiovascular: Normal rate and regular rhythm.   Pulmonary/Chest: Effort normal and breath sounds normal.  Abdominal: Soft. Bowel sounds are normal. She exhibits no distension. There is no tenderness.  Musculoskeletal: Normal range of motion.       Right shoulder: She exhibits tenderness  and pain.       Right ankle: She exhibits normal pulse. tenderness. Lateral malleolus and medial malleolus tenderness found. No AITFL, no posterior TFL, no head of 5th metatarsal and no proximal fibula tenderness found. Achilles tendon normal.       Arms:      FROM of bilateral upper and lower ext.  Pain with palpation of right ankle and shoulder. NO erythema or swelling of affected areas.  Neurological: She is alert and oriented to person, place, and time. She has normal reflexes.  Skin: Skin is warm and dry. No rash noted. No erythema.  Psychiatric: She has a normal mood and affect. Her  behavior is normal.    ED Course  Procedures (including critical care time)  Labs Reviewed - No data to display No results found.   No diagnosis found.    MDM  Pt complains of recent fall injurying right shoulder and right ankle.  No abrasions or LOC.  Pt has FROM but c/o pain.  Will order Xray of right shoulder and ankle.  Pt is driving home so will hold off on pain meds.  She is unable to take ibuprofen.  Lindley Magnus [place], [place] 07/21/11 1531

## 2011-07-21 NOTE — ED Notes (Signed)
Pt fell walking into [employer], complains of right shoulder pain and right arm pain, right ankle pain, and sts she broke a nail. sts feels sore on right thigh.

## 2011-07-21 NOTE — ED Notes (Signed)
Ortho Tech paged.

## 2011-07-22 NOTE — ED Provider Notes (Signed)
Medical screening examination/treatment/procedure(s) were performed by non-physician practitioner and as supervising physician I was immediately available for consultation/collaboration.  Cyndra Numbers, MD 07/22/11 1145

## 2011-07-23 ENCOUNTER — Emergency Department (HOSPITAL_COMMUNITY)
Admission: EM | Admit: 2011-07-23 | Discharge: 2011-07-23 | Disposition: A | Discharge status: Home / Self Care | Payer: Self-pay | Attending: Emergency Medicine | Admitting: Emergency Medicine

## 2011-07-23 ENCOUNTER — Encounter (HOSPITAL_COMMUNITY): Payer: Self-pay | Admitting: Emergency Medicine

## 2011-07-23 DIAGNOSIS — K589 Irritable bowel syndrome without diarrhea: Secondary | ICD-10-CM | POA: Insufficient documentation

## 2011-07-23 DIAGNOSIS — M546 Pain in thoracic spine: Secondary | ICD-10-CM | POA: Insufficient documentation

## 2011-07-23 DIAGNOSIS — J45909 Unspecified asthma, uncomplicated: Secondary | ICD-10-CM | POA: Insufficient documentation

## 2011-07-23 DIAGNOSIS — M25519 Pain in unspecified shoulder: Secondary | ICD-10-CM | POA: Insufficient documentation

## 2011-07-23 DIAGNOSIS — K219 Gastro-esophageal reflux disease without esophagitis: Secondary | ICD-10-CM | POA: Insufficient documentation

## 2011-07-23 DIAGNOSIS — W19XXXA Unspecified fall, initial encounter: Secondary | ICD-10-CM | POA: Insufficient documentation

## 2011-07-23 DIAGNOSIS — M25511 Pain in right shoulder: Secondary | ICD-10-CM

## 2011-07-23 MED ORDER — DIAZEPAM 5 MG PO TABS
10.0000 mg | ORAL_TABLET | Freq: Once | ORAL | Status: AC
  Administered 2011-07-23: 10 mg via ORAL
  Filled 2011-07-23: qty 2

## 2011-07-23 MED ORDER — OXYCODONE-ACETAMINOPHEN 5-325 MG PO TABS
1.0000 | ORAL_TABLET | Freq: Once | ORAL | Status: AC
  Administered 2011-07-23: 1 via ORAL
  Filled 2011-07-23: qty 1

## 2011-07-23 MED ORDER — CYCLOBENZAPRINE HCL 10 MG PO TABS: 10.0000 mg | ORAL_TABLET | Freq: Two times a day (BID) | ORAL | Status: AC | PRN

## 2011-07-23 NOTE — ED Notes (Signed)
Pt fell in [place] store bc they had water in the floor.  States her right side and right side of neck are 10/10, keep pt from sleeping. Pt was seen in ED Saturday for same problem. Meds she was given are not helping

## 2011-07-23 NOTE — ED Provider Notes (Signed)
History     CSN: [id number]  Arrival date & time 07/23/11  4098   First MD Initiated Contact with Patient 07/23/11 [phone]      Chief Complaint  Patient presents with  . Back Pain    (Consider location/radiation/quality/duration/timing/severity/associated sxs/prior treatment) HPI Patient presents with complaint of continued pain in her right shoulder after a fall 2 days ago. She was seen in the emergency department and had reassuring x-rays of her right shoulder and right ankle. She was discharged to take tramadol for pain. She states that she continues to have pain in her right shoulder and right upper back. She states that the tramadol has not helped however the last dose that she had was approximately 20 hours ago. She's had no numbness or weakness in her arms or legs. She's had no new injuries or falls. 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 There no other associated systemic symptoms. Movement and palpation make the symptoms worse. Pain is described as soreness and continuous.  Past Medical History  Diagnosis Date  . GERD (gastroesophageal reflux disease)   . Erosive esophagitis     distal esophagus, with low grade bleed 03/2009  . IBS (irritable bowel syndrome)   . Anemia   . History of migraine headaches   . Asthma     History reviewed. No pertinent past surgical history.  Family History  Problem Relation Age of Onset  . Heart disease Mother   . Migraines Mother   . Migraines Father   . Asthma Daughter     History  Substance Use Topics  . Smoking status: Never Smoker   . Smokeless tobacco: Never Used  . Alcohol Use: No     occasional on holidays    OB History    Grav Para Term Preterm Abortions TAB SAB Ect Mult Living                  Review of Systems ROS reviewed and otherwise negative except for mentioned in HPI  Allergies  Ibuprofen  Home Medications   Current Outpatient Rx  Name Route Sig  Dispense Refill  . ALBUTEROL SULFATE HFA 108 (90 BASE) MCG/ACT IN AERS Inhalation Inhale 2 puffs into the lungs every 6 (six) hours as needed. For shortness of breath 1 Inhaler 6  . TRAMADOL-ACETAMINOPHEN 37.5-325 MG PO TABS Oral Take 1 tablet by mouth every 6 (six) hours as needed for pain. 30 tablet 0  . ACETAMINOPHEN-CODEINE #3 300-30 MG PO TABS Oral Take 1 tablet by mouth every 6 (six) hours as needed for pain. 90 tablet 0  . CYCLOBENZAPRINE HCL 10 MG PO TABS Oral Take 1 tablet (10 mg total) by mouth 2 (two) times daily as needed for muscle spasms. 20 tablet 0    BP 126/76  Pulse 76  Temp(Src) 98.5 F (36.9 C) (Oral)  SpO2 99% Vitals reviewed Physical Exam Physical Examination: General appearance - alert, well appearing, and in no distress Mental status - alert, oriented to person, place, and time Neck - supple, no significant adenopathy Chest - clear to auscultation, no wheezes, rales or rhonchi, symmetric air entry Heart - normal rate, regular rhythm, normal S1, S2, no murmurs, rubs, clicks or gallops Back exam - full range of motion, no midline tenderness to palpation, no CVA tenderness Neurological - alert, oriented, normal speech, strength 5/5 in extremities x 4, sensation intact Musculoskeletal - pain with ROM of right shoulder, diffusely ttp, no swelling or  deformity Extremities - peripheral pulses normal, no pedal edema, no clubbing or cyanosis Skin - normal coloration and turgor, no rashes  ED Course  Procedures (including critical care time)  Labs Reviewed - No data to display Dg Shoulder Right  07/21/2011  *RADIOLOGY REPORT*  Clinical Data: Right shoulder pain status post fall.  RIGHT SHOULDER - 2+ VIEW  Comparison: None.  Findings: The mineralization and alignment are normal.  There is no evidence of acute fracture or dislocation.  The subacromial space is preserved.  No rib fractures are identified.  IMPRESSION: No acute osseous findings.  Original Report Authenticated  By: Gerrianne Scale, M.D.   Dg Ankle Complete Right  07/21/2011  *RADIOLOGY REPORT*  Clinical Data: Fall, right ankle pain  RIGHT ANKLE - COMPLETE 3+ VIEW  Comparison: 08/25/2009  Findings: No fracture or dislocation is seen.  The ankle mortise is intact.  The base of the fifth metatarsal is unremarkable.  Mild degenerative changes of the dorsal talonavicular joints.  Suspected pes planus deformity.  IMPRESSION: No fracture or dislocation is seen.  Original Report Authenticated By: Charline Bills, M.D.     1. Pain in right shoulder   2. Fall       MDM  Patient presents with continued pain in her right shoulder as well as pain in the right side of her neck. She has no midline cervical spine tenderness. Tenderness is only paraspinal on the right side as well as tenderness diffusely of the right shoulder and pain with range of motion of the shoulder. There is no tenderness to palpation over her clavicle. X-rays from the prior visit were reviewed and were unremarkable. Patient strongly encouraged to take the tramadol every 6 hours as prescribed. She's not able to take ibuprofen. However we'll also add a muscle relaxer to her regimen. She was given strict return precautions and patient is agreeable with this plan.        Ethelda Chick, MD 07/23/11 [phone]

## 2011-07-30 ENCOUNTER — Encounter: Payer: Self-pay | Admitting: Internal Medicine

## 2011-08-13 ENCOUNTER — Other Ambulatory Visit: Payer: Self-pay | Admitting: Internal Medicine

## 2011-08-13 NOTE — Telephone Encounter (Signed)
Rx called in to pharmacy. 

## 2011-08-27 ENCOUNTER — Ambulatory Visit (INDEPENDENT_AMBULATORY_CARE_PROVIDER_SITE_OTHER): Payer: No Typology Code available for payment source | Admitting: *Deleted

## 2011-08-27 DIAGNOSIS — Z309 Encounter for contraceptive management, unspecified: Secondary | ICD-10-CM | POA: Insufficient documentation

## 2011-09-04 ENCOUNTER — Ambulatory Visit (INDEPENDENT_AMBULATORY_CARE_PROVIDER_SITE_OTHER): Payer: No Typology Code available for payment source | Admitting: Internal Medicine

## 2011-09-04 ENCOUNTER — Encounter: Payer: Self-pay | Admitting: Internal Medicine

## 2011-09-04 DIAGNOSIS — J029 Acute pharyngitis, unspecified: Secondary | ICD-10-CM

## 2011-09-04 MED ORDER — AMOXICILLIN 500 MG PO CAPS: 500.0000 mg | ORAL_CAPSULE | Freq: Two times a day (BID) | ORAL | Status: AC

## 2011-09-04 NOTE — Progress Notes (Signed)
Subjective:     Patient ID: [name], female   DOB: [birth year], 31 y.o.   MRN: [id number]  HPI Pt presents to clinic with c/o sore throat x 1 day associated with mild headache.  Reports that it hurst to swallow and she has not eaten in 1 day.  Of note, pt daughter recently diagnosed with Strep Throat which had negative rapid test but positive culture.  Denies fever or cough.  Review of Systems  Constitutional: Negative for fever and chills.  HENT: Negative for facial swelling, rhinorrhea and sneezing.   Eyes: Negative for discharge.  Respiratory: Negative for cough and shortness of breath.   Cardiovascular: Negative for chest pain.  Neurological: Negative for dizziness.       Objective:   Physical Exam  Constitutional: She is oriented to person, place, and time. She appears well-developed and well-nourished. No distress.  HENT:  Head: Normocephalic and atraumatic.  Nose: Nose normal. No rhinorrhea. Right sinus exhibits no maxillary sinus tenderness and no frontal sinus tenderness. Left sinus exhibits no maxillary sinus tenderness and no frontal sinus tenderness.  Mouth/Throat: Mucous membranes are normal. Posterior oropharyngeal erythema present. No oropharyngeal exudate, posterior oropharyngeal edema or tonsillar abscesses.  Eyes: Conjunctivae and EOM are normal. Pupils are equal, round, and reactive to light. Left eye exhibits no discharge. No scleral icterus.  Neck: Normal range of motion. Neck supple. No thyromegaly present.  Cardiovascular: Normal rate, regular rhythm, normal heart sounds and intact distal pulses.   Pulmonary/Chest: Effort normal and breath sounds normal.  Lymphadenopathy:    She has no cervical adenopathy.  Neurological: She is alert and oriented to person, place, and time.  Skin: Skin is warm and dry. No rash noted.       Assessment:     1. Pharyngitis: possibly GAS, given recent exposure    Plan:     Rapid Test negative, Treat with Amoxicillin  empirically 500 mg bid x 10 day course Send for culture Advised to call clinic or go to ER if develop sob or difficulty swallowing saliva

## 2011-09-04 NOTE — Patient Instructions (Addendum)
It was nice to meet you today. We will call you with the result of the Strep test. Your sore throat should improve in the next couple of days. Take all of the antibiotic for the next 10 days. If your symptoms become so severe that you have trouble breathing and/or cannot swallow your saliva, please call the clinic or go to the emergency department.

## 2011-09-04 NOTE — Assessment & Plan Note (Addendum)
Reports daughter dx with Strep throat recently. Pt with mild erythema of posterior pharynx, no exudates, no coryza, no anterior cervical node swelling but pt reports tenderness to palpation and discomfort swallowing saliva.  Rapid Strep negative. Intermediate probability of GAS but given recent exposure will prescribe course of Amoxicillin 500 mg bid x 10 days.  Pt requests liquid suspension.  Note given for work to return on 09/10/10 (excused for 3 business days, pt does not work on weekends).

## 2011-09-06 LAB — CULTURE, GROUP A STREP

## 2011-09-17 ENCOUNTER — Encounter: Payer: No Typology Code available for payment source | Admitting: Internal Medicine

## 2011-09-17 ENCOUNTER — Telehealth: Payer: Self-pay | Admitting: *Deleted

## 2011-09-17 ENCOUNTER — Other Ambulatory Visit: Payer: Self-pay | Admitting: *Deleted

## 2011-09-17 MED ORDER — ACETAMINOPHEN-CODEINE #3 300-30 MG PO TABS: 1.0000 | ORAL_TABLET | Freq: Four times a day (QID) | ORAL | Status: DC | PRN

## 2011-09-17 NOTE — Telephone Encounter (Signed)
Note sent from [facility] stating pt has received 13 inhalers since 09/18/10 Last dispensed 07/17/11

## 2011-09-17 NOTE — Telephone Encounter (Signed)
Last refill 2/11

## 2011-09-17 NOTE — Telephone Encounter (Signed)
Rx called in and appointment scheduled

## 2011-09-17 NOTE — Telephone Encounter (Signed)
[facility] calls and states pt got 3 inhalers 07/17/11 and is now requesting more, 1 inhaler using it as directed around the clock which would be 8 puffs a day would last 25 days, pt states to pharmacist that she is completely out now. She is demanding more... Do you want her to have more? If so please change script. Do you want her to be seen for eval for increased usage? Please advise

## 2011-09-17 NOTE — Telephone Encounter (Signed)
i tried to call pt at both ph#'s, no answer, no vmail. i called [facility] told them to let her know when she comes or calls that she needs to be seen also sent a message to [facility]ilonb and chayes to try to schedule appts.

## 2011-10-25 ENCOUNTER — Other Ambulatory Visit: Payer: Self-pay | Admitting: *Deleted

## 2011-10-25 MED ORDER — ACETAMINOPHEN-CODEINE #3 300-30 MG PO TABS: 1.0000 | ORAL_TABLET | Freq: Four times a day (QID) | ORAL | Status: DC | PRN

## 2011-10-25 NOTE — Telephone Encounter (Signed)
Rx called in 

## 2011-10-25 NOTE — Telephone Encounter (Signed)
Last refill 3/18

## 2011-11-08 ENCOUNTER — Telehealth: Payer: Self-pay | Admitting: *Deleted

## 2011-11-08 MED ORDER — GABAPENTIN 300 MG PO CAPS: 300.0000 mg | ORAL_CAPSULE | Freq: Three times a day (TID) | ORAL | Status: DC

## 2011-11-08 MED ORDER — GABAPENTIN 300 MG PO CAPS: 300.0000 mg | ORAL_CAPSULE | Freq: Every day | ORAL | Status: DC

## 2011-11-08 NOTE — Telephone Encounter (Signed)
[facility] pharmacy needs refill on Neurontin 300mg  #30 - take 1 capsule by mouth at bedtime. Last refill 09/20/11.

## 2011-11-16 ENCOUNTER — Ambulatory Visit (INDEPENDENT_AMBULATORY_CARE_PROVIDER_SITE_OTHER): Payer: Self-pay | Admitting: *Deleted

## 2011-11-16 DIAGNOSIS — Z309 Encounter for contraceptive management, unspecified: Secondary | ICD-10-CM

## 2011-11-16 MED ORDER — MEDROXYPROGESTERONE ACETATE 150 MG/ML IM SUSP: 150.0000 mg | Freq: Once | INTRAMUSCULAR | Status: DC

## 2011-11-16 NOTE — Progress Notes (Signed)
Patient ID: [name], female   DOB: [birth year], 31 y.o.   MRN: [id number] Pt is on schedule; next injection date is Aug 8 - given to pt.

## 2011-11-27 ENCOUNTER — Other Ambulatory Visit: Payer: Self-pay | Admitting: *Deleted

## 2011-11-27 MED ORDER — ACETAMINOPHEN-CODEINE #3 300-30 MG PO TABS: 1.0000 | ORAL_TABLET | Freq: Four times a day (QID) | ORAL | Status: DC | PRN

## 2011-11-27 NOTE — Telephone Encounter (Signed)
Last refill 4/25 Pt # [phone]

## 2011-11-27 NOTE — Telephone Encounter (Signed)
Rx called in 

## 2011-12-27 ENCOUNTER — Other Ambulatory Visit: Payer: Self-pay | Admitting: *Deleted

## 2011-12-27 MED ORDER — ACETAMINOPHEN-CODEINE #3 300-30 MG PO TABS: 1.0000 | ORAL_TABLET | Freq: Two times a day (BID) | ORAL | Status: DC | PRN

## 2011-12-27 NOTE — Telephone Encounter (Signed)
Started about 12/2010 by PCP. Dr Allena Katz got pt to sing contract 03/2011 for #60. Was increased to #90 temp in Dec 2012 2/2 feet pain. Her HA have really not been addressed since 12/2010. Now has orange card. I am refilling but for #60 BID PRN as per her contract. She needs to make an appt for continuity care as her HA have not been discussed in > 1 year - all visits are acute issues.

## 2011-12-28 NOTE — Telephone Encounter (Signed)
Rx called in and front desk to make appointment

## 2012-01-11 ENCOUNTER — Other Ambulatory Visit: Payer: Self-pay | Admitting: *Deleted

## 2012-01-11 MED ORDER — ALBUTEROL SULFATE HFA 108 (90 BASE) MCG/ACT IN AERS: 2.0000 | INHALATION_SPRAY | Freq: Four times a day (QID) | RESPIRATORY_TRACT | Status: DC | PRN

## 2012-01-11 NOTE — Telephone Encounter (Signed)
Called to [facility] 

## 2012-01-24 ENCOUNTER — Encounter: Payer: Self-pay | Admitting: Internal Medicine

## 2012-01-25 ENCOUNTER — Encounter: Payer: Self-pay | Admitting: Internal Medicine

## 2012-01-25 ENCOUNTER — Ambulatory Visit (INDEPENDENT_AMBULATORY_CARE_PROVIDER_SITE_OTHER): Payer: Self-pay | Admitting: Internal Medicine

## 2012-01-25 VITALS — BP 134/78 | HR 66 | Temp 98.8°F | Ht 66.0 in | Wt 195.6 lb

## 2012-01-25 DIAGNOSIS — K589 Irritable bowel syndrome without diarrhea: Secondary | ICD-10-CM

## 2012-01-25 DIAGNOSIS — G43909 Migraine, unspecified, not intractable, without status migrainosus: Secondary | ICD-10-CM

## 2012-01-25 DIAGNOSIS — R109 Unspecified abdominal pain: Secondary | ICD-10-CM

## 2012-01-25 DIAGNOSIS — R0602 Shortness of breath: Secondary | ICD-10-CM

## 2012-01-25 DIAGNOSIS — D649 Anemia, unspecified: Secondary | ICD-10-CM

## 2012-01-25 DIAGNOSIS — J45909 Unspecified asthma, uncomplicated: Secondary | ICD-10-CM

## 2012-01-25 DIAGNOSIS — K5900 Constipation, unspecified: Secondary | ICD-10-CM

## 2012-01-25 DIAGNOSIS — R58 Hemorrhage, not elsewhere classified: Secondary | ICD-10-CM

## 2012-01-25 DIAGNOSIS — Z309 Encounter for contraceptive management, unspecified: Secondary | ICD-10-CM

## 2012-01-25 DIAGNOSIS — G43709 Chronic migraine without aura, not intractable, without status migrainosus: Secondary | ICD-10-CM

## 2012-01-25 LAB — CBC
HCT: 35.1 % — ABNORMAL LOW (ref 36.0–46.0)
Hemoglobin: 12.5 g/dL (ref 12.0–15.0)
RBC: 3.88 MIL/uL (ref 3.87–5.11)
WBC: 8 10*3/uL (ref 4.0–10.5)

## 2012-01-25 LAB — POCT URINE PREGNANCY: Preg Test, Ur: NEGATIVE

## 2012-01-25 MED ORDER — DOCUSATE SODIUM 100 MG PO CAPS: 100.0000 mg | ORAL_CAPSULE | Freq: Every day | ORAL | Status: DC | PRN

## 2012-01-25 MED ORDER — ALBUTEROL SULFATE HFA 108 (90 BASE) MCG/ACT IN AERS: 2.0000 | INHALATION_SPRAY | Freq: Four times a day (QID) | RESPIRATORY_TRACT | Status: DC | PRN

## 2012-01-25 NOTE — Patient Instructions (Addendum)
--  Call us if you have bleeding again --Avoid eating hot fries.  --Start taking Colace, a stool softener to help with your bowel movements.  --Follow up for your Depo shot appointment.

## 2012-01-26 LAB — URINALYSIS, ROUTINE W REFLEX MICROSCOPIC
Bilirubin Urine: NEGATIVE
Glucose, UA: NEGATIVE mg/dL
Leukocytes, UA: NEGATIVE
Specific Gravity, Urine: >1.03 — ABNORMAL HIGH (ref 1.005–1.030)
pH: 6 (ref 5.0–8.0)

## 2012-01-26 LAB — URINALYSIS, MICROSCOPIC ONLY: Casts: NONE SEEN

## 2012-01-28 NOTE — Progress Notes (Signed)
Agree with plan 

## 2012-01-29 DIAGNOSIS — R58 Hemorrhage, not elsewhere classified: Secondary | ICD-10-CM | POA: Insufficient documentation

## 2012-01-29 NOTE — Assessment & Plan Note (Signed)
Stable, no complaints today.

## 2012-01-29 NOTE — Assessment & Plan Note (Addendum)
Pt reports no shortness of breath. Currently not on long term asthma inhalers but has needed albuterol inhaler occasionally, will refill today. Will reassess symptoms and diagnosis during next visit.

## 2012-01-29 NOTE — Assessment & Plan Note (Signed)
Pt seen and evaluate by Dr. Arlyce Dice in 03/2009. Will reassess symptoms during next visit. Pt currently not on PPI.

## 2012-01-29 NOTE — Assessment & Plan Note (Signed)
Pt on Depo Provera contraception. LMP unknown, give LQ abdominal pain, will order urine pregnancy test though positive test unlikely.

## 2012-01-29 NOTE — Progress Notes (Signed)
  Subjective:    Patient ID: [name], female    DOB: 08/26/1980, 31 y.o.   MRN: [id number]  HPI [name] is a 31 year-old woman with PMH significant for IBS, asthma, reflux esophagitis, and anemia who comes in today for evaluation of blood in her stool. On Monday morning she was in her usual state of health when she suddenly developed abdominal cramping and had a BM with blood.  She described it as a small amount of bright red blood with no clots filling the toilet. She is usually constipated, with one BM per week and has not had a BM since Monday. She denies recent hemorrhoids, or previous bright blood in her stool. She does not recall when she had her LMP as she is currently using Depo Provera for contraception, her next scheduled dose is in mid August.   In the past, she has had dark stool that were positive for FOB. She was see by Dr. Arlyce Dice, a gastroenterologist, and had an endoscopy which revealed esophagitis with active bleeding. The bleeding was successfully cauterized and she reports never having another episode of upper GI bleeding or noticing darkened stools.     Review of Systems  Constitutional: Negative for fever, chills, diaphoresis, activity change, appetite change, fatigue and unexpected weight change.  Respiratory: Negative for cough, choking, chest tightness and shortness of breath.   Cardiovascular: Negative for chest pain.  Gastrointestinal: Positive for constipation and blood in stool. Negative for nausea, vomiting, abdominal pain, diarrhea, abdominal distention and rectal pain.  Genitourinary: Negative for dysuria, flank pain and difficulty urinating.  Neurological: Negative for dizziness, weakness, light-headedness and headaches.       Objective:   Physical Exam  Constitutional: She is oriented to person, place, and time. She appears well-developed and well-nourished. No distress.  HENT:  Head: Normocephalic and atraumatic.  Eyes: Conjunctivae are normal.  Right eye exhibits no discharge. Left eye exhibits no discharge. No scleral icterus.  Cardiovascular: Normal rate, regular rhythm, normal heart sounds and intact distal pulses.   Pulmonary/Chest: Effort normal and breath sounds normal. No respiratory distress. She has no wheezes. She has no rales. She exhibits no tenderness.  Abdominal: Soft. Bowel sounds are normal. She exhibits no distension and no mass. There is no tenderness. There is no rebound and no guarding.  Genitourinary: Guaiac negative stool.       DRE: no tears, lesions, or hemorrhoids. No bright red blood per rectum. Normal rectal sphincter tone. No compacted stool or mass in rectal vault.    Musculoskeletal: She exhibits no edema and no tenderness.  Neurological: She is alert and oriented to person, place, and time. She has normal reflexes.  Skin: Skin is warm and dry. She is not diaphoretic.  Psychiatric: She has a normal mood and affect. Her behavior is normal.          Assessment & Plan:

## 2012-01-29 NOTE — Assessment & Plan Note (Addendum)
Pt with BM once per week, could be in the spectrum of IBS, though unlikely. Will reassess GI symptoms during next visit. Advised pt to start taking colace daily, and to increase fiber intake to improve constipation.

## 2012-01-29 NOTE — Assessment & Plan Note (Addendum)
Pt with hx of bleeding esophagitis but current sign most likely lower GI bleed, if indeed blood per rectum. Stool guaiac negative. Will consider follow up with GI if reoccurs.  --Ordered CBC --Ordered urine pregnancy test --Ordered Urinalysis

## 2012-01-29 NOTE — Assessment & Plan Note (Signed)
Last Hb on 11/12 normal at 13. 9. Will recheck CBC today

## 2012-01-30 NOTE — Progress Notes (Signed)
Internal Medicine Teaching Service Attending Note Date: 01/30/2012  Patient name: [name]  Medical record number: [id number]  Date of birth: 09/01/1980    This patient has been seen and discussed Dr. Garald Braver. Please see her note for complete details. I concur with her findings, assessment, and plan.  Jonah Blue 01/30/2012, 8:59 AM

## 2012-02-04 ENCOUNTER — Other Ambulatory Visit: Payer: Self-pay | Admitting: *Deleted

## 2012-02-04 MED ORDER — ACETAMINOPHEN-CODEINE #3 300-30 MG PO TABS: 1.0000 | ORAL_TABLET | Freq: Two times a day (BID) | ORAL | Status: DC | PRN

## 2012-02-04 NOTE — Telephone Encounter (Signed)
Please sch pt appt within next

## 2012-02-04 NOTE — Telephone Encounter (Signed)
Please sch appt with Dr Garald Braver in Sep - she has openings. I am giving her two months of meds but she will not receive additional refills if she doesn't keep appt.  Dr Garald Braver you have only seen pt once so have not had time to address but when she comes in pls discuss her tylenol #3 use. Has been using #90 every month since Dec. This was not the intention (or quantity) when she was started on a contract. She has migraines and opioids are never the drug of choice for HA. Thanks

## 2012-02-04 NOTE — Telephone Encounter (Signed)
Pt will need discussion for better management/meds for headache. Will discuss during next visit.

## 2012-02-05 ENCOUNTER — Emergency Department (HOSPITAL_COMMUNITY): Payer: Self-pay

## 2012-02-05 ENCOUNTER — Encounter (HOSPITAL_COMMUNITY): Payer: Self-pay | Admitting: Emergency Medicine

## 2012-02-05 ENCOUNTER — Inpatient Hospital Stay (HOSPITAL_COMMUNITY)
Admission: EM | Admit: 2012-02-05 | Discharge: 2012-02-07 | DRG: 690 | Disposition: A | Discharge status: Home / Self Care | Payer: MEDICAID | Attending: Internal Medicine | Admitting: Internal Medicine

## 2012-02-05 DIAGNOSIS — R109 Unspecified abdominal pain: Secondary | ICD-10-CM

## 2012-02-05 DIAGNOSIS — R0602 Shortness of breath: Secondary | ICD-10-CM

## 2012-02-05 DIAGNOSIS — N390 Urinary tract infection, site not specified: Principal | ICD-10-CM | POA: Diagnosis present

## 2012-02-05 DIAGNOSIS — K589 Irritable bowel syndrome without diarrhea: Secondary | ICD-10-CM

## 2012-02-05 DIAGNOSIS — D649 Anemia, unspecified: Secondary | ICD-10-CM | POA: Diagnosis present

## 2012-02-05 DIAGNOSIS — Z79899 Other long term (current) drug therapy: Secondary | ICD-10-CM

## 2012-02-05 DIAGNOSIS — J45909 Unspecified asthma, uncomplicated: Secondary | ICD-10-CM | POA: Diagnosis present

## 2012-02-05 DIAGNOSIS — I880 Nonspecific mesenteric lymphadenitis: Secondary | ICD-10-CM | POA: Diagnosis present

## 2012-02-05 DIAGNOSIS — N12 Tubulo-interstitial nephritis, not specified as acute or chronic: Secondary | ICD-10-CM

## 2012-02-05 DIAGNOSIS — R319 Hematuria, unspecified: Secondary | ICD-10-CM

## 2012-02-05 DIAGNOSIS — K2100 Gastro-esophageal reflux disease with esophagitis, without bleeding: Secondary | ICD-10-CM | POA: Diagnosis present

## 2012-02-05 HISTORY — DX: Headache: R51

## 2012-02-05 LAB — CBC WITH DIFFERENTIAL/PLATELET
Basophils Absolute: 0 10*3/uL (ref 0.0–0.1)
Basophils Relative: 0 % (ref 0–1)
HCT: 35.1 % — ABNORMAL LOW (ref 36.0–46.0)
Lymphocytes Relative: 28 % (ref 12–46)
MCH: 31.9 pg (ref 26.0–34.0)
MCV: 92.6 fL (ref 78.0–100.0)
Monocytes Relative: 10 % (ref 3–12)
Neutro Abs: 5.5 10*3/uL (ref 1.7–7.7)
Neutrophils Relative %: 60 % (ref 43–77)
RDW: 13.6 % (ref 11.5–15.5)

## 2012-02-05 LAB — URINE MICROSCOPIC-ADD ON

## 2012-02-05 LAB — RAPID URINE DRUG SCREEN, HOSP PERFORMED
Amphetamines: NOT DETECTED
Barbiturates: NOT DETECTED
Benzodiazepines: NOT DETECTED
Tetrahydrocannabinol: POSITIVE — AB

## 2012-02-05 LAB — URINALYSIS, ROUTINE W REFLEX MICROSCOPIC
Bilirubin Urine: NEGATIVE
Glucose, UA: NEGATIVE mg/dL
Ketones, ur: NEGATIVE mg/dL
pH: 6 (ref 5.0–8.0)

## 2012-02-05 LAB — CBC
HCT: 34.7 % — ABNORMAL LOW (ref 36.0–46.0)
MCH: 31.7 pg (ref 26.0–34.0)
MCV: 91.8 fL (ref 78.0–100.0)
Platelets: 280 10*3/uL (ref 150–400)
RBC: 3.78 MIL/uL — ABNORMAL LOW (ref 3.87–5.11)

## 2012-02-05 LAB — COMPREHENSIVE METABOLIC PANEL
AST: 12 U/L (ref 0–37)
Albumin: 3.8 g/dL (ref 3.5–5.2)
Alkaline Phosphatase: 53 U/L (ref 39–117)
BUN: 9 mg/dL (ref 6–23)
CO2: 23 mEq/L (ref 19–32)
Chloride: 107 mEq/L (ref 96–112)
Potassium: 3.7 mEq/L (ref 3.5–5.1)
Total Bilirubin: 0.2 mg/dL — ABNORMAL LOW (ref 0.3–1.2)

## 2012-02-05 LAB — LACTIC ACID, PLASMA: Lactic Acid, Venous: 0.8 mmol/L (ref 0.5–2.2)

## 2012-02-05 LAB — CREATININE, SERUM: GFR calc Af Amer: >90 mL/min (ref >90)

## 2012-02-05 MED ORDER — SODIUM CHLORIDE 0.9 % IV SOLN: INTRAVENOUS | Status: AC

## 2012-02-05 MED ORDER — ONDANSETRON HCL 4 MG/2ML IJ SOLN
4.0000 mg | Freq: Three times a day (TID) | INTRAMUSCULAR | Status: DC | PRN
  Administered 2012-02-05: 4 mg via INTRAVENOUS

## 2012-02-05 MED ORDER — SODIUM CHLORIDE 0.9 % IV SOLN
Freq: Once | INTRAVENOUS | Status: AC
  Administered 2012-02-05: 06:00:00 via INTRAVENOUS

## 2012-02-05 MED ORDER — CIPROFLOXACIN IN D5W 400 MG/200ML IV SOLN
400.0000 mg | Freq: Once | INTRAVENOUS | Status: AC
  Administered 2012-02-05: 400 mg via INTRAVENOUS
  Filled 2012-02-05: qty 200

## 2012-02-05 MED ORDER — CIPROFLOXACIN IN D5W 400 MG/200ML IV SOLN
400.0000 mg | Freq: Two times a day (BID) | INTRAVENOUS | Status: DC
  Administered 2012-02-05 – 2012-02-06 (×3): 400 mg via INTRAVENOUS
  Filled 2012-02-05 (×3): qty 200

## 2012-02-05 MED ORDER — ALBUTEROL SULFATE HFA 108 (90 BASE) MCG/ACT IN AERS
2.0000 | INHALATION_SPRAY | Freq: Four times a day (QID) | RESPIRATORY_TRACT | Status: DC | PRN
  Filled 2012-02-05: qty 6.7

## 2012-02-05 MED ORDER — HYDROMORPHONE HCL PF 1 MG/ML IJ SOLN: 1.0000 mg | INTRAMUSCULAR | Status: DC | PRN

## 2012-02-05 MED ORDER — SODIUM CHLORIDE 0.9 % IV SOLN
Freq: Once | INTRAVENOUS | Status: AC
  Administered 2012-02-05: 04:00:00 via INTRAVENOUS

## 2012-02-05 MED ORDER — HYDROMORPHONE HCL PF 1 MG/ML IJ SOLN
INTRAMUSCULAR | Status: AC
  Administered 2012-02-05: 1 mg
  Filled 2012-02-05: qty 1

## 2012-02-05 MED ORDER — ONDANSETRON HCL 4 MG PO TABS: 4.0000 mg | ORAL_TABLET | Freq: Four times a day (QID) | ORAL | Status: DC | PRN

## 2012-02-05 MED ORDER — CYCLOBENZAPRINE HCL 10 MG PO TABS
5.0000 mg | ORAL_TABLET | Freq: Three times a day (TID) | ORAL | Status: DC | PRN
  Administered 2012-02-05: 5 mg via ORAL
  Filled 2012-02-05: qty 1

## 2012-02-05 MED ORDER — SODIUM CHLORIDE 0.9 % IV SOLN
INTRAVENOUS | Status: DC
  Administered 2012-02-05: 18:00:00 via INTRAVENOUS

## 2012-02-05 MED ORDER — MORPHINE SULFATE 4 MG/ML IJ SOLN
4.0000 mg | Freq: Once | INTRAMUSCULAR | Status: AC
  Administered 2012-02-05: 4 mg via INTRAVENOUS
  Filled 2012-02-05: qty 1

## 2012-02-05 MED ORDER — HYDROMORPHONE HCL PF 1 MG/ML IJ SOLN
1.0000 mg | INTRAMUSCULAR | Status: AC | PRN
  Administered 2012-02-05 (×3): 1 mg via INTRAVENOUS
  Filled 2012-02-05 (×3): qty 1

## 2012-02-05 MED ORDER — ONDANSETRON HCL 4 MG/2ML IJ SOLN
INTRAMUSCULAR | Status: AC
  Filled 2012-02-05: qty 2

## 2012-02-05 MED ORDER — ONDANSETRON HCL 4 MG/2ML IJ SOLN
4.0000 mg | Freq: Four times a day (QID) | INTRAMUSCULAR | Status: DC | PRN
  Administered 2012-02-05 – 2012-02-06 (×2): 4 mg via INTRAVENOUS
  Filled 2012-02-05 (×2): qty 2

## 2012-02-05 MED ORDER — ENOXAPARIN SODIUM 40 MG/0.4ML ~~LOC~~ SOLN
40.0000 mg | SUBCUTANEOUS | Status: DC
  Administered 2012-02-05 – 2012-02-07 (×3): 40 mg via SUBCUTANEOUS
  Filled 2012-02-05 (×3): qty 0.4

## 2012-02-05 MED ORDER — PANTOPRAZOLE SODIUM 40 MG IV SOLR
40.0000 mg | Freq: Every day | INTRAVENOUS | Status: DC
  Administered 2012-02-05 – 2012-02-06 (×2): 40 mg via INTRAVENOUS
  Filled 2012-02-05 (×2): qty 40

## 2012-02-05 MED ORDER — OXYCODONE-ACETAMINOPHEN 5-325 MG PO TABS
2.0000 | ORAL_TABLET | Freq: Once | ORAL | Status: AC
  Administered 2012-02-05: 2 via ORAL
  Filled 2012-02-05: qty 2

## 2012-02-05 NOTE — ED Notes (Addendum)
Pt reports pain to R side under arm; pt reports it woke her up- in severe pain; pain with breathing and laying on side, pt reports pain is not in back nor abd--just on R side, denies any other symptoms; pt crying at triage; denies n/v/SOB/diarrhea

## 2012-02-05 NOTE — ED Notes (Signed)
ZPt denies injury or event that lead to chest pain medicated per order dilaudid 1mg  IVP

## 2012-02-05 NOTE — H&P (Signed)
Hospital Admission Note Date: 02/05/2012  Patient name: [name] Medical record number: [id number] Date of birth: 1981/05/23 Age: 31 y.o. Gender: female PCP: [name], MD  Medical Service: Internal Medicine Teaching Service  Attending physician:  Dr. Aundria Rud    1st Contact:  Dr. Garald Braver  Pager: [phone] 2nd Contact:  Dr. Tonny Branch  Pager:[phone] After 5 pm or weekends: 1st Contact:      Pager: [phone] 2nd Contact:      Pager: [phone]  Chief Complaint: right lower quadrant abdominal pain  History of Present Illness: [name] is a 31 yo woman with PMH of erosive esophagitis 2/2 NSAIDs, migraine headache, GERD, IBS, asthma presents today for right lower quadrant abdominal pain that started at 1:45AM on day of admission.  She describes pain as intermittent sharp and throbbing.  She cannot lay on her left side due to pain because her stomach hurts.  Pain is 9/10 in severity without any radiation. Pain is worse with movement and Morphine did help.  She denies any dysuria, burning sensation, increased in urinary frequency, vaginal discharge, fever, chills, or gross hematuria or recent pharyngitis/infection.  She did have 1 episode of vomiting while in the ED with clear vomitus with no bilious or blood.  Of note, on 01/25/12, she was seen in [facility] for gross blood in stool but negative guaiac and urinalysis on that day showed hematuria and calcium oxalate crystal.  Patient denied having had any past person or family history of nephrolithiasis.  Meds: Current Outpatient Rx  Name Route Sig Dispense Refill  . ALBUTEROL SULFATE HFA 108 (90 BASE) MCG/ACT IN AERS Inhalation Inhale 2 puffs into the lungs every 6 (six) hours as needed. For shortness of breath 3 Inhaler 1  . ACETAMINOPHEN-CODEINE #3 300-30 MG PO TABS Oral Take 1 tablet by mouth 2 (two) times daily as needed for pain. 60 tablet 1    Allergies: Allergies as of 02/05/2012 - Review Complete 02/05/2012  Allergen Reaction Noted    . Ibuprofen Other (See Comments) 01/30/2011   Past Medical History  Diagnosis Date  . GERD (gastroesophageal reflux disease)   . Erosive esophagitis     distal esophagus, with low grade bleed 03/2009  . IBS (irritable bowel syndrome)   . Anemia   . History of migraine headaches   . Asthma    History reviewed. No pertinent past surgical history. Family History  Problem Relation Age of Onset  . Heart disease Mother   . Migraines Mother   . Migraines Father   . Asthma Daughter    History   Social History  . Marital Status: Legally Separated    Spouse Name: N/A    Number of Children: N/A  . Years of Education: N/A   Occupational History  . Not on file.   Social History Main Topics  . Smoking status: Never Smoker   . Smokeless tobacco: Never Used  . Alcohol Use: No     occasional on holidays  . Drug Use: No  . Sexually Active: Yes -- Female partner(s)    Birth Control/ Protection: Injection   Other Topics Concern  . Not on file   Social History Narrative   Lives in [place], with 2 childrenWorks [facility] [employer]   She finished highschool, able to read and write. Has orange card.   Review of Systems:  Constitutional: Denies fever, chills, diaphoresis, appetite change and fatigue.  HEENT: Denies photophobia, eye pain, redness, hearing loss, ear pain,  congestion, sore throat, rhinorrhea, sneezing, mouth sores, trouble swallowing, neck pain, neck stiffness and tinnitus.  Respiratory: Denies SOB, DOE, cough, chest tightness, and wheezing.  Cardiovascular: Denies chest pain, palpitations and leg swelling.  Gastrointestinal: +nausea, +vomiting, +abdominal pain, no diarrhea, +constipation, blood in stool and abdominal distention.  Genitourinary: Denies dysuria, urgency, frequency, hematuria, +flank pain and difficulty urinating.  Musculoskeletal: Denies myalgias, back pain, joint swelling, arthralgias and gait problem.  Skin: Denies  pallor, rash and wound.  Neurological: Denies dizziness, seizures, syncope, weakness, light-headedness, numbness and headaches.  Hematological: Denies adenopathy. Easy bruising, personal or family bleeding history  Psychiatric/Behavioral: Denies suicidal ideation, mood changes, confusion, nervousness, sleep disturbance and agitation    Physical Exam: Blood pressure 128/82, pulse 76, temperature 97.7 F (36.5 C), temperature source Oral, resp. rate 18, height 5\' 6"  (1.676 m), weight 195 lb (88.451 kg), SpO2 98.00%. General: alert, well-developed, and cooperative to examination.  Head: normocephalic and atraumatic.  Eyes: vision grossly intact, pupils equal, pupils round, pupils reactive to light, no injection and anicteric.  Mouth: pharynx pink and moist, no erythema, and no exudates.  Neck: supple, full ROM, no thyromegaly, no JVD, and no carotid bruits.  Lungs: normal respiratory effort, no accessory muscle use, normal breath sounds, no crackles, and no wheezes. Heart: normal rate, regular rhythm, no murmur, no gallop, and no rub.  Abdomen: soft, +tender to palpation of the right upper and lower quadrant, +Murphy's sign, +mild rebound tenderness , hypoactive bowel sounds, no distention, no guarding. Right CVA tenderness Msk: no joint swelling, no joint warmth, and no redness over joints.  Pulses: 2+ DP/PT pulses bilaterally Extremities: No cyanosis, clubbing, edema Neurologic: alert & oriented X3, cranial nerves II-XII intact, strength normal in all extremities, sensation intact to light touch  Skin: turgor normal and no rashes.  Psych: Oriented X3, memory intact for recent and remote, normally interactive, good eye contact, not anxious appearing, and not depressed appearing.  Lab results: Basic Metabolic Panel:  [facility] 02/05/12 0347  NA 140  K 3.7  CL 107  CO2 23  GLUCOSE 105*  BUN 9  CREATININE 0.78  CALCIUM 9.0  MG --  PHOS --   Liver Function Tests:  Basename 02/05/12  0347  AST 12  ALT 10  ALKPHOS 53  BILITOT 0.2*  PROT 7.0  ALBUMIN 3.8    Basename 02/05/12 0347  LIPASE 16  AMYLASE --   CBC:  Basename 02/05/12 0347  WBC 9.1  NEUTROABS 5.5  HGB 12.1  HCT 35.1*  MCV 92.6  PLT 281   Urinalysis:  Basename 02/05/12 0527  COLORURINE YELLOW  LABSPEC 1.015  PHURINE 6.0  GLUCOSEU NEGATIVE  HGBUR LARGE*  BILIRUBINUR NEGATIVE  KETONESUR NEGATIVE  PROTEINUR 100*  UROBILINOGEN 0.2  NITRITE POSITIVE*  LEUKOCYTESUR MODERATE*   Imaging results:  Ct Abdomen Pelvis Wo Contrast  02/05/2012  *RADIOLOGY REPORT*  Clinical Data: Severe right-sided abdominal pain.  CT ABDOMEN AND PELVIS WITHOUT CONTRAST  Technique:  Multidetector CT imaging of the abdomen and pelvis was performed following the standard protocol without intravenous contrast.  Comparison: 03/09/2009  Findings: Mild dependent changes in the lung bases.  The kidneys appear symmetrical in size and shape.  No renal, ureteral, or bladder stones.  No pyelocaliectasis or ureterectasis.  The unenhanced appearance of the liver, spleen, gallbladder, pancreas, adrenal glands, abdominal aorta, and retroperitoneal lymph nodes is unremarkable.  Small accessory spleen.  The stomach, small bowel, and colon are not abnormally distended.  There is a small umbilical hernia  containing fat and a small supraumbilical midline hernia containing fat.  No bowel content.  Stable appearance since previous study.  No free fluid or free air in the abdomen.  Pelvis:  The appendix is normal.  There are moderately prominent lymph nodes in the right lower quadrant mesentery.  Changes may represent reactive lymph nodes or mesenteric adenitis. The uterus and adnexal structures are not enlarged.  The bladder wall is not thickened.  No free or loculated pelvic fluid collections.  No evidence of diverticulitis.  Normal alignment of the lumbar vertebrae.  IMPRESSION: No renal or ureteral stone or obstruction.  Normal appendix. Prominent  right lower quadrant lymph nodes without pathologic enlargement, suggesting reactive nodes or mesenteric adenitis.  Original Report Authenticated By: Marlon Pel, M.D.    Other results: BJY:NWGN.  Assessment & Plan by Problem:  1. Right quadrant abdominal pain: likely 2/2 UTI and mesenteric adenitis vs. Pelvic Inflammatory Disease vs. nephrolithiasis.   Urinalysis shows +nitrites and leukocytes, WBC 21-50.  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  CT scan of abd/pel shows right lower quadrant lymph nodes suggesting reactive nodes or mesenteric adenitis. Mesenteric adenitis is usually caused by Yersinia enterocolitica which is a gram-negative coccobacilli.  Patient was given 1 dose of IV cipro in ED. -Will admit to med surg floor -pain control with Dilaudid 1mg  IV q4hrs PRN -Continue Cipro 400mg  IV BID for UTI as well as mesenteric adenitis, if patient does not improve, consider Ceftazidine or Piperacillin.   -Get urine culture and stool culture, blood culture x2 -Check UDS. Lactic acid level -IVF with NS 75cc/hr  2. Hx of Erosive esophagitis: last endoscopy on 03/2009 by Dr. Arlyce Dice which showed esophagitis with active bleeding in the distal esophagus - s/p successful cauterization, hiatal hernia.  She reports taking Prilosec in the past and took her self off once her sx improved.  She has been feeling reflux symptoms in the past several days. -will start Protonix 40mg  IV qd (no po in setting of nausea&vomiting)  3. Hematuria: She has large Hb on urinalysis with 7-10 RBC on microscopy.  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 -Will need to repeat urinalysis in a few weeks, may need further work-up such as cystoscopy  4. Irritable bowel syndrome: has  constipation, 1 bowel movement per week.   5. Hx of asthma:stable. no wheezing on exam. Continue albuterol inhaler   DVT ppx: Lovenox SQ  Signed: Leul Narramore 02/05/2012, 7:33 AM

## 2012-02-05 NOTE — ED Notes (Signed)
Pt upset at triage and cussing about not having a room; tried to comfort pt and explain that we are waiting for a room to open up; pt still upset and stated her husband was out in waiting room; went and brought husband to bedside and update him as well

## 2012-02-05 NOTE — ED Provider Notes (Signed)
History     CSN: [id number]  Arrival date & time 02/05/12  0152   First MD Initiated Contact with Patient 02/05/12 [phone]      Chief Complaint  Patient presents with  . Pain    (Consider location/radiation/quality/duration/timing/severity/associated sxs/prior treatment) HPI Comments: Started yesterday with pain in the right flank.  Denies any urinary complaints.  No injury or trauma.  No fevers or chills.  Patient is a 31 y.o. female presenting with flank pain. The history is provided by the patient.  Flank Pain This is a new problem. The current episode started yesterday. The problem occurs constantly. The problem has been gradually worsening. Pertinent negatives include no abdominal pain. Nothing aggravates the symptoms. Nothing relieves the symptoms. She has tried nothing for the symptoms.    Past Medical History  Diagnosis Date  . GERD (gastroesophageal reflux disease)   . Erosive esophagitis     distal esophagus, with low grade bleed 03/2009  . IBS (irritable bowel syndrome)   . Anemia   . History of migraine headaches   . Asthma     History reviewed. No pertinent past surgical history.  Family History  Problem Relation Age of Onset  . Heart disease Mother   . Migraines Mother   . Migraines Father   . Asthma Daughter     History  Substance Use Topics  . Smoking status: Never Smoker   . Smokeless tobacco: Never Used  . Alcohol Use: No     occasional on holidays    OB History    Grav Para Term Preterm Abortions TAB SAB Ect Mult Living                  Review of Systems  Gastrointestinal: Positive for nausea. Negative for abdominal pain, diarrhea, constipation, blood in stool and abdominal distention.  Genitourinary: Positive for flank pain. Negative for dysuria, urgency, frequency and difficulty urinating.  All other systems reviewed and are negative.    Allergies  Ibuprofen  Home Medications   Current Outpatient Rx  Name Route Sig Dispense Refill   . ALBUTEROL SULFATE HFA 108 (90 BASE) MCG/ACT IN AERS Inhalation Inhale 2 puffs into the lungs every 6 (six) hours as needed. For shortness of breath 3 Inhaler 1  . ACETAMINOPHEN-CODEINE #3 300-30 MG PO TABS Oral Take 1 tablet by mouth 2 (two) times daily as needed for pain. 60 tablet 1    BP 137/85  Pulse 85  Temp 98.3 F (36.8 C) (Oral)  Resp 20  Ht 5\' 6"  (1.676 m)  Wt 195 lb (88.451 kg)  BMI 31.47 kg/m2  SpO2 97%  Physical Exam  Nursing note and vitals reviewed. Constitutional: She is oriented to person, place, and time. She appears well-developed and well-nourished. No distress.       She appears uncomfortable and is tearful.  HENT:  Head: Normocephalic and atraumatic.  Neck: Normal range of motion. Neck supple.  Cardiovascular: Normal rate and regular rhythm.  Exam reveals no gallop and no friction rub.   No murmur heard. Pulmonary/Chest: Effort normal and breath sounds normal. No respiratory distress. She has no wheezes.  Abdominal: Soft. Bowel sounds are normal. She exhibits no distension. There is no tenderness.  Genitourinary:       There is pain/ttp in the right flank.    Musculoskeletal: Normal range of motion.  Neurological: She is alert and oriented to person, place, and time.  Skin: Skin is warm and dry. She is not diaphoretic.  ED Course  Procedures (including critical care time)   Labs Reviewed  CBC WITH DIFFERENTIAL  COMPREHENSIVE METABOLIC PANEL  LIPASE, BLOOD   No results found.   No diagnosis found.    MDM  The patient presents with pain in the right flank and a workup showing a urinary tract infection on the urinalysis and ct that shows mesenteric adenitis.  She is not febrile and there is no wbc, however she is having significant pain.  I suspect the major cause of her discomfort is pyelonephritis.  She will be given iv cipro and I will consult triad for admission.          Geoffery Lyons, MD 02/05/12 [phone]

## 2012-02-06 DIAGNOSIS — I880 Nonspecific mesenteric lymphadenitis: Secondary | ICD-10-CM

## 2012-02-06 DIAGNOSIS — K2100 Gastro-esophageal reflux disease with esophagitis, without bleeding: Secondary | ICD-10-CM

## 2012-02-06 LAB — CBC
HCT: 36.1 % (ref 36.0–46.0)
Hemoglobin: 12.4 g/dL (ref 12.0–15.0)
MCH: 31.8 pg (ref 26.0–34.0)
MCHC: 34.3 g/dL (ref 30.0–36.0)
MCV: 92.6 fL (ref 78.0–100.0)
Platelets: 285 10*3/uL (ref 150–400)
RBC: 3.9 MIL/uL (ref 3.87–5.11)
RDW: 13.7 % (ref 11.5–15.5)
WBC: 7.1 10*3/uL (ref 4.0–10.5)

## 2012-02-06 LAB — BASIC METABOLIC PANEL
Calcium: 9.1 mg/dL (ref 8.4–10.5)
GFR calc Af Amer: >90 mL/min (ref >90)
GFR calc non Af Amer: >90 mL/min (ref >90)
Potassium: 3.7 mEq/L (ref 3.5–5.1)
Sodium: 140 mEq/L (ref 135–145)

## 2012-02-06 MED ORDER — HYDROMORPHONE HCL PF 1 MG/ML IJ SOLN
0.5000 mg | INTRAMUSCULAR | Status: DC | PRN
  Administered 2012-02-06 (×4): 0.5 mg via INTRAVENOUS
  Filled 2012-02-06 (×3): qty 1

## 2012-02-06 MED ORDER — HYDROMORPHONE HCL PF 1 MG/ML IJ SOLN
INTRAMUSCULAR | Status: AC
  Filled 2012-02-06: qty 1

## 2012-02-06 MED ORDER — CYCLOBENZAPRINE HCL 5 MG PO TABS
5.0000 mg | ORAL_TABLET | Freq: Three times a day (TID) | ORAL | Status: DC
  Administered 2012-02-06 – 2012-02-07 (×2): 5 mg via ORAL
  Filled 2012-02-06 (×4): qty 1

## 2012-02-06 MED ORDER — CIPROFLOXACIN HCL 500 MG PO TABS
500.0000 mg | ORAL_TABLET | Freq: Two times a day (BID) | ORAL | Status: DC
  Administered 2012-02-06 – 2012-02-07 (×2): 500 mg via ORAL
  Filled 2012-02-06 (×4): qty 1

## 2012-02-06 MED ORDER — PANTOPRAZOLE SODIUM 40 MG PO TBEC
40.0000 mg | DELAYED_RELEASE_TABLET | Freq: Every day | ORAL | Status: DC
  Administered 2012-02-07: 40 mg via ORAL
  Filled 2012-02-06: qty 1

## 2012-02-06 MED ORDER — OXYCODONE-ACETAMINOPHEN 5-325 MG PO TABS
1.0000 | ORAL_TABLET | ORAL | Status: DC | PRN
  Administered 2012-02-06 – 2012-02-07 (×6): 1 via ORAL
  Filled 2012-02-06 (×6): qty 1

## 2012-02-06 NOTE — Discharge Summary (Signed)
Patient Name:  [name] MRN: [id number]  PCP: [name], MD DOB:  07-13-1980       Date of Admission:  02/05/2012  Date of Discharge:  02/07/2012      Attending Physician: No att. providers found    DISCHARGE DIAGNOSES:  1. UTI 2. Mesenteric Adenitis 3. Reflux Esophagitis 4. History of Migraines:  DISPOSITION AND FOLLOW-UP: [name] is to follow-up with the listed providers as detailed below, at which time, the following should be addressed:   1. Follow-up visits: 1. Flank/abdominal pain: has there been any resolution? 2. Noticeable hematuria or hematochezia. Are there any urinary symptoms? U/A should be performed at the follow-up visit. 3. Migraines: consider counseling the patient on the characteristic signs and symptoms of migraine headaches along with the proper treatment for this disease. There has been concern that the patient has had drug-seeking behavior in the past, so there should be exploration for non-opioid options for pain management. She is currently taking a low-dose opioid for her migraines - Tylenol 3.  2. Labs / imaging needed: 1. F/u U/A Follow-up Information    Follow up with [name], MD on 03/17/2012. (at 3:45)    Contact information:   [address] [place] [address] [phone]       Follow up with [name], MD on 02/11/2012. (at 1:15 pm)    Contact information:   [address] [place] [address] [phone]         Discharge Orders    Future Appointments: Provider: Department: Dept Phone: Center:   02/11/2012 1:15 PM [name], MD [facility] [phone] [facility]   03/17/2012 3:45 PM [name], MD [facility] [phone] [facility]     Future Orders Please Complete By Expires   Diet - low sodium heart healthy      Increase activity slowly      Discharge instructions      Comments:   1. Start Ciprofloxacin 500 mg tablets.  Take 1 tablet  twice daily until gone  2.  Start Flexeril 5 mg tablets take 1 tablet every 8 hours as needed for the pain.  Caution that it will can make you sleepy.  3.  Continue all of your other medications as prescribed.  4.  You have a Follow up appointment on Monday 02/11/12 in the Clinic with Dr. [name] at 1:15 pm   Call MD for:  temperature >100.4        DISCHARGE MEDICATIONS: Medication List  As of 02/07/2012  2:18 PM   TAKE these medications         acetaminophen-codeine 300-30 MG per tablet   Commonly known as: TYLENOL #3   Take 1 tablet by mouth 2 (two) times daily as needed for pain.      albuterol 108 (90 BASE) MCG/ACT inhaler   Commonly known as: PROVENTIL HFA;VENTOLIN HFA   Inhale 2 puffs into the lungs every 6 (six) hours as needed. For shortness of breath      ciprofloxacin 500 MG tablet   Commonly known as: CIPRO   Take 1 tablet (500 mg total) by mouth 2 (two) times daily.      cyclobenzaprine 5 MG tablet   Commonly known as: FLEXERIL   Take 1 tablet (5 mg total) by mouth 3 (three) times daily.      pantoprazole 40 MG tablet   Commonly known as:  PROTONIX   Take 1 tablet (40 mg total) by mouth daily at 12 noon.           PROCEDURES PERFORMED:  Ct Abdomen Pelvis Wo Contrast  02/05/2012  *RADIOLOGY REPORT*  Clinical Data: Severe right-sided abdominal pain.  CT ABDOMEN AND PELVIS WITHOUT CONTRAST  Technique:  Multidetector CT imaging of the abdomen and pelvis was performed following the standard protocol without intravenous contrast.  Comparison: 03/09/2009  Findings: Mild dependent changes in the lung bases.  The kidneys appear symmetrical in size and shape.  No renal, ureteral, or bladder stones.  No pyelocaliectasis or ureterectasis.  The unenhanced appearance of the liver, spleen, gallbladder, pancreas, adrenal glands, abdominal aorta, and retroperitoneal lymph nodes is unremarkable.  Small accessory spleen.  The stomach, small bowel, and colon are not abnormally distended.  There  is a small umbilical hernia containing fat and a small supraumbilical midline hernia containing fat.  No bowel content.  Stable appearance since previous study.  No free fluid or free air in the abdomen.  Pelvis:  The appendix is normal.  There are moderately prominent lymph nodes in the right lower quadrant mesentery.  Changes may represent reactive lymph nodes or mesenteric adenitis. The uterus and adnexal structures are not enlarged.  The bladder wall is not thickened.  No free or loculated pelvic fluid collections.  No evidence of diverticulitis.  Normal alignment of the lumbar vertebrae.  IMPRESSION: No renal or ureteral stone or obstruction.  Normal appendix. Prominent right lower quadrant lymph nodes without pathologic enlargement, suggesting reactive nodes or mesenteric adenitis.  Original Report Authenticated By: Marlon Pel, M.D.    ADMISSION DATA: H&P:  [name] is a 31 yo woman with PMH of erosive esophagitis 2/2 NSAIDs, migraine headache, GERD, IBS, asthma presents today for right lower quadrant abdominal pain that started at 1:45AM on day of admission. She describes pain as intermittent sharp and throbbing. She cannot lay on her left side due to pain because her stomach hurts. Pain is 9/10 in severity without any radiation. Pain is worse with movement and Morphine did help. She denies any dysuria, burning sensation, increased in urinary frequency, vaginal discharge, fever, chills, or gross hematuria or recent pharyngitis/infection. She did have 1 episode of vomiting while in the ED with clear vomitus with no bilious or blood. Of note, on 01/25/12, she was seen in [facility] for gross blood in stool but negative guaiac and urinalysis on that day showed hematuria and calcium oxalate crystal. Patient denied having had any past person or family history of nephrolithiasis.  Physical Exam: Blood pressure 128/82, pulse 76, temperature 97.7 F (36.5 C), temperature source Oral, resp. rate 18, height 5'  6" (1.676 m), weight 195 lb (88.451 kg), SpO2 98.00%.  General: alert, well-developed, and cooperative to examination.  Head: normocephalic and atraumatic.  Eyes: vision grossly intact, pupils equal, pupils round, pupils reactive to light, no injection and anicteric.  Mouth: pharynx pink and moist, no erythema, and no exudates.  Neck: supple, full ROM, no thyromegaly, no JVD, and no carotid bruits.  Lungs: normal respiratory effort, no accessory muscle use, normal breath sounds, no crackles, and no wheezes. Heart: normal rate, regular rhythm, no murmur, no gallop, and no rub.  Abdomen: soft, +tender to palpation of the right upper and lower quadrant, +Murphy's sign, +mild rebound tenderness , hypoactive bowel sounds, no distention, no guarding. Right CVA tenderness  Msk: no joint swelling, no joint warmth, and no redness over joints.  Pulses: 2+ DP/PT  pulses bilaterally Extremities: No cyanosis, clubbing, edema Neurologic: alert & oriented X3, cranial nerves II-XII intact, strength normal in all extremities, sensation intact to light touch  Skin: turgor normal and no rashes.  Psych: Oriented X3, memory intact for recent and remote, normally interactive, good eye contact, not anxious appearing, and not depressed appearing.  Labs: Basic Metabolic Panel:   [facility]  02/05/12 0347   NA  140   K  3.7   CL  107   CO2  23   GLUCOSE  105*   BUN  9   CREATININE  0.78   CALCIUM  9.0   MG  --   PHOS  --    Liver Function Tests:   Basename  02/05/12 0347   AST  12   ALT  10   ALKPHOS  53   BILITOT  0.2*   PROT  7.0   ALBUMIN  3.8     Basename  02/05/12 0347   LIPASE  16   AMYLASE  --    CBC:   Basename  02/05/12 0347   WBC  9.1   NEUTROABS  5.5   HGB  12.1   HCT  35.1*   MCV  92.6   PLT  281    Urinalysis:   Basename  02/05/12 0527   COLORURINE  YELLOW   LABSPEC  1.015   PHURINE  6.0   GLUCOSEU  NEGATIVE   HGBUR  LARGE*   BILIRUBINUR  NEGATIVE   KETONESUR   NEGATIVE   PROTEINUR  100*   UROBILINOGEN  0.2   NITRITE  POSITIVE*   LEUKOCYTESUR     HOSPITAL COURSE:  1. Mesenteric Adenitis:  CT scan of abdomen/pelvis showed right lower quadrant lymph nodes suggesting reactive nodes or mesenteric adenitis. Mesenteric adenitis is usually caused by Yersinia enterocolitica which is a gram-negative coccobacilli. The process may be 2/2 her UTI. Her current antibiotic regimen covered Yersinia Enterocolitica if this was a primary disease. She was started on fluids, Cipro, and clear liquid diet with pain control and has done well since admission. Her diet was advanced yesterday and today she is tolerating a full diet without much discomfort. Urine culture grew E. Coli with pending sensitivities. Blood cultures are negative to date and she has remained afebrile. We will plan to d/c today with a total of 10 days of cipro.   2. UTI: Urinalysis on admission showed +nitrites and leukocytes, WBC 21-50. Of note, she might have passed a renal stone since she did have calcium oxalate crystals on urinalysis on 01/25/12 and she had large hemoglobin on her urinalysis with 7-10 RBC on microscopy. She was given 1 dose of IV cipro in the ED and started on IV cipro on admission. On 02/06/12 she was transitioned to oral ciprofloxacin, which she tolerated well. Her flank discomfort was believed to be 2/2 her UTI along with MSK lower back strain/spasm. She was placed on IV Dilautid PRN breakthrough pain and was weaned down to Percocet PO for pain. She was also started on cyclobenzaprine during her hospital stay for any potential MSK process. Her WBC is trending down and she has no urinary symptoms. Her appetite improved to her baseline on discharge. She was discharged with a 7-day course of Ciprofloxacin along with Tylenol 3 and cyclobenzaprine for pain.  3. Reflux Esophagitis: Mrs. Hoot had been feeling reflux symptoms in the past several days before her admission. She was started on Protonix  40mg  IV qd and transitioned to  PO on 02/06/12. She has taken prilosec in the past but has not been taking it recently due to improved symptoms. Her last endoscopy was on 03/2009 with Dr. Arlyce Dice, which showed esophagitis with active bleeding distally. She was not having reflux symptoms on the day of discharge, and was discharged on oral protonix.  4. History of Migraines: The patient complains of migraine headaches that occur frequently, even daily at times. She takes Tylenol 3 along with Nifedipine for her symptoms. While discontinuing her analgesic medications on discharge would not be appropriate due to her persistent pain 2/2 UTI, she should be counseled in the outpatient setting on the characteristic signs and symptoms of migraine headaches along with the proper treatment for this disease. There has been concern that the patient has been drug-seeking in the past, so non-opioid options for her headaches/pain management should be explored.  DISCHARGE DATA: Vital Signs: BP 127/78  Pulse 79  Temp 98.2 F (36.8 C) (Oral)  Resp 18  Ht 5\' 6"  (1.676 m)  Wt 195 lb (88.451 kg)  BMI 31.47 kg/m2  SpO2 100%  Time spent on discharge: 40 minutes  Signed byLeodis Sias Internal Medicine Resident Pager: [phone] 02/07/2012 2:18 PM

## 2012-02-06 NOTE — Progress Notes (Signed)
Medical Student Daily Progress Note  Subjective: [name] is still feeling significant flank pain that radiates to her right abdomen. Her appetite is still decreased but has improved since yesterday. She is unsure if she can tolerate PO medications, but is willing to try. She has no urinary symptoms, has been afebrile, and had NAE overnight. There was a time for about 5 hours last night where she could not get medications for pain, the team apologized for this inconvenience and assured her this would never happen again. She has no other complaints and was resting in bed during the interview Objective: Vital signs in last 24 hours: Filed Vitals:   02/05/12 1030 02/05/12 1244 02/05/12 2207 02/06/12 0547  BP: 136/62 127/64 128/80 120/68  Pulse: 74 68 76 63  Temp: 98.2 F (36.8 C) 98 F (36.7 C) 98.8 F (37.1 C) 98.4 F (36.9 C)  TempSrc:      Resp: 18 18 19 19   Height:      Weight:      SpO2: 100% 98% 100% 100%   Weight change:  No intake or output data in the 24 hours ending 02/06/12 1423 Physical Exam: Vitals reviewed. General: resting in bed, mildly uncomfortable HEENT: PERRL, EOMI, no scleral icterus Cardiac: RRR, no rubs, murmurs or gallops Pulm: clear to auscultation bilaterally, no wheezes, rales, or rhonchi Abd: Right CVA tenderness, moderate abdominal tenderness on palpation, particularly in the RUQ. Otherwise soft, nondistended, BS present Ext: warm and well perfused, no pedal edema Neuro: alert and oriented X3, cranial nerves II-XII grossly intact, strength and sensation to light touch equal in bilateral upper and lower extremities  Lab Results: Basic Metabolic Panel:  Lab 02/06/12 2952 02/05/12 1135 02/05/12 0347  NA 140 -- 140  K 3.7 -- 3.7  CL 107 -- 107  CO2 21 -- 23  GLUCOSE 89 -- 105*  BUN 5* -- 9  CREATININE 0.72 0.69 --  CALCIUM 9.1 -- 9.0  MG -- -- --  PHOS -- -- --   CBC:  Lab 02/06/12 0610 02/05/12 1135 02/05/12 0347  WBC 7.1 11.7* --    NEUTROABS -- -- 5.5  HGB 12.4 12.0 --  HCT 36.1 34.7* --  MCV 92.6 91.8 --  PLT 285 280 --   Urine Drug Screen: Drugs of Abuse     Component Value Date/Time   LABOPIA POSITIVE* 02/05/2012 2047   LABOPIA NEG 03/13/2011 1600   COCAINSCRNUR NONE DETECTED 02/05/2012 2047   COCAINSCRNUR NEG 03/13/2011 1600   LABBENZ NONE DETECTED 02/05/2012 2047   LABBENZ NEG 03/13/2011 1600   AMPHETMU NONE DETECTED 02/05/2012 2047   AMPHETMU NEG 03/13/2011 1600   THCU POSITIVE* 02/05/2012 2047   LABBARB NONE DETECTED 02/05/2012 2047    Urinalysis:  Lab 02/05/12 0527  COLORURINE YELLOW  LABSPEC 1.015  PHURINE 6.0  GLUCOSEU NEGATIVE  HGBUR LARGE*  BILIRUBINUR NEGATIVE  KETONESUR NEGATIVE  PROTEINUR 100*  UROBILINOGEN 0.2  NITRITE POSITIVE*  LEUKOCYTESUR MODERATE*    Micro Results: Recent Results (from the past 240 hour(s))  CULTURE, BLOOD (ROUTINE X 2)     Status: Normal (Preliminary result)   Collection Time   02/05/12  9:28 AM      Component Value Range Status Comment   Specimen Description BLOOD ARM LEFT   Final    Special Requests BOTTLES DRAWN AEROBIC AND ANAEROBIC 10CC   Final    Culture  Setup Time 02/05/2012 13:31   Final    Culture     Final  Value:        BLOOD CULTURE RECEIVED NO GROWTH TO DATE CULTURE WILL BE HELD FOR 5 DAYS BEFORE ISSUING A FINAL NEGATIVE REPORT   Report Status PENDING   Incomplete   CULTURE, BLOOD (ROUTINE X 2)     Status: Normal (Preliminary result)   Collection Time   02/05/12  9:32 AM      Component Value Range Status Comment   Specimen Description BLOOD HAND LEFT   Final    Special Requests BOTTLES DRAWN AEROBIC AND ANAEROBIC 10CC   Final    Culture  Setup Time 02/05/2012 13:32   Final    Culture     Final    Value:        BLOOD CULTURE RECEIVED NO GROWTH TO DATE CULTURE WILL BE HELD FOR 5 DAYS BEFORE ISSUING A FINAL NEGATIVE REPORT   Report Status PENDING   Incomplete    Studies/Results: Ct Abdomen Pelvis Wo Contrast  02/05/2012  *RADIOLOGY REPORT*  Clinical  Data: Severe right-sided abdominal pain.  CT ABDOMEN AND PELVIS WITHOUT CONTRAST  Technique:  Multidetector CT imaging of the abdomen and pelvis was performed following the standard protocol without intravenous contrast.  Comparison: 03/09/2009  Findings: Mild dependent changes in the lung bases.  The kidneys appear symmetrical in size and shape.  No renal, ureteral, or bladder stones.  No pyelocaliectasis or ureterectasis.  The unenhanced appearance of the liver, spleen, gallbladder, pancreas, adrenal glands, abdominal aorta, and retroperitoneal lymph nodes is unremarkable.  Small accessory spleen.  The stomach, small bowel, and colon are not abnormally distended.  There is a small umbilical hernia containing fat and a small supraumbilical midline hernia containing fat.  No bowel content.  Stable appearance since previous study.  No free fluid or free air in the abdomen.  Pelvis:  The appendix is normal.  There are moderately prominent lymph nodes in the right lower quadrant mesentery.  Changes may represent reactive lymph nodes or mesenteric adenitis. The uterus and adnexal structures are not enlarged.  The bladder wall is not thickened.  No free or loculated pelvic fluid collections.  No evidence of diverticulitis.  Normal alignment of the lumbar vertebrae.  IMPRESSION: No renal or ureteral stone or obstruction.  Normal appendix. Prominent right lower quadrant lymph nodes without pathologic enlargement, suggesting reactive nodes or mesenteric adenitis.  Original Report Authenticated By: Marlon Pel, M.D.   Medications: I have reviewed the patient's current medications. Scheduled Meds:    . sodium chloride   Intravenous STAT  . ciprofloxacin  500 mg Oral BID  . cyclobenzaprine  5 mg Oral TID  . enoxaparin (LOVENOX) injection  40 mg Subcutaneous Q24H  . HYDROmorphone      . pantoprazole  40 mg Oral Q1200  . DISCONTD: ciprofloxacin  400 mg Intravenous Q12H  . DISCONTD: pantoprazole (PROTONIX) IV   40 mg Intravenous Daily   Continuous Infusions:   . sodium chloride 150 mL/hr at 02/06/12 0700   PRN Meds:.albuterol, HYDROmorphone (DILAUDID) injection, ondansetron (ZOFRAN) IV, ondansetron, oxyCODONE-acetaminophen, DISCONTD: cyclobenzaprine, DISCONTD:  HYDROmorphone (DILAUDID) injection Assessment/Plan: Principal Problem:  UTI: [name] is still experiencing flank discomfort today. Her WBC is trending down and she still has no urinary symptoms. Her appetite is improving, but she is unsure if she can tolerate oral medications. - Change from IV to PO ciprofloxacin 500 mg BID - Percocet 5-325 mg PO Q4H PRN moderate/severe pain - Continue cyclobenzaprine 5 mg PO TID PRN pain. We have a high suspicion for  an MSK process with her UTI given her decreased truncal ROM on physical exam. - Continue IVF, plan to d/c in the morning - Regular diet as tolerated  Active Problems:  Mesenteric Adenitis: may be 2/2 UTI. Her current antibiotic regimen covers Yersinia Enterocolitica if this is a primary process.   Reflux esophagitis: Patient denies any symptoms of dyspepsia or reflux. - Change patient from IV to PO protonix 40 mg QDay   ANEMIA: [name]'s Hemoglobin has been WNL during her stay.   DVT ppx: continue Lovenox 40 mg subQ QDay  Dispo: - patient will most likely be discharged tomorrow morning - arrange f/u with outpt clinic [facility] at [facility]    LOS: 1 day   This is a Psychologist, occupational Note.  The care of the patient was discussed with Dr. Anselm Jungling and the assessment and plan formulated with their assistance.  Please see their attached note for official documentation of the daily encounter.  Luretha Rued 02/06/2012, 2:23 PM

## 2012-02-06 NOTE — H&P (Signed)
IM Attending  42 woman adm for UTI.  Vague report of red blood in stool once (guiaic negative on admission.  UA with clear pyuria.  Right CVAT but no fever and minimal leucocytosis.  Suggest early discharge on oral antibiotic and taper incremental opiate rx over no more than 3 days.  If she has recurrent complaint of GI bleeding may need a colonoscopy.

## 2012-02-06 NOTE — Progress Notes (Signed)
Resident Co-sign Daily Note: I have seen the patient and reviewed the daily progress note by  MS-4 Luretha Rued and discussed the care of the patient with them.  See my note separately for documentation of my findings, assessment, and plans.

## 2012-02-06 NOTE — Progress Notes (Signed)
Resident Co-sign Daily Note: I have seen the patient and reviewed the daily progress note by  MS-4, Luretha Rued and discussed the care of the patient with them.  See below for documentation of my findings, assessment, and plans.  Subjective: Patient is upset about her care last night because nurse did not check on her and said that all of her pain med orders were taken off. She feels somewhat better today and is able to eat lunch without any nausea and vomiting.  She still has abdominal and right flank pain.  She reports having some gross hematuria last night.  She is amenable to switching to PO meds today. Objective: Vital signs in last 24 hours: Filed Vitals:   02/05/12 1030 02/05/12 1244 02/05/12 2207 02/06/12 0547  BP: 136/62 127/64 128/80 120/68  Pulse: 74 68 76 63  Temp: 98.2 F (36.8 C) 98 F (36.7 C) 98.8 F (37.1 C) 98.4 F (36.9 C)  TempSrc:      Resp: 18 18 19 19   Height:      Weight:      SpO2: 100% 98% 100% 100%   Physical Exam: General: alert, well-developed, and cooperative to examination.   Lungs: normal respiratory effort, no accessory muscle use, normal breath sounds, no crackles, and no wheezes. Heart: normal rate, regular rhythm, no murmur, no gallop, and no rub.  Abdomen: soft, +tender to palpation of lower quadrant, normal bowel sounds, no distention, no guarding, no rebound tenderness. Right CVA tenderness.  Pulses: 2+ DP/PT pulses bilaterally Extremities: No cyanosis, clubbing, edema Neurologic: alert & oriented X3, cranial nerves II-XII intact, strength normal in all extremities, sensation intact to light touch, and gait normal.  Skin: turgor normal and no rashes.  Psych:  + anxious appearing, and + depressed appearing.  Lab Results: Reviewed and documented in Electronic Record Micro Results: Reviewed and documented in Electronic Record Studies/Results: Reviewed and documented in Electronic Record Medications: reviewed Scheduled Meds:   . sodium  chloride   Intravenous STAT  . ciprofloxacin  500 mg Oral BID  . cyclobenzaprine  5 mg Oral TID  . enoxaparin (LOVENOX) injection  40 mg Subcutaneous Q24H  . HYDROmorphone      . pantoprazole  40 mg Oral Q1200  . DISCONTD: ciprofloxacin  400 mg Intravenous Q12H  . DISCONTD: pantoprazole (PROTONIX) IV  40 mg Intravenous Daily   Continuous Infusions:   . sodium chloride 150 mL/hr at 02/06/12 0700   PRN Meds:.albuterol, HYDROmorphone (DILAUDID) injection, ondansetron (ZOFRAN) IV, ondansetron, oxyCODONE-acetaminophen, DISCONTD: cyclobenzaprine, DISCONTD:  HYDROmorphone (DILAUDID) injection Assessment/Plan:  1. Right quadrant abdominal pain: likely 2/2 UTI and mesenteric adenitis vs. Nephrolithiasis. Pain is slightly improving and is able to tolerate PO today. Lactic acid is WNL -will d/c IV dilaudid -Start Percocet 5mg /325mg  q4hr prn pain -Change to PO cipro 500mg  po bid -urine culture and stool culture, blood culture x2:  results pending  -decrease IVF to 75cc today since she can tolerate PO -encourage PO fluid intake  2. Hx of Erosive esophagitis: last endoscopy on 03/2009 by Dr. Arlyce Dice which showed esophagitis with active bleeding in the distal esophagus - s/p successful cauterization, hiatal hernia. She reports taking Prilosec in the past and took her self off once her sx improved. She has been feeling reflux symptoms in the past several days.  -will change to PO Protonix 40mg  qd  3. Hematuria: She has large Hb on urinalysis with 7-10 RBC on microscopy. 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 I am not clear if she really had gross hematuria last night or not. Will continue to monitor -Will need to repeat urinalysis in a few weeks, may need further work-up such as cystoscopy   4. Irritable bowel syndrome: has constipation, 1 bowel movement per week.   5. Hx of asthma:stable. no wheezing on exam. Continue  albuterol inhaler   DVT ppx: Lovenox SQ  Dispo: probably tomorrow pending clinical improvement   LOS: 1 day   Waunita Sandstrom 02/06/2012, 1:01 PM

## 2012-02-06 NOTE — Progress Notes (Signed)
Utilization review completed. Sandar Krinke, RN, BSN. 

## 2012-02-07 ENCOUNTER — Telehealth: Payer: Self-pay | Admitting: *Deleted

## 2012-02-07 ENCOUNTER — Ambulatory Visit: Payer: Self-pay

## 2012-02-07 DIAGNOSIS — N12 Tubulo-interstitial nephritis, not specified as acute or chronic: Secondary | ICD-10-CM

## 2012-02-07 LAB — URINE CULTURE: Colony Count: >=100000

## 2012-02-07 MED ORDER — CIPROFLOXACIN HCL 500 MG PO TABS: 500.0000 mg | ORAL_TABLET | Freq: Two times a day (BID) | ORAL | Status: AC

## 2012-02-07 MED ORDER — MEDROXYPROGESTERONE ACETATE 150 MG/ML IM SUSP
150.0000 mg | Freq: Once | INTRAMUSCULAR | Status: AC
  Administered 2012-02-07: 150 mg via INTRAMUSCULAR
  Filled 2012-02-07 (×2): qty 1

## 2012-02-07 MED ORDER — PANTOPRAZOLE SODIUM 40 MG PO TBEC: 40.0000 mg | DELAYED_RELEASE_TABLET | Freq: Every day | ORAL | Status: DC

## 2012-02-07 MED ORDER — CYCLOBENZAPRINE HCL 5 MG PO TABS: 5.0000 mg | ORAL_TABLET | Freq: Three times a day (TID) | ORAL | Status: AC

## 2012-02-07 NOTE — Progress Notes (Signed)
Medical Student Daily Progress Note  Subjective: [name] is feeling much better today. Her pain has improved and she is tolerating meals closer to her baseline. No urinary symptoms or hematuria endorsed. She has not had a BM, but is not feeling bloated. NAE overnight. Objective: Vital signs in last 24 hours: Filed Vitals:   02/06/12 0547 02/06/12 1400 02/06/12 2152 02/07/12 0604  BP: 120/68 120/78 123/59 127/78  Pulse: 63 72 65 79  Temp: 98.4 F (36.9 C) 98.4 F (36.9 C) 98.6 F (37 C) 98.2 F (36.8 C)  TempSrc:      Resp: 19 17 18 18   Height:      Weight:      SpO2: 100% 100% 100% 100%   Weight change:   Intake/Output Summary (Last 24 hours) at 02/07/12 1217 Last data filed at 02/06/12 2201  Gross per 24 hour  Intake 1651.25 ml  Output      0 ml  Net 1651.25 ml   Physical Exam: Vitals reviewed. General: resting in bed, NAD HEENT: PERRL, EOMI, no scleral icterus Cardiac: RRR, no rubs, murmurs or gallops Pulm: clear to auscultation bilaterally, no wheezes, rales, or rhonchi Abd: Abdominal pain is nearly obsolete. Still CVA tenderness on the right side. Abdomen otherwise soft, nondistended, BS present Ext: warm and well perfused, no pedal edema Neuro: alert and oriented X3, cranial nerves II-XII grossly intact, strength and sensation to light touch equal in bilateral upper and lower extremities  Micro Results: Recent Results (from the past 240 hour(s))  URINE CULTURE     Status: Normal (Preliminary result)   Collection Time   02/05/12  5:27 AM      Component Value Range Status Comment   Specimen Description URINE, RANDOM   Final    Special Requests NONE   Final    Culture  Setup Time 02/06/2012 02:42   Final    Colony Count >=100,000 COLONIES/ML   Final    Culture ESCHERICHIA COLI   Final    Report Status PENDING   Incomplete   CULTURE, BLOOD (ROUTINE X 2)     Status: Normal (Preliminary result)   Collection Time   02/05/12  9:28 AM      Component Value Range  Status Comment   Specimen Description BLOOD ARM LEFT   Final    Special Requests BOTTLES DRAWN AEROBIC AND ANAEROBIC 10CC   Final    Culture  Setup Time 02/05/2012 13:31   Final    Culture     Final    Value:        BLOOD CULTURE RECEIVED NO GROWTH TO DATE CULTURE WILL BE HELD FOR 5 DAYS BEFORE ISSUING A FINAL NEGATIVE REPORT   Report Status PENDING   Incomplete   CULTURE, BLOOD (ROUTINE X 2)     Status: Normal (Preliminary result)   Collection Time   02/05/12  9:32 AM      Component Value Range Status Comment   Specimen Description BLOOD HAND LEFT   Final    Special Requests BOTTLES DRAWN AEROBIC AND ANAEROBIC 10CC   Final    Culture  Setup Time 02/05/2012 13:32   Final    Culture     Final    Value:        BLOOD CULTURE RECEIVED NO GROWTH TO DATE CULTURE WILL BE HELD FOR 5 DAYS BEFORE ISSUING A FINAL NEGATIVE REPORT   Report Status PENDING   Incomplete    Medications: I have reviewed the patient's current medications.  Scheduled Meds:   . ciprofloxacin  500 mg Oral BID  . cyclobenzaprine  5 mg Oral TID  . enoxaparin (LOVENOX) injection  40 mg Subcutaneous Q24H  . HYDROmorphone      . medroxyPROGESTERone  150 mg Intramuscular Once  . pantoprazole  40 mg Oral Q1200  . DISCONTD: ciprofloxacin  400 mg Intravenous Q12H  . DISCONTD: pantoprazole (PROTONIX) IV  40 mg Intravenous Daily   Continuous Infusions:   . sodium chloride 75 mL/hr at 02/06/12 1900   PRN Meds:.albuterol, ondansetron (ZOFRAN) IV, ondansetron, oxyCODONE-acetaminophen, DISCONTD: cyclobenzaprine, DISCONTD:  HYDROmorphone (DILAUDID) injection Assessment/Plan: Principal Problem:  *UTI: The patient's abdominal pain is nearly resolved, she is tolerating an oral diet. While there is still CVA tenderness, she has been afebrile and her symptoms otherwise have been improving. - 10 day Ciprofloxacin course: Patient is now on day 3/10. Send home on Cipro 500 mg PO BID x 7 days. Urine cultures are still pending, will adjust  medications by these results if neccessary. - Patient can use home Tylenol 3 for breakthrough pain. -dc IVF - f/u U/A scheduled for this Monday, 8/12  Active Problems:  Reflux esophagitis: Continue protonix 40 mg PO QDay  Birth Control: Pt is due for Depo Provera shot today, will receive in-house.  Migraines: The patient is currently taking Tylenol 3 for her migraines, which she dsescribes as happening every day. Recommend counseling in the outpatient seetting on the characteristic signs and symptoms of migraine headaches along with the proper treatment for this process. There has been concern that the patient has been drug-seeking in the past, so there should be exploration non-opioid options for her headaches/pain management.  DVT Ppx: dc Lovenox  Dispo:Patient can go home today. F/u appointment with Dr. Lucretia Field on Monday 8/12 @ 1:15 PM for f/u UA and discussion of her headaches.   LOS: 2 days   This is a Psychologist, occupational Note.  The care of the patient was discussed with Dr. Tonny Branch and the assessment and plan formulated with their assistance.  Please see their attached note for official documentation of the daily encounter.  [name] 02/07/2012, 12:16 PM

## 2012-02-07 NOTE — Telephone Encounter (Signed)
Tylenol #3 rx  written 8/5 per Dr Rogelia Boga called to [facility].

## 2012-02-07 NOTE — Progress Notes (Signed)
Resident Co-sign Daily Note: I have seen the patient and reviewed the daily progress note by Kathrin Ruddy MS IV and discussed the care of the patient with them.  See below for documentation of my findings, assessment, and plans.  Subjective: Feeling better today.  Pain is much reduced and she is eating well.  States that she has no urinary symptoms.   Objective: Vital signs in last 24 hours: Filed Vitals:   02/06/12 0547 02/06/12 1400 02/06/12 2152 02/07/12 0604  BP: 120/68 120/78 123/59 127/78  Pulse: 63 72 65 79  Temp: 98.4 F (36.9 C) 98.4 F (36.9 C) 98.6 F (37 C) 98.2 F (36.8 C)  TempSrc:      Resp: 19 17 18 18   Height:      Weight:      SpO2: 100% 100% 100% 100%   Physical Exam: Vitals reviewed. General: resting in bed, NAD HEENT: PERRL, EOMI, no scleral icterus Cardiac: RRR, no rubs, murmurs or gallops Pulm: clear to auscultation bilaterally, no wheezes, rales, or rhonchi Abd: soft, milld RLQ tenderness, nondistended, BS present Ext: warm and well perfused, no pedal edema Neuro: alert and oriented X3, cranial nerves II-XII grossly intact, strength and sensation to light touch equal in bilateral upper and lower extremities  Lab Results: Reviewed and documented in Electronic Record Micro Results: Reviewed and documented in Electronic Record Studies/Results: Reviewed and documented in Electronic Record Medications: I have reviewed the patient's current medications. Scheduled Meds:   . ciprofloxacin  500 mg Oral BID  . cyclobenzaprine  5 mg Oral TID  . enoxaparin (LOVENOX) injection  40 mg Subcutaneous Q24H  . HYDROmorphone      . medroxyPROGESTERone  150 mg Intramuscular Once  . pantoprazole  40 mg Oral Q1200   Continuous Infusions:   . sodium chloride 75 mL/hr at 02/06/12 1900   PRN Meds:.albuterol, ondansetron (ZOFRAN) IV, ondansetron, oxyCODONE-acetaminophen Assessment/Plan: Ms. Behanna is a 31 year old woman who presents with right flank pain, nausea,  and vomiting.  CT scan revealed mesenteric adenitis.  1.  Mesenteric adenitis:  CT scan on admission was positive for mesenteric adenitis but negative for other acute pathology including gall bladder disease or appendicitis.  She was started on fluids, Cipro, and clear liquid diet with pain control and has done well since admission.  Her diet was advanced yesterday and today she is tolerating full diet without much discomfort.  Urine culture grew E. Coli with pending sensitivities.  Blood cultures are negative to date and she has remained afebrile.  We will plan to d/c today with a total of 10 days of cipro.    2. Pyuria:  ON admission she had findings of pyuria and hematuria on her UA.  She denied symptoms of a UTI throughout admission.  The cipro we are treating #1 with will cover her UTI most likely.  She has a follow up appointment for Monday and we will have her PCP follow the results of the sensitivities of her urine pathogen to ensure she is adequately treated.    3. Hx of Erosive esophagitis:  She has a history of erosive esophagitis and is followed by Dr. Arlyce Dice with her last scope in 2010.  She has complained of some mild reflux symptoms and was maintained on her home Protonix dose of 40 mg daily.   4.  VTE: Lovenox  5. Dispo: Home today with follow up with her PCP on Monday.    LOS: 2 days   Perez Dirico 02/07/2012, 1:15 PM

## 2012-02-11 ENCOUNTER — Ambulatory Visit (INDEPENDENT_AMBULATORY_CARE_PROVIDER_SITE_OTHER): Payer: Self-pay | Admitting: Internal Medicine

## 2012-02-11 ENCOUNTER — Encounter: Payer: Self-pay | Admitting: Internal Medicine

## 2012-02-11 VITALS — BP 120/81 | HR 71 | Temp 97.3°F | Resp 20 | Ht 66.0 in | Wt 195.6 lb

## 2012-02-11 DIAGNOSIS — K029 Dental caries, unspecified: Secondary | ICD-10-CM | POA: Insufficient documentation

## 2012-02-11 DIAGNOSIS — N390 Urinary tract infection, site not specified: Secondary | ICD-10-CM

## 2012-02-11 DIAGNOSIS — I880 Nonspecific mesenteric lymphadenitis: Secondary | ICD-10-CM

## 2012-02-11 LAB — CULTURE, BLOOD (ROUTINE X 2): Culture: NO GROWTH

## 2012-02-11 NOTE — Progress Notes (Signed)
Subjective:   Patient ID: [name] female   DOB: [birth year] 31 y.o.   MRN: [id number]  HPI: [name] is a 31 y.o. woman with PMH of GERD, IBS, migraine headaches and recent discharge after hospitalization for UTI and associated RLQ abd pain 2/2 Mesenteric Adenitis on 02/07/12. She was discharged with a 7 day course of Cipro.  Patient is doing ok. States that her RLQ abd pain is almost resolved.  No c/o dysuria, urinary frequency or urgency. Denies fever or chills. Reports medical compliance with her ABX treatment.  Patient reports occasional left ear minimum pain with swallowing. No Ear discharge. Denies cold like symptoms. Denies any history of otitis media.  She also reports that her "front teeth" are very sensitive to cold drinks and thinks that she may have cavities. Would like to be referred to dentist.   Review of Systems:  Constitutional:  Denies fever, chills, diaphoresis, appetite change and fatigue.   HEENT:  Denies congestion, sore throat, rhinorrhea, sneezing, mouth sores, trouble swallowing, neck pain. Positive for teeth aching. Left ear pain with swallow  Respiratory:  Denies SOB, DOE, cough, and wheezing.   Cardiovascular:  Denies palpitations and leg swelling.   Gastrointestinal:  Denies nausea, vomiting, abdominal pain, diarrhea, constipation, blood in stool and abdominal distention.   Genitourinary:  Denies dysuria, urgency, frequency, hematuria, flank pain and difficulty urinating.   Musculoskeletal:  Denies myalgias, back pain, joint swelling, arthralgias and gait problem.   Skin:  Denies pallor, rash and wound.   Neurological:  Denies dizziness, seizures, syncope, weakness, light-headedness, numbness and headaches.    .      Past Medical History  Diagnosis Date  . GERD (gastroesophageal reflux disease)   . Erosive esophagitis     distal esophagus, with low grade bleed 03/2009  . IBS (irritable bowel syndrome)   . Anemia   . History of  migraine headaches   . Asthma   . Headache    Current Outpatient Prescriptions  Medication Sig Dispense Refill  . acetaminophen-codeine (TYLENOL #3) 300-30 MG per tablet Take 1 tablet by mouth 2 (two) times daily as needed for pain.  60 tablet  1  . albuterol (PROVENTIL HFA;VENTOLIN HFA) 108 (90 BASE) MCG/ACT inhaler Inhale 2 puffs into the lungs every 6 (six) hours as needed. For shortness of breath  3 Inhaler  1  . ciprofloxacin (CIPRO) 500 MG tablet Take 1 tablet (500 mg total) by mouth 2 (two) times daily.  14 tablet  0  . cyclobenzaprine (FLEXERIL) 5 MG tablet Take 1 tablet (5 mg total) by mouth 3 (three) times daily.  30 tablet  0  . pantoprazole (PROTONIX) 40 MG tablet Take 1 tablet (40 mg total) by mouth daily at 12 noon.  30 tablet  1   Family History  Problem Relation Age of Onset  . Heart disease Mother   . Migraines Mother   . Migraines Father   . Asthma Daughter    History   Social History  . Marital Status: Legally Separated    Spouse Name: N/A    Number of Children: N/A  . Years of Education: N/A   Social History Main Topics  . Smoking status: Never Smoker   . Smokeless tobacco: Never Used  . Alcohol Use: No     occasional on holidays  . Drug Use: No  . Sexually Active: Yes -- Female partner(s)    Birth Control/ Protection: Injection   Other Topics Concern  . None  Social History Narrative   [place] in [place], with 2 childrenWorks [employer] at [employer]    Review of Systems: See HPI Objective:  Physical Exam: Filed Vitals:   02/11/12 1339  BP: 120/81  Pulse: 71  Temp: 97.3 F (36.3 C)  TempSrc: Oral  Resp: 20  Height: 5\' 6"  (1.676 m)  Weight: 195 lb 9.6 oz (88.724 kg)  SpO2: 100%   General: alert, well-developed, and cooperative to examination.  Head: normocephalic and atraumatic.  Ear: no erythema, swelling, tenderness or warmth to touch. No discharge. Tympanic membrane intact with positive light reflex.    Mouth: pharynx pink and moist, no erythema, and no exudates.  Neck: supple, full ROM, no thyromegaly, no JVD, and no carotid bruits.  Lungs: normal respiratory effort, no accessory muscle use, normal breath sounds, no crackles, and no wheezes. Heart: normal rate, regular rhythm, no murmur, no gallop, and no rub.  Abdomen: soft, non-tender, normal bowel sounds, no distention, no guarding, no rebound tenderness, no hepatomegaly, and no splenomegaly.  Msk: no joint swelling, no joint warmth, and no redness over joints.  Pulses: 2+ DP/PT pulses bilaterally Extremities: No cyanosis, clubbing, edema Neurologic: alert & oriented X3, cranial nerves II-XII intact, strength normal in all extremities, sensation intact to light touch, and gait normal.  Skin: turgor normal and no rashes.  Psych: Oriented X3, memory intact for recent and remote, normally interactive, good eye contact, not anxious appearing, and not depressed appearing.   Assessment & Plan:

## 2012-02-11 NOTE — Assessment & Plan Note (Signed)
Patient was treated with Cipro during the admission and will finish her course at the end of this week. She is asymptomatic today.  - will repeat her UA next Monday when she is done with her ABX treatment. - UA is ordered for 02/18/12

## 2012-02-11 NOTE — Patient Instructions (Addendum)
  1.  will send your for dental appt 2. You have a appt with Dr. Garald Braver in September 16 th. Please follow up.

## 2012-02-11 NOTE — Assessment & Plan Note (Addendum)
Reports front teeth ( upper central incisor) sensitivity and occasional aching. Has not seen dentist for a while.   - will send dentist referral.

## 2012-02-11 NOTE — Assessment & Plan Note (Signed)
Noted on Abd CT which is thought to be associated with her UTI.  Patient states that her RLQ abdominal pain is almost resolved now.   No other c/o  - she is instructed to finish her ABX course. And follow up her UA on 02/18/12.

## 2012-03-05 ENCOUNTER — Other Ambulatory Visit: Payer: Self-pay | Admitting: *Deleted

## 2012-03-05 NOTE — Telephone Encounter (Signed)
Pt needs to be seen to discuss management of headache with medication other than opioid Tylenol #3.

## 2012-03-06 NOTE — Telephone Encounter (Signed)
Pt was called, no answer - message left to inform pt of denial of Tylenol #3 refill and to keep her appt  Sept 16 w/Dr Garald Braver.

## 2012-03-17 ENCOUNTER — Ambulatory Visit (INDEPENDENT_AMBULATORY_CARE_PROVIDER_SITE_OTHER): Payer: Self-pay | Admitting: Internal Medicine

## 2012-03-17 ENCOUNTER — Encounter: Payer: Self-pay | Admitting: Internal Medicine

## 2012-03-17 VITALS — BP 123/81 | HR 78 | Temp 98.2°F | Ht 66.0 in | Wt 187.4 lb

## 2012-03-17 DIAGNOSIS — J45909 Unspecified asthma, uncomplicated: Secondary | ICD-10-CM

## 2012-03-17 DIAGNOSIS — R51 Headache: Secondary | ICD-10-CM

## 2012-03-17 DIAGNOSIS — G8929 Other chronic pain: Secondary | ICD-10-CM

## 2012-03-17 DIAGNOSIS — G43909 Migraine, unspecified, not intractable, without status migrainosus: Secondary | ICD-10-CM

## 2012-03-17 DIAGNOSIS — K029 Dental caries, unspecified: Secondary | ICD-10-CM

## 2012-03-17 MED ORDER — SUMATRIPTAN SUCCINATE 50 MG PO TABS: 50.0000 mg | ORAL_TABLET | ORAL | Status: DC | PRN

## 2012-03-17 MED ORDER — PROPRANOLOL HCL 40 MG PO TABS: 40.0000 mg | ORAL_TABLET | Freq: Two times a day (BID) | ORAL | Status: DC

## 2012-03-17 MED ORDER — ACETAMINOPHEN-CODEINE #3 300-30 MG PO TABS: 1.0000 | ORAL_TABLET | Freq: Two times a day (BID) | ORAL | Status: DC | PRN

## 2012-03-17 NOTE — Patient Instructions (Signed)
Take sumatriptan for acute migraines. If it does not work in 30 minutes, you may take Tylenol #3.  Keep a migraine headache, noting the dates, duration, and triggers for your migraines.

## 2012-03-18 ENCOUNTER — Telehealth: Payer: Self-pay | Admitting: *Deleted

## 2012-03-18 DIAGNOSIS — G43909 Migraine, unspecified, not intractable, without status migrainosus: Secondary | ICD-10-CM

## 2012-03-18 NOTE — Telephone Encounter (Addendum)
Pt gets narcotic from [facility], she may get others but they are not going to be cheap, she goes [facility] meds, she was quite angry at the pharm because the imitrex and inderal were not sent to health dept. i have called the health dept and the imitrex needs to be changed to the imitrex nose spray. i will call to health dept if you change it, thanks, h.   Pt had specifically requested that her new prescriptions be sent to [facility]. Will be glad to send prescriptions to health dept. Dr. Kirtland Bouchard

## 2012-03-19 MED ORDER — SUMATRIPTAN 5 MG/ACT NA SOLN: 1.0000 | NASAL | Status: DC | PRN

## 2012-03-19 MED ORDER — PROPRANOLOL HCL 40 MG PO TABS: 40.0000 mg | ORAL_TABLET | Freq: Two times a day (BID) | ORAL | Status: DC

## 2012-03-19 NOTE — Assessment & Plan Note (Signed)
Pt has had teeth sensitivity for months now. Will refer to dental clinic, pt has [facility].

## 2012-03-19 NOTE — Progress Notes (Signed)
  Subjective:    Patient ID: [name], female    DOB: [birth year], 31 y.o.   MRN: [id number]  HPI  [name] is a 31 yo woman with PMH significant for migraines who comes in today for further evaluation and discussion of her migraines. She does not keep a headache diary and is not sure how many times per month she experiences her migraine headaches. She saw a Neurologist years ago in 2003-2004, who had her try topamax, gabapentin, and amitrypline for headache prophylaxis. She has been on Tylenol #3 for months now and reports that it is the only medicine that has worked for her headaches. She takes Tylenol #3 only when she has a migraine. Her migraines do not occur every day but last for hours, however. She reports aura with the headaches, photophobia, and sometimes throbbing sensation in the left parietal area. She does not keep a headache diary and could not tell how many times per month she has migraines. She has not seen a Neurologist recently as she does not have medical insurance and could not afford to pay for an office visit.    Review of Systems  Constitutional: Negative for fever, activity change, appetite change and fatigue.  HENT: Negative for congestion and neck pain.   Eyes: Negative for pain.  Respiratory: Negative for cough and shortness of breath.   Cardiovascular: Negative for chest pain.  Gastrointestinal: Negative for nausea, vomiting, abdominal pain and anal bleeding.  Genitourinary: Negative for dysuria and hematuria.  Neurological: Positive for headaches. Negative for dizziness, weakness and numbness.       Objective:   Physical Exam  Constitutional: She is oriented to person, place, and time. She appears well-nourished. No distress.  HENT:       Superior incisor teeth with caries   Eyes: Conjunctivae normal are normal. Right eye exhibits no discharge. Left eye exhibits no discharge. No scleral icterus.  Cardiovascular: Normal rate, regular rhythm and normal  heart sounds.   Pulmonary/Chest: Effort normal and breath sounds normal. No respiratory distress. She has no wheezes.  Abdominal: Soft. Bowel sounds are normal. There is no tenderness.  Musculoskeletal: She exhibits no edema.  Neurological: She is alert and oriented to person, place, and time.  Skin: Skin is warm and dry. She is not diaphoretic.  Psychiatric:       Irritable           Assessment & Plan:

## 2012-03-19 NOTE — Addendum Note (Signed)
Addended by: Sara Chu D on: 03/19/2012 04:18 PM   Modules accepted: Orders

## 2012-03-19 NOTE — Assessment & Plan Note (Signed)
Pt was instructed to keep a headache diary for monitoring a migraine frequency, triggers, and accompanying symptoms. She has the [facility] card however, there are currently no Neurologist that will see her in this service network. She is willing to go to [facility] for a Neurological appointment. She will be referred today. Discussed need for migraine prophylaxis, she does not want to try Topamax again but would like to try propranolol (she describes not having asthma attacks or asthma symptoms recently and denies cocaine use). She understands the need to be weaned off Tylenol #3 and is willing to try sumatriptan for migraines. She was given a prescription for Tylenol #3 PRN #30 (half the quantity she is usually prescribed) with instructions to take imitrex PRN for migraines but followed by Tylenol #3 30 minutes later if this medication does not work. The side effects of both inderal and imitrex were discussed with her and she understood them. She was asked to return in 1 month for re-evaluation of her migraine medication regimen.   Addendum: Although she has specifically requested that all her medications be sent to [facility] she called and requested that inderal and imitrex be [facility] to the [facility].

## 2012-03-19 NOTE — Telephone Encounter (Signed)
Called to pharm 

## 2012-03-19 NOTE — Assessment & Plan Note (Signed)
No active symptoms does not report shortness of breath or need for using albuterol inhaler.

## 2012-03-20 ENCOUNTER — Other Ambulatory Visit: Payer: Self-pay | Admitting: Internal Medicine

## 2012-03-20 DIAGNOSIS — R112 Nausea with vomiting, unspecified: Secondary | ICD-10-CM

## 2012-03-20 NOTE — Progress Notes (Signed)
INTERNAL MEDICINE TEACHING ATTENDING ADDENDUM Lonzo Cloud , MD: I personally saw and evaluated Ms.Grupp in this clinic visit in conjunction with the resident, Dr. Wyn Quaker. I have discussed the patient's plan of care with Dr. Wyn Quaker during this visit. I have confirmed the physical exam findings and have read and agree with the clinic note including the plan.

## 2012-03-31 ENCOUNTER — Encounter: Payer: Self-pay | Admitting: Internal Medicine

## 2012-03-31 ENCOUNTER — Ambulatory Visit (INDEPENDENT_AMBULATORY_CARE_PROVIDER_SITE_OTHER): Payer: Self-pay | Admitting: Internal Medicine

## 2012-03-31 VITALS — BP 115/76 | HR 86 | Temp 97.7°F | Wt 189.6 lb

## 2012-03-31 DIAGNOSIS — J019 Acute sinusitis, unspecified: Secondary | ICD-10-CM

## 2012-03-31 MED ORDER — PANTOPRAZOLE SODIUM 40 MG PO TBEC: 40.0000 mg | DELAYED_RELEASE_TABLET | Freq: Every day | ORAL | Status: DC

## 2012-03-31 NOTE — Patient Instructions (Signed)
1.  You have a Sinus infection.  These are usually caused by viruses.  Symptomatic treatment is best for these  - Ibuprofen 400-600 mg three times daily with food  - Make sure you are taking the Protonix 40 mg daily while you are on the ibuprofen  - Mucinex D (get the generic, it works just as well)  - Use a saline nasal spray as well as saline gargles for the nose and throat.  - Get plenty of rest and drink plenty of water.  2.  If this does not improve over the next 5-7 days please come back to see if you need an antibiotic.    3.  Continue your other medications as prescribed.    4.  Follow up with your primary doctor in about 1 month to recheck how you are doing on the headache medications.   Sinusitis Sinuses are air pockets within the bones of your face. The growth of bacteria within a sinus leads to infection. The infection prevents the sinuses from draining. This infection is called sinusitis. SYMPTOMS  There will be different areas of pain depending on which sinuses have become infected.  The maxillary sinuses often produce pain beneath the eyes.   Frontal sinusitis may cause pain in the middle of the forehead and above the eyes.  Other problems (symptoms) include:  Toothaches.   Colored, pus-like (purulent) drainage from the nose.   Swelling, warmth, and tenderness over the sinus areas may be signs of infection.  TREATMENT  Sinusitis is most often determined by an exam.X-rays may be taken. If x-rays have been taken, make sure you obtain your results or find out how you are to obtain them. Your caregiver may give you medications (antibiotics). These are medications that will help kill the bacteria causing the infection. You may also be given a medication (decongestant) that helps to reduce sinus swelling.  HOME CARE INSTRUCTIONS   Only take over-the-counter or prescription medicines for pain, discomfort, or fever as directed by your caregiver.   Drink extra fluids. Fluids  help thin the mucus so your sinuses can drain more easily.   Applying either moist heat or ice packs to the sinus areas may help relieve discomfort.   Use saline nasal sprays to help moisten your sinuses. The sprays can be found at your local drugstore.  SEEK IMMEDIATE MEDICAL CARE IF:  You have a fever.   You have increasing pain, severe headaches, or toothache.   You have nausea, vomiting, or drowsiness.   You develop unusual swelling around the face or trouble seeing.  MAKE SURE YOU:   Understand these instructions.   Will watch your condition.   Will get help right away if you are not doing well or get worse.  Document Released: 06/18/2005 Document Revised: 06/07/2011 Document Reviewed: 01/15/2007 [facility] Patient Information 2012 [place], [place].

## 2012-03-31 NOTE — Progress Notes (Signed)
Subjective:   Patient ID: [name] female   DOB: Dec 12, 1980 31 y.o.   MRN: [id number]  HPI: [name] is a 31 y.o. woman who presents to the clinic today complaining of a sore throat, sinus pressure, post nasal drop, and dysphagia that started Friday prior to her visit.  She states that she hasn't been around anyone who was sick.  She states that the drainage has been a whitish to clear drainage.  She denies itchy watery eyes, body aches, fever, or chills.   Past Medical History  Diagnosis Date  . GERD (gastroesophageal reflux disease)   . Erosive esophagitis     distal esophagus, with low grade bleed 03/2009  . IBS (irritable bowel syndrome)   . Anemia   . History of migraine headaches   . Asthma   . Headache    Current Outpatient Prescriptions  Medication Sig Dispense Refill  . acetaminophen-codeine (TYLENOL #3) 300-30 MG per tablet Take 1 tablet by mouth 2 (two) times daily as needed for pain.  30 tablet  1  . albuterol (PROVENTIL HFA;VENTOLIN HFA) 108 (90 BASE) MCG/ACT inhaler Inhale 2 puffs into the lungs every 6 (six) hours as needed. For shortness of breath  3 Inhaler  1  . pantoprazole (PROTONIX) 40 MG tablet Take 1 tablet (40 mg total) by mouth daily at 12 noon.  30 tablet  1  . propranolol (INDERAL) 40 MG tablet Take 1 tablet (40 mg total) by mouth 2 (two) times daily.  60 tablet  3  . SUMAtriptan (IMITREX) 5 MG/ACT nasal spray Place 1 spray (5 mg total) into the nose every 2 (two) hours as needed for migraine (one spray in 1 nostril. May use this for a maximum daily dose of 40mg  ).  1 Inhaler  1   Family History  Problem Relation Age of Onset  . Heart disease Mother   . Migraines Mother   . Migraines Father   . Asthma Daughter    History   Social History  . Marital Status: Legally Separated    Spouse Name: N/A    Number of Children: N/A  . Years of Education: N/A   Social History Main Topics  . Smoking status: Never Smoker   . Smokeless  tobacco: Never Used  . Alcohol Use: No     occasional on holidays  . Drug Use: No  . Sexually Active: Yes -- Female partner(s)    Birth Control/ Protection: Injection   Other Topics Concern  . None   Social History Narrative   Lives in [place], with 2 childrenWorks [employer] at [employer]    Review of Systems: A full 12 system ROS is negative except as noted in the HPI and A&P.   Objective:  Physical Exam: Filed Vitals:   03/31/12 1335  BP: 115/76  Pulse: 86  Temp: 97.7 F (36.5 C)  TempSrc: Oral  Weight: 189 lb 9.6 oz (86.002 kg)   Constitutional: Vital signs reviewed.  Patient is a well-developed and well-nourished woman in no acute distress and cooperative with exam. Alert and oriented x3.  Head: Normocephalic and atraumatic, mild pain in the maxillary sinuses with palpation.  Ear: TM normal bilaterally Mouth: mild erythema in the posterior pharynx.  no exudates, MMM Nose: boggy turbinates with clear drainage noted.   Eyes: PERRL, EOMI, conjunctivae injected.  No scleral icterus.  Neck: Supple, Trachea midline normal ROM, No JVD, mass, thyromegaly, or carotid bruit present.  Cardiovascular: RRR,  S1 normal, S2 normal, no MRG, pulses symmetric and intact bilaterally Pulmonary/Chest: CTAB, no wheezes, rales, or rhonchi Abdominal: Soft. Non-tender, non-distended, bowel sounds are normal, no masses, organomegaly, or guarding present.  GU: no CVA tenderness Musculoskeletal: No joint deformities, erythema, or stiffness, ROM full and no nontender Hematology: no cervical, inginal, or axillary adenopathy.  Neurological: A&O x3, Strength is normal and symmetric bilaterally, cranial nerve II-XII are grossly intact, no focal motor deficit, sensory intact to light touch bilaterally.  Skin: Warm, dry and intact. No rash, cyanosis, or clubbing.  Psychiatric: Normal mood and affect. speech and behavior is normal. Judgment and thought content normal. Cognition  and memory are normal.   Assessment & Plan:

## 2012-04-02 ENCOUNTER — Encounter: Payer: Self-pay | Admitting: *Deleted

## 2012-04-02 NOTE — Progress Notes (Signed)
Pt stopped by triage asking for a note to return to work s/p clinic visit on 9/30 for sinusitis.  Pt wants to return to work on Friday. She is feeling alittle better but still has bouts of sweating.  Pt advised to go home and rest up following Dr Donnelly Stager instructions and if better and feels able to return to work let us know.

## 2012-04-07 ENCOUNTER — Telehealth: Payer: Self-pay | Admitting: *Deleted

## 2012-04-07 NOTE — Telephone Encounter (Signed)
Pt calls and states she had migraine fri, sat and sun, she took imitrex tablet fri and has not taken any since because pharm told her should could only take 2 /week. i spoke w/ Emelda Fear and she states pt may take up to 200mg  daily and if she runs out of tablets before the spray comes in they will provide more tablets if there are samples available, pt is informed and is cautioned not to use daily she is agreeable and happy

## 2012-04-14 NOTE — Addendum Note (Signed)
Addended by: Bufford Spikes on: 04/14/2012 03:43 PM   Modules accepted: Orders

## 2012-04-29 ENCOUNTER — Other Ambulatory Visit: Payer: Self-pay | Admitting: Internal Medicine

## 2012-04-29 ENCOUNTER — Ambulatory Visit (INDEPENDENT_AMBULATORY_CARE_PROVIDER_SITE_OTHER): Payer: Self-pay | Admitting: *Deleted

## 2012-04-29 DIAGNOSIS — Z309 Encounter for contraceptive management, unspecified: Secondary | ICD-10-CM

## 2012-04-29 DIAGNOSIS — Z3049 Encounter for surveillance of other contraceptives: Secondary | ICD-10-CM

## 2012-04-29 MED ORDER — MEDROXYPROGESTERONE ACETATE 150 MG/ML IM SUSP
150.0000 mg | Freq: Once | INTRAMUSCULAR | Status: AC
  Administered 2012-04-29: 150 mg via INTRAMUSCULAR

## 2012-04-29 MED ORDER — MEDROXYPROGESTERONE ACETATE 150 MG/ML IM SUSP: 150.0000 mg | INTRAMUSCULAR | Status: DC

## 2012-04-30 ENCOUNTER — Ambulatory Visit: Payer: Self-pay

## 2012-05-08 ENCOUNTER — Ambulatory Visit (INDEPENDENT_AMBULATORY_CARE_PROVIDER_SITE_OTHER): Payer: Self-pay | Admitting: Internal Medicine

## 2012-05-08 ENCOUNTER — Encounter: Payer: Self-pay | Admitting: Internal Medicine

## 2012-05-08 ENCOUNTER — Encounter: Payer: Self-pay | Admitting: *Deleted

## 2012-05-08 ENCOUNTER — Other Ambulatory Visit: Payer: Self-pay | Admitting: *Deleted

## 2012-05-08 VITALS — BP 125/81 | HR 89 | Temp 97.7°F | Ht 66.0 in | Wt 190.1 lb

## 2012-05-08 DIAGNOSIS — J040 Acute laryngitis: Secondary | ICD-10-CM

## 2012-05-08 DIAGNOSIS — R0602 Shortness of breath: Secondary | ICD-10-CM

## 2012-05-08 DIAGNOSIS — G43909 Migraine, unspecified, not intractable, without status migrainosus: Secondary | ICD-10-CM

## 2012-05-08 MED ORDER — ALBUTEROL SULFATE HFA 108 (90 BASE) MCG/ACT IN AERS: 2.0000 | INHALATION_SPRAY | Freq: Four times a day (QID) | RESPIRATORY_TRACT | Status: DC | PRN

## 2012-05-08 MED ORDER — ACETAMINOPHEN-CODEINE #3 300-30 MG PO TABS: 1.0000 | ORAL_TABLET | Freq: Two times a day (BID) | ORAL | Status: DC | PRN

## 2012-05-08 MED ORDER — BENZONATATE 100 MG PO CAPS: 100.0000 mg | ORAL_CAPSULE | Freq: Three times a day (TID) | ORAL | Status: DC | PRN

## 2012-05-08 NOTE — Progress Notes (Unsigned)
Pt presents c/o laryngitis x 2 days with "hard" sometimes productive cough, denies fever, weakness, N&V. Has a job speaking constantly and needs this resolved. appt per [place]. At 0915 dr wallace

## 2012-05-08 NOTE — Patient Instructions (Addendum)
Continue using your albuterol inhaler as you have been.  Use Tylenol for pain and discomfort.  Remember not to take more than 3,000mg  of acetaminophen in 24 hours.  Percocet, Tylenol, and many other medications have acetaminophen in them; so make sure that the total dose from all sources does not add up to more than 3,000mg .  Use throat lozenges as needed.  Take benzonatate (Tessalon) as needed for cough, up to three times a day.  Call the clinic [phone]) if symptoms are not improving by Monday or Tuesday.  Laryngitis  At the top of your windpipe is your voice box. It is the source of your voice. Inside your voice box are 2 bands of muscles called vocal cords. When you breathe, your vocal cords are relaxed and open so that air can get into the lungs. When you decide to say something, these cords come together and vibrate. The sound from these vibrations goes into your throat and comes out through your mouth as sound.  Laryngitis is an inflammation of the vocal cords that causes hoarseness, cough, loss of voice, sore throat, and dry throat. Laryngitis can be temporary (acute) or long-term (chronic). Most cases of acute laryngitis improve with time.Chronic laryngitis lasts for more than 3 weeks.  CAUSES  Laryngitis can often be related to excessive smoking, talking, or yelling, as well as inhalation of toxic fumes and allergies. Acute laryngitis is usually caused by a viral infection, vocal strain, measles or mumps, or bacterial infections. Chronic laryngitis is usually caused by vocal cord strain, vocal cord injury, postnasal drip, growths on the vocal cords, or acid reflux.  SYMPTOMS  Cough.  Sore throat.  Dry throat.  RISK FACTORS  Respiratory infections.  Exposure to irritating substances, such as cigarette smoke, excessive amounts of alcohol, stomach acids, and workplace chemicals.  Voice trauma, such as vocal cord injury from shouting or speaking too loud.  DIAGNOSIS  Your cargiver will  perform a physical exam. During the physical exam, your caregiver will examine your throat. The most common sign of laryngitis is hoarseness. Laryngoscopy may be necessary to confirm the diagnosis of this condition. This procedure allows your caregiver to look into the larynx.  HOME CARE INSTRUCTIONS  Drink enough fluids to keep your urine clear or pale yellow.  Rest until you no longer have symptoms or as directed by your caregiver.  Breathe in moist air.  Take all medicine as directed by your caregiver.  Do not smoke.  Talk as little as possible (this includes whispering).  Write on paper instead of talking until your voice is back to normal.  Follow up with your caregiver if your condition has not improved after 10 days.  SEEK MEDICAL CARE IF:  You have trouble breathing.  You cough up blood.  You have persistent fever.  You have increasing pain.  You have difficulty swallowing.  MAKE SURE YOU:  Understand these instructions.  Will watch your condition.  Will get help right away if you are not doing well or get worse.  Document Released: 06/18/2005 Document Revised: 09/10/2011 Document Reviewed: 08/24/2010  [facility] Patient Information 2013 [place], [place].

## 2012-05-08 NOTE — Progress Notes (Signed)
  Subjective:    Patient ID: [name], female    DOB: 1980-09-28, 31 y.o.   MRN: [id number]  CC: hoarseness   HPI:  This is a 31 year old woman with asthma who presents with hoarseness, cough, and sore throat.  Her first symptom was cough, which started 3 days ago.  Three days ago, she also experienced one episode of diarrhea and had body aches that last 1 day.  Her cough has persisted to today.  Two days ago she began experiencing a sore throat that has now partially resolved, a drainage sensation in her throat that persists, and a hoarse voice that persists.  She has also been having increased dyspnea for which she has used her albuterol inhaler.  She has tried throat lozenges without relief.  She denies headaches, watery eyes, ear pain, ear fullness, sinus congestion, sinus pressure, sneezing, nausea, vomiting, and fevers.  She did have an episode of diaphoresis and then chills yesterday.   Social History Narrative   [place], with 2 childrenWorks at [employer], job requires frequent phone calling, works Tuesday - Friday      Review of Systems  Constitutional: Positive for chills and diaphoresis. Negative for fever.  HENT: Positive for sore throat, voice change and postnasal drip. Negative for hearing loss, ear pain, congestion, rhinorrhea, sneezing, sinus pressure and ear discharge.   Eyes: Negative for discharge, itching and visual disturbance.  Respiratory: Positive for cough and shortness of breath.   Cardiovascular: Negative for chest pain.  Gastrointestinal: Positive for diarrhea. Negative for nausea, vomiting, abdominal pain and constipation.  Musculoskeletal: Positive for myalgias.  Neurological: Negative for headaches.       Objective:   Physical Exam GENERAL: well developed, well nourished; no acute distress; voice is hoarse HEAD: atraumatic, normocephalic, no frontal or ethmoid sinus tenderness, mild tenderness of right maxillary sinus, left maxillary  sinus non-tender EYES: pupils equal, round and reactive; sclera anicteric; normal conjunctiva EARS: canals and TMs normal bilaterally NOSE/THROAT: nasal turbinates are erythematous and swollen, oropharynx is slightly erythematous without exudates, no tonsillar swelling NECK: supple, no carotid bruits, thyroid normal in size and without palpable nodules LYMPH: no cervical, preauricular, retroauricular, submandibular, submental  or supraclavicular lymphadenopathy LUNGS: clear to auscultation bilaterally, normal work of breathing HEART: normal rate and regular rhythm; normal S1 and S2 without S3 or S4; no murmurs, rubs, or clicks ABDOMEN: soft, non-tender   Filed Vitals:   05/08/12 0913  BP: 125/81  Pulse: 89  Temp: 97.7 F (36.5 C)              Assessment & Plan:

## 2012-05-08 NOTE — Assessment & Plan Note (Signed)
This is classic presentation of an upper respiratory infection and acute laryngitis.  Overwhelmingly, the etiology of this is viral infection.  Occasionally, Moraxella catarrhalis, Haemophilus influenzae, and Streptococcus pneumoniae can be implicated but treatment with antibiotics has not been shown to be helpful.  Her pulmonary exam in the office today is not concerning for an asthma exacerbation, pneumonia, or bronchitis.  I have counseled her on symptomatic treatment with over-the-counter remedies and I will prescribe benzonatate capsules for cough suppression.  I also encouraged her to continue using albuterol as needed; she has run out of this, so we provided her with a sample albuterol inhaler to take home.  If she feels no better by Monday or Tuesday next week, she will call the office and we can consider a course of azithromycin to cover the above mentioned bacterial pathogens.  A work note was provided for today and tomorrow. - benzonatate capsules for cough relief - acetaminophen and throat lozenges PRN - albuterol inhaler sample provided for PRN use

## 2012-05-08 NOTE — Telephone Encounter (Signed)
Rx called in 

## 2012-05-27 ENCOUNTER — Ambulatory Visit: Payer: Self-pay

## 2012-06-02 ENCOUNTER — Ambulatory Visit: Payer: Self-pay

## 2012-06-02 DIAGNOSIS — M549 Dorsalgia, unspecified: Secondary | ICD-10-CM

## 2012-07-03 ENCOUNTER — Other Ambulatory Visit: Payer: Self-pay | Admitting: *Deleted

## 2012-07-03 DIAGNOSIS — G43909 Migraine, unspecified, not intractable, without status migrainosus: Secondary | ICD-10-CM

## 2012-07-03 MED ORDER — ACETAMINOPHEN-CODEINE #3 300-30 MG PO TABS: 1.0000 | ORAL_TABLET | Freq: Two times a day (BID) | ORAL | Status: DC | PRN

## 2012-07-04 NOTE — Telephone Encounter (Signed)
Called to pharm 

## 2012-07-22 ENCOUNTER — Ambulatory Visit: Payer: No Typology Code available for payment source | Admitting: *Deleted

## 2012-07-25 ENCOUNTER — Ambulatory Visit (INDEPENDENT_AMBULATORY_CARE_PROVIDER_SITE_OTHER): Payer: No Typology Code available for payment source | Admitting: *Deleted

## 2012-07-25 DIAGNOSIS — Z309 Encounter for contraceptive management, unspecified: Secondary | ICD-10-CM

## 2012-07-25 MED ORDER — MEDROXYPROGESTERONE ACETATE 150 MG/ML IM SUSP
150.0000 mg | Freq: Once | INTRAMUSCULAR | Status: AC
  Administered 2012-07-25: 150 mg via INTRAMUSCULAR

## 2012-07-25 MED ORDER — MEDROXYPROGESTERONE ACETATE 150 MG/ML IM SUSP: 150.0000 mg | INTRAMUSCULAR | Status: DC

## 2012-07-25 MED ORDER — MEDROXYPROGESTERONE ACETATE 104 MG/0.65ML ~~LOC~~ SUSP: 104.0000 mg | SUBCUTANEOUS | Status: DC

## 2012-07-25 NOTE — Progress Notes (Signed)
Patient ID: [name], female   DOB: 04-10-1981, 32 y.o.   MRN: [id number]

## 2012-07-25 NOTE — Progress Notes (Signed)
Pt cancelled this appointmnet

## 2012-07-29 ENCOUNTER — Telehealth: Payer: Self-pay | Admitting: *Deleted

## 2012-07-29 NOTE — Telephone Encounter (Signed)
Pt calls and states her back pain is getting worse, cannot sleep, appt w/ dr Burtis Junes 1/29 at 1315 per charsettah. She is ask to be on time and if she becomes worse may go to ED or urg care she is agreeable

## 2012-07-30 ENCOUNTER — Ambulatory Visit (INDEPENDENT_AMBULATORY_CARE_PROVIDER_SITE_OTHER): Payer: No Typology Code available for payment source | Admitting: Internal Medicine

## 2012-07-30 ENCOUNTER — Encounter: Payer: Self-pay | Admitting: Internal Medicine

## 2012-07-30 VITALS — BP 123/79 | HR 68 | Temp 97.4°F | Ht 65.0 in | Wt 189.3 lb

## 2012-07-30 DIAGNOSIS — G43909 Migraine, unspecified, not intractable, without status migrainosus: Secondary | ICD-10-CM

## 2012-07-30 DIAGNOSIS — M549 Dorsalgia, unspecified: Secondary | ICD-10-CM

## 2012-07-30 DIAGNOSIS — M5380 Other specified dorsopathies, site unspecified: Secondary | ICD-10-CM

## 2012-07-30 MED ORDER — ACETAMINOPHEN-CODEINE #3 300-30 MG PO TABS: 1.0000 | ORAL_TABLET | Freq: Two times a day (BID) | ORAL | Status: DC | PRN

## 2012-07-30 MED ORDER — TRAMADOL HCL 50 MG PO TABS: 50.0000 mg | ORAL_TABLET | Freq: Four times a day (QID) | ORAL | Status: DC | PRN

## 2012-07-30 MED ORDER — CYCLOBENZAPRINE HCL 10 MG PO TABS: 10.0000 mg | ORAL_TABLET | Freq: Three times a day (TID) | ORAL | Status: DC | PRN

## 2012-07-30 NOTE — Progress Notes (Signed)
Subjective:   Patient ID: [name] female   DOB: 05-Apr-1981 32 y.o.   MRN: [id number]  HPI: [name] is a 32 y.o. woman with past medical history of chronic migraines, IBS, and asthma presenting for an acute visit of back pain. Pain started suddenly after a fall on January 3 the patient will members slipping on some snow. The pain is mostly located on her left side with some slight shooting pain down her left leg that terminates at the knee. The patient has been able to ambulate. Patient denied any other sensory deficits, no problems with ambulation, or urinary or bowel incontinence. The patient denied any saddle anesthesia. The patient has not really tried anything over-the-counter. The patient has a history of an L4-S1 disc bulge back in 2003 where she underwent physical therapy and treatment at sports medicine that included some steroid shots. The patient had not been having any problems since that time until her fall. The patient did state that she bought some Ultram from the street and that seemed to help. The patient has not tried icing or heat. The patient also use her TENS unit but does not any more. During the fall the patient also lost the remaining Tylenol 3 pills that she had and per check from [facility] narcotic database the patient has been compliant and does not seem to be exhibiting drug-seeking behavior. The patient denied any other symptoms of fever/chills, history of IV drug use, nausea vomiting or diarrhea, dysuria or pyuria.    Past Medical History  Diagnosis Date  . GERD (gastroesophageal reflux disease)   . Erosive esophagitis     distal esophagus, with low grade bleed 03/2009  . IBS (irritable bowel syndrome)   . Anemia   . History of migraine headaches   . Asthma   . Headache    Current Outpatient Prescriptions  Medication Sig Dispense Refill  . acetaminophen-codeine (TYLENOL #3) 300-30 MG per tablet Take 1 tablet by mouth 2 (two) times daily  as needed for pain.  30 tablet  1  . albuterol (PROVENTIL HFA;VENTOLIN HFA) 108 (90 BASE) MCG/ACT inhaler Inhale 2 puffs into the lungs every 6 (six) hours as needed. For shortness of breath  1 Inhaler  0  . benzonatate (TESSALON PERLES) 100 MG capsule Take 1 capsule (100 mg total) by mouth 3 (three) times daily as needed for cough.  14 capsule  0  . cyclobenzaprine (FLEXERIL) 10 MG tablet Take 1 tablet (10 mg total) by mouth every 8 (eight) hours as needed for muscle spasms.  30 tablet  1  . medroxyPROGESTERone (DEPO-PROVERA) 150 MG/ML injection Inject 1 mL (150 mg total) into the muscle every 3 (three) months.  1 mL  4  . pantoprazole (PROTONIX) 40 MG tablet Take 1 tablet (40 mg total) by mouth daily at 12 noon.  30 tablet  1  . propranolol (INDERAL) 40 MG tablet Take 1 tablet (40 mg total) by mouth 2 (two) times daily.  60 tablet  3  . SUMAtriptan (IMITREX) 5 MG/ACT nasal spray Place 1 spray (5 mg total) into the nose every 2 (two) hours as needed for migraine (one spray in 1 nostril. May use this for a maximum daily dose of 40mg  ).  1 Inhaler  1  . traMADol (ULTRAM) 50 MG tablet Take 1 tablet (50 mg total) by mouth every 6 (six) hours as needed for pain.  30 tablet  0   Family History  Problem Relation Age of  Onset  . Heart disease Mother   . Migraines Mother   . Migraines Father   . Asthma Daughter    History   Social History  . Marital Status: Legally Separated    Spouse Name: N/A    Number of Children: N/A  . Years of Education: N/A   Social History Main Topics  . Smoking status: Never Smoker   . Smokeless tobacco: Never Used  . Alcohol Use: No     Comment: occasional on holidays  . Drug Use: No  . Sexually Active: Yes -- Female partner(s)    Birth Control/ Protection: Injection   Other Topics Concern  . None   Social History Narrative   Lives in [place], with 2 childrenWorks at [employer], job requires frequent phone calling, works Tuesday - Friday   Review of  Systems: otherwise negative unless listed in HPI  Objective:  Physical Exam: Filed Vitals:   07/30/12 1316  BP: 123/79  Pulse: 68  Temp: 97.4 F (36.3 C)  TempSrc: Oral  Height: 5\' 5"  (1.651 m)  Weight: 189 lb 4.8 oz (85.866 kg)  SpO2: 100%   General: some slight discomfort Cardiac: RRR, no rubs, murmurs or gallops Pulm: clear to auscultation bilaterally, moving normal volumes of air Abd: soft, nontender, nondistended, BS present MS: tightness and tenderness of left paraspinal muscle > right, limited ROM of torso 2/2 pain, no CVA tenderness, normal sensory, normal ambulation,  Ext: warm and well perfused, no pedal edema Neuro: alert and oriented X3, cranial nerves II-XII grossly intact  Assessment & Plan:  1. Back pain secondary to muscle spasm: The seems to be an acute event status post a fall and pt not expressing symptoms or have red flag physical exam findings. The patient was experiencing pain relief with Ultram and rest. The patient does not work with lifting or heavy objects and the pain has not interrupted activities of daily living. -Referral to physical therapy -Ultram 50 mg -Flexeril 10 mg  2.chronic migraines. During the fall the patient lost her last remaining Tylenol 3 pills and after checking narcotic database does not seem to be exhibiting drug-seeking behavior. -Refill for Tylenol #3  Pt discussed with Dr. Criselda Peaches

## 2012-07-31 DIAGNOSIS — M549 Dorsalgia, unspecified: Secondary | ICD-10-CM | POA: Insufficient documentation

## 2012-08-04 ENCOUNTER — Encounter: Payer: Self-pay | Admitting: Internal Medicine

## 2012-08-06 ENCOUNTER — Ambulatory Visit: Payer: No Typology Code available for payment source | Attending: Internal Medicine | Admitting: Rehabilitation

## 2012-08-06 DIAGNOSIS — IMO0001 Reserved for inherently not codable concepts without codable children: Secondary | ICD-10-CM | POA: Insufficient documentation

## 2012-08-06 DIAGNOSIS — R262 Difficulty in walking, not elsewhere classified: Secondary | ICD-10-CM | POA: Insufficient documentation

## 2012-08-06 DIAGNOSIS — M2550 Pain in unspecified joint: Secondary | ICD-10-CM | POA: Insufficient documentation

## 2012-08-06 DIAGNOSIS — R293 Abnormal posture: Secondary | ICD-10-CM | POA: Insufficient documentation

## 2012-08-06 DIAGNOSIS — M2560 Stiffness of unspecified joint, not elsewhere classified: Secondary | ICD-10-CM | POA: Insufficient documentation

## 2012-08-12 ENCOUNTER — Ambulatory Visit: Payer: No Typology Code available for payment source | Admitting: Rehabilitation

## 2012-08-13 ENCOUNTER — Ambulatory Visit: Payer: No Typology Code available for payment source | Admitting: Rehabilitation

## 2012-08-19 ENCOUNTER — Ambulatory Visit: Payer: No Typology Code available for payment source | Admitting: Rehabilitation

## 2012-08-20 ENCOUNTER — Encounter: Payer: No Typology Code available for payment source | Admitting: Rehabilitation

## 2012-08-21 ENCOUNTER — Encounter: Payer: Self-pay | Admitting: Internal Medicine

## 2012-08-22 ENCOUNTER — Ambulatory Visit: Payer: No Typology Code available for payment source | Admitting: Internal Medicine

## 2012-08-26 ENCOUNTER — Ambulatory Visit: Payer: No Typology Code available for payment source | Admitting: Rehabilitation

## 2012-08-27 ENCOUNTER — Ambulatory Visit: Payer: No Typology Code available for payment source | Admitting: Rehabilitation

## 2012-08-27 ENCOUNTER — Other Ambulatory Visit: Payer: Self-pay | Admitting: *Deleted

## 2012-08-27 NOTE — Telephone Encounter (Signed)
done

## 2012-09-03 ENCOUNTER — Encounter (HOSPITAL_COMMUNITY): Payer: Self-pay | Admitting: *Deleted

## 2012-09-03 ENCOUNTER — Emergency Department (HOSPITAL_COMMUNITY)
Admission: EM | Admit: 2012-09-03 | Discharge: 2012-09-03 | Disposition: A | Discharge status: Home / Self Care | Payer: No Typology Code available for payment source | Attending: Emergency Medicine | Admitting: Emergency Medicine

## 2012-09-03 ENCOUNTER — Ambulatory Visit: Payer: No Typology Code available for payment source | Admitting: Rehabilitation

## 2012-09-03 DIAGNOSIS — M76821 Posterior tibial tendinitis, right leg: Secondary | ICD-10-CM

## 2012-09-03 DIAGNOSIS — J45909 Unspecified asthma, uncomplicated: Secondary | ICD-10-CM | POA: Insufficient documentation

## 2012-09-03 DIAGNOSIS — Z8679 Personal history of other diseases of the circulatory system: Secondary | ICD-10-CM | POA: Insufficient documentation

## 2012-09-03 DIAGNOSIS — M76829 Posterior tibial tendinitis, unspecified leg: Secondary | ICD-10-CM | POA: Insufficient documentation

## 2012-09-03 DIAGNOSIS — Z79899 Other long term (current) drug therapy: Secondary | ICD-10-CM | POA: Insufficient documentation

## 2012-09-03 DIAGNOSIS — Z862 Personal history of diseases of the blood and blood-forming organs and certain disorders involving the immune mechanism: Secondary | ICD-10-CM | POA: Insufficient documentation

## 2012-09-03 DIAGNOSIS — Z8719 Personal history of other diseases of the digestive system: Secondary | ICD-10-CM | POA: Insufficient documentation

## 2012-09-03 MED ORDER — HYDROCODONE-ACETAMINOPHEN 5-325 MG PO TABS: 1.0000 | ORAL_TABLET | Freq: Four times a day (QID) | ORAL | Status: DC | PRN

## 2012-09-03 MED ORDER — HYDROCODONE-ACETAMINOPHEN 5-500 MG PO TABS: 1.0000 | ORAL_TABLET | Freq: Four times a day (QID) | ORAL | Status: DC | PRN

## 2012-09-03 NOTE — ED Provider Notes (Signed)
History     CSN: [id number]  Arrival date & time 09/03/12  0807   First MD Initiated Contact with Patient 09/03/12 0809      Chief Complaint  Patient presents with  . Ankle Pain    (Consider location/radiation/quality/duration/timing/severity/associated sxs/prior treatment) HPI [name] is a 32 y.o. female who presents to ED with complaint of right ankle pain. States no injury but has walked for 10hrs straight yesterday, after which time, she began having pain and swelling to the medial right ankle. States has hx of the same. NO fever, chills, redness over the painful area. Tried alcohol topically and soaking it with no improvement. Pt states has flat feet. Walking makes pain worse. Elevating foot makes it better.    Past Medical History  Diagnosis Date  . GERD (gastroesophageal reflux disease)   . Erosive esophagitis     distal esophagus, with low grade bleed 03/2009  . IBS (irritable bowel syndrome)   . Anemia   . History of migraine headaches   . Asthma   . Headache     Past Surgical History  Procedure Laterality Date  . No past surgeries      Family History  Problem Relation Age of Onset  . Heart disease Mother   . Migraines Mother   . Migraines Father   . Asthma Daughter     History  Substance Use Topics  . Smoking status: Never Smoker   . Smokeless tobacco: Never Used  . Alcohol Use: No     Comment: occasional on holidays    OB History   Grav Para Term Preterm Abortions TAB SAB Ect Mult Living                  Review of Systems  Constitutional: Negative for fever and chills.  Musculoskeletal: Positive for joint swelling and arthralgias.  Neurological: Negative for weakness and numbness.    Allergies  Ibuprofen  Home Medications   Current Outpatient Rx  Name  Route  Sig  Dispense  Refill  . acetaminophen-codeine (TYLENOL #3) 300-30 MG per tablet   Oral   Take 1 tablet by mouth 2 (two) times daily as needed (for migraine).         Marland Kitchen albuterol (PROVENTIL HFA;VENTOLIN HFA) 108 (90 BASE) MCG/ACT inhaler   Inhalation   Inhale 2 puffs into the lungs every 6 (six) hours as needed. For shortness of breath   1 Inhaler   0     BP 136/78  Pulse 90  Temp(Src) 99 F (37.2 C) (Oral)  Resp 18  SpO2 100%  Physical Exam  Constitutional: She is oriented to person, place, and time. She appears well-developed and well-nourished. No distress.  Cardiovascular: Normal rate, regular rhythm and normal heart sounds.   Pulmonary/Chest: Effort normal and breath sounds normal. No respiratory distress. She has no wheezes. She has no rales.  Musculoskeletal:  Swelling noted over right medial ankle. Tender to palpation over posterior medial malleolus and over the arch of the right foot. Pt has flat feet. Pain with foot eversion. Full rom of the ankle and foot.   Neurological: She is alert and oriented to person, place, and time.  Skin: Skin is warm and dry.    ED Course  Procedures (including critical care time)  Labs Reviewed - No data to display No results found.   1. Posterior tibial tendonitis, right       MDM  Pt with right foot pain no injuries. Exam most  consistent with posterior tibial tendon tendonitis. Pt is very flat footed. Recommended better shoes with arch supports, orthotics, follow up with podiatrist or orthopedics. noroco at home, allergic to NSAIDs.          Lottie Mussel, PA-C 09/03/12 [phone]

## 2012-09-03 NOTE — ED Notes (Signed)
Pt reports pain and swelling to right ankle, hx of same and unsure of injury.

## 2012-09-04 ENCOUNTER — Telehealth: Payer: Self-pay | Admitting: *Deleted

## 2012-09-04 ENCOUNTER — Encounter: Payer: Self-pay | Admitting: Internal Medicine

## 2012-09-04 NOTE — Telephone Encounter (Signed)
Pt called for a work note. I call ED and had the PA that cared for her write the note.   Faxed to pt's work

## 2012-09-05 ENCOUNTER — Encounter: Payer: No Typology Code available for payment source | Admitting: Rehabilitation

## 2012-09-08 NOTE — ED Provider Notes (Signed)
Medical screening examination/treatment/procedure(s) were performed by non-physician practitioner and as supervising physician I was immediately available for consultation/collaboration.   Kevin E Steinl, MD 09/08/12 0740 

## 2012-09-10 ENCOUNTER — Ambulatory Visit: Payer: No Typology Code available for payment source | Attending: Internal Medicine | Admitting: Rehabilitation

## 2012-09-10 DIAGNOSIS — M2560 Stiffness of unspecified joint, not elsewhere classified: Secondary | ICD-10-CM | POA: Insufficient documentation

## 2012-09-10 DIAGNOSIS — R293 Abnormal posture: Secondary | ICD-10-CM | POA: Insufficient documentation

## 2012-09-10 DIAGNOSIS — IMO0001 Reserved for inherently not codable concepts without codable children: Secondary | ICD-10-CM | POA: Insufficient documentation

## 2012-09-10 DIAGNOSIS — M2550 Pain in unspecified joint: Secondary | ICD-10-CM | POA: Insufficient documentation

## 2012-09-10 DIAGNOSIS — R262 Difficulty in walking, not elsewhere classified: Secondary | ICD-10-CM | POA: Insufficient documentation

## 2012-09-11 ENCOUNTER — Ambulatory Visit: Payer: No Typology Code available for payment source | Admitting: Internal Medicine

## 2012-09-12 ENCOUNTER — Ambulatory Visit: Payer: No Typology Code available for payment source | Admitting: Physical Therapy

## 2012-09-17 ENCOUNTER — Emergency Department (HOSPITAL_COMMUNITY)
Admission: EM | Admit: 2012-09-17 | Discharge: 2012-09-17 | Disposition: A | Discharge status: Home / Self Care | Payer: No Typology Code available for payment source | Attending: Emergency Medicine | Admitting: Emergency Medicine

## 2012-09-17 ENCOUNTER — Encounter (HOSPITAL_COMMUNITY): Payer: Self-pay | Admitting: Adult Health

## 2012-09-17 ENCOUNTER — Emergency Department (HOSPITAL_COMMUNITY): Payer: No Typology Code available for payment source

## 2012-09-17 ENCOUNTER — Encounter: Payer: No Typology Code available for payment source | Admitting: Rehabilitation

## 2012-09-17 DIAGNOSIS — Z8719 Personal history of other diseases of the digestive system: Secondary | ICD-10-CM | POA: Insufficient documentation

## 2012-09-17 DIAGNOSIS — M76811 Anterior tibial syndrome, right leg: Secondary | ICD-10-CM

## 2012-09-17 DIAGNOSIS — Z8679 Personal history of other diseases of the circulatory system: Secondary | ICD-10-CM | POA: Insufficient documentation

## 2012-09-17 DIAGNOSIS — J45909 Unspecified asthma, uncomplicated: Secondary | ICD-10-CM | POA: Insufficient documentation

## 2012-09-17 DIAGNOSIS — Z79899 Other long term (current) drug therapy: Secondary | ICD-10-CM | POA: Insufficient documentation

## 2012-09-17 DIAGNOSIS — M76829 Posterior tibial tendinitis, unspecified leg: Secondary | ICD-10-CM | POA: Insufficient documentation

## 2012-09-17 DIAGNOSIS — Z862 Personal history of diseases of the blood and blood-forming organs and certain disorders involving the immune mechanism: Secondary | ICD-10-CM | POA: Insufficient documentation

## 2012-09-17 MED ORDER — HYDROCODONE-ACETAMINOPHEN 5-325 MG PO TABS
2.0000 | ORAL_TABLET | Freq: Once | ORAL | Status: AC
  Administered 2012-09-17: 2 via ORAL
  Filled 2012-09-17: qty 2

## 2012-09-17 NOTE — ED Notes (Addendum)
Presents with right ankle pain for 2 weeks. Was seen here previously for same, pt states, "they did not x ray. They said my arch dropped, but it hurts so bad and its not getting better" no edema or deforlmity noted. Pain is worse when applying pressure and movement.

## 2012-09-17 NOTE — ED Provider Notes (Signed)
Medical screening examination/treatment/procedure(s) were performed by non-physician practitioner and as supervising physician I was immediately available for consultation/collaboration.  Olivia Mackie, MD 09/17/12 [phone]

## 2012-09-17 NOTE — ED Notes (Signed)
Pt denies any questions upon discharge. 

## 2012-09-17 NOTE — ED Notes (Signed)
Ortho paged for crutches and post op boot.

## 2012-09-17 NOTE — ED Provider Notes (Signed)
History     CSN: [id number]  Arrival date & time 09/17/12  0107   First MD Initiated Contact with Patient 09/17/12 0130      Chief Complaint  Patient presents with  . Ankle Pain   HPI  History provided by the patient and recent medical chart. Patient is a 32 year old female who presents with complaints of right foot and ankle pain. She complains of pain to the anterior medial aspect of her right foot and ankle area. She reports being seen for similar symptoms a few weeks ago but states that they did not take x-rays and she is very concerned of some problems in the foot and ankle. She denies having any recent injury or trauma. She does report standing and walking a lot her job. She was following the previous providers advice of the purchasing of Dr. Margart Sickles foot insert for her arch and using rest and ice but this is not improved significantly. Her pain increased a lot tonight and woke her from sleep. She did not use any treatments for her symptoms at home. Today she denies significant swelling. There is no weakness or numbness in the foot. No redness of skin. No fever, chills or sweats.    Past Medical History  Diagnosis Date  . GERD (gastroesophageal reflux disease)   . Erosive esophagitis     distal esophagus, with low grade bleed 03/2009  . IBS (irritable bowel syndrome)   . Anemia   . History of migraine headaches   . Asthma   . Headache     Past Surgical History  Procedure Laterality Date  . No past surgeries      Family History  Problem Relation Age of Onset  . Heart disease Mother   . Migraines Mother   . Migraines Father   . Asthma Daughter     History  Substance Use Topics  . Smoking status: Never Smoker   . Smokeless tobacco: Never Used  . Alcohol Use: No     Comment: occasional on holidays    OB History   Grav Para Term Preterm Abortions TAB SAB Ect Mult Living                  Review of Systems  Neurological: Negative for weakness and numbness.   All other systems reviewed and are negative.    Allergies  Ibuprofen  Home Medications   Current Outpatient Rx  Name  Route  Sig  Dispense  Refill  . acetaminophen-codeine (TYLENOL #3) 300-30 MG per tablet   Oral   Take 1 tablet by mouth 2 (two) times daily as needed (for migraine).         Marland Kitchen albuterol (PROVENTIL HFA;VENTOLIN HFA) 108 (90 BASE) MCG/ACT inhaler   Inhalation   Inhale 2 puffs into the lungs every 6 (six) hours as needed. For shortness of breath   1 Inhaler   0   . HYDROcodone-acetaminophen (NORCO) 5-325 MG per tablet   Oral   Take 1 tablet by mouth every 6 (six) hours as needed for pain.   15 tablet   0     BP 139/86  Pulse 88  Temp(Src) 97.6 F (36.4 C) (Oral)  Resp 18  SpO2 100%  Physical Exam  Constitutional: She is oriented to person, place, and time. She appears well-developed and well-nourished. No distress.  HENT:  Head: Normocephalic.  Cardiovascular: Normal rate, regular rhythm and normal heart sounds.   Pulmonary/Chest: Effort normal and breath sounds normal. No respiratory  distress. She has no wheezes. She has no rales.  Abdominal: Soft.  Musculoskeletal: She exhibits edema and tenderness.  Very mild swelling over right medial ankle. Tender to palpation over distal anterior tibialis tendon and anterior medial malleolus. Pt has flat feet. Pain with dorsi flexion and inversion. Full rom of the ankle and foot otherwise.   Neurological: She is alert and oriented to person, place, and time.  Skin: Skin is warm and dry.  Psychiatric: She has a normal mood and affect.    ED Course  Procedures  Dg Ankle Complete Right  09/17/2012  *RADIOLOGY REPORT*  Clinical Data: 63-month history of medial right ankle pain.  No known injuries.  RIGHT ANKLE - COMPLETE 3+ VIEW  Comparison: Right ankle x-rays 07/21/2011, 08/25/2009, 06/24/2007.  Findings: No evidence of acute or subacute fracture or dislocation. Ankle mortise intact with well-preserved joint  space.  No intrinsic osseous abnormalities.  No evidence of a significant joint effusion.  IMPRESSION: Normal examination.   Original Report Authenticated By: Hulan Saas, M.D.      1. Anterior tibialis tendinitis, right       MDM  1:50 AM patient seen and evaluated. Patient resting in bed does not appear in any acute distress her significant discomfort.  Patient has had multiple prior evaluations of the right foot and ankle for similar symptoms. Strongly encouraged to followup with her PCP and also given orthopedic specialist referral for evaluation. She has not seemed to have any acute or emergent condition of her foot and ankle.        Angus Seller, PA-C 09/17/12 [phone]

## 2012-09-18 ENCOUNTER — Encounter: Payer: Self-pay | Admitting: Internal Medicine

## 2012-09-18 ENCOUNTER — Ambulatory Visit (INDEPENDENT_AMBULATORY_CARE_PROVIDER_SITE_OTHER): Payer: No Typology Code available for payment source | Admitting: Internal Medicine

## 2012-09-18 VITALS — BP 127/83 | HR 87 | Temp 98.0°F | Ht 65.0 in | Wt 189.4 lb

## 2012-09-18 DIAGNOSIS — M25579 Pain in unspecified ankle and joints of unspecified foot: Secondary | ICD-10-CM | POA: Insufficient documentation

## 2012-09-18 DIAGNOSIS — M76819 Anterior tibial syndrome, unspecified leg: Secondary | ICD-10-CM | POA: Insufficient documentation

## 2012-09-18 DIAGNOSIS — M76821 Posterior tibial tendinitis, right leg: Secondary | ICD-10-CM

## 2012-09-18 DIAGNOSIS — M76829 Posterior tibial tendinitis, unspecified leg: Secondary | ICD-10-CM

## 2012-09-18 NOTE — Assessment & Plan Note (Addendum)
Suspect a posterior tibial tendonopathy as source of symptoms. Pain is localized to the area the tendon. She has no bony tenderness over the medial malleolus or metatarsal joints. No fractures had been found on foot or ankle x-rays. No history of antecedent trauma or injury. No evidence on exam of any inflammatory arthropathy or systemic disease. She does pes planus and loss of arch on both feet. This may be contributing to her symptoms, but she is failed conservative therapy with shoe inserts, ice, and pain meds. -Sports medicine referral made today. She will have an appointment tomorrow morning at 11:15 AM. -Cautioned her on total daily acetaminophen maximum use of 3 g per day. She cannot take NSAIDs due to  history of erosive esophagitis.

## 2012-09-18 NOTE — Progress Notes (Signed)
Patient ID: [name], female   DOB: Nov 25, 1980, 32 y.o.   MRN: [id number]  Subjective:   Patient ID: [name] female   DOB: 04-Sep-1980 32 y.o.   MRN: [id number]  HPI: [place] is a 32 y.o. female with history of GERD, IBS, anemia, migraines presenting to the clinic with R ankle pain. She says that she's had right ankle pain several times a couple of times per year over the past few years. Most recently, she has had  3 weeks of symptoms of  pain over the medial right ankle that makes it painful for her to ambulate . She says that she is standing at her job for up to 10 hours a day at times it was intolerable to walk. She denies any antecedent trauma or injury. She has flat feet and tried shoe inserts with no relief. She's also tried ice and elevation which is not helped. She takes Tylenol #3 which she has for migraines and says that this helps minimally. She went to the emergency department yesterday. An x-ray was performed which was normal. They recommended she see a specialist. She denies any swelling or pain of other joints. She denies any fever, chills, nausea, vomiting, weight loss, fatigue, skin changes.  .    Past Medical History  Diagnosis Date  . GERD (gastroesophageal reflux disease)   . Erosive esophagitis     distal esophagus, with low grade bleed 03/2009  . IBS (irritable bowel syndrome)   . Anemia   . History of migraine headaches   . Asthma   . Headache    Current Outpatient Prescriptions  Medication Sig Dispense Refill  . acetaminophen-codeine (TYLENOL #3) 300-30 MG per tablet Take 1 tablet by mouth 2 (two) times daily as needed (for migraine).      Marland Kitchen albuterol (PROVENTIL HFA;VENTOLIN HFA) 108 (90 BASE) MCG/ACT inhaler Inhale 2 puffs into the lungs every 6 (six) hours as needed. For shortness of breath  1 Inhaler  0  . HYDROcodone-acetaminophen (NORCO) 5-325 MG per tablet Take 1 tablet by mouth every 6 (six) hours as needed for pain.  15 tablet  0    No current facility-administered medications for this visit.   Family History  Problem Relation Age of Onset  . Heart disease Mother   . Migraines Mother   . Migraines Father   . Asthma Daughter    History   Social History  . Marital Status: Legally Separated    Spouse Name: N/A    Number of Children: N/A  . Years of Education: N/A   Social History Main Topics  . Smoking status: Never Smoker   . Smokeless tobacco: Never Used  . Alcohol Use: No     Comment: occasional on holidays  . Drug Use: No  . Sexually Active: Yes -- Female partner(s)    Birth Control/ Protection: Injection   Other Topics Concern  . None   Social History Narrative   [place]ives in [name], with 2 children   Works at [employer], job requires frequent phone calling, works Tuesday - Friday   Review of Systems: 10 pt ROS performed, pertinent positives and negatives noted in HPI Objective:  Physical Exam: Filed Vitals:   09/18/12 0958  BP: 127/83  Pulse: 87  Temp: 98 F (36.7 C)  TempSrc: Oral  Height: 5\' 5"  (1.651 m)  Weight: 189 lb 6.4 oz (85.911 kg)  SpO2: 100%   Vitals reviewed. General: sitting on bed, R foot in boot, NAD  HEENT: PERRL, EOMI, no scleral icterus Cardiac: RRR, no rubs, murmurs or gallops Pulm: clear to auscultation bilaterally, no wheezes, rales, or rhonchi Abd: soft, nontender, nondistended, BS present Neuro: alert and oriented X3, cranial nerves II-XII grossly intact, strength and sensation to light touch equal in bilateral upper and lower extremities Ext: Bilateral pes planus. RLE with TTP of medial ankle at site of posterior tibialis tendon/flexor hallucis longus. No TTP of medial malleolus or metatarsal joints. Minimal edema over L medial ankle. No synovitis or joint effusions appreciated. Normal ROM of ankle with pain elicited on inversion>eversion. Movement of toes does not elicit pain  Assessment & Plan:   Please see problem-based charting for assessment and  plan.

## 2012-09-18 NOTE — Patient Instructions (Addendum)
1. You have an appointment with Sports Medicine tomorrow morning at 11:15 am. Please wear your boot and crutches until then. 2. You can put ice on your ankle to see if it helps with your pain. 3. You can take tylenol for pain, but total daily dose of tylenol should not be more than 3,000mg . Tylenol is also found in Norco and Tylenol#3.

## 2012-09-19 ENCOUNTER — Other Ambulatory Visit (INDEPENDENT_AMBULATORY_CARE_PROVIDER_SITE_OTHER): Payer: No Typology Code available for payment source

## 2012-09-19 ENCOUNTER — Encounter: Payer: Self-pay | Admitting: Sports Medicine

## 2012-09-19 ENCOUNTER — Ambulatory Visit (INDEPENDENT_AMBULATORY_CARE_PROVIDER_SITE_OTHER): Payer: No Typology Code available for payment source | Admitting: Sports Medicine

## 2012-09-19 ENCOUNTER — Encounter: Payer: No Typology Code available for payment source | Admitting: Rehabilitation

## 2012-09-19 VITALS — BP 127/87 | Ht 66.0 in | Wt 189.0 lb

## 2012-09-19 DIAGNOSIS — M25571 Pain in right ankle and joints of right foot: Secondary | ICD-10-CM

## 2012-09-19 DIAGNOSIS — M25579 Pain in unspecified ankle and joints of unspecified foot: Secondary | ICD-10-CM

## 2012-09-19 LAB — BASIC METABOLIC PANEL WITH GFR
BUN: 11 mg/dL (ref 6–23)
Calcium: 8.9 mg/dL (ref 8.4–10.5)
Chloride: 108 mEq/L (ref 96–112)
Creat: 0.91 mg/dL (ref 0.50–1.10)
GFR, Est African American: >89 mL/min
GFR, Est Non African American: 83 mL/min

## 2012-09-19 MED ORDER — TRAMADOL HCL 50 MG PO TABS
50.0000 mg | ORAL_TABLET | Freq: Four times a day (QID) | ORAL | Status: DC | PRN
Start: 2012-09-19 — End: 2012-10-12

## 2012-09-19 MED ORDER — PREDNISONE 10 MG PO KIT: PACK | ORAL | Status: DC

## 2012-09-19 NOTE — Patient Instructions (Addendum)
Thank you for coming in today. Take the prednisone pack.  Take tramadol as needed for pain.  Use the crutches and the boot as needed.  Get the MRI.  I will call about the results.   Scheduled pt for appt for MRI on Wednesday 09/24/12 at 9:45 am at [facility].  Please go to radiology on 1st floor to check in.

## 2012-09-19 NOTE — Assessment & Plan Note (Signed)
Ankle synovitis without significant tendinitis.  This is either OCD rheumatologic disease or gout.  Plan: Discussed options Plan for MRI of the right ankle to evaluate etiology and rule out OCD.  Labs for rheumatologic workup including rheumatoid factor ANA, CRP, ESR, BMP, uric acid Treat symptoms with prednisone dose pack, and tramadol.  Work note provided [employer] followup over the phone after labs and tests

## 2012-09-19 NOTE — Progress Notes (Signed)
[name] is a 32 y.o. female who presents to [facility] today for right ankle pain. Patient is noted medial right ankle pain for approximately one month. She has a history of recurrent medial ankle pain dating back for about 4 years. She denies any injury initially or with the current episode. She notes pain and swelling in the anterior aspect of her medial malleolus. She is significant pain with weightbearing and walking. She is unable to work because of the pain. She was seen by her primary care provider who thought posterior tibialis tendinitis and treated her with a postop shoe and crutches and Vicodin. She notes some resolution in pain swelling but it is still quite debilitating. She denies any fevers or chills.  She denies any personal history or family history of rheumatologic disease or gout   PMH reviewed. Asthma History  Substance Use Topics  . Smoking status: Never Smoker   . Smokeless tobacco: Never Used  . Alcohol Use: No     Comment: occasional on holidays   ROS as above otherwise neg   Exam:  BP 127/87  Ht 5\' 6"  (1.676 m)  Wt 189 lb (85.73 kg)  BMI 30.52 kg/m2 Gen: Well NAD MSK: Right ankle: Swollen mild to moderate synovitis on the anterior aspect of the medial malleolus.  Significantly tender to palpation in the anterior aspect of the medial malleolus mild to moderately tender over the posterior tibialis course.  Nontender laterally.  Decreased ankle motion to dorsiflexion and inversion passively and actively.  Some active motion with posterior tibialis firing is present. Pulses capillary refill and sensation are intact distally.   Limited musculoskeletal ultrasound of the right ankle.  Posterior tibialis tendon at the level of the medial malleolus is normal appearing and intact as are flexor digitorum and flexor Hallucis tendons. Posterior tibialis tendon is normal-appearing without significant tenosynovitis at the level of navicular.  Area of swelling and  tenderness located with ultrasound.  Moderate synovitis without significant effusion is present. No significant bony changes overlying area of tenderness.   Dg Ankle Complete Right  09/17/2012  *RADIOLOGY REPORT*  Clinical Data: 63-month history of medial right ankle pain.  No known injuries.  RIGHT ANKLE - COMPLETE 3+ VIEW  Comparison: Right ankle x-rays 07/21/2011, 08/25/2009, 06/24/2007.  Findings: No evidence of acute or subacute fracture or dislocation. Ankle mortise intact with well-preserved joint space.  No intrinsic osseous abnormalities.  No evidence of a significant joint effusion.  IMPRESSION: Normal examination.   Original Report Authenticated By: Hulan Saas, M.D.

## 2012-09-20 LAB — C-REACTIVE PROTEIN: CRP: <0.5 mg/dL (ref <0.60)

## 2012-09-24 ENCOUNTER — Ambulatory Visit (HOSPITAL_COMMUNITY)
Admission: RE | Admit: 2012-09-24 | Discharge: 2012-09-24 | Disposition: A | Discharge status: Home / Self Care | Payer: No Typology Code available for payment source | Source: Ambulatory Visit | Attending: Family Medicine | Admitting: Family Medicine

## 2012-09-24 ENCOUNTER — Encounter: Payer: No Typology Code available for payment source | Admitting: Rehabilitation

## 2012-09-24 DIAGNOSIS — M25571 Pain in right ankle and joints of right foot: Secondary | ICD-10-CM

## 2012-09-24 DIAGNOSIS — M25579 Pain in unspecified ankle and joints of unspecified foot: Secondary | ICD-10-CM | POA: Insufficient documentation

## 2012-09-24 DIAGNOSIS — Q742 Other congenital malformations of lower limb(s), including pelvic girdle: Secondary | ICD-10-CM | POA: Insufficient documentation

## 2012-09-24 DIAGNOSIS — M79609 Pain in unspecified limb: Secondary | ICD-10-CM | POA: Insufficient documentation

## 2012-09-24 DIAGNOSIS — M25473 Effusion, unspecified ankle: Secondary | ICD-10-CM | POA: Insufficient documentation

## 2012-09-24 DIAGNOSIS — M25476 Effusion, unspecified foot: Secondary | ICD-10-CM | POA: Insufficient documentation

## 2012-09-26 ENCOUNTER — Encounter: Payer: No Typology Code available for payment source | Admitting: Rehabilitation

## 2012-09-29 ENCOUNTER — Other Ambulatory Visit: Payer: Self-pay | Admitting: *Deleted

## 2012-09-29 DIAGNOSIS — R0602 Shortness of breath: Secondary | ICD-10-CM

## 2012-09-29 NOTE — Telephone Encounter (Signed)
[facility] called with concern of albuterol refill. Pt has used 3 inhalers between 04/11/12 and 07/07/12 And has used 3 inhalers between 07/07/12 and 09/29/12 She is out and request refill.  She states she lost one but it looks like we gave a sample on 05/08/12. There concern is to much use of rescue inhaler. Do you want to see pt to evaluate, start another inhaler or refill. Usually you get # 200 puffs from one inhaler.

## 2012-09-30 ENCOUNTER — Telehealth: Payer: Self-pay | Admitting: *Deleted

## 2012-09-30 NOTE — Telephone Encounter (Signed)
Please have patient come in for follow up appointment to be evaluated. Thanks! SK

## 2012-09-30 NOTE — Telephone Encounter (Signed)
Tried to return call for [name] at [phone] - the female told me to stop calling this number and hung up. Tried calling work [phone] and unable to talk with pt per work rules. Stanton Kidney Shakthi Scipio RN 09/30/12 2:20PM

## 2012-10-01 NOTE — Addendum Note (Signed)
Addended by: Neomia Dear on: 10/01/2012 06:38 PM   Modules accepted: Orders

## 2012-10-02 ENCOUNTER — Encounter: Payer: Self-pay | Admitting: Family Medicine

## 2012-10-02 NOTE — Telephone Encounter (Signed)
Tried calling pt and incorrect phone #.  Will ask front desk to get in touch with pt for OV.  May need to send letter. I have not heard from pt just pharmacy.

## 2012-10-09 ENCOUNTER — Telehealth: Payer: Self-pay | Admitting: Internal Medicine

## 2012-10-09 NOTE — Telephone Encounter (Signed)
Rec'd faxed confirmation from [facility] that no records were found for this patient for ref date 05/08/2012.

## 2012-10-12 ENCOUNTER — Emergency Department (HOSPITAL_COMMUNITY)
Admission: EM | Admit: 2012-10-12 | Discharge: 2012-10-12 | Disposition: A | Discharge status: Home / Self Care | Payer: No Typology Code available for payment source | Attending: Emergency Medicine | Admitting: Emergency Medicine

## 2012-10-12 ENCOUNTER — Encounter (HOSPITAL_COMMUNITY): Payer: Self-pay | Admitting: Family Medicine

## 2012-10-12 ENCOUNTER — Emergency Department (HOSPITAL_COMMUNITY): Payer: No Typology Code available for payment source

## 2012-10-12 DIAGNOSIS — Z79899 Other long term (current) drug therapy: Secondary | ICD-10-CM | POA: Insufficient documentation

## 2012-10-12 DIAGNOSIS — J45909 Unspecified asthma, uncomplicated: Secondary | ICD-10-CM | POA: Insufficient documentation

## 2012-10-12 DIAGNOSIS — Z862 Personal history of diseases of the blood and blood-forming organs and certain disorders involving the immune mechanism: Secondary | ICD-10-CM | POA: Insufficient documentation

## 2012-10-12 DIAGNOSIS — Z8719 Personal history of other diseases of the digestive system: Secondary | ICD-10-CM | POA: Insufficient documentation

## 2012-10-12 DIAGNOSIS — Z8679 Personal history of other diseases of the circulatory system: Secondary | ICD-10-CM | POA: Insufficient documentation

## 2012-10-12 DIAGNOSIS — M79609 Pain in unspecified limb: Secondary | ICD-10-CM | POA: Insufficient documentation

## 2012-10-12 DIAGNOSIS — M79644 Pain in right finger(s): Secondary | ICD-10-CM

## 2012-10-12 MED ORDER — ACETAMINOPHEN 325 MG PO TABS
650.0000 mg | ORAL_TABLET | Freq: Once | ORAL | Status: AC
  Administered 2012-10-12: 650 mg via ORAL
  Filled 2012-10-12: qty 2

## 2012-10-12 NOTE — ED Provider Notes (Signed)
History     CSN: [id number]  Arrival date & time 10/12/12  0714   First MD Initiated Contact with Patient 10/12/12 [phone]      Chief Complaint  Patient presents with  . Hand Pain    (Consider location/radiation/quality/duration/timing/severity/associated sxs/prior treatment) HPI Comments: 32 y/o female presents to the ED complaining of right ring finger pain x 1 day that she noticed upon waking this morning. Cannot recall any injury, however states she "may have slept on it wrong". Also recalls lifting a jar a couple weeks back and "something not feeling right" but has not had any issue since. Describes the pain as constant, throbbing, non-radiating, rated 8/10, worse with straightening out her finger. She has not tried any alleviating factors. Denies numbness or tingling.  Patient is a 32 y.o. female presenting with hand pain. The history is provided by the patient.  Hand Pain Pertinent negatives include no chills, fever or numbness.    Past Medical History  Diagnosis Date  . GERD (gastroesophageal reflux disease)   . Erosive esophagitis     distal esophagus, with low grade bleed 03/2009  . IBS (irritable bowel syndrome)   . Anemia   . History of migraine headaches   . Asthma   . Headache     Past Surgical History  Procedure Laterality Date  . No past surgeries      Family History  Problem Relation Age of Onset  . Heart disease Mother   . Migraines Mother   . Migraines Father   . Asthma Daughter     History  Substance Use Topics  . Smoking status: Never Smoker   . Smokeless tobacco: Never Used  . Alcohol Use: No     Comment: occasional on holidays    OB History   Grav Para Term Preterm Abortions TAB SAB Ect Mult Living                  Review of Systems  Constitutional: Negative for fever and chills.  Musculoskeletal:       Positive for right ring finger pain.  Neurological: Negative for numbness.  All other systems reviewed and are  negative.    Allergies  Ibuprofen  Home Medications   Current Outpatient Rx  Name  Route  Sig  Dispense  Refill  . albuterol (PROVENTIL HFA;VENTOLIN HFA) 108 (90 BASE) MCG/ACT inhaler   Inhalation   Inhale 2 puffs into the lungs every 6 (six) hours as needed. For shortness of breath   1 Inhaler   0     BP 143/87  Pulse 79  Temp(Src) 98 F (36.7 C) (Oral)  Resp 16  SpO2 100%  Physical Exam  Nursing note and vitals reviewed. Constitutional: She is oriented to person, place, and time. She appears well-developed and well-nourished. No distress.  HENT:  Head: Normocephalic and atraumatic.  Eyes: Conjunctivae are normal.  Neck: Normal range of motion. Neck supple.  Cardiovascular: Normal rate, regular rhythm, normal heart sounds and intact distal pulses.   Pulmonary/Chest: Effort normal and breath sounds normal.  Musculoskeletal: She exhibits no edema.  TTP of dorsal aspect of proximal ring finger between MCP and PIP. Full passive ROM. Refuses to perform active ROM. No overlying edema, erythema or deformity. Capillary refill < 3 seconds.   Neurological: She is alert and oriented to person, place, and time. No sensory deficit.  Sensation intact.  Skin: Skin is warm and dry. No erythema.  Psychiatric: She has a normal mood and affect.  Her behavior is normal.    ED Course  Procedures (including critical care time)  Labs Reviewed - No data to display Dg Finger Ring Right  10/12/2012  *RADIOLOGY REPORT*  Clinical Data: Hand pain  RIGHT RING FINGER 2+V  Comparison: None.  Findings: No fracture or dislocation is seen.  The joint spaces are preserved.  The visualized soft tissues are unremarkable.  IMPRESSION: No fracture or dislocation is seen.   Original Report Authenticated By: Charline Bills, M.D.      1. Finger pain, right       MDM  32 y/o female with finger pain. No erythema, edema, deformity. Full passive ROM. refuses to perform active. Xray negative for any  acute abnormality. Advised tylenol for pain along with ice and elevation. She will f/u with her PCP if no improvement in 1 week. Patient recently had auto-immune workup with normal ANA, RA, ESR, CRP after having her ankle evaluated.        Trevor Mace, PA-C 10/12/12 [phone]

## 2012-10-12 NOTE — ED Provider Notes (Signed)
Medical screening examination/treatment/procedure(s) were performed by non-physician practitioner and as supervising physician I was immediately available for consultation/collaboration.   Gavin Pound. Oletta Lamas, MD 10/12/12 [phone]

## 2012-10-12 NOTE — ED Notes (Signed)
Patient transported to X-ray 

## 2012-10-12 NOTE — ED Notes (Signed)
Per pt sts she woke up with finger pain. Pain in her ring finger on her right had. No injury ar deformity noted. sts hurts to move.

## 2012-10-15 ENCOUNTER — Other Ambulatory Visit: Payer: Self-pay | Admitting: *Deleted

## 2012-10-15 MED ORDER — ACETAMINOPHEN-CODEINE #3 300-30 MG PO TABS: 1.0000 | ORAL_TABLET | Freq: Two times a day (BID) | ORAL | Status: DC | PRN

## 2012-10-15 NOTE — Telephone Encounter (Signed)
Called to pharm 

## 2012-10-16 ENCOUNTER — Ambulatory Visit (INDEPENDENT_AMBULATORY_CARE_PROVIDER_SITE_OTHER): Payer: No Typology Code available for payment source | Admitting: *Deleted

## 2012-10-16 DIAGNOSIS — Z309 Encounter for contraceptive management, unspecified: Secondary | ICD-10-CM

## 2012-10-16 MED ORDER — MEDROXYPROGESTERONE ACETATE 150 MG/ML IM SUSP
150.0000 mg | Freq: Once | INTRAMUSCULAR | Status: AC
  Administered 2012-10-16: 150 mg via INTRAMUSCULAR

## 2012-10-16 NOTE — Progress Notes (Signed)
Patient ID: [name], female   DOB: 06/10/1981, 32 y.o.   MRN: [id number] Next injection due July 9 - reminder card given to pt.

## 2012-10-20 ENCOUNTER — Other Ambulatory Visit: Payer: Self-pay | Admitting: *Deleted

## 2012-10-20 ENCOUNTER — Encounter: Payer: Self-pay | Admitting: *Deleted

## 2012-10-20 DIAGNOSIS — R0602 Shortness of breath: Secondary | ICD-10-CM

## 2012-10-20 MED ORDER — ALBUTEROL SULFATE HFA 108 (90 BASE) MCG/ACT IN AERS: 2.0000 | INHALATION_SPRAY | Freq: Four times a day (QID) | RESPIRATORY_TRACT | Status: DC | PRN

## 2012-10-21 NOTE — Telephone Encounter (Signed)
Rx called in to pharmacy. 

## 2012-10-23 ENCOUNTER — Ambulatory Visit: Payer: No Typology Code available for payment source | Admitting: Internal Medicine

## 2012-10-29 ENCOUNTER — Encounter: Payer: Self-pay | Admitting: Sports Medicine

## 2012-10-29 ENCOUNTER — Ambulatory Visit (INDEPENDENT_AMBULATORY_CARE_PROVIDER_SITE_OTHER): Payer: No Typology Code available for payment source | Admitting: Sports Medicine

## 2012-10-29 VITALS — BP 132/87 | HR 90 | Ht 66.0 in | Wt 189.0 lb

## 2012-10-29 DIAGNOSIS — M76829 Posterior tibial tendinitis, unspecified leg: Secondary | ICD-10-CM

## 2012-10-29 DIAGNOSIS — M6789 Other specified disorders of synovium and tendon, multiple sites: Secondary | ICD-10-CM

## 2012-10-29 NOTE — Progress Notes (Signed)
  Subjective:    Patient ID: [name], female    DOB: 14-Apr-1981, 32 y.o.   MRN: [id number]  HPI Patient comes in today for followup on her right ankle pain. Her pain has improved since her last visit. She is now out of her Cam Walker. MRI was ordered and results are below. Patient's pain is currently tolerable. She tells me that she's had a lifelong history of being "flat-footed" but that her ankle pain is fairly new. She localizes all of her pain to the medial aspect of the ankle. Denies any lateral ankle pain. She is unable to take NSAIDs do to a history of an esophageal ulcer. Recent blood work including a rheumatological panel were unremarkable in regards to obvious inflammatory arthropathies.    Review of Systems     Objective:   Physical Exam Well-developed, well-nourished. No acute distress. Awake alert and oriented x3.  Right ankle: Full range of motion. No effusion. No soft tissue swelling. Patient has markedly pes planus with standing and pronation with walking. Calcaneal eversion bilaterally. Pain with standing on her tiptoes and a lack of calcaneal inversion on the right. There is tenderness to palpation along both the medial malleolus as well as along the course of the posterior tibialis tendon. There is reproducible pain with resisted posterior tibialis tendon contraction. Flexible subtalar joint. There is no tenderness to palpation along the lateral ankle. She is neurovascularly intact distally and walking without significant limp.  MRI of her right ankle dated 09/24/2012 is reviewed. 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 She has associated marrow edema within the lateral malleolus and chronic subluxation of the peroneal tendons in part due to a shallow retromalleolar groove. The posterior tibialis tendon is intact. There is some dorsal talar beaking. No OCD.        Assessment & Plan:  1. 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 Reflux esophagitis  This is a chronic problem. Although the MRI shows evidence of lateral impingement, clinically the patient's symptoms are along the posterior tibialis tendon. I've given her some green sports insoles with a scaphoid pad to try. I've also given her an arch strap to try with activity, particularly at work or she stands for long periods of time. She will start eccentric exercises for her posterior tibialis tendon with a theraband. She still has her Cam Walker at home and I've instructed her to resume using it prn if she experiences increasing pain or swelling. Patient is unable to take NSAIDs so pain will need to be managed either with Tylenol or with Ultram. She is not requiring either one of those at this time. Followup in 4 weeks.

## 2012-11-06 ENCOUNTER — Encounter: Payer: Self-pay | Admitting: Internal Medicine

## 2012-11-06 ENCOUNTER — Ambulatory Visit (INDEPENDENT_AMBULATORY_CARE_PROVIDER_SITE_OTHER): Payer: No Typology Code available for payment source | Admitting: Internal Medicine

## 2012-11-06 ENCOUNTER — Telehealth: Payer: Self-pay | Admitting: *Deleted

## 2012-11-06 VITALS — BP 114/79 | HR 68 | Temp 98.5°F | Ht 65.0 in | Wt 187.9 lb

## 2012-11-06 DIAGNOSIS — R42 Dizziness and giddiness: Secondary | ICD-10-CM | POA: Insufficient documentation

## 2012-11-06 DIAGNOSIS — R0602 Shortness of breath: Secondary | ICD-10-CM

## 2012-11-06 DIAGNOSIS — J45909 Unspecified asthma, uncomplicated: Secondary | ICD-10-CM

## 2012-11-06 MED ORDER — MECLIZINE HCL 25 MG PO TABS: 25.0000 mg | ORAL_TABLET | Freq: Three times a day (TID) | ORAL | Status: DC | PRN

## 2012-11-06 MED ORDER — ALBUTEROL SULFATE HFA 108 (90 BASE) MCG/ACT IN AERS: 2.0000 | INHALATION_SPRAY | Freq: Four times a day (QID) | RESPIRATORY_TRACT | Status: DC | PRN

## 2012-11-06 NOTE — Assessment & Plan Note (Addendum)
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 BPPV versus  vestibular neuritis. Recent dental infection disposition to later. Highly unlikely to have CVA.  - No testing today needed. - Symptomatically with meclizine 25 mg 3 times a day as needed. - Return office visit in 3-4 days. - Will consider treating neuritis with prednisone if this does not improve with meclizine.  - More than 35 minutes spent face-to-face.

## 2012-11-06 NOTE — Assessment & Plan Note (Signed)
-   Refilled albuterol inhaler 

## 2012-11-06 NOTE — Telephone Encounter (Signed)
Agree with appointment today to be seen.

## 2012-11-06 NOTE — Patient Instructions (Signed)
Please make a followup appointment in 3-4 days if symptoms don't get better.  Take meclizine 25 mg 3 times a day as needed for dizziness.  Continue taking other medications regularly.  If your symptoms get worse, give [place] a call for an appointment or go to the ED.

## 2012-11-06 NOTE — Telephone Encounter (Signed)
Pt calls stating when she awoke this am she felt "bad", "swimmy headed". She rested and then went into work. It didn't completely clear and in fact she has become worse, now she is very dizzy, hot, weak, nauseous, she was assisted to the office where she is resting she calls for an appt/ advice. States she did see dentist Friday and was given abx, felt fine til this am. This has happened one time before when she" had a hole in her esophagus" she is scheduled with dr patel [phone]

## 2012-11-06 NOTE — Progress Notes (Signed)
  Subjective:    Patient ID: [name], female    DOB: 12-Aug-1980, 32 y.o.   MRN: [id number]  HPI patient is a pleasant 32 year old woman with migraine, dental caries, chronic foot pain, asthma and other problems as per problem list who comes the clinic for acute onset dizziness this morning.  Patient woke up this morning and felt dizzy. She had sensation of everything spinning around her. She still feels dizzy since this morning. The dizziness gets worse with lying down or bending forward. Denies any other associated symptoms including tinnitus, earache, ear discharge, headache, nausea vomiting, vision changes.  She describes a sense of disequilibrium and fear of falling when she walks. Although does not have any change in gait.  Denies any sore throat. Also she did have dental pain and possible dental infection for which she started on amoxicillin last Friday by her dentist. Although the pain was in lower jaw.  She felt like this once before when she had esophagitis and esophageal bleeding.  She denies any new changes in dietary habits.. She is not menstruating now.    Review of Systems    as per history of present illness. Objective:   Physical Exam General: NAD HEENT: PERRL, EOMI, no scleral icterus. No nystagmus. Cardiac: S1, S2, RRR, no rubs, murmurs or gallops Pulm: clear to auscultation bilaterally, moving normal volumes of air Abd: soft, nontender, nondistended, BS present Ext: warm and well perfused, no pedal edema Neuro: alert and oriented X3, cranial nerves II-XII grossly intact Dix-Hallpike maneuver: Negative for nystagmus. Although  patient felt dizzy       Assessment & Plan:

## 2012-11-06 NOTE — Progress Notes (Signed)
I discussed this case with Dr. Patel soon after the patient visit. I have read the documentation and I agree with the plan of care. Please see the resident note for details of management.  

## 2012-11-13 ENCOUNTER — Ambulatory Visit: Payer: No Typology Code available for payment source | Admitting: Internal Medicine

## 2012-11-26 ENCOUNTER — Other Ambulatory Visit: Payer: Self-pay | Admitting: *Deleted

## 2012-11-26 DIAGNOSIS — R51 Headache: Secondary | ICD-10-CM

## 2012-11-26 MED ORDER — ACETAMINOPHEN-CODEINE #3 300-30 MG PO TABS: 1.0000 | ORAL_TABLET | Freq: Two times a day (BID) | ORAL | Status: DC | PRN

## 2012-11-26 NOTE — Telephone Encounter (Signed)
Called to pharm 

## 2012-11-26 NOTE — Telephone Encounter (Signed)
Hi Helen,    I approved the Rx request for phone in with one refill. Let me know if you need a print out.   Thanks!  SK

## 2012-12-03 ENCOUNTER — Ambulatory Visit: Payer: No Typology Code available for payment source

## 2012-12-03 ENCOUNTER — Ambulatory Visit (INDEPENDENT_AMBULATORY_CARE_PROVIDER_SITE_OTHER): Payer: No Typology Code available for payment source | Admitting: Sports Medicine

## 2012-12-03 VITALS — BP 136/85 | Ht 66.0 in | Wt 189.0 lb

## 2012-12-03 DIAGNOSIS — M76829 Posterior tibial tendinitis, unspecified leg: Secondary | ICD-10-CM

## 2012-12-03 DIAGNOSIS — M25571 Pain in right ankle and joints of right foot: Secondary | ICD-10-CM

## 2012-12-03 DIAGNOSIS — M25579 Pain in unspecified ankle and joints of unspecified foot: Secondary | ICD-10-CM

## 2012-12-03 DIAGNOSIS — M6789 Other specified disorders of synovium and tendon, multiple sites: Secondary | ICD-10-CM

## 2012-12-03 MED ORDER — TRAMADOL HCL 50 MG PO TABS: 50.0000 mg | ORAL_TABLET | Freq: Four times a day (QID) | ORAL | Status: DC | PRN
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
## 2012-12-03 NOTE — Patient Instructions (Addendum)
Follow up in 1 month.  Take the tramadol as needed for pain.

## 2012-12-27 NOTE — Assessment & Plan Note (Signed)
She appears to have acute rhino sinusitis.  This is likely a viral process and there is no indication for antibiotic therapy at this time.  We discussed symptomatic treatment including NSAIDs with her protonix, mucinex D, and saline nasal rinses.  She was advised to return if this did not improve over the next week.

## 2013-01-01 ENCOUNTER — Ambulatory Visit: Payer: No Typology Code available for payment source | Admitting: Sports Medicine

## 2013-01-10 ENCOUNTER — Emergency Department (HOSPITAL_COMMUNITY)
Admission: EM | Admit: 2013-01-10 | Discharge: 2013-01-10 | Disposition: A | Discharge status: Home / Self Care | Payer: No Typology Code available for payment source | Attending: Emergency Medicine | Admitting: Emergency Medicine

## 2013-01-10 ENCOUNTER — Emergency Department (HOSPITAL_COMMUNITY): Payer: No Typology Code available for payment source

## 2013-01-10 ENCOUNTER — Encounter (HOSPITAL_COMMUNITY): Payer: Self-pay | Admitting: *Deleted

## 2013-01-10 DIAGNOSIS — R51 Headache: Secondary | ICD-10-CM | POA: Insufficient documentation

## 2013-01-10 DIAGNOSIS — J45909 Unspecified asthma, uncomplicated: Secondary | ICD-10-CM | POA: Insufficient documentation

## 2013-01-10 DIAGNOSIS — IMO0002 Reserved for concepts with insufficient information to code with codable children: Secondary | ICD-10-CM | POA: Insufficient documentation

## 2013-01-10 DIAGNOSIS — Z79899 Other long term (current) drug therapy: Secondary | ICD-10-CM | POA: Insufficient documentation

## 2013-01-10 DIAGNOSIS — Z23 Encounter for immunization: Secondary | ICD-10-CM | POA: Insufficient documentation

## 2013-01-10 DIAGNOSIS — W503XXA Accidental bite by another person, initial encounter: Secondary | ICD-10-CM

## 2013-01-10 DIAGNOSIS — Z8719 Personal history of other diseases of the digestive system: Secondary | ICD-10-CM | POA: Insufficient documentation

## 2013-01-10 DIAGNOSIS — Z862 Personal history of diseases of the blood and blood-forming organs and certain disorders involving the immune mechanism: Secondary | ICD-10-CM | POA: Insufficient documentation

## 2013-01-10 DIAGNOSIS — Z8679 Personal history of other diseases of the circulatory system: Secondary | ICD-10-CM | POA: Insufficient documentation

## 2013-01-10 MED ORDER — HYDROCODONE-ACETAMINOPHEN 5-325 MG PO TABS: 2.0000 | ORAL_TABLET | ORAL | Status: DC | PRN

## 2013-01-10 MED ORDER — AMOXICILLIN-POT CLAVULANATE 875-125 MG PO TABS: 1.0000 | ORAL_TABLET | Freq: Two times a day (BID) | ORAL | Status: DC

## 2013-01-10 MED ORDER — AMOXICILLIN-POT CLAVULANATE 875-125 MG PO TABS
1.0000 | ORAL_TABLET | Freq: Once | ORAL | Status: AC
  Administered 2013-01-10: 1 via ORAL
  Filled 2013-01-10: qty 1

## 2013-01-10 MED ORDER — TETANUS-DIPHTH-ACELL PERTUSSIS 5-2.5-18.5 LF-MCG/0.5 IM SUSP
0.5000 mL | Freq: Once | INTRAMUSCULAR | Status: AC
  Administered 2013-01-10: 0.5 mL via INTRAMUSCULAR
  Filled 2013-01-10: qty 0.5

## 2013-01-10 MED ORDER — HYDROCODONE-ACETAMINOPHEN 5-325 MG PO TABS
1.0000 | ORAL_TABLET | Freq: Once | ORAL | Status: AC
  Administered 2013-01-10: 1 via ORAL
  Filled 2013-01-10: qty 1
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
## 2013-01-10 NOTE — ED Provider Notes (Signed)
Medical screening examination/treatment/procedure(s) were performed by non-physician practitioner and as supervising physician I was immediately available for consultation/collaboration.   Maevyn Riordan H Ashaun Gaughan, MD 01/10/13 2354 

## 2013-01-10 NOTE — ED Notes (Signed)
Ice pack given
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
## 2013-01-12 ENCOUNTER — Ambulatory Visit (INDEPENDENT_AMBULATORY_CARE_PROVIDER_SITE_OTHER): Payer: No Typology Code available for payment source | Admitting: Internal Medicine

## 2013-01-12 ENCOUNTER — Encounter: Payer: Self-pay | Admitting: Internal Medicine

## 2013-01-12 VITALS — BP 132/88 | HR 84 | Temp 98.3°F | Wt 181.8 lb

## 2013-01-12 DIAGNOSIS — G43909 Migraine, unspecified, not intractable, without status migrainosus: Secondary | ICD-10-CM

## 2013-01-12 DIAGNOSIS — Z309 Encounter for contraceptive management, unspecified: Secondary | ICD-10-CM

## 2013-01-12 DIAGNOSIS — IMO0001 Reserved for inherently not codable concepts without codable children: Secondary | ICD-10-CM

## 2013-01-12 DIAGNOSIS — Z0000 Encounter for general adult medical examination without abnormal findings: Secondary | ICD-10-CM

## 2013-01-12 DIAGNOSIS — J309 Allergic rhinitis, unspecified: Secondary | ICD-10-CM

## 2013-01-12 DIAGNOSIS — W503XXA Accidental bite by another person, initial encounter: Secondary | ICD-10-CM

## 2013-01-12 DIAGNOSIS — T148XXA Other injury of unspecified body region, initial encounter: Secondary | ICD-10-CM

## 2013-01-12 DIAGNOSIS — J302 Other seasonal allergic rhinitis: Secondary | ICD-10-CM

## 2013-01-12 DIAGNOSIS — J45909 Unspecified asthma, uncomplicated: Secondary | ICD-10-CM

## 2013-01-12 MED ORDER — MOMETASONE FUROATE 50 MCG/ACT NA SUSP: 2.0000 | Freq: Every day | NASAL | Status: DC

## 2013-01-12 MED ORDER — MEDROXYPROGESTERONE ACETATE 150 MG/ML IM SUSP
150.0000 mg | Freq: Once | INTRAMUSCULAR | Status: AC
  Administered 2013-01-12: 150 mg via INTRAMUSCULAR

## 2013-01-12 MED ORDER — ACETAMINOPHEN-CODEINE #3 300-30 MG PO TABS: 1.0000 | ORAL_TABLET | Freq: Four times a day (QID) | ORAL | Status: DC | PRN

## 2013-01-12 NOTE — Patient Instructions (Addendum)
-  Keep a migraine diary to discover possible triggers of this problem.  -Use Nasonex daily for allergies -Schedule an appointment for your Pap Smear.  -If your facial wound becomes red, swollen, or has bloody discharge from it, or if you develop fever/chills,  call us [phone]) or go to the Emergency Department.  -Continue taking your antibiotics until you finish them all.

## 2013-01-13 DIAGNOSIS — Z0000 Encounter for general adult medical examination without abnormal findings: Secondary | ICD-10-CM | POA: Insufficient documentation

## 2013-01-13 DIAGNOSIS — W503XXA Accidental bite by another person, initial encounter: Secondary | ICD-10-CM | POA: Insufficient documentation

## 2013-01-13 DIAGNOSIS — J302 Other seasonal allergic rhinitis: Secondary | ICD-10-CM | POA: Insufficient documentation
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
## 2013-01-13 NOTE — Assessment & Plan Note (Signed)
She reports asthma as a child but no recent PFTs. She has occasional SOB with most recent last week.  -Albuterol inhaler PRN
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
## 2013-01-13 NOTE — Assessment & Plan Note (Signed)
She reports itchy dry eyes with post-nasal drip, which improved while she was on Nasonex treatment. Her allergies seem to trigger her headaches.  -Prescribed Nasonex nasal spray, pt to call us if this medicine is too costly, in which case she was advised to try Claritin or Allegra OTC.

## 2013-01-13 NOTE — Assessment & Plan Note (Signed)
Depo Provera injection given during this visit.

## 2013-01-13 NOTE — Assessment & Plan Note (Signed)
She has lost her headache journal but reports that seasonal allergies seem to trigger her migraine. She has tried Imtrex but this has not improved her headache and she has needed Tylenol #3 with good results. Her migraine headaches occur at least once per week but she does not take Tylenol #3 everyday.  -Encouraged her to use Nasonex for her seasonal allergies--if this medicine is not available at the [facility] or [facility] at an affordable price she was encouraged to let us know of to try Claritin or Allegra OTC.  -She will restart a headache diary.  -Prescribed Tylenol 3, #30

## 2013-01-13 NOTE — Assessment & Plan Note (Signed)
She is due for a Pap smear. She will make an appointment for next month for a Pap smear.

## 2013-01-14 NOTE — Progress Notes (Signed)
Case discussed with Dr. Kennerly soon after the resident saw the patient.  We reviewed the resident's history and exam and pertinent patient test results.  I agree with the assessment, diagnosis, and plan of care documented in the resident's note. 

## 2013-01-22 ENCOUNTER — Ambulatory Visit: Payer: No Typology Code available for payment source | Admitting: Sports Medicine

## 2013-01-29 ENCOUNTER — Encounter (HOSPITAL_COMMUNITY): Payer: Self-pay | Admitting: Emergency Medicine

## 2013-01-29 ENCOUNTER — Emergency Department (INDEPENDENT_AMBULATORY_CARE_PROVIDER_SITE_OTHER)
Admission: EM | Admit: 2013-01-29 | Discharge: 2013-01-29 | Disposition: A | Discharge status: Home / Self Care | Payer: Self-pay | Source: Home / Self Care | Attending: Family Medicine | Admitting: Family Medicine

## 2013-01-29 ENCOUNTER — Emergency Department (INDEPENDENT_AMBULATORY_CARE_PROVIDER_SITE_OTHER): Payer: No Typology Code available for payment source

## 2013-01-29 DIAGNOSIS — S139XXA Sprain of joints and ligaments of unspecified parts of neck, initial encounter: Secondary | ICD-10-CM

## 2013-01-29 DIAGNOSIS — S63599A Other specified sprain of unspecified wrist, initial encounter: Secondary | ICD-10-CM

## 2013-01-29 MED ORDER — HYDROCODONE-ACETAMINOPHEN 5-325 MG PO TABS
ORAL_TABLET | ORAL | Status: AC
  Filled 2013-01-29: qty 1

## 2013-01-29 MED ORDER — HYDROCODONE-ACETAMINOPHEN 5-325 MG PO TABS
1.0000 | ORAL_TABLET | Freq: Once | ORAL | Status: AC
  Administered 2013-01-29: 1 via ORAL

## 2013-01-29 MED ORDER — HYDROCODONE-ACETAMINOPHEN 5-325 MG PO TABS: 2.0000 | ORAL_TABLET | ORAL | Status: DC | PRN

## 2013-01-29 MED ORDER — TRAMADOL HCL 50 MG PO TABS: 50.0000 mg | ORAL_TABLET | Freq: Four times a day (QID) | ORAL | Status: DC | PRN

## 2013-01-29 MED ORDER — CYCLOBENZAPRINE HCL 10 MG PO TABS: 10.0000 mg | ORAL_TABLET | Freq: Two times a day (BID) | ORAL | Status: DC | PRN
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
## 2013-02-02 ENCOUNTER — Encounter: Payer: Self-pay | Admitting: Internal Medicine

## 2013-02-02 ENCOUNTER — Ambulatory Visit (INDEPENDENT_AMBULATORY_CARE_PROVIDER_SITE_OTHER): Payer: No Typology Code available for payment source | Admitting: Internal Medicine

## 2013-02-02 VITALS — BP 125/83 | HR 72 | Temp 97.6°F | Ht 65.5 in | Wt 185.9 lb

## 2013-02-02 DIAGNOSIS — G43909 Migraine, unspecified, not intractable, without status migrainosus: Secondary | ICD-10-CM

## 2013-02-02 DIAGNOSIS — M79609 Pain in unspecified limb: Secondary | ICD-10-CM

## 2013-02-02 DIAGNOSIS — M79602 Pain in left arm: Secondary | ICD-10-CM

## 2013-02-02 MED ORDER — UNIVERSAL ARM SLING MISC: 1.0000 | Freq: Every day | Status: DC | PRN

## 2013-02-02 NOTE — Patient Instructions (Addendum)
-  Call us or go to the Emergency Department if you develop severe pain in your left shoulder or if have numbing or extremely cold left hand/arm.  -Continue taking Tylenol #3 and flexeril as needed for your musculoskeletal pain.  -Use the arm sling as needed to alleviated some of the pain/discomfort.  -Follow up with [place] on August 14th.

## 2013-02-03 ENCOUNTER — Other Ambulatory Visit: Payer: Self-pay | Admitting: Internal Medicine

## 2013-02-03 DIAGNOSIS — G43909 Migraine, unspecified, not intractable, without status migrainosus: Secondary | ICD-10-CM

## 2013-02-03 MED ORDER — ACETAMINOPHEN-CODEINE #3 300-30 MG PO TABS: 1.0000 | ORAL_TABLET | Freq: Four times a day (QID) | ORAL | Status: DC | PRN

## 2013-02-03 NOTE — Assessment & Plan Note (Signed)
Her pain is 9/10, Norco triggered her migraine and tramadol may not be a good option for her given her history of esophageal bleed.  She states that he pain improves with arm rest and she requests an arm sling.  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  -Pt advised to take Tylenol #3 q6h PRN for her pain and headache (she already has the prescription and may need a refill during her next visit). -Left arm sling from the [employer] given to her during this visit. She was encouraged to move her arm as tolerated several times per day.  -Work note provided for return to work on August 14th.  -She has follow up appointment for August 14th.

## 2013-02-03 NOTE — Telephone Encounter (Signed)
Hi Helen, could you call this prescription in for me? I appreciate it! SK
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
## 2013-02-04 NOTE — Telephone Encounter (Signed)
Called to pharm 

## 2013-02-09 NOTE — Progress Notes (Signed)
Case discussed with Dr. Kennerly soon after the resident saw the patient.  We reviewed the resident's history and exam and pertinent patient test results.  I agree with the assessment, diagnosis, and plan of care documented in the resident's note. 

## 2013-02-10 ENCOUNTER — Encounter: Payer: Self-pay | Admitting: Internal Medicine

## 2013-02-10 ENCOUNTER — Ambulatory Visit (INDEPENDENT_AMBULATORY_CARE_PROVIDER_SITE_OTHER): Payer: No Typology Code available for payment source | Admitting: Internal Medicine

## 2013-02-10 VITALS — BP 135/90 | HR 89 | Temp 98.6°F | Wt 189.2 lb

## 2013-02-10 DIAGNOSIS — M25529 Pain in unspecified elbow: Secondary | ICD-10-CM

## 2013-02-10 DIAGNOSIS — Z0000 Encounter for general adult medical examination without abnormal findings: Secondary | ICD-10-CM

## 2013-02-10 DIAGNOSIS — J4520 Mild intermittent asthma, uncomplicated: Secondary | ICD-10-CM

## 2013-02-10 DIAGNOSIS — J45909 Unspecified asthma, uncomplicated: Secondary | ICD-10-CM

## 2013-02-10 DIAGNOSIS — M25539 Pain in unspecified wrist: Secondary | ICD-10-CM

## 2013-02-10 DIAGNOSIS — Z124 Encounter for screening for malignant neoplasm of cervix: Secondary | ICD-10-CM

## 2013-02-10 NOTE — Progress Notes (Signed)
  Subjective:    Patient ID: [name], female    DOB: [birth year], 32 y.o.   MRN: [id number]  HPI [name] is a 32 year old with PH of asthma, chronic migraines who comes in for Pap smear. She reports that the pain in her left elbow and wrist are improving, she takes Tylenol #3 for this pain with good relief. She had to use her albuterol inhaler at least twice this month.    Review of Systems  Constitutional: Negative for fever, chills, diaphoresis, activity change, appetite change, fatigue and unexpected weight change.  Respiratory: Negative for cough, shortness of breath and wheezing.   Cardiovascular: Negative for chest pain, palpitations and leg swelling.  Gastrointestinal: Negative for abdominal pain.  Genitourinary: Negative for vaginal bleeding, vaginal discharge and vaginal pain.  Musculoskeletal: Positive for arthralgias.       Left wrist and elbow pain  Skin: Negative for color change, pallor, rash and wound.  Neurological: Positive for headaches.       Intermittent migraines  Psychiatric/Behavioral: Negative for behavioral problems and agitation.       Objective:   Physical Exam  Nursing note and vitals reviewed. Constitutional: She is oriented to person, place, and time. She appears well-developed and well-nourished. No distress.  Eyes: Conjunctivae are normal. No scleral icterus.  Cardiovascular: Normal rate and regular rhythm.   Pulmonary/Chest: Effort normal and breath sounds normal. No respiratory distress. She has no wheezes. She has no rales.  Abdominal: Soft.  Genitourinary: Vagina normal and uterus normal. No vaginal discharge found.  Pelvic exam: no lesions, cuts, in vulva or vagina. No  Adnexa mass bilaterally; no cervical motion tenderness; no vaginal bleeding or discharge.   Musculoskeletal: She exhibits no edema and no tenderness.  Neurological: She is alert and oriented to person, place, and time.  Skin: Skin is warm and dry. No rash noted. She is  not diaphoretic. No erythema. No pallor.  Psychiatric: She has a normal mood and affect. Her behavior is normal.          Assessment & Plan:

## 2013-02-10 NOTE — Patient Instructions (Signed)
-  We will call you to set up the appointment for your lung function test.  -The flu vaccine will be available at the end of the month.  -We will call you if your Pap smear is abnormal.  -Follow up in 3 months or sooner if you have any acute medical problems.

## 2013-02-11 DIAGNOSIS — Z124 Encounter for screening for malignant neoplasm of cervix: Secondary | ICD-10-CM | POA: Insufficient documentation

## 2013-02-11 NOTE — Assessment & Plan Note (Addendum)
Ordered PFT. Scheduled for 8/20 at 9AM.

## 2013-02-11 NOTE — Assessment & Plan Note (Signed)
Left elbow and wrist with improved pain. Pt will return to work on 8/14.

## 2013-02-11 NOTE — Progress Notes (Signed)
Case discussed with Dr. Kennerly soon after the resident saw the patient.  We reviewed the resident's history and exam and pertinent patient test results.  I agree with the assessment, diagnosis, and plan of care documented in the resident's note. 

## 2013-02-11 NOTE — Assessment & Plan Note (Signed)
Normal pelvic exam. No vaginal discharge, bleeding, or pain. Will call pt only if abnormal Pap smear results per pt's request.

## 2013-02-12 ENCOUNTER — Ambulatory Visit: Payer: No Typology Code available for payment source | Admitting: Internal Medicine

## 2013-02-18 ENCOUNTER — Encounter (HOSPITAL_COMMUNITY): Payer: No Typology Code available for payment source

## 2013-02-20 ENCOUNTER — Encounter (HOSPITAL_COMMUNITY): Payer: No Typology Code available for payment source

## 2013-02-23 ENCOUNTER — Other Ambulatory Visit: Payer: Self-pay | Admitting: *Deleted

## 2013-02-23 DIAGNOSIS — G43909 Migraine, unspecified, not intractable, without status migrainosus: Secondary | ICD-10-CM

## 2013-02-23 MED ORDER — ACETAMINOPHEN-CODEINE #3 300-30 MG PO TABS: 1.0000 | ORAL_TABLET | Freq: Four times a day (QID) | ORAL | Status: DC | PRN

## 2013-02-23 NOTE — Telephone Encounter (Signed)
Rx called in to pharmacy - pt aware. 

## 2013-02-24 ENCOUNTER — Inpatient Hospital Stay (HOSPITAL_COMMUNITY): Admission: RE | Admit: 2013-02-24 | Payer: No Typology Code available for payment source | Source: Ambulatory Visit

## 2013-03-09 ENCOUNTER — Other Ambulatory Visit: Payer: Self-pay | Admitting: *Deleted

## 2013-03-09 DIAGNOSIS — G43909 Migraine, unspecified, not intractable, without status migrainosus: Secondary | ICD-10-CM

## 2013-03-09 MED ORDER — ACETAMINOPHEN-CODEINE #3 300-30 MG PO TABS: 1.0000 | ORAL_TABLET | Freq: Four times a day (QID) | ORAL | Status: DC | PRN

## 2013-03-09 NOTE — Telephone Encounter (Signed)
Rx called in to pharmacy. 
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
## 2013-03-27 ENCOUNTER — Other Ambulatory Visit: Payer: Self-pay | Admitting: *Deleted

## 2013-03-27 DIAGNOSIS — G43909 Migraine, unspecified, not intractable, without status migrainosus: Secondary | ICD-10-CM

## 2013-03-30 NOTE — Telephone Encounter (Signed)
Refill is too soon, pt has follow up appointment on 10/06, will address refill then. Thanks! SK

## 2013-03-31 NOTE — Telephone Encounter (Signed)
Pt aware - had no knowledge of appt 04/06/13 and unable to keep. Will call back about changing appt.

## 2013-04-06 ENCOUNTER — Ambulatory Visit: Payer: No Typology Code available for payment source

## 2013-04-09 ENCOUNTER — Encounter (HOSPITAL_COMMUNITY): Payer: Self-pay | Admitting: Emergency Medicine

## 2013-04-09 ENCOUNTER — Emergency Department (HOSPITAL_COMMUNITY)
Admission: EM | Admit: 2013-04-09 | Discharge: 2013-04-09 | Disposition: A | Discharge status: Home / Self Care | Payer: No Typology Code available for payment source | Attending: Emergency Medicine | Admitting: Emergency Medicine

## 2013-04-09 DIAGNOSIS — G43909 Migraine, unspecified, not intractable, without status migrainosus: Secondary | ICD-10-CM | POA: Insufficient documentation

## 2013-04-09 DIAGNOSIS — H00026 Hordeolum internum left eye, unspecified eyelid: Secondary | ICD-10-CM

## 2013-04-09 DIAGNOSIS — Z862 Personal history of diseases of the blood and blood-forming organs and certain disorders involving the immune mechanism: Secondary | ICD-10-CM | POA: Insufficient documentation

## 2013-04-09 DIAGNOSIS — J45909 Unspecified asthma, uncomplicated: Secondary | ICD-10-CM | POA: Insufficient documentation

## 2013-04-09 DIAGNOSIS — Z79899 Other long term (current) drug therapy: Secondary | ICD-10-CM | POA: Insufficient documentation

## 2013-04-09 DIAGNOSIS — H00019 Hordeolum externum unspecified eye, unspecified eyelid: Secondary | ICD-10-CM | POA: Insufficient documentation

## 2013-04-09 DIAGNOSIS — Z8719 Personal history of other diseases of the digestive system: Secondary | ICD-10-CM | POA: Insufficient documentation

## 2013-04-09 DIAGNOSIS — IMO0002 Reserved for concepts with insufficient information to code with codable children: Secondary | ICD-10-CM | POA: Insufficient documentation

## 2013-04-09 NOTE — ED Provider Notes (Signed)
CSN: [id number]     Arrival date & time 04/09/13  0801 History   First MD Initiated Contact with Patient 04/09/13 [phone]     Chief Complaint  Patient presents with  . Eye Pain   (Consider location/radiation/quality/duration/timing/severity/associated sxs/prior Treatment) HPI Comments: 32 year old female with a painful swelling of her left eyelid which started yesterday. She states she has had a stye before and this feels like that. No change in vision. Draining yellow fluid.  Patient is a 32 y.o. female presenting with eye pain.  Eye Pain This is a new problem. The current episode started yesterday. The problem occurs constantly. The problem has been gradually worsening. Pertinent negatives include no chest pain, no abdominal pain and no shortness of breath. Associated symptoms comments: No fevers, no blurry vision.. Nothing aggravates the symptoms. Nothing relieves the symptoms. She has tried a warm compress for the symptoms. The treatment provided mild relief.    Past Medical History  Diagnosis Date  . GERD (gastroesophageal reflux disease)   . Erosive esophagitis     distal esophagus, with low grade bleed 03/2009  . IBS (irritable bowel syndrome)   . Anemia   . History of migraine headaches   . Asthma   . FAOZHYQM(578.4)    Past Surgical History  Procedure Laterality Date  . No past surgeries     Family History  Problem Relation Age of Onset  . Heart disease Mother   . Migraines Mother   . Migraines Father   . Asthma Daughter    History  Substance Use Topics  . Smoking status: Never Smoker   . Smokeless tobacco: Never Used  . Alcohol Use: No     Comment: occasional on holidays   OB History   Grav Para Term Preterm Abortions TAB SAB Ect Mult Living                 Review of Systems  Constitutional: Negative for fever.  HENT: Negative for congestion.   Eyes: Positive for pain.  Respiratory: Negative for cough and shortness of breath.   Cardiovascular: Negative for  chest pain.  Gastrointestinal: Negative for nausea, vomiting, abdominal pain and diarrhea.  All other systems reviewed and are negative.    Allergies  Ibuprofen  Home Medications   Current Outpatient Rx  Name  Route  Sig  Dispense  Refill  . acetaminophen-codeine (TYLENOL #3) 300-30 MG per tablet   Oral   Take 1 tablet by mouth every 6 (six) hours as needed for pain.   30 tablet   0   . albuterol (PROVENTIL HFA;VENTOLIN HFA) 108 (90 BASE) MCG/ACT inhaler   Inhalation   Inhale 2 puffs into the lungs every 6 (six) hours as needed. For shortness of breath   1 Inhaler   5   . cyclobenzaprine (FLEXERIL) 10 MG tablet   Oral   Take 1 tablet (10 mg total) by mouth 2 (two) times daily as needed for muscle spasms.   20 tablet   0   . Elastic Bandages & Supports (UNIVERSAL ARM SLING) MISC   Other   1 each by Other route daily as needed (For left arm pain, to provide comfort as MSK pain improves. May remove for bathing, eating, or other activies as tolerated.).   1 each   0   . HYDROcodone-acetaminophen (NORCO/VICODIN) 5-325 MG per tablet   Oral   Take 2 tablets by mouth every 4 (four) hours as needed for pain.   6 tablet  0   . mometasone (NASONEX) 50 MCG/ACT nasal spray   Nasal   Place 2 sprays into the nose daily.   17 g   2   . traMADol (ULTRAM) 50 MG tablet   Oral   Take 1 tablet (50 mg total) by mouth every 6 (six) hours as needed for pain.   20 tablet   0    BP 132/83  Pulse 97  Temp(Src) 98.2 F (36.8 C) (Oral)  Resp 18  Ht 5\' 5"  (1.651 m)  Wt 183 lb (83.008 kg)  BMI 30.45 kg/m2  SpO2 98% Physical Exam  Nursing note and vitals reviewed. Constitutional: She is oriented to person, place, and time. She appears well-developed and well-nourished. No distress.  HENT:  Head: Normocephalic and atraumatic.  Eyes: Conjunctivae and EOM are normal. Pupils are equal, round, and reactive to light. Left eye exhibits hordeolum (left lateral upper eyelid. Mild amount  of yellow discharge.). No scleral icterus.  Neck: Neck supple.  Cardiovascular: Normal rate and intact distal pulses.   Pulmonary/Chest: Effort normal. No stridor. No respiratory distress.  Abdominal: Normal appearance. She exhibits no distension.  Neurological: She is alert and oriented to person, place, and time.  Skin: Skin is warm and dry. No rash noted.  Psychiatric: She has a normal mood and affect. Her behavior is normal.    ED Course  Procedures (including critical care time) Labs Review Labs Reviewed - No data to display Imaging Review No results found.  MDM   1. Hordeolum eyelid, internal, left    32 year old female with painful lump in her left upper eyelid since yesterday. Exam consistent with hordeolum. Advised warm compresses and ophthalmology followup. Gave return precautions for worsening swelling, fevers, change in vision, or other concerning symptoms.  Candyce Churn, MD 04/09/13 [phone]

## 2013-04-09 NOTE — ED Notes (Signed)
Pt arrives to ed c/o left eye pain x 2 days. Pt has draiage.  Pt denies changes in vision, recent illness/injury.  Pt has no other complaints at this time.  Caox4, pmsx4, nad.

## 2013-04-10 MED ORDER — ACETAMINOPHEN-CODEINE #3 300-30 MG PO TABS: 1.0000 | ORAL_TABLET | Freq: Four times a day (QID) | ORAL | Status: DC | PRN

## 2013-04-10 NOTE — Telephone Encounter (Signed)
Rx called in to pharmacy. 

## 2013-05-22 ENCOUNTER — Emergency Department (HOSPITAL_COMMUNITY)
Admission: EM | Admit: 2013-05-22 | Discharge: 2013-05-22 | Disposition: A | Discharge status: Home / Self Care | Payer: No Typology Code available for payment source | Attending: Emergency Medicine | Admitting: Emergency Medicine

## 2013-05-22 ENCOUNTER — Encounter (HOSPITAL_COMMUNITY): Payer: Self-pay | Admitting: Emergency Medicine

## 2013-05-22 ENCOUNTER — Emergency Department (HOSPITAL_COMMUNITY): Payer: No Typology Code available for payment source

## 2013-05-22 DIAGNOSIS — Z8719 Personal history of other diseases of the digestive system: Secondary | ICD-10-CM | POA: Insufficient documentation

## 2013-05-22 DIAGNOSIS — K219 Gastro-esophageal reflux disease without esophagitis: Secondary | ICD-10-CM | POA: Insufficient documentation

## 2013-05-22 DIAGNOSIS — Z888 Allergy status to other drugs, medicaments and biological substances status: Secondary | ICD-10-CM | POA: Insufficient documentation

## 2013-05-22 DIAGNOSIS — Z8669 Personal history of other diseases of the nervous system and sense organs: Secondary | ICD-10-CM | POA: Insufficient documentation

## 2013-05-22 DIAGNOSIS — M7918 Myalgia, other site: Secondary | ICD-10-CM

## 2013-05-22 DIAGNOSIS — K589 Irritable bowel syndrome without diarrhea: Secondary | ICD-10-CM | POA: Insufficient documentation

## 2013-05-22 DIAGNOSIS — R110 Nausea: Secondary | ICD-10-CM | POA: Insufficient documentation

## 2013-05-22 DIAGNOSIS — M545 Low back pain, unspecified: Secondary | ICD-10-CM | POA: Insufficient documentation

## 2013-05-22 DIAGNOSIS — Z79899 Other long term (current) drug therapy: Secondary | ICD-10-CM | POA: Insufficient documentation

## 2013-05-22 DIAGNOSIS — J45909 Unspecified asthma, uncomplicated: Secondary | ICD-10-CM | POA: Insufficient documentation

## 2013-05-22 DIAGNOSIS — D649 Anemia, unspecified: Secondary | ICD-10-CM | POA: Insufficient documentation

## 2013-05-22 DIAGNOSIS — Z3202 Encounter for pregnancy test, result negative: Secondary | ICD-10-CM | POA: Insufficient documentation

## 2013-05-22 LAB — COMPREHENSIVE METABOLIC PANEL
ALT: 25 U/L (ref 0–35)
AST: 22 U/L (ref 0–37)
Alkaline Phosphatase: 60 U/L (ref 39–117)
CO2: 24 mEq/L (ref 19–32)
Calcium: 9 mg/dL (ref 8.4–10.5)
Chloride: 104 mEq/L (ref 96–112)
GFR calc Af Amer: >90 mL/min (ref >90)
GFR calc non Af Amer: 88 mL/min — ABNORMAL LOW (ref >90)
Glucose, Bld: 100 mg/dL — ABNORMAL HIGH (ref 70–99)
Potassium: 3.9 mEq/L (ref 3.5–5.1)
Sodium: 138 mEq/L (ref 135–145)
Total Bilirubin: 0.3 mg/dL (ref 0.3–1.2)

## 2013-05-22 LAB — URINE MICROSCOPIC-ADD ON

## 2013-05-22 LAB — URINALYSIS, ROUTINE W REFLEX MICROSCOPIC
Bilirubin Urine: NEGATIVE
Glucose, UA: NEGATIVE mg/dL
Protein, ur: NEGATIVE mg/dL
Urobilinogen, UA: 0.2 mg/dL (ref 0.0–1.0)

## 2013-05-22 LAB — CBC WITH DIFFERENTIAL/PLATELET
Basophils Absolute: 0 10*3/uL (ref 0.0–0.1)
Eosinophils Relative: 4 % (ref 0–5)
Lymphocytes Relative: 41 % (ref 12–46)
Lymphs Abs: 2.7 10*3/uL (ref 0.7–4.0)
MCV: 92.8 fL (ref 78.0–100.0)
Neutro Abs: 3.1 10*3/uL (ref 1.7–7.7)
Platelets: 306 10*3/uL (ref 150–400)
RBC: 3.9 MIL/uL (ref 3.87–5.11)
RDW: 13.5 % (ref 11.5–15.5)
WBC: 6.7 10*3/uL (ref 4.0–10.5)

## 2013-05-22 LAB — POCT PREGNANCY, URINE: Preg Test, Ur: NEGATIVE

## 2013-05-22 MED ORDER — OXYCODONE-ACETAMINOPHEN 5-325 MG PO TABS
2.0000 | ORAL_TABLET | Freq: Once | ORAL | Status: AC
  Administered 2013-05-22: 2 via ORAL
  Filled 2013-05-22: qty 2

## 2013-05-22 MED ORDER — METHOCARBAMOL 500 MG PO TABS: 1000.0000 mg | ORAL_TABLET | Freq: Four times a day (QID) | ORAL | Status: DC | PRN

## 2013-05-22 MED ORDER — HYDROCODONE-ACETAMINOPHEN 5-325 MG PO TABS: ORAL_TABLET | ORAL | Status: DC

## 2013-05-22 MED ORDER — KETOROLAC TROMETHAMINE 60 MG/2ML IM SOLN
60.0000 mg | Freq: Once | INTRAMUSCULAR | Status: AC
  Administered 2013-05-22: 60 mg via INTRAMUSCULAR
  Filled 2013-05-22: qty 2

## 2013-05-22 NOTE — ED Notes (Signed)
Pt c/o R lower back pain for several weeks.  Pain increases with movement.  Pt also feels nauseated in the am.  Denies abdominal pain, diarrhea or constipation.

## 2013-05-22 NOTE — ED Notes (Signed)
Patient transported to X-ray 

## 2013-05-22 NOTE — ED Provider Notes (Signed)
CSN: [id number]     Arrival date & time 05/22/13  7829 History   First MD Initiated Contact with Patient 05/22/13 [phone]     Chief Complaint  Patient presents with  . Back Pain  . Nausea    HPI Pt was seen at 0910. Per pt, c/o gradual onset and persistence of constant right sided low back "pain" for the past several months, worse over the past several weeks. Pain worsens with palpation of the area and body position changes. Has been associated with mild nausea. Denies incont/retention of bowel or bladder, no saddle anesthesia, no focal motor weakness, no tingling/numbness in extremities, no fevers, no injury, no abd pain.    Past Medical History  Diagnosis Date  . GERD (gastroesophageal reflux disease)   . Erosive esophagitis     distal esophagus, with low grade bleed 03/2009  . IBS (irritable bowel syndrome)   . Anemia   . History of migraine headaches   . Asthma   . HYQMVHQI(696.2)    Past Surgical History  Procedure Laterality Date  . No past surgeries     Family History  Problem Relation Age of Onset  . Heart disease Mother   . Migraines Mother   . Migraines Father   . Asthma Daughter    History  Substance Use Topics  . Smoking status: Never Smoker   . Smokeless tobacco: Never Used  . Alcohol Use: No     Comment: occasional on holidays    Review of Systems ROS: Statement: All systems negative except as marked or noted in the HPI; Constitutional: Negative for fever and chills. ; ; Eyes: Negative for eye pain, redness and discharge. ; ; ENMT: Negative for ear pain, hoarseness, nasal congestion, sinus pressure and sore throat. ; ; Cardiovascular: Negative for chest pain, palpitations, diaphoresis, dyspnea and peripheral edema. ; ; Respiratory: Negative for cough, wheezing and stridor. ; ; Gastrointestinal: +nausea. Negative for vomiting, diarrhea, abdominal pain, blood in stool, hematemesis, jaundice and rectal bleeding. . ; ; Genitourinary: Negative for dysuria, flank pain  and hematuria. ; ; GYN:  No vaginal bleeding, no vaginal discharge, no vulvar pain.;; Musculoskeletal: +LBP. Negative for neck pain. Negative for swelling and trauma.; ; Skin: Negative for pruritus, rash, abrasions, blisters, bruising and skin lesion.; ; Neuro: Negative for headache, lightheadedness and neck stiffness. Negative for weakness, altered level of consciousness , altered mental status, extremity weakness, paresthesias, involuntary movement, seizure and syncope.       Allergies  Ibuprofen  Home Medications   Current Outpatient Rx  Name  Route  Sig  Dispense  Refill  . albuterol (PROVENTIL HFA;VENTOLIN HFA) 108 (90 BASE) MCG/ACT inhaler   Inhalation   Inhale 2 puffs into the lungs every 6 (six) hours as needed. For shortness of breath   1 Inhaler   5    BP 130/74  Pulse 100  Temp(Src) 98 F (36.7 C) (Oral)  Resp 20  Ht 5\' 5"  (1.651 m)  Wt 188 lb 8 oz (85.503 kg)  BMI 31.37 kg/m2  SpO2 100%  LMP 03/16/2013 Physical Exam 0915: Physical examination:  Nursing notes reviewed; Vital signs and O2 SAT reviewed;  Constitutional: Well developed, Well nourished, Well hydrated, In no acute distress; Head:  Normocephalic, atraumatic; Eyes: EOMI, PERRL, No scleral icterus; ENMT: Mouth and pharynx normal, Mucous membranes moist; Neck: Supple, Full range of motion, No lymphadenopathy; Cardiovascular: Regular rate and rhythm, No gallop; Respiratory: Breath sounds clear & equal bilaterally, No rales, rhonchi, wheezes.  Speaking  full sentences with ease, Normal respiratory effort/excursion; Chest: Nontender, Movement normal; Abdomen: Soft, Nontender, Nondistended, Normal bowel sounds; Genitourinary: No CVA tenderness; Spine:  No midline CS, TS, LS tenderness. +TTP right lumbar paraspinal muscles.;; Extremities: Pulses normal, No tenderness, No edema, No calf edema or asymmetry.; Neuro: AA&Ox3, Major CN grossly intact.  Speech clear. Strength 5/5 equal bilat UE's and LE's, including great toe  dorsiflexion.  DTR 2/4 equal bilat UE's and LE's.  No gross sensory deficits.  Neg straight leg raises bilat.; Skin: Color normal, Warm, Dry.   ED Course  Procedures    EKG Interpretation   None       MDM  MDM Reviewed: previous chart, nursing note and vitals Reviewed previous: labs Interpretation: x-ray and labs     Results for orders placed during the hospital encounter of 05/22/13  CBC WITH DIFFERENTIAL      Result Value Range   WBC 6.7  4.0 - 10.5 K/uL   RBC 3.90  3.87 - 5.11 MIL/uL   Hemoglobin 12.6  12.0 - 15.0 g/dL   HCT 16.1  09.6 - 04.5 %   MCV 92.8  78.0 - 100.0 fL   MCH 32.3  26.0 - 34.0 pg   MCHC 34.8  30.0 - 36.0 g/dL   RDW 40.9  81.1 - 91.4 %   Platelets 306  150 - 400 K/uL   Neutrophils Relative % 47  43 - 77 %   Neutro Abs 3.1  1.7 - 7.7 K/uL   Lymphocytes Relative 41  12 - 46 %   Lymphs Abs 2.7  0.7 - 4.0 K/uL   Monocytes Relative 8  3 - 12 %   Monocytes Absolute 0.5  0.1 - 1.0 K/uL   Eosinophils Relative 4  0 - 5 %   Eosinophils Absolute 0.3  0.0 - 0.7 K/uL   Basophils Relative 0  0 - 1 %   Basophils Absolute 0.0  0.0 - 0.1 K/uL  COMPREHENSIVE METABOLIC PANEL      Result Value Range   Sodium 138  135 - 145 mEq/L   Potassium 3.9  3.5 - 5.1 mEq/L   Chloride 104  96 - 112 mEq/L   CO2 24  19 - 32 mEq/L   Glucose, Bld 100 (*) 70 - 99 mg/dL   BUN 9  6 - 23 mg/dL   Creatinine, Ser 7.82  0.50 - 1.10 mg/dL   Calcium 9.0  8.4 - 95.6 mg/dL   Total Protein 7.4  6.0 - 8.3 g/dL   Albumin 3.9  3.5 - 5.2 g/dL   AST 22  0 - 37 U/L   ALT 25  0 - 35 U/L   Alkaline Phosphatase 60  39 - 117 U/L   Total Bilirubin 0.3  0.3 - 1.2 mg/dL   GFR calc non Af Amer 88 (*) >90 mL/min   GFR calc Af Amer >90  >90 mL/min  LIPASE, BLOOD      Result Value Range   Lipase 14  11 - 59 U/L  URINALYSIS, ROUTINE W REFLEX MICROSCOPIC      Result Value Range   Color, Urine YELLOW  YELLOW   APPearance CLOUDY (*) CLEAR   Specific Gravity, Urine 1.024  1.005 - 1.030   pH 6.5   5.0 - 8.0   Glucose, UA NEGATIVE  NEGATIVE mg/dL   Hgb urine dipstick TRACE (*) NEGATIVE   Bilirubin Urine NEGATIVE  NEGATIVE   Ketones, ur NEGATIVE  NEGATIVE mg/dL  Protein, ur NEGATIVE  NEGATIVE mg/dL   Urobilinogen, UA 0.2  0.0 - 1.0 mg/dL   Nitrite NEGATIVE  NEGATIVE   Leukocytes, UA NEGATIVE  NEGATIVE  URINE MICROSCOPIC-ADD ON      Result Value Range   Squamous Epithelial / LPF MANY (*) RARE   WBC, UA 0-2  <3 WBC/hpf   RBC / HPF 0-2  <3 RBC/hpf   Bacteria, UA FEW (*) RARE   Urine-Other AMORPHOUS URATES/PHOSPHATES    POCT PREGNANCY, URINE      Result Value Range   Preg Test, Ur NEGATIVE  NEGATIVE   Dg Abd Acute W/chest 05/22/2013   CLINICAL DATA:  Pain and nausea  EXAM: ACUTE ABDOMEN SERIES (ABDOMEN 2 VIEW & CHEST 1 VIEW)  COMPARISON:  CT abdomen and pelvis February 05, 2012; chest radiograph Nov 24, 2009  FINDINGS: PA chest: Lungs are clear. Heart size and pulmonary vascularity are normal. No adenopathy.  Supine and upright abdomen: There is moderate stool throughout the colon. Bowel gas pattern is unremarkable. No obstruction or free air. No abnormal calcifications. There is mild lumbar levoscoliosis.  IMPRESSION: Bowel gas pattern unremarkable. Moderate stool throughout colon. Lungs clear.   Electronically Signed   By: Bretta Bang M.D.   On: 05/22/2013 09:55    1045:  Improved after meds and wants to go home now. Will tx LBP symptomatically at this time. Dx and testing d/w pt.  Questions answered.  Verb understanding, agreeable to d/c home with outpt f/u.    Laray Anger, DO 05/23/13 1154

## 2013-05-25 ENCOUNTER — Other Ambulatory Visit: Payer: Self-pay | Admitting: Internal Medicine

## 2013-05-25 DIAGNOSIS — R51 Headache: Secondary | ICD-10-CM

## 2013-05-25 NOTE — Telephone Encounter (Signed)
Hi [name],     Could this be called in? Epic would not let me e-prescribe it. Thanks! SK

## 2013-05-25 NOTE — Telephone Encounter (Signed)
Called to pharmacy 

## 2013-06-10 ENCOUNTER — Ambulatory Visit: Payer: Self-pay

## 2013-07-02 NOTE — L&D Delivery Note (Signed)
Delivery Note At 5:23 PM a viable and healthy female was delivered via Vaginal, Spontaneous Delivery (Presentation: ; Left Occiput Anterior).  APGAR:8,9; weight- pending.   Placenta status: Intact, Spontaneous, Schultz.  Cord: 3 vessels with the following complications: None.    Anesthesia: Epidural  Episiotomy: None Lacerations: None Suture Repair: N/A Est. Blood Loss (mL): 350  Mom and baby doing well, bonding. FOB at bedside. Mom to postpartum.  Baby to Couplet care / Skin to Skin.  Samon Dishner SHWON 06/13/2014, 5:42 PM

## 2013-09-24 IMAGING — CR DG FINGER INDEX 2+V*R*
3 series · 3 of 3 positions shown · non-contrast
Comparison: None.

CLINICAL DATA: Finger injury with pain.

RIGHT INDEX FINGER 2+V

[x finger pa right]
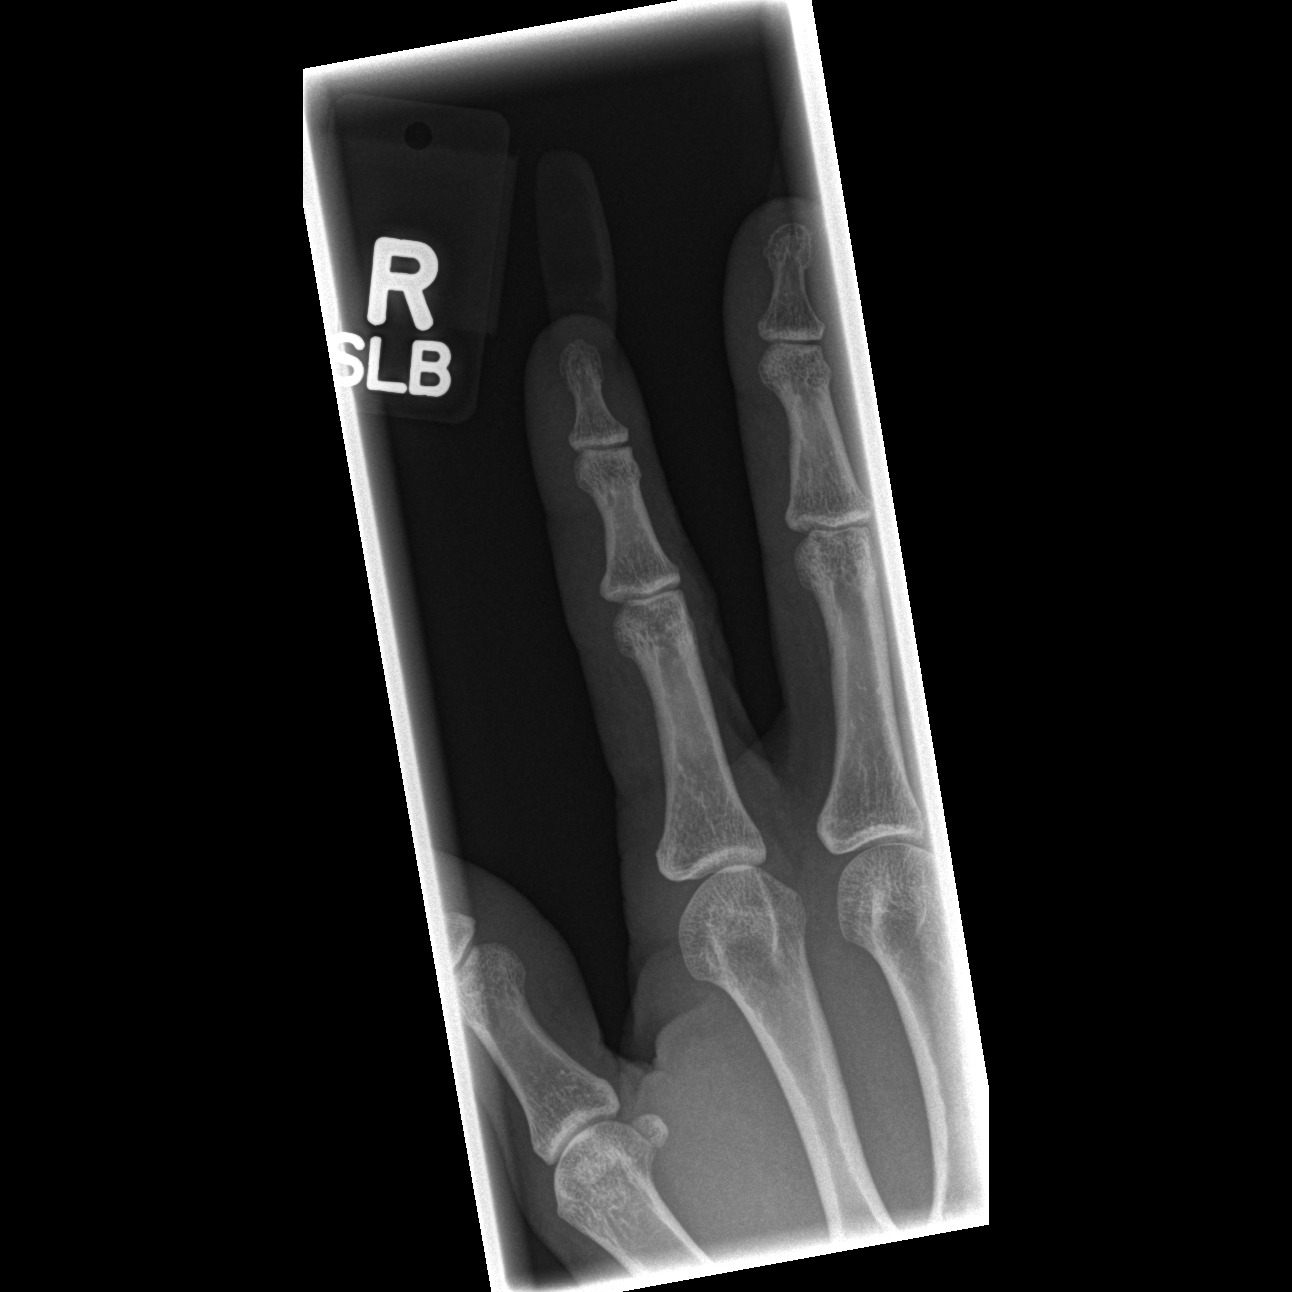

[x finger obl. right]
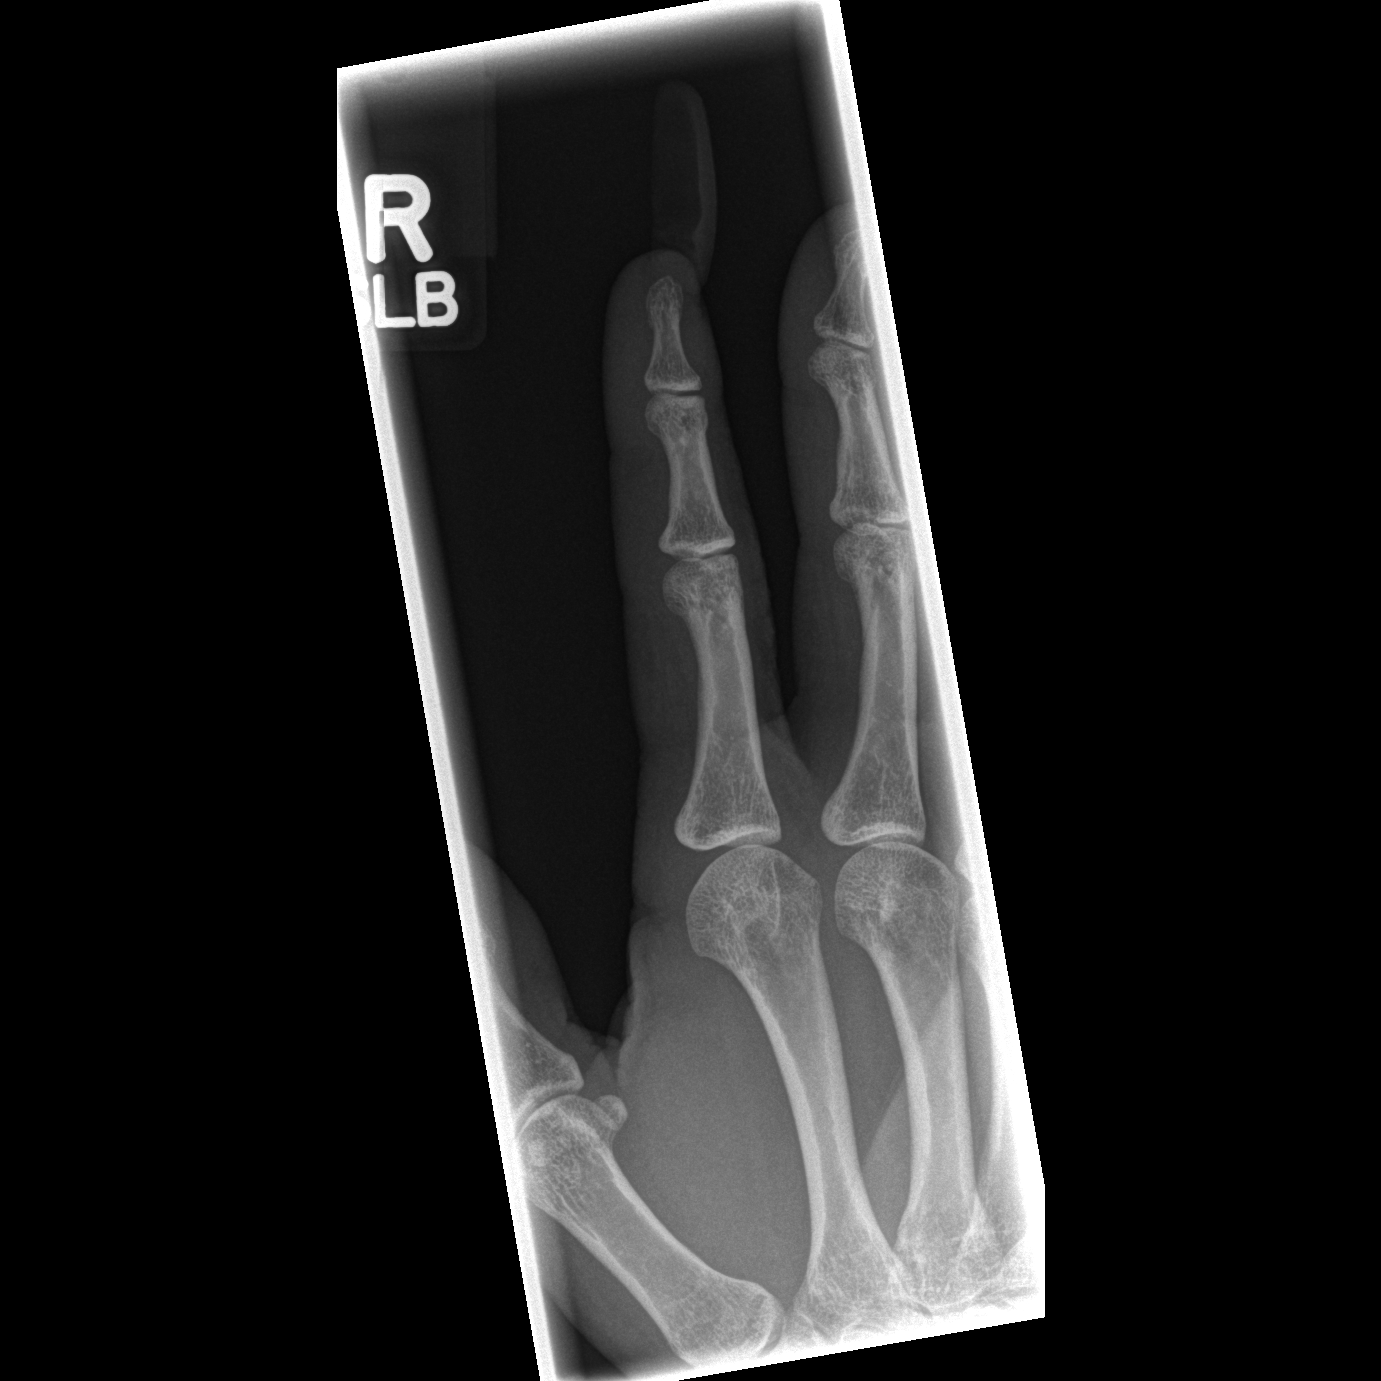

[x finger lateral right]
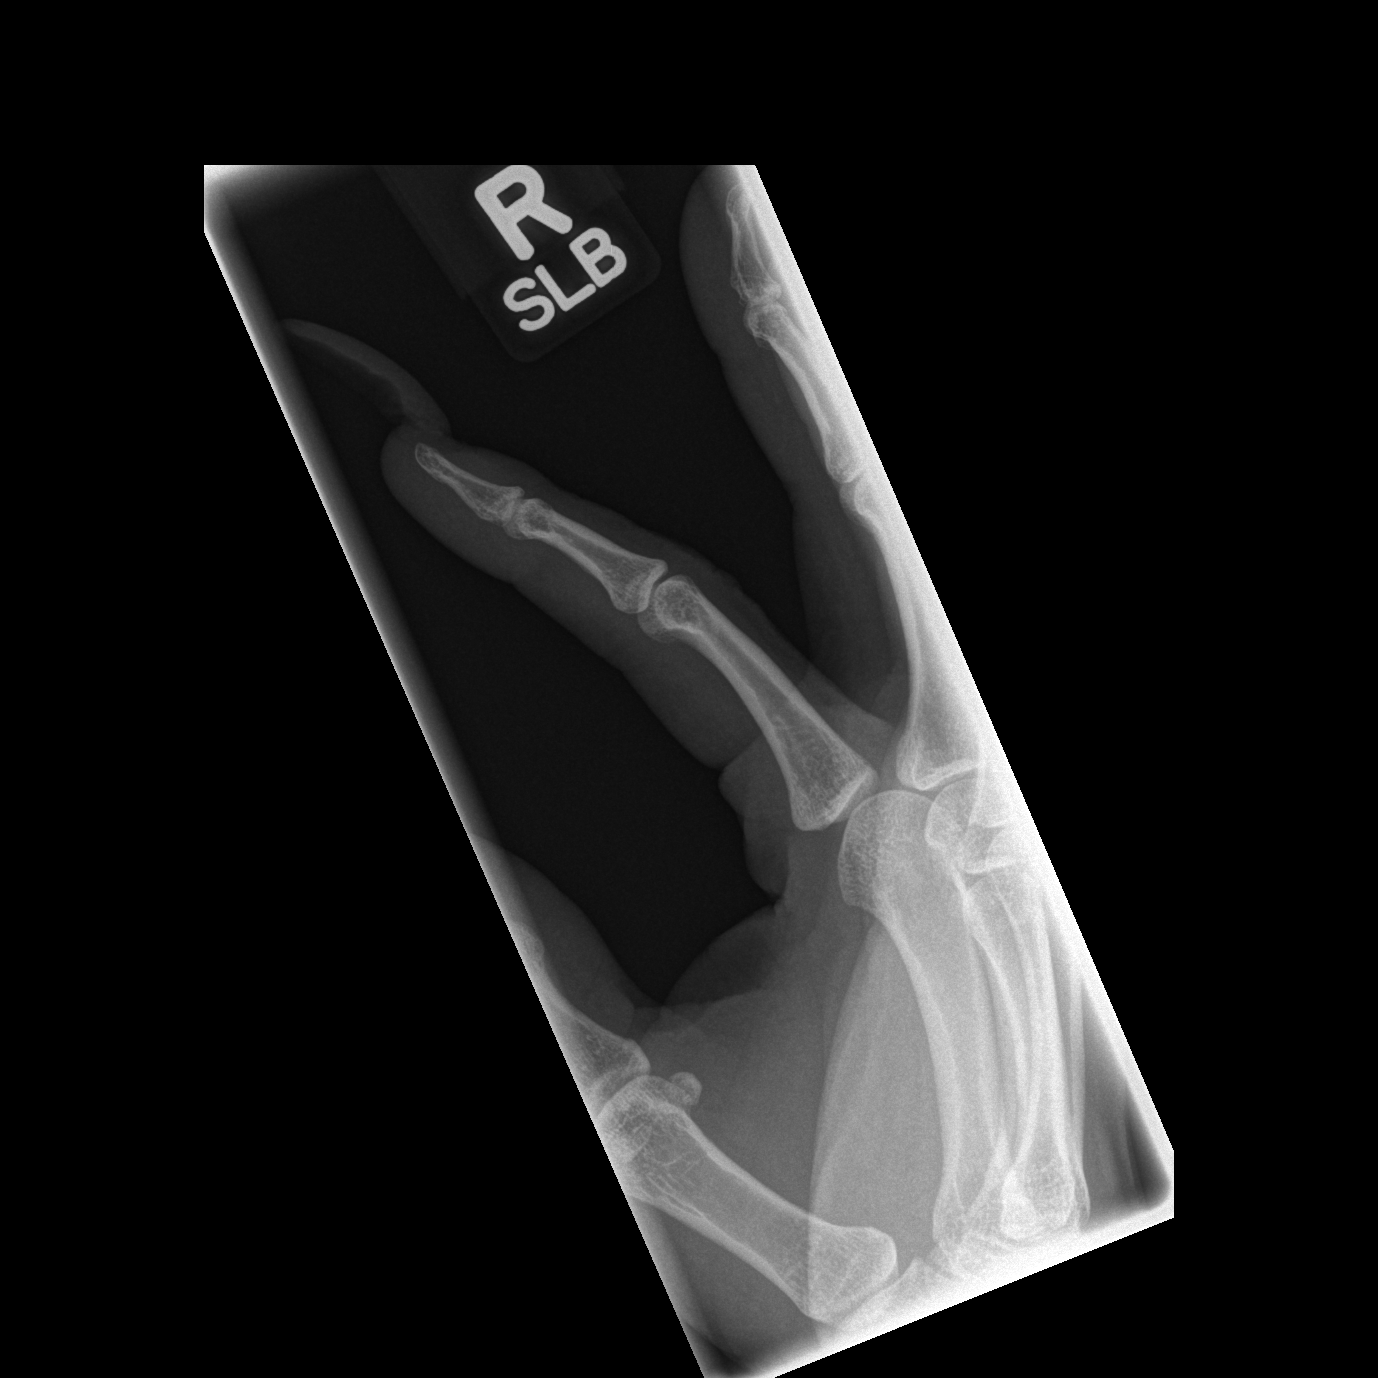

[3 of 3 positions shown; findings below may reference images not displayed]

FINDINGS: Three-view exam of the right index finger shows no
evidence for fracture.  No subluxation or dislocation.  No
worrisome lytic or sclerotic osseous abnormality.
IMPRESSION: No acute bony findings.

## 2013-10-05 ENCOUNTER — Emergency Department (HOSPITAL_COMMUNITY)
Admission: EM | Admit: 2013-10-05 | Discharge: 2013-10-05 | Disposition: A | Discharge status: Home / Self Care | Payer: Medicaid Other | Attending: Emergency Medicine | Admitting: Emergency Medicine

## 2013-10-05 ENCOUNTER — Emergency Department (HOSPITAL_COMMUNITY): Payer: Medicaid Other

## 2013-10-05 ENCOUNTER — Encounter (HOSPITAL_COMMUNITY): Payer: Self-pay | Admitting: Emergency Medicine

## 2013-10-05 DIAGNOSIS — Z79899 Other long term (current) drug therapy: Secondary | ICD-10-CM | POA: Insufficient documentation

## 2013-10-05 DIAGNOSIS — Y929 Unspecified place or not applicable: Secondary | ICD-10-CM | POA: Insufficient documentation

## 2013-10-05 DIAGNOSIS — S6990XA Unspecified injury of unspecified wrist, hand and finger(s), initial encounter: Principal | ICD-10-CM | POA: Insufficient documentation

## 2013-10-05 DIAGNOSIS — J45909 Unspecified asthma, uncomplicated: Secondary | ICD-10-CM | POA: Insufficient documentation

## 2013-10-05 DIAGNOSIS — Z862 Personal history of diseases of the blood and blood-forming organs and certain disorders involving the immune mechanism: Secondary | ICD-10-CM | POA: Insufficient documentation

## 2013-10-05 DIAGNOSIS — IMO0002 Reserved for concepts with insufficient information to code with codable children: Secondary | ICD-10-CM | POA: Insufficient documentation

## 2013-10-05 DIAGNOSIS — Z8719 Personal history of other diseases of the digestive system: Secondary | ICD-10-CM | POA: Insufficient documentation

## 2013-10-05 DIAGNOSIS — Y9389 Activity, other specified: Secondary | ICD-10-CM | POA: Insufficient documentation

## 2013-10-05 DIAGNOSIS — Z8679 Personal history of other diseases of the circulatory system: Secondary | ICD-10-CM | POA: Insufficient documentation

## 2013-10-05 DIAGNOSIS — S6980XA Other specified injuries of unspecified wrist, hand and finger(s), initial encounter: Secondary | ICD-10-CM | POA: Insufficient documentation

## 2013-10-05 DIAGNOSIS — M79644 Pain in right finger(s): Secondary | ICD-10-CM

## 2013-10-05 MED ORDER — HYDROCODONE-ACETAMINOPHEN 5-325 MG PO TABS: 1.0000 | ORAL_TABLET | ORAL | Status: DC | PRN

## 2013-10-05 NOTE — ED Notes (Signed)
Ortho called to apply thumb splint.

## 2013-10-05 NOTE — Progress Notes (Signed)
Orthopedic Tech Progress Note Patient Details:  [name] GasserFealethea D Archer 09/29/1980 [id number]  Ortho Devices Type of Ortho Device: Finger splint Ortho Device/Splint Location: rue Ortho Device/Splint Interventions: Application   Rollande Thursby 10/05/2013, 9:22 PM

## 2013-10-05 NOTE — ED Provider Notes (Signed)
CSN: [id number]     Arrival date & time 10/05/13  1506 History   This chart was scribed for non-physician practitioner, Felicie Morn, NP-C working with Gavin Pound. Oletta Lamas, MD by Luisa Dago, ED scribe. This patient was seen in room TR07C/TR07C and the patient's care was started at 8:34 PM.    Chief Complaint  Patient presents with  . Hand Pain   The history is provided by the patient. No language interpreter was used.   HPI Comments: [name] is a 33 y.o. Female with history of asthma and GERD, who presents to the Emergency Department complaining of sudden onset, worsening right thumb pain that started last night. Pt states that the pain is exarcerbated by movement. She states that she noticed the pain last night while she was cooking. Pt does remember hitting her thumb on something. She describes the pain as "throbbing". Denies taking any OTC medication to alleviate her symptoms. Denies any fever, chills, diaphoresis, abdominal pain, nausea, or emesis.   Past Medical History  Diagnosis Date  . GERD (gastroesophageal reflux disease)   . Erosive esophagitis     distal esophagus, with low grade bleed 03/2009  . IBS (irritable bowel syndrome)   . Anemia   . History of migraine headaches   . Asthma   . WUJWJXBJ(478.2)    Past Surgical History  Procedure Laterality Date  . No past surgeries     Family History  Problem Relation Age of Onset  . Heart disease Mother   . Migraines Mother   . Migraines Father   . Asthma Daughter    History  Substance Use Topics  . Smoking status: Never Smoker   . Smokeless tobacco: Never Used  . Alcohol Use: Yes     Comment: occasional on holidays   OB History   Grav Para Term Preterm Abortions TAB SAB Ect Mult Living                 Review of Systems  Musculoskeletal: Positive for arthralgias.  All other systems reviewed and are negative.      Allergies  Ibuprofen  Home Medications   Current Outpatient Rx  Name  Route  Sig   Dispense  Refill  . albuterol (PROVENTIL HFA;VENTOLIN HFA) 108 (90 BASE) MCG/ACT inhaler   Inhalation   Inhale 2 puffs into the lungs every 6 (six) hours as needed. For shortness of breath   1 Inhaler   5    BP 146/78  Pulse 77  Temp(Src) 98.8 F (37.1 C) (Oral)  Resp 18  SpO2 96%  LMP 09/18/2013  Physical Exam  Nursing note and vitals reviewed. Constitutional: She appears well-developed and well-nourished. No distress.  HENT:  Head: Normocephalic and atraumatic.  Eyes: Conjunctivae are normal. Right eye exhibits no discharge. Left eye exhibits no discharge.  Neck: Neck supple.  Cardiovascular: Normal rate, regular rhythm and normal heart sounds.  Exam reveals no gallop and no friction rub.   No murmur heard. Pulmonary/Chest: Effort normal and breath sounds normal. No respiratory distress.  Abdominal: Soft. She exhibits no distension. There is no tenderness.  Musculoskeletal: She exhibits no edema and no tenderness.  Tender over the DIP joint of the right thumb. Pt is tender to palpation. There is no indication of an infection.   Neurological: She is alert.  Skin: Skin is warm and dry.  Psychiatric: She has a normal mood and affect. Her behavior is normal. Thought content normal.    ED Course  Procedures (  including critical care time)  DIAGNOSTIC STUDIES: Oxygen Saturation is 96% on RA, adequate by my interpretation.    COORDINATION OF CARE: 8:36 PM- Advised pt to take Tylenol during the day for pain control. Will also prescribe narco for night time use only as needed. Will order a thumb splint for pt. Pt advised of plan for treatment and pt agrees.  Labs Review Labs Reviewed - No data to display Imaging Review Dg Finger Thumb Right  10/05/2013   CLINICAL DATA:  Anterior right thumb pain for 2 days, no known injury  EXAM: RIGHT THUMB 2+V  COMPARISON:  01/13/2007  FINDINGS: Osseous mineralization normal.  Joint spaces preserved.  No fracture, dislocation, or bone  destruction.  IMPRESSION: Normal exam.   Electronically Signed   By: Ulyses SouthwardMark  Boles M.D.   On: 10/05/2013 16:48     EKG Interpretation None      MDM   Final diagnoses:  None    Right thumb pain.  No bony abnormality noted on xray. No obvious sign of infection on exam. Suspect contusion.  Splint for comfort, pain medication.  Follow-up with PCP.  I personally performed the services described in this documentation, which was scribed in my presence. The recorded information has been reviewed and is accurate.    Jimmye Normanavid John Zavannah Deblois, NP 10/06/13 [phone]

## 2013-10-05 NOTE — Discharge Instructions (Signed)
Hand Contusion °A hand contusion is a deep bruise on your hand area. Contusions are the result of an injury that caused bleeding under the skin. The contusion may turn blue, purple, or yellow. Minor injuries will give you a painless contusion, but more severe contusions may stay painful and swollen for a few weeks. °CAUSES  °A contusion is usually caused by a blow, trauma, or direct force to an area of the body. °SYMPTOMS  °· Swelling and redness of the injured area. °· Discoloration of the injured area. °· Tenderness and soreness of the injured area. °· Pain. °DIAGNOSIS  °The diagnosis can be made by taking a history and performing a physical exam. An X-ray, CT scan, or MRI may be needed to determine if there were any associated injuries, such as broken bones (fractures). °TREATMENT  °Often, the best treatment for a hand contusion is resting, elevating, icing, and applying cold compresses to the injured area. Over-the-counter medicines may also be recommended for pain control. °HOME CARE INSTRUCTIONS  °· Put ice on the injured area. °· Put ice in a plastic bag. °· Place a towel between your skin and the bag. °· Leave the ice on for 15-20 minutes, 03-04 times a day. °· Only take over-the-counter or prescription medicines as directed by your caregiver. Your caregiver may recommend avoiding anti-inflammatory medicines (aspirin, ibuprofen, and naproxen) for 48 hours because these medicines may increase bruising. °· If told, use an elastic wrap as directed. This can help reduce swelling. You may remove the wrap for sleeping, showering, and bathing. If your fingers become numb, cold, or blue, take the wrap off and reapply it more loosely. °· Elevate your hand with pillows to reduce swelling. °· Avoid overusing your hand if it is painful. °SEEK IMMEDIATE MEDICAL CARE IF:  °· You have increased redness, swelling, or pain in your hand. °· Your swelling or pain is not relieved with medicines. °· You have loss of feeling in  your hand or are unable to move your fingers. °· Your hand turns cold or blue. °· You have pain when you move your fingers. °· Your hand becomes warm to the touch. °· Your contusion does not improve in 2 days. °MAKE SURE YOU:  °· Understand these instructions. °· Will watch your condition. °· Will get help right away if you are not doing well or get worse. °Document Released: 12/08/2001 Document Revised: 03/12/2012 Document Reviewed: 12/10/2011 °[employer]® Patient Information ©2014 [employer]. ° °

## 2013-10-05 NOTE — ED Notes (Signed)
Pt reports that last night her thumb started hurting. Denies any injury to the area. Reports increased pain with movement

## 2013-10-08 NOTE — ED Provider Notes (Signed)
Medical screening examination/treatment/procedure(s) were performed by non-physician practitioner and as supervising physician I was immediately available for consultation/collaboration.   Aldair Rickel Y. Jaking Thayer, MD 10/08/13 1501 

## 2013-10-11 ENCOUNTER — Encounter (HOSPITAL_COMMUNITY): Payer: Self-pay | Admitting: Emergency Medicine

## 2013-10-11 ENCOUNTER — Emergency Department (HOSPITAL_COMMUNITY): Payer: Medicaid Other

## 2013-10-11 ENCOUNTER — Emergency Department (HOSPITAL_COMMUNITY)
Admission: EM | Admit: 2013-10-11 | Discharge: 2013-10-11 | Disposition: A | Discharge status: Home / Self Care | Payer: Medicaid Other | Attending: Emergency Medicine | Admitting: Emergency Medicine

## 2013-10-11 DIAGNOSIS — R51 Headache: Secondary | ICD-10-CM | POA: Insufficient documentation

## 2013-10-11 DIAGNOSIS — B961 Klebsiella pneumoniae [K. pneumoniae] as the cause of diseases classified elsewhere: Secondary | ICD-10-CM | POA: Insufficient documentation

## 2013-10-11 DIAGNOSIS — K208 Other esophagitis without bleeding: Secondary | ICD-10-CM | POA: Insufficient documentation

## 2013-10-11 DIAGNOSIS — K589 Irritable bowel syndrome without diarrhea: Secondary | ICD-10-CM | POA: Insufficient documentation

## 2013-10-11 DIAGNOSIS — IMO0002 Reserved for concepts with insufficient information to code with codable children: Secondary | ICD-10-CM

## 2013-10-11 DIAGNOSIS — J45909 Unspecified asthma, uncomplicated: Secondary | ICD-10-CM | POA: Insufficient documentation

## 2013-10-11 DIAGNOSIS — D649 Anemia, unspecified: Secondary | ICD-10-CM | POA: Insufficient documentation

## 2013-10-11 DIAGNOSIS — K219 Gastro-esophageal reflux disease without esophagitis: Secondary | ICD-10-CM | POA: Insufficient documentation

## 2013-10-11 LAB — CBC WITH DIFFERENTIAL/PLATELET
BASOS ABS: 0 10*3/uL (ref 0.0–0.1)
Basophils Relative: 1 % (ref 0–1)
EOS PCT: 4 % (ref 0–5)
Eosinophils Absolute: 0.3 10*3/uL (ref 0.0–0.7)
HEMATOCRIT: 36.8 % (ref 36.0–46.0)
HEMOGLOBIN: 13 g/dL (ref 12.0–15.0)
LYMPHS PCT: 40 % (ref 12–46)
Lymphs Abs: 3.2 10*3/uL (ref 0.7–4.0)
MCH: 32.7 pg (ref 26.0–34.0)
MCHC: 35.3 g/dL (ref 30.0–36.0)
MCV: 92.5 fL (ref 78.0–100.0)
MONO ABS: 0.7 10*3/uL (ref 0.1–1.0)
MONOS PCT: 8 % (ref 3–12)
Neutro Abs: 3.8 10*3/uL (ref 1.7–7.7)
Neutrophils Relative %: 47 % (ref 43–77)
Platelets: 290 10*3/uL (ref 150–400)
RBC: 3.98 MIL/uL (ref 3.87–5.11)
RDW: 13.2 % (ref 11.5–15.5)
WBC: 7.9 10*3/uL (ref 4.0–10.5)

## 2013-10-11 LAB — BASIC METABOLIC PANEL
BUN: 12 mg/dL (ref 6–23)
CALCIUM: 9.3 mg/dL (ref 8.4–10.5)
CHLORIDE: 106 meq/L (ref 96–112)
CO2: 20 meq/L (ref 19–32)
CREATININE: 0.69 mg/dL (ref 0.50–1.10)
GFR calc Af Amer: >90 mL/min (ref >90)
GFR calc non Af Amer: >90 mL/min (ref >90)
GLUCOSE: 92 mg/dL (ref 70–99)
Potassium: 4.3 mEq/L (ref 3.7–5.3)
Sodium: 138 mEq/L (ref 137–147)

## 2013-10-11 MED ORDER — VANCOMYCIN HCL IN DEXTROSE 1-5 GM/200ML-% IV SOLN
1000.0000 mg | Freq: Once | INTRAVENOUS | Status: AC
  Administered 2013-10-11: 1000 mg via INTRAVENOUS
  Filled 2013-10-11: qty 200

## 2013-10-11 MED ORDER — MORPHINE SULFATE 4 MG/ML IJ SOLN
4.0000 mg | Freq: Once | INTRAMUSCULAR | Status: AC
  Administered 2013-10-11: 4 mg via INTRAVENOUS
  Filled 2013-10-11: qty 1

## 2013-10-11 MED ORDER — OXYCODONE HCL 5 MG PO TABS: 5.0000 mg | ORAL_TABLET | ORAL | Status: DC | PRN

## 2013-10-11 MED ORDER — CLINDAMYCIN HCL 300 MG PO CAPS: 300.0000 mg | ORAL_CAPSULE | Freq: Four times a day (QID) | ORAL | Status: DC

## 2013-10-11 MED ORDER — BUPIVACAINE HCL 0.25 % IJ SOLN
30.0000 mL | Freq: Once | INTRAMUSCULAR | Status: DC
  Filled 2013-10-11: qty 30

## 2013-10-11 MED ORDER — LIDOCAINE HCL 1 % IJ SOLN
10.0000 mL | Freq: Once | INTRAMUSCULAR | Status: AC
  Administered 2013-10-11: 10 mL
  Filled 2013-10-11: qty 10

## 2013-10-11 NOTE — ED Notes (Signed)
MD at bedside. 

## 2013-10-11 NOTE — ED Notes (Signed)
Patient transported to X-ray 

## 2013-10-11 NOTE — ED Notes (Signed)
Patient reports she was recently seen here on April 6th for thumb pain.  She says she was released with a splint, but felt that her thumb was swelling more and getting worse.  She also said her prescription, vicodin, gave her migraines.

## 2013-10-11 NOTE — Discharge Instructions (Signed)
Please keep your bandage clean and Keep bandage clean and dry.  Call for any problems.  No smoking.  Criteria for driving a car: you should be off your pain medicine for 7-8 hours, able to drive one handed(confident), thinking clearly and feeling able in your judgement to drive. Continue elevation as it will decrease swelling.  If instructed by MD move your fingers within the confines of the bandage/splint.  Use ice if instructed by your MD. Call immediately for any sudden loss of feeling in your hand/arm or change in functional abilities of the extremity.   We recommend that you to take vitamin C 1000 mg a day to promote healing we also recommend that if you require her pain medicine that he take a stool softener to prevent constipation as most pain medicines will have constipation side effects. We recommend either Peri-Colace or Senokot and recommend that you also consider adding MiraLAX to prevent the constipation affects from pain medicine if you are required to use them. These medicines are over the counter and maybe purchased at a local pharmacy.

## 2013-10-11 NOTE — Consult Note (Signed)
  See Dictation #[id number] Amanda PeaGramig Md

## 2013-10-11 NOTE — ED Provider Notes (Signed)
CSN: [id number]     Arrival date & time 10/11/13  0551 History   First MD Initiated Contact with Patient 10/11/13 0559     Chief Complaint  Patient presents with  . Hand Pain     (Consider location/radiation/quality/duration/timing/severity/associated sxs/prior Treatment) HPI Patient reports 12 days ago she was walking and she accidentally hit her right thumb on something as she was walking by. She was seen in the ED on April 6 for that injury and had a normal x-ray. She reports she would squeeze on it and it made it feel better. She was placed in a splint and she reports one week ago she was making Easter dinner which is like making a Thanksgiving dinner. She cooked all day including picking up heavy items such as ham. She reports the following day she started having increased swelling and pain in the same right thumb. Patient is right-handed. She denies any fever or chills. She has artificial nails and states she went to her nail specialist yesterday and had them remove the nail. They thought she had developed an infection in her thumb. She denies any other known trauma to her thumb. She has not had this problem before. She reports constant pain and is able to sleep the last couple of days.  PCP Dr Garald BraverKennerly  Past Medical History  Diagnosis Date  . GERD (gastroesophageal reflux disease)   . Erosive esophagitis     distal esophagus, with low grade bleed 03/2009  . IBS (irritable bowel syndrome)   . Anemia   . History of migraine headaches   . Asthma   . WUJWJXBJ(478.2Headache(784.0)    Past Surgical History  Procedure Laterality Date  . No past surgeries     Family History  Problem Relation Age of Onset  . Heart disease Mother   . Migraines Mother   . Migraines Father   . Asthma Daughter    History  Substance Use Topics  . Smoking status: Never Smoker   . Smokeless tobacco: Never Used  . Alcohol Use: Yes     Comment: occasional on holidays   employed  OB History   Grav Para Term  Preterm Abortions TAB SAB Ect Mult Living                 Review of Systems  All other systems reviewed and are negative.     Allergies  Ibuprofen  Home Medications   Current Outpatient Rx  Name  Route  Sig  Dispense  Refill  . albuterol (PROVENTIL HFA;VENTOLIN HFA) 108 (90 BASE) MCG/ACT inhaler   Inhalation   Inhale 2 puffs into the lungs every 6 (six) hours as needed. For shortness of breath   1 Inhaler   5    BP 130/80  Pulse 81  Temp(Src) 98.3 F (36.8 C) (Oral)  Resp 16  SpO2 100%  LMP 09/18/2013  Vital signs normal   Physical Exam  Nursing note and vitals reviewed. Constitutional: She is oriented to person, place, and time. She appears well-developed and well-nourished.  Non-toxic appearance. She does not appear ill. No distress.  HENT:  Head: Normocephalic and atraumatic.  Right Ear: External ear normal.  Left Ear: External ear normal.  Nose: Nose normal. No mucosal edema or rhinorrhea.  Mouth/Throat: Mucous membranes are normal. No dental abscesses or uvula swelling.  Eyes: Conjunctivae and EOM are normal. Pupils are equal, round, and reactive to light.  Neck: Normal range of motion and full passive range of motion without pain.  Neck supple.  Pulmonary/Chest: Effort normal. No respiratory distress. She has no rhonchi. She exhibits no crepitus.  Abdominal: Soft. Normal appearance and bowel sounds are normal.  Musculoskeletal: Normal range of motion. She exhibits edema and tenderness.  Patient has diffuse swelling of her right palm compared to her (she is tender to palpation diffusely but seems very tender to palpation over the volar aspect of her thumb. She has difficulty flexing her thumb. There is no collection of pus around her cuticle to suggest paronychia. There is no ridge streaking going up her hand or arm. There is warmth to the thumb. There is no pus underneath the nail or so although hematoma. She has artificial nails on all of her fingers except the  thumb. She relates she had that nail removed yesterday by her nail specialist.  See photos  Neurological: She is alert and oriented to person, place, and time. She has normal strength. No cranial nerve deficit.  Skin: Skin is warm, dry and intact. No rash noted. No erythema. No pallor.  Psychiatric: She has a normal mood and affect. Her speech is normal and behavior is normal. Her mood appears not anxious.              ED Course  Procedures (including critical care time)  Medications  bupivacaine (MARCAINE) 0.25 % (with pres) injection 30 mL (30 mLs Infiltration Not Given 10/11/13 1049)  morphine 4 MG/ML injection 4 mg (4 mg Intravenous Given 10/11/13 0658)  lidocaine (XYLOCAINE) 1 % (with pres) injection 10 mL (10 mLs Infiltration Given 10/11/13 0801)  morphine 4 MG/ML injection 4 mg (4 mg Intravenous Given 10/11/13 0801)  vancomycin (VANCOCIN) IVPB 1000 mg/200 mL premix (0 mg Intravenous Stopped 10/11/13 1351)    07:24 Dr Amanda Pea will see patient in the ED between OR cases, have 1% lidocaine, suture cart and hand tray at bedside. Wait on antibiotics so he can send culture. Keep NPO. Pt informed.     Labs Review Results for orders placed during the hospital encounter of 10/11/13  CBC WITH DIFFERENTIAL      Result Value Ref Range   WBC 7.9  4.0 - 10.5 K/uL   RBC 3.98  3.87 - 5.11 MIL/uL   Hemoglobin 13.0  12.0 - 15.0 g/dL   HCT 16.1  09.6 - 04.5 %   MCV 92.5  78.0 - 100.0 fL   MCH 32.7  26.0 - 34.0 pg   MCHC 35.3  30.0 - 36.0 g/dL   RDW 40.9  81.1 - 91.4 %   Platelets 290  150 - 400 K/uL   Neutrophils Relative % 47  43 - 77 %   Neutro Abs 3.8  1.7 - 7.7 K/uL   Lymphocytes Relative 40  12 - 46 %   Lymphs Abs 3.2  0.7 - 4.0 K/uL   Monocytes Relative 8  3 - 12 %   Monocytes Absolute 0.7  0.1 - 1.0 K/uL   Eosinophils Relative 4  0 - 5 %   Eosinophils Absolute 0.3  0.0 - 0.7 K/uL   Basophils Relative 1  0 - 1 %   Basophils Absolute 0.0  0.0 - 0.1 K/uL  BASIC METABOLIC PANEL       Result Value Ref Range   Sodium 138  137 - 147 mEq/L   Potassium 4.3  3.7 - 5.3 mEq/L   Chloride 106  96 - 112 mEq/L   CO2 20  19 - 32 mEq/L   Glucose, Bld 92  70 - 99 mg/dL   BUN 12  6 - 23 mg/dL   Creatinine, Ser 1.61  0.50 - 1.10 mg/dL   Calcium 9.3  8.4 - 09.6 mg/dL   GFR calc non Af Amer >90  >90 mL/min   GFR calc Af Amer >90  >90 mL/min   Laboratory interpretation all normal  Imaging Review Dg Finger Thumb Right  10/11/2013   CLINICAL DATA:  Right thumb swelling for 7 days. Hit thumb 2 weeks ago.  EXAM: RIGHT THUMB 2+V  COMPARISON:  10/05/2013  FINDINGS: There is no evidence of fracture or dislocation. Joint spaces are maintained. No focal bony abnormality identified. Suggestion of subcutaneous edema adjacent to the first metacarpal.  IMPRESSION: Probable subcutaneous edema adjacent to the first metacarpal. No acute bony abnormality.   Electronically Signed   By: Britta Mccreedy M.D.   On: 10/11/2013 08:17      EKG Interpretation None      MDM   Final diagnoses:  Felon    Discharged by Dr Jetta Lout, MD, Franz Dell, MD 10/11/13 [phone]

## 2013-10-11 NOTE — Discharge Summary (Signed)
  Please see my full consult and the ER report  Omar Orrego M.D.

## 2013-10-11 NOTE — ED Notes (Signed)
Dr gramig at bedside to eval pt 

## 2013-10-12 NOTE — Consult Note (Signed)
NAMEYARIS, KIBE NO.:  [id number]  MEDICAL RECORD NO.:  [id number]  LOCATION:  A09C                         FACILITY:  [facility]  PHYSICIAN:  Dionne Ano. Alaynah Schutter, M.D.DATE OF BIRTH:  07/07/1980  DATE OF CONSULTATION: DATE OF DISCHARGE:  10/11/2013                                CONSULTATION   I had the pleasure to see [name] upon referral from Dr. Lynelle Doctor and associates in the [facility] Emergency Room.  This patient is a 33- year-old female who works at [employer] and noticed the insidious onset of pain in her thumb after hitting it a week ago.  Her notes and records will reflect that she came to the ER after she hit the thumb.  This was evaluated.  X-rays were negative.  She went home and has slowly had the insidious onset of pain, worsening, swelling, and inability to use the thumb.  She states that she denies any other injury except for a hematoma-type injury.  I have reviewed this with her at length and the findings.  She notes no other issues.  She denies locking, popping, catching in the fingers.  She denies pain over the A1 pulley region.  I have reviewed this with her at length and the findings.  At present juncture, she has a painful swollen right thumb.  Her family member is with her.  I have discussed her all issues.  I performed a thorough chart review.  She cannot take NSAID products due to erosive esophagitis history.  She has a history of anemia, IBS, migraine headaches, asthma, and headaches.  PAST SURGICAL HISTORY:  Reviewed.  She is noted to have no past surgery history.  SOCIAL HISTORY:  She does not smoke.  She only rarely drinks.  She does not use any type of drugs.  MEDICINES:  Reviewed.  ALLERGIES:  Ibuprofen only.  The patient and I have discussed this at length and her findings.  She has a physical examination which shows artificial nails that extend at least 2 inches past the pulp tissue.  She states she has  actually cut them back.  She states she generally enjoys having very, very long nails and does not have to do much typing activities at work.  She has swelling, erythema, and abnormalities about the right thumb.  There is an obvious area of pointing where I feel she has an abscess.  FPL and EPL are intact, but she is exquisitely painful.  Upper extremity is neurovascularly intact.  Normal arm swing and range of motion.  Similar nail appearance. CHEST:  Clear. HEENT:  Within normal limits. ABDOMEN:  Nontender, nondistended. LOWER EXTREMITIES:  Benign without signs of DVT, infection, or other problems. PELVIS:  Stable.  X-RAYS:  Negative for fracture, dislocation, __________ lesion on AP lateral views.  I reviewed this with her at length and the findings.  IMPRESSION:  Abscess, right thumb.  RECOMMENDATIONS:  I have discussed __________ I would recommend I and D and repair as necessary.  I have consented for irrigation, debridement of skin and subcutaneous tissue, nail removal and debridement.  PROCEDURE NOTE:  She was taken to procedure suite.  Following this, she was prepped.  Verbally, consent was obtained in written form.  Following this, I obtained a mixture of 5 mL of lidocaine and 5 mL of 0.5% Sensorcaine without epinephrine.  These were placed in two 10 mL syringes.  She was then prepped and draped with alcohol and Betadine. Following this, we then very carefully performed intermetacarpal/field block.  I should note that in performing the field block, the syringe broke in half and this did scratch my thumb tip.  It was difficult to ascertain whether this scratch was from the needle or from the broken portions and it was very minor, but at the same time, we did go ahead and lap her. She had no history of bloodborne pathogens.  I reported this.  Unfortunately, this appeared to be a very defective apparatus in terms of the syringe.  Following this, she was prepped and  draped.  Two separate surgical scrubs, use of  Betadine scrub and paint x2.  Following this, I very carefully lifted up the nail fold and purulence __________.  This was very foul smelling, which was noted by herself and myself.  This was cultured for aerobic and anaerobic cultures.  Following this, I then removed the nail plate in its entirety.  This was done with Therapist, nutritional.  Following removal of the nail plate, we then performed I and D of skin, subcutaneous tissue, and periosteal tissue.  This was an excisional debridement with knife, blade, curette, and scissors.  The patient tolerated this well.  There were no complicating features.  Once this was done, the patient then underwent very careful and cautious irrigation.  I lavaged the area copiously.  I did not see any evidence of osteomyelitis.  Once this done; I then packed the wound with gauze, Xeroform, and following this, performed very careful and cautious placement of a sterile dressing.  The patient tolerated this well.  I am going to place her on clindamycin p.o. and form of antibiotics and give her Percocet for pain.  I am going to see her back in the office tomorrow.  I have discussed the relevant issues, do's and don'ts. Should any problems arise, she will notify me.  The patient had culture sent.  She tolerated the procedure well.  There were no issues.  We are going to monitor her condition carefully and cautiously.  Fortunately, there is no osteomyelitis at this present time, but she did have quite a bit of purulence which was completely impressing.  It was a pleasure to see her in consultation __________.     Dionne Ano. Amanda Pea, M.D.    [facility]  D:  10/11/2013  T:  10/11/2013  Job:  [id number]

## 2013-10-14 LAB — CULTURE, ROUTINE-ABSCESS

## 2013-10-15 NOTE — Progress Notes (Signed)
ED Antimicrobial Stewardship Positive Culture Follow Up   [name] is an 33 y.o. female who [facility] with a chief complaint of  Chief Complaint  Patient presents with  . Hand Pain    Recent Results (from the past 720 hour(s))  CULTURE, ROUTINE-ABSCESS     Status: None   Collection Time    10/11/13 12:25 PM      Result Value Ref Range Status   Specimen Description ABSCESS RIGHT THUMB   Final   Special Requests NONE   Final   Gram Stain     Final   Value: ABUNDANT WBC PRESENT, PREDOMINANTLY PMN     RARE SQUAMOUS EPITHELIAL CELLS PRESENT     ABUNDANT GRAM POSITIVE COCCI IN PAIRS     IN CLUSTERS IN CHAINS MODERATE GRAM NEGATIVE RODS     Performed at [facility]   Culture     Final   Value: FEW KLEBSIELLA PNEUMONIAE     Performed at [facility]   Report Status 10/14/2013 FINAL   Final   Organism ID, Bacteria KLEBSIELLA PNEUMONIAE   Final  ANAEROBIC CULTURE     Status: None   Collection Time    10/11/13 12:25 PM      Result Value Ref Range Status   Specimen Description ABSCESS RIGHT THUMB   Final   Special Requests NONE   Final   Gram Stain     Final   Value: ABUNDANT WBC PRESENT, PREDOMINANTLY PMN     RARE SQUAMOUS EPITHELIAL CELLS PRESENT     ABUNDANT GRAM POSITIVE COCCI IN PAIRS     IN CLUSTERS IN CHAINS MODERATE GRAM NEGATIVE RODS     Performed at [facility]   Culture     Final   Value: NO ANAEROBES ISOLATED; CULTURE IN PROGRESS FOR 5 DAYS     Performed at [facility]   Report Status PENDING   Incomplete    [x]  Treated with clindamycin, organism resistant to prescribed antimicrobial  New antibiotic prescription: Keflex PO 500mg  TID for 10 days  ED Provider: Emilia BeckKaitlyn Szekalski, PA-C  [name] FlakeNathan E. Achilles Archer, PharmD Clinical Pharmacist - Resident Pager: [phone] Pharmacy: [phone] 10/15/2013 4:40 PM

## 2013-10-15 NOTE — ED Notes (Signed)
+   Culture Rx for Keflex 500 mg TID x 10 days per  [facility]

## 2013-10-16 LAB — ANAEROBIC CULTURE

## 2013-10-17 ENCOUNTER — Telehealth (HOSPITAL_BASED_OUTPATIENT_CLINIC_OR_DEPARTMENT_OTHER): Payer: Self-pay | Admitting: Emergency Medicine

## 2013-10-25 NOTE — Telephone Encounter (Signed)
Unable to contact patient via phone. Sent letter. °

## 2013-10-28 ENCOUNTER — Telehealth (HOSPITAL_BASED_OUTPATIENT_CLINIC_OR_DEPARTMENT_OTHER): Payer: Self-pay

## 2013-10-28 NOTE — Telephone Encounter (Signed)
Pt informed of dx and need for addl tx.  Rx called to [facility] [phone] and left on VM.

## 2013-11-02 ENCOUNTER — Encounter (HOSPITAL_COMMUNITY): Payer: Self-pay | Admitting: Emergency Medicine

## 2013-11-02 ENCOUNTER — Emergency Department (HOSPITAL_COMMUNITY): Payer: Medicaid Other

## 2013-11-02 ENCOUNTER — Emergency Department (HOSPITAL_COMMUNITY)
Admission: EM | Admit: 2013-11-02 | Discharge: 2013-11-02 | Disposition: A | Discharge status: Home / Self Care | Payer: Medicaid Other | Attending: Emergency Medicine | Admitting: Emergency Medicine

## 2013-11-02 DIAGNOSIS — R110 Nausea: Secondary | ICD-10-CM | POA: Insufficient documentation

## 2013-11-02 DIAGNOSIS — Z8719 Personal history of other diseases of the digestive system: Secondary | ICD-10-CM | POA: Insufficient documentation

## 2013-11-02 DIAGNOSIS — J45909 Unspecified asthma, uncomplicated: Secondary | ICD-10-CM | POA: Insufficient documentation

## 2013-11-02 DIAGNOSIS — O9989 Other specified diseases and conditions complicating pregnancy, childbirth and the puerperium: Secondary | ICD-10-CM | POA: Insufficient documentation

## 2013-11-02 DIAGNOSIS — Z862 Personal history of diseases of the blood and blood-forming organs and certain disorders involving the immune mechanism: Secondary | ICD-10-CM | POA: Insufficient documentation

## 2013-11-02 DIAGNOSIS — O219 Vomiting of pregnancy, unspecified: Secondary | ICD-10-CM | POA: Insufficient documentation

## 2013-11-02 DIAGNOSIS — Z8679 Personal history of other diseases of the circulatory system: Secondary | ICD-10-CM | POA: Insufficient documentation

## 2013-11-02 DIAGNOSIS — R109 Unspecified abdominal pain: Secondary | ICD-10-CM | POA: Insufficient documentation

## 2013-11-02 LAB — URINE MICROSCOPIC-ADD ON

## 2013-11-02 LAB — URINALYSIS, ROUTINE W REFLEX MICROSCOPIC
Bilirubin Urine: NEGATIVE
Glucose, UA: NEGATIVE mg/dL
HGB URINE DIPSTICK: NEGATIVE
KETONES UR: 40 mg/dL — AB
Leukocytes, UA: NEGATIVE
Nitrite: NEGATIVE
PH: 6 (ref 5.0–8.0)
PROTEIN: 30 mg/dL — AB
Specific Gravity, Urine: 1.031 — ABNORMAL HIGH (ref 1.005–1.030)
Urobilinogen, UA: 1 mg/dL (ref 0.0–1.0)

## 2013-11-02 LAB — CBC WITH DIFFERENTIAL/PLATELET
BASOS PCT: 0 % (ref 0–1)
Basophils Absolute: 0 10*3/uL (ref 0.0–0.1)
Eosinophils Absolute: 0.2 10*3/uL (ref 0.0–0.7)
Eosinophils Relative: 2 % (ref 0–5)
HEMATOCRIT: 37.4 % (ref 36.0–46.0)
Hemoglobin: 13.3 g/dL (ref 12.0–15.0)
Lymphocytes Relative: 38 % (ref 12–46)
Lymphs Abs: 3.5 10*3/uL (ref 0.7–4.0)
MCH: 32.4 pg (ref 26.0–34.0)
MCHC: 35.6 g/dL (ref 30.0–36.0)
MCV: 91 fL (ref 78.0–100.0)
MONO ABS: 0.7 10*3/uL (ref 0.1–1.0)
MONOS PCT: 8 % (ref 3–12)
Neutro Abs: 4.8 10*3/uL (ref 1.7–7.7)
Neutrophils Relative %: 52 % (ref 43–77)
Platelets: 323 10*3/uL (ref 150–400)
RBC: 4.11 MIL/uL (ref 3.87–5.11)
RDW: 12.9 % (ref 11.5–15.5)
WBC: 9.2 10*3/uL (ref 4.0–10.5)

## 2013-11-02 LAB — COMPREHENSIVE METABOLIC PANEL
ALK PHOS: 57 U/L (ref 39–117)
ALT: 10 U/L (ref 0–35)
AST: 13 U/L (ref 0–37)
Albumin: 3.9 g/dL (ref 3.5–5.2)
BILIRUBIN TOTAL: 0.6 mg/dL (ref 0.3–1.2)
BUN: 6 mg/dL (ref 6–23)
CO2: 21 meq/L (ref 19–32)
CREATININE: 0.71 mg/dL (ref 0.50–1.10)
Calcium: 9.5 mg/dL (ref 8.4–10.5)
Chloride: 101 mEq/L (ref 96–112)
GFR calc non Af Amer: >90 mL/min (ref >90)
GLUCOSE: 99 mg/dL (ref 70–99)
POTASSIUM: 3.8 meq/L (ref 3.7–5.3)
Sodium: 136 mEq/L — ABNORMAL LOW (ref 137–147)
Total Protein: 7.5 g/dL (ref 6.0–8.3)

## 2013-11-02 LAB — ABO/RH: ABO/RH(D): O POS

## 2013-11-02 LAB — HCG, QUANTITATIVE, PREGNANCY: hCG, Beta Chain, Quant, S: 29771 m[IU]/mL — ABNORMAL HIGH (ref <5)

## 2013-11-02 LAB — PREGNANCY, URINE: Preg Test, Ur: POSITIVE — AB

## 2013-11-02 LAB — LIPASE, BLOOD: Lipase: 17 U/L (ref 11–59)

## 2013-11-02 MED ORDER — FENTANYL CITRATE 0.05 MG/ML IJ SOLN
50.0000 ug | INTRAMUSCULAR | Status: DC | PRN
  Administered 2013-11-02: 50 ug via INTRAVENOUS
  Filled 2013-11-02: qty 2

## 2013-11-02 MED ORDER — ONDANSETRON HCL 4 MG/2ML IJ SOLN
4.0000 mg | Freq: Once | INTRAMUSCULAR | Status: AC
  Administered 2013-11-02: 4 mg via INTRAVENOUS
  Filled 2013-11-02: qty 2

## 2013-11-02 MED ORDER — ONDANSETRON 4 MG PO TBDP
8.0000 mg | ORAL_TABLET | Freq: Once | ORAL | Status: AC
  Administered 2013-11-02: 8 mg via ORAL
  Filled 2013-11-02: qty 2

## 2013-11-02 MED ORDER — SODIUM CHLORIDE 0.9 % IV BOLUS (SEPSIS)
1000.0000 mL | Freq: Once | INTRAVENOUS | Status: AC
  Administered 2013-11-02: 1000 mL via INTRAVENOUS

## 2013-11-02 MED ORDER — ONDANSETRON HCL 4 MG PO TABS: 4.0000 mg | ORAL_TABLET | Freq: Four times a day (QID) | ORAL | Status: DC

## 2013-11-02 NOTE — ED Provider Notes (Signed)
CSN: [id number]     Arrival date & time 11/02/13  0244 History   First MD Initiated Contact with Patient 11/02/13 0408     Chief Complaint  Patient presents with  . Emesis     (Consider location/radiation/quality/duration/timing/severity/associated sxs/prior Treatment) HPI History provided by patient. Nausea and vomiting for the last 5 days, feels dehydrated. This had intermittent lower abdominal cramping mostly midline. No dysuria, urgency or frequency. LMP March 20. Is sexually active. No back pain. No fevers or chills. Symptoms moderate in severity without none alleviating factors. No recent travel. No rashes. No blood in emesis or stools. No diarrhea or constipation. No known sick contacts.  Past Medical History  Diagnosis Date  . GERD (gastroesophageal reflux disease)   . Erosive esophagitis     distal esophagus, with low grade bleed 03/2009  . IBS (irritable bowel syndrome)   . Anemia   . History of migraine headaches   . Asthma   . UVOZDGUY(403.4Headache(784.0)    Past Surgical History  Procedure Laterality Date  . No past surgeries     Family History  Problem Relation Age of Onset  . Heart disease Mother   . Migraines Mother   . Migraines Father   . Asthma Daughter    History  Substance Use Topics  . Smoking status: Never Smoker   . Smokeless tobacco: Never Used  . Alcohol Use: Yes     Comment: occasional on holidays   OB History   Grav Para Term Preterm Abortions TAB SAB Ect Mult Living                 Review of Systems  Constitutional: Negative for fever and chills.  Respiratory: Negative for shortness of breath.   Cardiovascular: Negative for chest pain.  Gastrointestinal: Positive for nausea and vomiting. Negative for abdominal distention.  Genitourinary: Negative for dysuria and difficulty urinating.  Musculoskeletal: Negative for back pain, neck pain and neck stiffness.  Skin: Negative for rash.  Neurological: Negative for headaches.  All other systems reviewed  and are negative.     Allergies  Ibuprofen  Home Medications   Prior to Admission medications   Not on File   BP 112/68  Pulse 62  Temp(Src) 98.1 F (36.7 C) (Oral)  Resp 16  Ht 5\' 6"  (1.676 m)  Wt 172 lb (78.019 kg)  BMI 27.77 kg/m2  SpO2 98%  LMP 10/19/2013 Physical Exam  Constitutional: She is oriented to person, place, and time. She appears well-developed and well-nourished.  HENT:  Head: Normocephalic and atraumatic.  Mouth/Throat: Oropharynx is clear and moist.  Eyes: EOM are normal. Pupils are equal, round, and reactive to light.  Neck: Neck supple.  Cardiovascular: Normal rate, regular rhythm and intact distal pulses.   Pulmonary/Chest: Effort normal and breath sounds normal. No respiratory distress.  Abdominal: Soft. Bowel sounds are normal. She exhibits no distension and no mass. There is no tenderness. There is no rebound and no guarding.  Musculoskeletal: Normal range of motion. She exhibits no edema.  Neurological: She is alert and oriented to person, place, and time.  Skin: Skin is warm and dry.    ED Course  Procedures (including critical care time) Labs Review Labs Reviewed  URINALYSIS, ROUTINE W REFLEX MICROSCOPIC - Abnormal; Notable for the following:    Color, Urine AMBER (*)    Specific Gravity, Urine 1.031 (*)    Ketones, ur 40 (*)    Protein, ur 30 (*)    All other components within  normal limits  PREGNANCY, URINE - Abnormal; Notable for the following:    Preg Test, Ur POSITIVE (*)    All other components within normal limits  COMPREHENSIVE METABOLIC PANEL - Abnormal; Notable for the following:    Sodium 136 (*)    All other components within normal limits  URINE MICROSCOPIC-ADD ON - Abnormal; Notable for the following:    Squamous Epithelial / LPF FEW (*)    Bacteria, UA FEW (*)    All other components within normal limits  CBC WITH DIFFERENTIAL  LIPASE, BLOOD  HCG, QUANTITATIVE, PREGNANCY  ABO/RH    Imaging Review [place] Ob Comp  Less 14 Wks  11/02/2013   CLINICAL DATA:  Pregnant patient with abdominal pain and nausea.  EXAM: OBSTETRIC <14 WK US AND TRANSVAGINAL OB US  TECHNIQUE: Both transabdominal and transvaginal ultrasound examinations were performed for complete evaluation of the gestation as well as the maternal uterus, adnexal regions, and pelvic cul-de-sac. Transvaginal technique was performed to assess early pregnancy.  COMPARISON:  None.  FINDINGS: Intrauterine gestational sac: Visualized/normal in shape. A very small subchorionic hemorrhage is seen.  Yolk sac:  Visualized.  Embryo:  Visualized.  Cardiac Activity: Detected.  Heart Rate:  97 bpm  CRL:   0.64  mm   6 w 3 d                  US EDC: 06/25/2014.  Maternal uterus/adnexae: Unremarkable.  IMPRESSION: Single living injury pregnancy. A very small subchorionic hemorrhage is present.   Electronically Signed   By: Drusilla Kannerhomas  Dalessio M.D.   On: 11/02/2013 07:19   [place] Ob Transvaginal  11/02/2013   CLINICAL DATA:  Pregnant patient with abdominal pain and nausea.  EXAM: OBSTETRIC <14 WK US AND TRANSVAGINAL OB US  TECHNIQUE: Both transabdominal and transvaginal ultrasound examinations were performed for complete evaluation of the gestation as well as the maternal uterus, adnexal regions, and pelvic cul-de-sac. Transvaginal technique was performed to assess early pregnancy.  COMPARISON:  None.  FINDINGS: Intrauterine gestational sac: Visualized/normal in shape. A very small subchorionic hemorrhage is seen.  Yolk sac:  Visualized.  Embryo:  Visualized.  Cardiac Activity: Detected.  Heart Rate:  97 bpm  CRL:   0.64  mm   6 w 3 d                  US EDC: 06/25/2014.  Maternal uterus/adnexae: Unremarkable.  IMPRESSION: Single living injury pregnancy. A very small subchorionic hemorrhage is present.   Electronically Signed   By: Drusilla Kannerhomas  Dalessio M.D.   On: 11/02/2013 07:19   IV fluids. IV Zofran. Patient requesting pain medication IV fentanyl provided.   On recheck symptomatically  improved. Ultrasound results reviewed with patient as above. Plan discharge home with prescription for Zofran, prenatal vitamins and followup with GYN. All questions answered and patient agrees to all discharge and followup instructions.   MDM   Diagnosis: Vomiting, early pregnancy  Evaluated with labs, urinalysis and ultrasound obtained and reviewed as above. No ectopic pregnancy. Symptomatically improved with IV fluids and medications. Vital signs and nursing notes reviewed and considered. Patient stable and appropriate for discharge at this time without indication for admission or further workup.  Sunnie NielsenBrian Ashleynicole Mcclees, MD 11/02/13 [phone]

## 2013-11-02 NOTE — ED Notes (Signed)
Patient transported to Ultrasound 

## 2013-11-02 NOTE — ED Notes (Signed)
Pt cannot  Void at present

## 2013-11-02 NOTE — Discharge Instructions (Signed)
Morning Sickness Rest, drink plenty of fluids and start prenatal vitamins as discussed.   Morning sickness is when you feel sick to your stomach (nauseous) during pregnancy. This nauseous feeling may or may not come with vomiting. It often occurs in the morning but can be a problem any time of day. Morning sickness is most common during the first trimester, but it may continue throughout pregnancy. While morning sickness is unpleasant, it is usually harmless unless you develop severe and continual vomiting (hyperemesis gravidarum). This condition requires more intense treatment.  CAUSES  The cause of morning sickness is not completely known but seems to be related to normal hormonal changes that occur in pregnancy. RISK FACTORS You are at greater risk if you:  Experienced nausea or vomiting before your pregnancy.  Had morning sickness during a previous pregnancy.  Are pregnant with more than one baby, such as twins. TREATMENT  Do not use any medicines (prescription, over-the-counter, or herbal) for morning sickness without first talking to your health care provider. Your health care provider may prescribe or recommend:  Vitamin B6 supplements.  Anti-nausea medicines.  The herbal medicine ginger. HOME CARE INSTRUCTIONS   Only take over-the-counter or prescription medicines as directed by your health care provider.  Taking multivitamins before getting pregnant can prevent or decrease the severity of morning sickness in most women.   Eat a piece of dry toast or unsalted crackers before getting out of bed in the morning.   Eat five or six small meals a day.   Eat dry and bland foods (rice, baked potato). Foods high in carbohydrates are often helpful.  Do not drink liquids with your meals. Drink liquids between meals.   Avoid greasy, fatty, and spicy foods.   Get someone to cook for you if the smell of any food causes nausea and vomiting.   If you feel nauseous after taking  prenatal vitamins, take the vitamins at night or with a snack.  Snack on protein foods (nuts, yogurt, cheese) between meals if you are hungry.   Eat unsweetened gelatins for desserts.   Wearing an acupressure wristband (worn for sea sickness) may be helpful.   Acupuncture may be helpful.   Do not smoke.   Get a humidifier to keep the air in your house free of odors.   Get plenty of fresh air. SEEK MEDICAL CARE IF:   Your home remedies are not working, and you need medicine.  You feel dizzy or lightheaded.  You are losing weight. SEEK IMMEDIATE MEDICAL CARE IF:   You have persistent and uncontrolled nausea and vomiting.  You pass out (faint). Document Released: 08/09/2006 Document Revised: 02/18/2013 Document Reviewed: 12/03/2012 [facility] CenterExitCare Patient Information 2014 [place]ExitCare, [place].

## 2013-11-02 NOTE — ED Notes (Signed)
The pt has had nv no diarrhea since Monday.  Mild abd cramps.  lmp march 20th

## 2013-11-03 ENCOUNTER — Ambulatory Visit (INDEPENDENT_AMBULATORY_CARE_PROVIDER_SITE_OTHER): Payer: Medicaid Other | Admitting: Family Medicine

## 2013-11-03 ENCOUNTER — Encounter: Payer: Self-pay | Admitting: Family Medicine

## 2013-11-03 VITALS — BP 121/86 | HR 92 | Temp 97.6°F | Wt 167.7 lb

## 2013-11-03 DIAGNOSIS — Z23 Encounter for immunization: Secondary | ICD-10-CM

## 2013-11-03 DIAGNOSIS — Z3490 Encounter for supervision of normal pregnancy, unspecified, unspecified trimester: Secondary | ICD-10-CM

## 2013-11-03 DIAGNOSIS — J45909 Unspecified asthma, uncomplicated: Secondary | ICD-10-CM | POA: Insufficient documentation

## 2013-11-03 DIAGNOSIS — O99519 Diseases of the respiratory system complicating pregnancy, unspecified trimester: Secondary | ICD-10-CM

## 2013-11-03 DIAGNOSIS — O210 Mild hyperemesis gravidarum: Secondary | ICD-10-CM

## 2013-11-03 DIAGNOSIS — J4520 Mild intermittent asthma, uncomplicated: Secondary | ICD-10-CM

## 2013-11-03 DIAGNOSIS — O99891 Other specified diseases and conditions complicating pregnancy: Secondary | ICD-10-CM

## 2013-11-03 DIAGNOSIS — O9989 Other specified diseases and conditions complicating pregnancy, childbirth and the puerperium: Secondary | ICD-10-CM

## 2013-11-03 DIAGNOSIS — Z3480 Encounter for supervision of other normal pregnancy, unspecified trimester: Secondary | ICD-10-CM

## 2013-11-03 LAB — POCT URINALYSIS DIP (DEVICE)
Bilirubin Urine: NEGATIVE
Glucose, UA: NEGATIVE mg/dL
HGB URINE DIPSTICK: NEGATIVE
Ketones, ur: 40 mg/dL — AB
Leukocytes, UA: NEGATIVE
Nitrite: NEGATIVE
PROTEIN: NEGATIVE mg/dL
SPECIFIC GRAVITY, URINE: 1.02 (ref 1.005–1.030)
UROBILINOGEN UA: 0.2 mg/dL (ref 0.0–1.0)
pH: 7 (ref 5.0–8.0)

## 2013-11-03 MED ORDER — ONDANSETRON HCL 4 MG PO TABS: 4.0000 mg | ORAL_TABLET | Freq: Four times a day (QID) | ORAL | Status: DC

## 2013-11-03 MED ORDER — PROMETHAZINE HCL 25 MG PO TABS: 25.0000 mg | ORAL_TABLET | Freq: Four times a day (QID) | ORAL | Status: DC | PRN

## 2013-11-03 MED ORDER — PROMETHAZINE HCL 25 MG RE SUPP: 25.0000 mg | Freq: Four times a day (QID) | RECTAL | Status: DC | PRN

## 2013-11-03 NOTE — Progress Notes (Signed)
Initial prenatal visit, pt reports extreme nausea
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
## 2013-11-03 NOTE — Patient Instructions (Signed)
Pregnancy - First Trimester  During sexual intercourse, millions of sperm go into the vagina. Only 1 sperm will penetrate and fertilize the female egg while it is in the Fallopian tube. One week later, the fertilized egg implants into the wall of the uterus. An embryo begins to develop into a baby. At 6 to 8 weeks, the eyes and face are formed and the heartbeat can be seen on ultrasound. At the end of 12 weeks (first trimester), all the baby's organs are formed. Now that you are pregnant, you will want to do everything you can to have a healthy baby. Two of the most important things are to get good prenatal care and follow your caregiver's instructions. Prenatal care is all the medical care you receive before the baby's birth. It is given to prevent, find, and treat problems during the pregnancy and childbirth.  PRENATAL EXAMS  · During prenatal visits, your weight, blood pressure, and urine are checked. This is done to make sure you are healthy and progressing normally during the pregnancy.  · A pregnant woman should gain 25 to 35 pounds during the pregnancy. However, if you are overweight or underweight, your caregiver will advise you regarding your weight.  · Your caregiver will ask and answer questions for you.  · Blood work, cervical cultures, other necessary tests, and a Pap test are done during your prenatal exams. These tests are done to check on your health and the probable health of your baby. Tests are strongly recommended and done for HIV with your permission. This is the virus that causes AIDS. These tests are done because medicines can be given to help prevent your baby from being born with this infection should you have been infected without knowing it. Blood work is also used to find out your blood type, previous infections, and follow your blood levels (hemoglobin).  · Low hemoglobin (anemia) is common during pregnancy. Iron and vitamins are given to help prevent this. Later in the pregnancy, blood  tests for diabetes will be done along with any other tests if any problems develop.  · You may need other tests to make sure you and the baby are doing well.  CHANGES DURING THE FIRST TRIMESTER   Your body goes through many changes during pregnancy. They vary from person to person. Talk to your caregiver about changes you notice and are concerned about. Changes can include:  · Your menstrual period stops.  · The egg and sperm carry the genes that determine what you look like. Genes from you and your partner are forming a baby. The female genes determine whether the baby is a boy or a girl.  · Your body increases in girth and you may feel bloated.  · Feeling sick to your stomach (nauseous) and throwing up (vomiting). If the vomiting is uncontrollable, call your caregiver.  · Your breasts will begin to enlarge and become tender.  · Your nipples may stick out more and become darker.  · The need to urinate more. Painful urination may mean you have a bladder infection.  · Tiring easily.  · Loss of appetite.  · Cravings for certain kinds of food.  · At first, you may gain or lose a couple of pounds.  · You may have changes in your emotions from day to day (excited to be pregnant or concerned something may go wrong with the pregnancy and baby).  · You may have more vivid and strange dreams.  HOME CARE INSTRUCTIONS   ·   It is very important to avoid all smoking, alcohol and non-prescribed drugs during your pregnancy. These affect the formation and growth of the baby. Avoid chemicals while pregnant to ensure the delivery of a healthy infant.  · Start your prenatal visits by the 12th week of pregnancy. They are usually scheduled monthly at first, then more often in the last 2 months before delivery. Keep your caregiver's appointments. Follow your caregiver's instructions regarding medicine use, blood and lab tests, exercise, and diet.  · During pregnancy, you are providing food for you and your baby. Eat regular, well-balanced  meals. Choose foods such as meat, fish, milk and other low fat dairy products, vegetables, fruits, and whole-grain breads and cereals. Your caregiver will tell you of the ideal weight gain.  · You can help morning sickness by keeping soda crackers at the bedside. Eat a couple before arising in the morning. You may want to use the crackers without salt on them.  · Eating 4 to 5 small meals rather than 3 large meals a day also may help the nausea and vomiting.  · Drinking liquids between meals instead of during meals also seems to help nausea and vomiting.  · A physical sexual relationship may be continued throughout pregnancy if there are no other problems. Problems may be early (premature) leaking of amniotic fluid from the membranes, vaginal bleeding, or belly (abdominal) pain.  · Exercise regularly if there are no restrictions. Check with your caregiver or physical therapist if you are unsure of the safety of some of your exercises. Greater weight gain will occur in the last 2 trimesters of pregnancy. Exercising will help:  · Control your weight.  · Keep you in shape.  · Prepare you for labor and delivery.  · Help you lose your pregnancy weight after you deliver your baby.  · Wear a good support or jogging bra for breast tenderness during pregnancy. This may help if worn during sleep too.  · Ask when prenatal classes are available. Begin classes when they are offered.  · Do not use hot tubs, steam rooms, or saunas.  · Wear your seat belt when driving. This protects you and your baby if you are in an accident.  · Avoid raw meat, uncooked cheese, cat litter boxes, and soil used by cats throughout the pregnancy. These carry germs that can cause birth defects in the baby.  · The first trimester is a good time to visit your dentist for your dental health. Getting your teeth cleaned is okay. Use a softer toothbrush and brush gently during pregnancy.  · Ask for help if you have financial, counseling, or nutritional needs  during pregnancy. Your caregiver will be able to offer counseling for these needs as well as refer you for other special needs.  · Do not take any medicines or herbs unless told by your caregiver.  · Inform your caregiver if there is any mental or physical domestic violence.  · Make a list of emergency phone numbers of family, friends, hospital, and police and fire departments.  · Write down your questions. Take them to your prenatal visit.  · Do not douche.  · Do not cross your legs.  · If you have to stand for long periods of time, rotate you feet or take small steps in a circle.  · You may have more vaginal secretions that may require a sanitary pad. Do not use tampons or scented sanitary pads.  MEDICINES AND DRUG USE IN PREGNANCY  ·   Take prenatal vitamins as directed. The vitamin should contain 1 milligram of folic acid. Keep all vitamins out of reach of children. Only a couple vitamins or tablets containing iron may be fatal to a baby or young child when ingested.  · Avoid use of all medicines, including herbs, over-the-counter medicines, not prescribed or suggested by your caregiver. Only take over-the-counter or prescription medicines for pain, discomfort, or fever as directed by your caregiver. Do not use aspirin, ibuprofen, or naproxen unless directed by your caregiver.  · Let your caregiver also know about herbs you may be using.  · Alcohol is related to a number of birth defects. This includes fetal alcohol syndrome. All alcohol, in any form, should be avoided completely. Smoking will cause low birth rate and premature babies.  · Street or illegal drugs are very harmful to the baby. They are absolutely forbidden. A baby born to an addicted mother will be addicted at birth. The baby will go through the same withdrawal an adult does.  · Let your caregiver know about any medicines that you have to take and for what reason you take them.  SEEK MEDICAL CARE IF:   You have any concerns or worries during your  pregnancy. It is better to call with your questions if you feel they cannot wait, rather than worry about them.  SEEK IMMEDIATE MEDICAL CARE IF:   · An unexplained oral temperature above 102° F (38.9° C) develops, or as your caregiver suggests.  · You have leaking of fluid from the vagina (birth canal). If leaking membranes are suspected, take your temperature and inform your caregiver of this when you call.  · There is vaginal spotting or bleeding. Notify your caregiver of the amount and how many pads are used.  · You develop a bad smelling vaginal discharge with a change in the color.  · You continue to feel sick to your stomach (nauseated) and have no relief from remedies suggested. You vomit blood or coffee ground-like materials.  · You lose more than 2 pounds of weight in 1 week.  · You gain more than 2 pounds of weight in 1 week and you notice swelling of your face, hands, feet, or legs.  · You gain 5 pounds or more in 1 week (even if you do not have swelling of your hands, face, legs, or feet).  · You get exposed to German measles and have never had them.  · You are exposed to fifth disease or chickenpox.  · You develop belly (abdominal) pain. Round ligament discomfort is a common non-cancerous (benign) cause of abdominal pain in pregnancy. Your caregiver still must evaluate this.  · You develop headache, fever, diarrhea, pain with urination, or shortness of breath.  · You fall or are in a car accident or have any kind of trauma.  · There is mental or physical violence in your home.  Document Released: 06/12/2001 Document Revised: 03/12/2012 Document Reviewed: 12/14/2008  Exit[employer]® Patient Information ©2014 [employer].

## 2013-11-04 LAB — OBSTETRIC PANEL
Antibody Screen: NEGATIVE
Basophils Absolute: 0 10*3/uL (ref 0.0–0.1)
Basophils Relative: 0 % (ref 0–1)
EOS ABS: 0.1 10*3/uL (ref 0.0–0.7)
EOS PCT: 1 % (ref 0–5)
HEMATOCRIT: 37.4 % (ref 36.0–46.0)
HEMOGLOBIN: 13.1 g/dL (ref 12.0–15.0)
HEP B S AG: NEGATIVE
LYMPHS ABS: 2.9 10*3/uL (ref 0.7–4.0)
Lymphocytes Relative: 37 % (ref 12–46)
MCH: 31.4 pg (ref 26.0–34.0)
MCHC: 35 g/dL (ref 30.0–36.0)
MCV: 89.7 fL (ref 78.0–100.0)
MONO ABS: 0.8 10*3/uL (ref 0.1–1.0)
Monocytes Relative: 10 % (ref 3–12)
Neutro Abs: 4.1 10*3/uL (ref 1.7–7.7)
Neutrophils Relative %: 52 % (ref 43–77)
Platelets: 324 10*3/uL (ref 150–400)
RBC: 4.17 MIL/uL (ref 3.87–5.11)
RDW: 12.9 % (ref 11.5–15.5)
Rh Type: POSITIVE
Rubella: 3.82 Index — ABNORMAL HIGH (ref <0.90)
WBC: 7.8 10*3/uL (ref 4.0–10.5)

## 2013-11-04 LAB — HIV ANTIBODY (ROUTINE TESTING W REFLEX): HIV: NONREACTIVE

## 2013-11-04 LAB — GC/CHLAMYDIA PROBE AMP
CT Probe RNA: NEGATIVE
GC Probe RNA: NEGATIVE

## 2013-11-05 LAB — HEMOGLOBINOPATHY EVALUATION
HEMOGLOBIN OTHER: 0 %
Hgb A2 Quant: 3.1 % (ref 2.2–3.2)
Hgb A: 95.2 % — ABNORMAL LOW (ref 96.8–97.8)
Hgb F Quant: 1.7 % (ref 0.0–2.0)
Hgb S Quant: 0 %

## 2013-11-05 LAB — CANNABANOIDS (GC/LC/MS), URINE: THC-COOH (GC/LC/MS), ur confirm: 221 ng/mL — AB (ref <5)

## 2013-11-06 ENCOUNTER — Inpatient Hospital Stay (HOSPITAL_COMMUNITY)
Admission: AD | Admit: 2013-11-06 | Discharge: 2013-11-06 | Disposition: A | Discharge status: Home / Self Care | Payer: Medicaid Other | Source: Ambulatory Visit | Attending: Family Medicine | Admitting: Family Medicine

## 2013-11-06 ENCOUNTER — Encounter: Payer: Self-pay | Admitting: Family Medicine

## 2013-11-06 ENCOUNTER — Encounter (HOSPITAL_COMMUNITY): Payer: Self-pay | Admitting: *Deleted

## 2013-11-06 DIAGNOSIS — F1210 Cannabis abuse, uncomplicated: Secondary | ICD-10-CM | POA: Insufficient documentation

## 2013-11-06 DIAGNOSIS — K219 Gastro-esophageal reflux disease without esophagitis: Secondary | ICD-10-CM | POA: Insufficient documentation

## 2013-11-06 DIAGNOSIS — R064 Hyperventilation: Secondary | ICD-10-CM | POA: Insufficient documentation

## 2013-11-06 DIAGNOSIS — K209 Esophagitis, unspecified without bleeding: Secondary | ICD-10-CM | POA: Insufficient documentation

## 2013-11-06 DIAGNOSIS — K589 Irritable bowel syndrome without diarrhea: Secondary | ICD-10-CM | POA: Insufficient documentation

## 2013-11-06 DIAGNOSIS — J45909 Unspecified asthma, uncomplicated: Secondary | ICD-10-CM | POA: Insufficient documentation

## 2013-11-06 DIAGNOSIS — O210 Mild hyperemesis gravidarum: Secondary | ICD-10-CM | POA: Insufficient documentation

## 2013-11-06 DIAGNOSIS — O99519 Diseases of the respiratory system complicating pregnancy, unspecified trimester: Secondary | ICD-10-CM

## 2013-11-06 DIAGNOSIS — O9989 Other specified diseases and conditions complicating pregnancy, childbirth and the puerperium: Secondary | ICD-10-CM

## 2013-11-06 DIAGNOSIS — O99891 Other specified diseases and conditions complicating pregnancy: Secondary | ICD-10-CM | POA: Insufficient documentation

## 2013-11-06 LAB — PRESCRIPTION MONITORING PROFILE (19 PANEL)
Amphetamine/Meth: NEGATIVE ng/mL
BARBITURATE SCREEN, URINE: NEGATIVE ng/mL
Benzodiazepine Screen, Urine: NEGATIVE ng/mL
Buprenorphine, Urine: NEGATIVE ng/mL
Carisoprodol, Urine: NEGATIVE ng/mL
Cocaine Metabolites: NEGATIVE ng/mL
Creatinine, Urine: 248.47 mg/dL (ref >20.0)
FENTANYL URINE: NEGATIVE ng/mL
MDMA URINE: NEGATIVE ng/mL
Meperidine, Ur: NEGATIVE ng/mL
Methadone Screen, Urine: NEGATIVE ng/mL
Methaqualone: NEGATIVE ng/mL
Nitrites, Initial: NEGATIVE ug/mL
OPIATE SCREEN, URINE: NEGATIVE ng/mL
OXYCODONE SCRN UR: NEGATIVE ng/mL
PHENCYCLIDINE, UR: NEGATIVE ng/mL
Propoxyphene: NEGATIVE ng/mL
Tapentadol, urine: NEGATIVE ng/mL
Tramadol Scrn, Ur: NEGATIVE ng/mL
ZOLPIDEM, URINE: NEGATIVE ng/mL
pH, Initial: 7.6 pH (ref 4.5–8.9)

## 2013-11-06 LAB — COMPREHENSIVE METABOLIC PANEL
ALBUMIN: 3.9 g/dL (ref 3.5–5.2)
ALT: 9 U/L (ref 0–35)
AST: 13 U/L (ref 0–37)
Alkaline Phosphatase: 53 U/L (ref 39–117)
BUN: 9 mg/dL (ref 6–23)
CO2: 19 mEq/L (ref 19–32)
CREATININE: 0.56 mg/dL (ref 0.50–1.10)
Calcium: 9.7 mg/dL (ref 8.4–10.5)
Chloride: 100 mEq/L (ref 96–112)
GFR calc Af Amer: >90 mL/min (ref >90)
GFR calc non Af Amer: >90 mL/min (ref >90)
Glucose, Bld: 103 mg/dL — ABNORMAL HIGH (ref 70–99)
Potassium: 3.2 mEq/L — ABNORMAL LOW (ref 3.7–5.3)
Sodium: 136 mEq/L — ABNORMAL LOW (ref 137–147)
TOTAL PROTEIN: 7.1 g/dL (ref 6.0–8.3)
Total Bilirubin: 0.5 mg/dL (ref 0.3–1.2)

## 2013-11-06 LAB — URINALYSIS, ROUTINE W REFLEX MICROSCOPIC
Bilirubin Urine: NEGATIVE
GLUCOSE, UA: NEGATIVE mg/dL
Hgb urine dipstick: NEGATIVE
Ketones, ur: NEGATIVE mg/dL
Leukocytes, UA: NEGATIVE
NITRITE: NEGATIVE
PH: 7 (ref 5.0–8.0)
Protein, ur: NEGATIVE mg/dL
Specific Gravity, Urine: 1.02 (ref 1.005–1.030)
Urobilinogen, UA: 0.2 mg/dL (ref 0.0–1.0)

## 2013-11-06 LAB — CBC
HEMATOCRIT: 35.8 % — AB (ref 36.0–46.0)
Hemoglobin: 12.9 g/dL (ref 12.0–15.0)
MCH: 32.3 pg (ref 26.0–34.0)
MCHC: 36 g/dL (ref 30.0–36.0)
MCV: 89.7 fL (ref 78.0–100.0)
Platelets: 285 10*3/uL (ref 150–400)
RBC: 3.99 MIL/uL (ref 3.87–5.11)
RDW: 12.7 % (ref 11.5–15.5)
WBC: 6.8 10*3/uL (ref 4.0–10.5)

## 2013-11-06 LAB — CULTURE, OB URINE: Colony Count: >=100000

## 2013-11-06 MED ORDER — SODIUM CHLORIDE 0.9 % IV SOLN
INTRAVENOUS | Status: DC
  Administered 2013-11-06: 18:00:00 via INTRAVENOUS
  Filled 2013-11-06 (×3): qty 1000

## 2013-11-06 MED ORDER — PROMETHAZINE HCL 25 MG/ML IJ SOLN
25.0000 mg | Freq: Four times a day (QID) | INTRAMUSCULAR | Status: DC | PRN
  Administered 2013-11-06: 25 mg via INTRAVENOUS
  Filled 2013-11-06: qty 1

## 2013-11-06 MED ORDER — DEXTROSE 5 % IN LACTATED RINGERS IV BOLUS
1000.0000 mL | Freq: Once | INTRAVENOUS | Status: AC
  Administered 2013-11-06: 1000 mL via INTRAVENOUS

## 2013-11-06 MED ORDER — FAMOTIDINE IN NACL 20-0.9 MG/50ML-% IV SOLN
20.0000 mg | INTRAVENOUS | Status: AC
  Administered 2013-11-06: 20 mg via INTRAVENOUS
  Filled 2013-11-06: qty 50

## 2013-11-06 MED ORDER — GLYCOPYRROLATE 0.2 MG/ML IJ SOLN
0.1000 mg | Freq: Once | INTRAMUSCULAR | Status: AC
  Administered 2013-11-06: 0.1 mg via INTRAVENOUS
  Filled 2013-11-06: qty 0.5

## 2013-11-06 NOTE — Progress Notes (Signed)
See discharge charting  On down time forms.

## 2013-11-06 NOTE — MAU Provider Note (Signed)
Chief Complaint: Dehydration   First Provider Initiated Contact with Patient 11/06/13 1537     SUBJECTIVE HPI: [name] is a 33 y.o. G3P2002 at 3171w0d by LMP who presents to maternity admissions reporting n/v x2 weeks, unable to keep down food or fluids, feeling dizzy like she could pass out today.  She reports she was prescribed phenergan tablets and suppositories but could not afford the suppositories.  She has not been able to keep down the tablets.  She reports she has daily heartburn and is spitting also and does not have medication for these.  She denies abdominal pain, vaginal bleeding, vaginal itching/burning, urinary symptoms, h/a, or fever/chills.    Per RN, pt was hyperventilating on arrival to MAU but pt is breathing normally but appears anxious during my exam.   Past Medical History  Diagnosis Date  . GERD (gastroesophageal reflux disease)   . Erosive esophagitis     distal esophagus, with low grade bleed 03/2009  . IBS (irritable bowel syndrome)   . Anemia   . History of migraine headaches   . Asthma   . ZOXWRUEA(540.9Headache(784.0)    Past Surgical History  Procedure Laterality Date  . No past surgeries     History   Social History  . Marital Status: Legally Separated    Spouse Name: N/A    Number of Children: N/A  . Years of Education: N/A   Occupational History  . Not on file.   Social History Main Topics  . Smoking status: Never Smoker   . Smokeless tobacco: Never Used  . Alcohol Use: No     Comment: occasional on holidays  . Drug Use: No  . Sexual Activity: Yes    Partners: Male    Birth Control/ Protection: None   Other Topics Concern  . Not on file   Social History Narrative   [place]   Works at [employer], job requires frequent phone calling, works Tuesday - Friday   No current facility-administered medications on file prior to encounter.   Current Outpatient Prescriptions on File Prior to Encounter  Medication Sig  Dispense Refill  . ondansetron (ZOFRAN) 4 MG tablet Take 1 tablet (4 mg total) by mouth every 6 (six) hours.  30 tablet  2  . promethazine (PHENERGAN) 25 MG suppository Place 1 suppository (25 mg total) rectally every 6 (six) hours as needed for nausea or vomiting.  12 each  0  . promethazine (PHENERGAN) 25 MG tablet Take 1 tablet (25 mg total) by mouth every 6 (six) hours as needed for nausea or vomiting.  30 tablet  2   Allergies  Allergen Reactions  . Ibuprofen Other (See Comments)    Ulcer flare up     ROS: Pertinent items in HPI  OBJECTIVE Blood pressure 129/76, pulse 93, temperature 97.7 F (36.5 C), temperature source Oral, resp. rate 22, last menstrual period 09/18/2013, SpO2 100.00%. GENERAL: Well-developed, well-nourished female in mild distress.  HEENT: Normocephalic HEART: normal rate, rhythm, heart sounds RESP: normal effort, lung sounds clear and equal bilaterally ABDOMEN: Soft, non-tender EXTREMITIES: Nontender, no edema NEURO: Alert and oriented  LAB RESULTS   Results for orders placed during the hospital encounter of 11/06/13 (from the past 168 hour(s))  CBC   Collection Time    11/06/13  3:55 PM      Result Value Ref Range   WBC 6.8  4.0 - 10.5 K/uL   RBC 3.99  3.87 - 5.11 MIL/uL  Hemoglobin 12.9  12.0 - 15.0 g/dL   HCT 11.935.8 (*) 14.736.0 - 82.946.0 %   MCV 89.7  78.0 - 100.0 fL   MCH 32.3  26.0 - 34.0 pg   MCHC 36.0  30.0 - 36.0 g/dL   RDW 56.212.7  13.011.5 - 86.515.5 %   Platelets 285  150 - 400 K/uL  COMPREHENSIVE METABOLIC PANEL   Collection Time    11/06/13  3:55 PM      Result Value Ref Range   Sodium 136 (*) 137 - 147 mEq/L   Potassium 3.2 (*) 3.7 - 5.3 mEq/L   Chloride 100  96 - 112 mEq/L   CO2 19  19 - 32 mEq/L   Glucose, Bld 103 (*) 70 - 99 mg/dL   BUN 9  6 - 23 mg/dL   Creatinine, Ser 7.840.56  0.50 - 1.10 mg/dL   Calcium 9.7  8.4 - 69.610.5 mg/dL   Total Protein 7.1  6.0 - 8.3 g/dL   Albumin 3.9  3.5 - 5.2 g/dL   AST 13  0 - 37 U/L   ALT 9  0 - 35 U/L    Alkaline Phosphatase 53  39 - 117 U/L   Total Bilirubin 0.5  0.3 - 1.2 mg/dL   GFR calc non Af Amer >90  >90 mL/min   GFR calc Af Amer >90  >90 mL/min  URINALYSIS, ROUTINE W REFLEX MICROSCOPIC   Collection Time    11/06/13  8:50 PM      Result Value Ref Range   Color, Urine YELLOW  YELLOW   APPearance CLEAR  CLEAR   Specific Gravity, Urine 1.020  1.005 - 1.030   pH 7.0  5.0 - 8.0   Glucose, UA NEGATIVE  NEGATIVE mg/dL   Hgb urine dipstick NEGATIVE  NEGATIVE   Bilirubin Urine NEGATIVE  NEGATIVE   Ketones, ur NEGATIVE  NEGATIVE mg/dL   Protein, ur NEGATIVE  NEGATIVE mg/dL   Urobilinogen, UA 0.2  0.0 - 1.0 mg/dL   Nitrite NEGATIVE  NEGATIVE   Leukocytes, UA NEGATIVE  NEGATIVE    ASSESSMENT 1. Asthma complicating pregnancy, antepartum   2. Hyperemesis complicating pregnancy, antepartum     PLAN IV fluids, antiemetics, Pepcid, and Robinul given in MAU Discharge home Pt can use Phenergan tablets rectally or vaginally as needed if suppositories too costly Rx for Robinul, Zantac added to manage spitting and acid reflux Keep scheduled appointments in clinic Return to MAU as needed for emergencies    Sharen CounterLisa Leftwich-Kirby Certified Nurse-Midwife 11/06/2013  3:56 PM

## 2013-11-06 NOTE — MAU Note (Signed)
Pt reports she feels like she is dehydrated. She feels weak like she needs to pass out. Has been having nausea and vomiting for 2 weeks. Pt is anxious and hyperventilating.

## 2013-11-06 NOTE — Discharge Instructions (Signed)
Nausea medication to take during pregnancy:   Unisom (doxylamine succinate 25 mg tablets) Take one tablet daily at bedtime. If symptoms are not adequately controlled, the dose can be increased to a maximum recommended dose of two tablets daily (1/2 tablet in the morning, 1/2 tablet mid-afternoon and one at bedtime).  Vitamin B6 100mg  tablets. Take one tablet twice a day (up to 200 mg per day).  Add Phenergan as prescribed to take as needed.   Hyperemesis Gravidarum Hyperemesis gravidarum is a severe form of nausea and vomiting that happens during pregnancy. Hyperemesis is worse than morning sickness. It may cause you to have nausea or vomiting all day for many days. It may keep you from eating and drinking enough food and liquids. Hyperemesis usually occurs during the first half (the first 20 weeks) of pregnancy. It often goes away once a woman is in her second half of pregnancy. However, sometimes hyperemesis continues through an entire pregnancy.  CAUSES  The cause of this condition is not completely known but is thought to be related to changes in the body's hormones when pregnant. It could be from the high level of the pregnancy hormone or an increase in estrogen in the body.  SIGNS AND SYMPTOMS   Severe nausea and vomiting.  Nausea that does not go away.  Vomiting that does not allow you to keep any food down.  Weight loss and body fluid loss (dehydration).  Having no desire to eat or not liking food you have previously enjoyed. DIAGNOSIS  Your health care provider will do a physical exam and ask you about your symptoms. He or she may also order blood tests and urine tests to make sure something else is not causing the problem.  TREATMENT  You may only need medicine to control the problem. If medicines do not control the nausea and vomiting, you will be treated in the hospital to prevent dehydration, increased acid in the blood (acidosis), weight loss, and changes in the electrolytes  in your body that may harm the unborn baby (fetus). You may need IV fluids.  HOME CARE INSTRUCTIONS   Only take over-the-counter or prescription medicines as directed by your health care provider.  Try eating a couple of dry crackers or toast in the morning before getting out of bed.  Avoid foods and smells that upset your stomach.  Avoid fatty and spicy foods.  Eat 5 6 small meals a day.  Do not drink when eating meals. Drink between meals.  For snacks, eat high-protein foods, such as cheese.  Eat or suck on things that have ginger in them. Ginger helps nausea.  Avoid food preparation. The smell of food can spoil your appetite.  Avoid iron pills and iron in your multivitamins until after 3 4 months of being pregnant. However, consult with your health care provider before stopping any prescribed iron pills. SEEK MEDICAL CARE IF:   Your abdominal pain increases.  You have a severe headache.  You have vision problems.  You are losing weight. SEEK IMMEDIATE MEDICAL CARE IF:   You are unable to keep fluids down.  You vomit blood.  You have constant nausea and vomiting.  You have excessive weakness.  You have extreme thirst.  You have dizziness or fainting.  You have a fever or persistent symptoms for more than 2 3 days.  You have a fever and your symptoms suddenly get worse. MAKE SURE YOU:   Understand these instructions.  Will watch your condition.  Will get  help right away if you are not doing well or get worse. Document Released: 06/18/2005 Document Revised: 04/08/2013 Document Reviewed: 01/28/2013 [facility] Patient Information 2014 [place]. Hyperemesis Gravidarum Diet Hyperemesis gravidarum is a severe form of morning sickness. It is characterized by frequent and severe vomiting. It happens during the first trimester of pregnancy. It may be caused by the rapid hormone changes that happen during pregnancy. It is associated with a 5% weight loss of  pre-pregnancy weight. The hyperemesis diet may be used to lessen symptoms of nausea and vomiting. EATING GUIDELINES  Eat 5 to 6 small meals daily instead of 3 large meals.  Avoid foods with strong smells.  Avoid drinking 30 minutes before and after meals.  Avoid fried or high-fat foods, such as butter and cream sauces.  Starchy foods are usually well-tolerated, such as cereal, toast, bread, potatoes, pasta, rice, and pretzels.  Eat crackers before you get out of bed in the morning.  Avoid spicy foods.  Ginger may help with nausea. Add  tsp ginger to hot tea or choose ginger tea.  Continue to take your prenatal vitamins as directed by your caregiver. SAMPLE MEAL PLAN Breakfast    cup oatmeal  1 slice toast  1 tsp heart-healthy margarine  1 tsp jelly  1 scrambled egg Midmorning Snack   1 cup low-fat yogurt Lunch   Plain ham sandwich  Carrot or celery sticks  1 small apple  3 graham crackers Midafternoon Snack   Cheese and crackers Dinner  4 oz pork tenderloin  1 small baked potato  1 tsp margarine   cup broccoli   cup grapes Evening Snack  1 cup pudding Document Released: 04/15/2007 Document Revised: 09/10/2011 Document Reviewed: 11/18/2012 [employer] Patient Information 2014 [place], [place].

## 2013-11-09 NOTE — MAU Provider Note (Signed)
Attestation of Attending Supervision of Advanced Practitioner (PA/CNM/NP): Evaluation and management procedures were performed by the Advanced Practitioner under my supervision and collaboration.  I have reviewed the Advanced Practitioner's note and chart, and I agree with the management and plan.  Jaye Saal S Nichola Warren, MD [facility] Attending 11/09/2013 6:15 PM   

## 2013-11-11 ENCOUNTER — Ambulatory Visit (INDEPENDENT_AMBULATORY_CARE_PROVIDER_SITE_OTHER): Payer: Medicaid Other | Admitting: Family

## 2013-11-11 VITALS — BP 130/87 | HR 89 | Temp 98.1°F | Wt 165.5 lb

## 2013-11-11 DIAGNOSIS — J45909 Unspecified asthma, uncomplicated: Secondary | ICD-10-CM

## 2013-11-11 DIAGNOSIS — O210 Mild hyperemesis gravidarum: Secondary | ICD-10-CM

## 2013-11-11 DIAGNOSIS — O99519 Diseases of the respiratory system complicating pregnancy, unspecified trimester: Principal | ICD-10-CM

## 2013-11-11 DIAGNOSIS — O99891 Other specified diseases and conditions complicating pregnancy: Secondary | ICD-10-CM

## 2013-11-11 DIAGNOSIS — N390 Urinary tract infection, site not specified: Secondary | ICD-10-CM

## 2013-11-11 DIAGNOSIS — O9989 Other specified diseases and conditions complicating pregnancy, childbirth and the puerperium: Secondary | ICD-10-CM

## 2013-11-11 DIAGNOSIS — O2390 Unspecified genitourinary tract infection in pregnancy, unspecified trimester: Secondary | ICD-10-CM

## 2013-11-11 DIAGNOSIS — O2340 Unspecified infection of urinary tract in pregnancy, unspecified trimester: Secondary | ICD-10-CM

## 2013-11-11 LAB — POCT URINALYSIS DIP (DEVICE)
Bilirubin Urine: NEGATIVE
GLUCOSE, UA: NEGATIVE mg/dL
Ketones, ur: NEGATIVE mg/dL
Nitrite: NEGATIVE
Protein, ur: 30 mg/dL — AB
UROBILINOGEN UA: 0.2 mg/dL (ref 0.0–1.0)
pH: 6 (ref 5.0–8.0)

## 2013-11-11 MED ORDER — DOXYLAMINE-PYRIDOXINE 10-10 MG PO TBEC: DELAYED_RELEASE_TABLET | ORAL | Status: DC

## 2013-11-11 MED ORDER — NITROFURANTOIN MONOHYD MACRO 100 MG PO CAPS: 100.0000 mg | ORAL_CAPSULE | Freq: Two times a day (BID) | ORAL | Status: DC

## 2013-11-11 NOTE — Progress Notes (Signed)
Reports increased nausea and fatigue.  Vomited x 4 in past 24 hr.  Urine - no ketones.  Begin Diclegis.  Given food suggestions (protein shakes).  Will call to apply for medicaid. Urine mod leuks, +dysuria > Macrobid to pharmacy and urine culture.  Schedule first screen at next appointment.

## 2013-11-11 NOTE — Progress Notes (Signed)
Pt states she is weak, nauseated all the time, limited vomiting.  Pt cannot take any vitamins.

## 2013-11-15 LAB — CULTURE, OB URINE

## 2013-12-01 ENCOUNTER — Other Ambulatory Visit: Payer: Self-pay | Admitting: Advanced Practice Midwife

## 2013-12-01 ENCOUNTER — Encounter: Payer: Self-pay | Admitting: Advanced Practice Midwife

## 2013-12-01 ENCOUNTER — Ambulatory Visit (INDEPENDENT_AMBULATORY_CARE_PROVIDER_SITE_OTHER): Payer: Medicaid Other | Admitting: Advanced Practice Midwife

## 2013-12-01 VITALS — BP 131/72 | HR 92 | Temp 98.2°F | Wt 173.0 lb

## 2013-12-01 DIAGNOSIS — Z3682 Encounter for antenatal screening for nuchal translucency: Secondary | ICD-10-CM

## 2013-12-01 DIAGNOSIS — O210 Mild hyperemesis gravidarum: Secondary | ICD-10-CM

## 2013-12-01 DIAGNOSIS — D649 Anemia, unspecified: Secondary | ICD-10-CM

## 2013-12-01 DIAGNOSIS — F1210 Cannabis abuse, uncomplicated: Secondary | ICD-10-CM

## 2013-12-01 LAB — POCT URINALYSIS DIP (DEVICE)
BILIRUBIN URINE: NEGATIVE
Glucose, UA: NEGATIVE mg/dL
Ketones, ur: NEGATIVE mg/dL
Leukocytes, UA: NEGATIVE
NITRITE: NEGATIVE
PH: 5.5 (ref 5.0–8.0)
PROTEIN: NEGATIVE mg/dL
Specific Gravity, Urine: >=1.03 (ref 1.005–1.030)
UROBILINOGEN UA: 0.2 mg/dL (ref 0.0–1.0)

## 2013-12-01 MED ORDER — BUTALBITAL-APAP-CAFFEINE 50-325-40 MG PO CAPS: 1.0000 | ORAL_CAPSULE | ORAL | Status: DC | PRN

## 2013-12-01 NOTE — Progress Notes (Signed)
Doing well.  Good fetal movement, denies vaginal bleeding, LOF, cramping/contractions. Nausea improved with Diclegis.  Has hx of migraines and these have returned with 2 h/as this week.  Reports drinking plenty of water daily.  Fioricet for occasional h/a.  F/U at next appt if need further tx or evaluation.  Has seen neurology in the past, no current f/u with neurology.

## 2013-12-01 NOTE — Progress Notes (Signed)
Patient reports intermittent lower pelvic pressure. C/o of migraines; started last week-- reports used to take tylenol #3 for them but has not been this time.

## 2013-12-06 ENCOUNTER — Inpatient Hospital Stay (HOSPITAL_COMMUNITY)
Admission: AD | Admit: 2013-12-06 | Discharge: 2013-12-06 | Disposition: A | Discharge status: Home / Self Care | Payer: Medicaid Other | Source: Ambulatory Visit | Attending: Obstetrics & Gynecology | Admitting: Obstetrics & Gynecology

## 2013-12-06 ENCOUNTER — Encounter (HOSPITAL_COMMUNITY): Payer: Self-pay | Admitting: Family

## 2013-12-06 DIAGNOSIS — O209 Hemorrhage in early pregnancy, unspecified: Secondary | ICD-10-CM

## 2013-12-06 DIAGNOSIS — Z8249 Family history of ischemic heart disease and other diseases of the circulatory system: Secondary | ICD-10-CM | POA: Insufficient documentation

## 2013-12-06 DIAGNOSIS — O26859 Spotting complicating pregnancy, unspecified trimester: Secondary | ICD-10-CM | POA: Insufficient documentation

## 2013-12-06 DIAGNOSIS — K589 Irritable bowel syndrome without diarrhea: Secondary | ICD-10-CM | POA: Insufficient documentation

## 2013-12-06 DIAGNOSIS — J45909 Unspecified asthma, uncomplicated: Secondary | ICD-10-CM | POA: Insufficient documentation

## 2013-12-06 DIAGNOSIS — K219 Gastro-esophageal reflux disease without esophagitis: Secondary | ICD-10-CM | POA: Insufficient documentation

## 2013-12-06 LAB — CBC
HEMATOCRIT: 33.5 % — AB (ref 36.0–46.0)
Hemoglobin: 12 g/dL (ref 12.0–15.0)
MCH: 32.8 pg (ref 26.0–34.0)
MCHC: 35.8 g/dL (ref 30.0–36.0)
MCV: 91.5 fL (ref 78.0–100.0)
Platelets: 286 10*3/uL (ref 150–400)
RBC: 3.66 MIL/uL — AB (ref 3.87–5.11)
RDW: 14.1 % (ref 11.5–15.5)
WBC: 9 10*3/uL (ref 4.0–10.5)

## 2013-12-06 LAB — WET PREP, GENITAL
Clue Cells Wet Prep HPF POC: NONE SEEN
Trich, Wet Prep: NONE SEEN
Yeast Wet Prep HPF POC: NONE SEEN

## 2013-12-06 LAB — URINE MICROSCOPIC-ADD ON

## 2013-12-06 LAB — URINALYSIS, ROUTINE W REFLEX MICROSCOPIC
BILIRUBIN URINE: NEGATIVE
Glucose, UA: NEGATIVE mg/dL
KETONES UR: NEGATIVE mg/dL
Leukocytes, UA: NEGATIVE
Nitrite: NEGATIVE
Protein, ur: NEGATIVE mg/dL
SPECIFIC GRAVITY, URINE: 1.015 (ref 1.005–1.030)
UROBILINOGEN UA: 0.2 mg/dL (ref 0.0–1.0)
pH: 8 (ref 5.0–8.0)

## 2013-12-06 NOTE — Discharge Instructions (Signed)
Threatened Miscarriage  A threatened miscarriage is a pregnancy that may end. It may be marked by bleeding during the first 20 weeks of pregnancy. Often, the pregnancy can continue without any more problems. You may be asked to stop:  Having sex (intercourse).  Having orgasms.  Using tampons.  Exercising.  Doing heavy physical activity and work. HOME CARE   Your doctor may tell you to take bed rest and to stop activities and work.  Write down the number of pads you use each day. Write down how often you change pads. Write down how soaked they are.  Follow your doctor's advice for follow-up visits and tests.  If your blood type is Rh-negative and the father's blood is Rh-positive (or is not known), you may get a shot to protect the baby.  If you have a miscarriage, save all the tissue you pass in a container. Take the container to your doctor. GET HELP RIGHT AWAY IF:   You have bad cramps or pain in your belly (abdomen), lower belly, or back.  You have a fever or chills.  Your bleeding gets worse or you pass large clots of blood or tissue. Save this tissue to show your doctor.  You feel lightheaded, weak, dizzy, or pass out (faint).  You have a gush of fluid from your vagina. MAKE SURE YOU:   Understand these instructions.  Will watch your condition.  Will get help right away if you are not doing well or get worse. Document Released: 05/31/2008 Document Revised: 09/10/2011 Document Reviewed: 07/04/2009 [facility] Patient Information 2014 [place], [place].  Pelvic Rest Pelvic rest is sometimes recommended for women when:   The placenta is partially or completely covering the opening of the cervix (placenta previa).  There is bleeding between the uterine wall and the amniotic sac in the first trimester (subchorionic hemorrhage).  The cervix begins to open without labor starting (incompetent cervix, cervical insufficiency).  The labor is too early (preterm labor). HOME  CARE INSTRUCTIONS  Do not have sexual intercourse, stimulation, or an orgasm.  Do not use tampons, douche, or put anything in the vagina.  Do not lift anything over 10 pounds (4.5 kg).  Avoid strenuous activity or straining your pelvic muscles. SEEK MEDICAL CARE IF:  You have any vaginal bleeding during pregnancy. Treat this as a potential emergency.  You have cramping pain felt low in the stomach (stronger than menstrual cramps).  You notice vaginal discharge (watery, mucus, or bloody).  You have a low, dull backache.  There are regular contractions or uterine tightening. SEEK IMMEDIATE MEDICAL CARE IF: You have vaginal bleeding and have placenta previa.  Document Released: 10/13/2010 Document Revised: 09/10/2011 Document Reviewed: 10/13/2010 [facility] Patient Information 2014 [place], [place].

## 2013-12-06 NOTE — MAU Provider Note (Signed)
History     CSN: [id number]  Arrival date and time: 12/06/13 5035   First Provider Initiated Contact with Patient 12/06/13 [phone]      Chief Complaint  Patient presents with  . Vaginal Bleeding   HPI Comments: [name] 33 y.o.G3P2002 [redacted]w[redacted]d presents to MAU with vaginal bleeding since intercourse yesterday. She describes it as spotting when she wipes only. She also has a pressure sensation and it is 5/10 pain scale. She has known IUG and has repeat U/S on 5/19.  She denies any blood clotting issues and has O+.  Married, stable relationship with negative GC/ Chlamydia in May 2015  Vaginal Bleeding      Past Medical History  Diagnosis Date  . GERD (gastroesophageal reflux disease)   . Erosive esophagitis     distal esophagus, with low grade bleed 03/2009  . IBS (irritable bowel syndrome)   . Anemia   . History of migraine headaches   . Asthma   . CLEXNTZG(017.4)     Past Surgical History  Procedure Laterality Date  . No past surgeries      Family History  Problem Relation Age of Onset  . Heart disease Mother   . Migraines Mother   . Migraines Father   . Asthma Daughter     History  Substance Use Topics  . Smoking status: Never Smoker   . Smokeless tobacco: Never Used  . Alcohol Use: No     Comment: occasional on holidays    Allergies:  Allergies  Allergen Reactions  . Ibuprofen Other (See Comments)    Ulcer flare up     Prescriptions prior to admission  Medication Sig Dispense Refill  . acetaminophen (TYLENOL) 500 MG tablet Take 1,000 mg by mouth once.      . Doxylamine-Pyridoxine (DICLEGIS) 10-10 MG TBEC Two at bedtime, one in the morning, one in the afternoon.  100 tablet  1  . Butalbital-APAP-Caffeine 50-325-40 MG per capsule Take 1 capsule by mouth every 4 (four) hours as needed for pain.  30 capsule  0    Review of Systems  Constitutional: Negative.   HENT: Negative.   Eyes: Negative.   Respiratory: Negative.   Cardiovascular:  Negative.   Gastrointestinal: Negative.   Genitourinary: Negative.        Pelvic pressure with vaginal bleeding  Skin: Negative.   Neurological: Negative.   Psychiatric/Behavioral: Negative.    Physical Exam   Blood pressure 140/69, pulse 79, temperature 98.1 F (36.7 C), temperature source Oral, resp. rate 13, last menstrual period 09/18/2013.  Physical Exam  Constitutional: She is oriented to person, place, and time. She appears well-developed and well-nourished. No distress.  HENT:  Head: Normocephalic and atraumatic.  Eyes: Conjunctivae are normal. Pupils are equal, round, and reactive to light.  GI: Soft. She exhibits no distension. There is no tenderness. There is no rebound.  Genitourinary:  Genital: external negative Vaginal:small amount blood with one 1 cm clot Cervix: closed/ thick Bimanual: nontender   Musculoskeletal: Normal range of motion.  Neurological: She is alert and oriented to person, place, and time.  Skin: Skin is warm and dry.  Psychiatric: She has a normal mood and affect. Her behavior is normal. Judgment and thought content normal.   +FHT  Results for orders placed during the hospital encounter of 12/06/13 (from the past 24 hour(s))  URINALYSIS, ROUTINE W REFLEX MICROSCOPIC     Status: Abnormal   Collection Time    12/06/13  7:50 AM  Result Value Ref Range   Color, Urine YELLOW  YELLOW   APPearance HAZY (*) CLEAR   Specific Gravity, Urine 1.015  1.005 - 1.030   pH 8.0  5.0 - 8.0   Glucose, UA NEGATIVE  NEGATIVE mg/dL   Hgb urine dipstick TRACE (*) NEGATIVE   Bilirubin Urine NEGATIVE  NEGATIVE   Ketones, ur NEGATIVE  NEGATIVE mg/dL   Protein, ur NEGATIVE  NEGATIVE mg/dL   Urobilinogen, UA 0.2  0.0 - 1.0 mg/dL   Nitrite NEGATIVE  NEGATIVE   Leukocytes, UA NEGATIVE  NEGATIVE  URINE MICROSCOPIC-ADD ON     Status: Abnormal   Collection Time    12/06/13  7:50 AM      Result Value Ref Range   Squamous Epithelial / LPF FEW (*) RARE   WBC, UA  0-2  <3 WBC/hpf   RBC / HPF 0-2  <3 RBC/hpf   Bacteria, UA FEW (*) RARE   Urine-Other AMORPHOUS URATES/PHOSPHATES    WET PREP, GENITAL     Status: Abnormal   Collection Time    12/06/13  8:15 AM      Result Value Ref Range   Yeast Wet Prep HPF POC NONE SEEN  NONE SEEN   Trich, Wet Prep NONE SEEN  NONE SEEN   Clue Cells Wet Prep HPF POC NONE SEEN  NONE SEEN   WBC, Wet Prep HPF POC FEW (*) NONE SEEN  CBC     Status: Abnormal   Collection Time    12/06/13  8:38 AM      Result Value Ref Range   WBC 9.0  4.0 - 10.5 K/uL   RBC 3.66 (*) 3.87 - 5.11 MIL/uL   Hemoglobin 12.0  12.0 - 15.0 g/dL   HCT 16.133.5 (*) 09.636.0 - 04.546.0 %   MCV 91.5  78.0 - 100.0 fL   MCH 32.8  26.0 - 34.0 pg   MCHC 35.8  30.0 - 36.0 g/dL   RDW 40.914.1  81.111.5 - 91.415.5 %   Platelets 286  150 - 400 K/uL     MAU Course  Procedures  MDM Discussed with Dr Penne LashLeggett who advised CBC and miscarriage precautions Wet Prep  Assessment and Plan   A: Bleeding in first trimester  P: Miscarriage Precautions Pelvic Rest/ Fluids Note for work for tomorrow Follow up in MAU/ Clinic if bleeding continues  Delbert PhenixLinda M Sabastian Raimondi 12/06/2013, 8:36 AM

## 2013-12-06 NOTE — MAU Note (Signed)
33 yo, G3P2 at [redacted]w[redacted]d, presents with c/o postcoital bleeding noted with wiping since yesterday. Denies pain, some lower pelvic pressure.

## 2013-12-07 ENCOUNTER — Telehealth: Payer: Self-pay | Admitting: *Deleted

## 2013-12-07 NOTE — MAU Provider Note (Signed)
Attestation of Attending Supervision of Advanced Practitioner (CNM/NP): Evaluation and management procedures were performed by the Advanced Practitioner under my supervision and collaboration. I have reviewed the Advanced Practitioner's note and chart, and I agree with the management and plan.  Hartlyn Reigel H Lillyauna Jenkinson 11:03 AM

## 2013-12-07 NOTE — Telephone Encounter (Signed)
Patient called and stated that she was prescribed Fioricet for her headaches but it is too expensive. She doesn't have medicaid. Would like an alternate medicine sent to the pharmacy. Will route note to [facility] for new rx.

## 2013-12-08 ENCOUNTER — Inpatient Hospital Stay (HOSPITAL_COMMUNITY)
Admission: AD | Admit: 2013-12-08 | Discharge: 2013-12-08 | Disposition: A | Discharge status: Home / Self Care | Payer: Medicaid Other | Source: Ambulatory Visit | Attending: Family Medicine | Admitting: Family Medicine

## 2013-12-08 ENCOUNTER — Encounter (HOSPITAL_COMMUNITY): Payer: Self-pay | Admitting: *Deleted

## 2013-12-08 DIAGNOSIS — K219 Gastro-esophageal reflux disease without esophagitis: Secondary | ICD-10-CM | POA: Insufficient documentation

## 2013-12-08 DIAGNOSIS — J45909 Unspecified asthma, uncomplicated: Secondary | ICD-10-CM | POA: Insufficient documentation

## 2013-12-08 DIAGNOSIS — K589 Irritable bowel syndrome without diarrhea: Secondary | ICD-10-CM | POA: Insufficient documentation

## 2013-12-08 DIAGNOSIS — O208 Other hemorrhage in early pregnancy: Secondary | ICD-10-CM | POA: Insufficient documentation

## 2013-12-08 DIAGNOSIS — O4690 Antepartum hemorrhage, unspecified, unspecified trimester: Secondary | ICD-10-CM

## 2013-12-08 DIAGNOSIS — Z8249 Family history of ischemic heart disease and other diseases of the circulatory system: Secondary | ICD-10-CM | POA: Insufficient documentation

## 2013-12-08 LAB — URINALYSIS, ROUTINE W REFLEX MICROSCOPIC
Bilirubin Urine: NEGATIVE
Glucose, UA: NEGATIVE mg/dL
Ketones, ur: NEGATIVE mg/dL
Leukocytes, UA: NEGATIVE
NITRITE: NEGATIVE
PROTEIN: NEGATIVE mg/dL
Specific Gravity, Urine: >1.03 — ABNORMAL HIGH (ref 1.005–1.030)
UROBILINOGEN UA: 0.2 mg/dL (ref 0.0–1.0)
pH: 6 (ref 5.0–8.0)

## 2013-12-08 LAB — URINE MICROSCOPIC-ADD ON

## 2013-12-08 NOTE — MAU Note (Signed)
Patient states she has had some spotting off and on for the past couple of days. Feeling lower abdominal pressure. Patient has tissue in panties with a very small streak of dark blood, no active bleeding.

## 2013-12-08 NOTE — Discharge Instructions (Signed)
Vaginal Bleeding During Pregnancy, First Trimester °A small amount of bleeding (spotting) from the vagina is relatively common in early pregnancy. It usually stops on its own. Various things may cause bleeding or spotting in early pregnancy. Some bleeding may be related to the pregnancy, and some may not. In most cases, the bleeding is normal and is not a problem. However, bleeding can also be a sign of something serious. Be sure to tell your health care provider about any vaginal bleeding right away. °Some possible causes of vaginal bleeding during the first trimester include: °· Infection or inflammation of the cervix. °· Growths (polyps) on the cervix. °· Miscarriage or threatened miscarriage. °· Pregnancy tissue has developed outside of the uterus and in a fallopian tube (tubal pregnancy). °· Tiny cysts have developed in the uterus instead of pregnancy tissue (molar pregnancy). °HOME CARE INSTRUCTIONS  °Watch your condition for any changes. The following actions may help to lessen any discomfort you are feeling: °· Follow your health care provider's instructions for limiting your activity. If your health care provider orders bed rest, you may need to stay in bed and only get up to use the bathroom. However, your health care provider may allow you to continue light activity. °· If needed, make plans for someone to help with your regular activities and responsibilities while you are on bed rest. °· Keep track of the number of pads you use each day, how often you change pads, and how soaked (saturated) they are. Write this down. °· Do not use tampons. Do not douche. °· Do not have sexual intercourse or orgasms until approved by your health care provider. °· If you pass any tissue from your vagina, save the tissue so you can show it to your health care provider. °· Only take over-the-counter or prescription medicines as directed by your health care provider. °· Do not take aspirin because it can make you  bleed. °· Keep all follow-up appointments as directed by your health care provider. °SEEK MEDICAL CARE IF: °· You have any vaginal bleeding during any part of your pregnancy. °· You have cramps or labor pains. °SEEK IMMEDIATE MEDICAL CARE IF:  °· You have severe cramps in your back or belly (abdomen). °· You have a fever, not controlled by medicine. °· You pass large clots or tissue from your vagina. °· Your bleeding increases. °· You feel lightheaded or weak, or you have fainting episodes. °· You have chills. °· You are leaking fluid or have a gush of fluid from your vagina. °· You pass out while having a bowel movement. °MAKE SURE YOU: °· Understand these instructions. °· Will watch your condition. °· Will get help right away if you are not doing well or get worse. °Document Released: 03/28/2005 Document Revised: 04/08/2013 Document Reviewed: 02/23/2013 °ExitCare® Patient Information ©2014 [employer]. ° °

## 2013-12-08 NOTE — MAU Provider Note (Signed)
  History     CSN: [id number]  Arrival date and time: 12/08/13 1025   First Provider Initiated Contact with Patient 12/08/13 1055      Chief Complaint  Patient presents with  . Vaginal Bleeding   Vaginal Bleeding Pertinent negatives include no abdominal pain, dysuria, frequency or urgency.    Pt is a 33 yo G3P2002 at [redacted]w[redacted]d wks IUP here with report of spotting of blood that returned today with wiping.  Seen in MAU on 12/06/13 for bleeding, +FHT that visit.  Denies cramping.    Past Medical History  Diagnosis Date  . GERD (gastroesophageal reflux disease)   . Erosive esophagitis     distal esophagus, with low grade bleed 03/2009  . IBS (irritable bowel syndrome)   . Anemia   . History of migraine headaches   . Asthma   . NKNLZJQB(341.9)     Past Surgical History  Procedure Laterality Date  . No past surgeries      Family History  Problem Relation Age of Onset  . Heart disease Mother   . Migraines Mother   . Migraines Father   . Asthma Daughter     History  Substance Use Topics  . Smoking status: Never Smoker   . Smokeless tobacco: Never Used  . Alcohol Use: No     Comment: occasional on holidays    Allergies:  Allergies  Allergen Reactions  . Ibuprofen Other (See Comments)    Ulcer flare up     Prescriptions prior to admission  Medication Sig Dispense Refill  . acetaminophen (TYLENOL) 500 MG tablet Take 1,000 mg by mouth once.      . Doxylamine-Pyridoxine (DICLEGIS) 10-10 MG TBEC Two at bedtime, one in the morning, one in the afternoon.  100 tablet  1    Review of Systems  Gastrointestinal: Negative for abdominal pain.  Genitourinary: Positive for vaginal bleeding. Negative for dysuria, urgency and frequency.       Vaginal spotting of blood  All other systems reviewed and are negative.  Physical Exam   Blood pressure 131/76, pulse 93, temperature 98.4 F (36.9 C), temperature source Oral, resp. rate 16, height 5\' 6"  (1.676 m), weight 80.287 kg (177  lb), last menstrual period 09/18/2013, SpO2 99.00%.  Physical Exam  Constitutional: She is oriented to person, place, and time. She appears well-developed and well-nourished. No distress.  HENT:  Head: Normocephalic.  Neck: Normal range of motion. Neck supple.  Cardiovascular: Normal rate, regular rhythm and normal heart sounds.   Respiratory: Effort normal and breath sounds normal. No respiratory distress.  GI: Soft. There is no tenderness.  Genitourinary: Uterus is enlarged. Right adnexum displays no mass, no tenderness and no fullness. Left adnexum displays no mass, no tenderness and no fullness. No bleeding ( scant bleeding) around the vagina.  Musculoskeletal: Normal range of motion.  Neurological: She is alert and oriented to person, place, and time.  Skin: Skin is warm and dry.   +FHR 161  MAU Course  Procedures  Assessment and Plan  Vaginal Bleeding in Pregnancy Subchorionic Hemorrhage - (seen on early ultrasound)  Plan: Discharge to home Bleeding precautions Keep scheduled appointment on 12/18/13.  Eino Farber N Muhammad 12/08/2013, 10:57 AM

## 2013-12-08 NOTE — MAU Provider Note (Signed)
Attestation of Attending Supervision of Advanced Practitioner (PA/CNM/NP): Evaluation and management procedures were performed by the Advanced Practitioner under my supervision and collaboration.  I have reviewed the Advanced Practitioner's note and chart, and I agree with the management and plan.  Reva Bores, MD [facility] Attending 12/08/2013 11:26 AM

## 2013-12-09 MED ORDER — CYCLOBENZAPRINE HCL 10 MG PO TABS: 10.0000 mg | ORAL_TABLET | Freq: Three times a day (TID) | ORAL | Status: DC | PRN

## 2013-12-09 NOTE — Telephone Encounter (Signed)
Pt Rx for h/a's switched from Fioricet to Flexeril.  Also recommend she have appt with Jannifer Rodney, NP, to evaluate and manage frequent migraines.

## 2013-12-11 ENCOUNTER — Encounter (HOSPITAL_COMMUNITY): Payer: Self-pay | Admitting: Gynecology

## 2013-12-14 ENCOUNTER — Encounter: Payer: Self-pay | Admitting: General Practice

## 2013-12-14 NOTE — Telephone Encounter (Signed)
Patient called and left message that she needs a note faxed to her job. Called patient, no answer- left message that we are trying to return your phone call, please call us back at the clinics

## 2013-12-14 NOTE — Telephone Encounter (Signed)
Patient called back and left message stating she is returning our phone call. Called patient and she states that at her most recent MAU visit she was given a restriction letter for her job but it wasn't noted how long these restrictions will be in place and her job needs that in writing. Spoke to Dr Debroah LoopArnold who stated restrictions would remain in place till next prenatal visit. Letter written. Told patient we will have a new letter waiting for her at our office that she can come by and pickup and at her next visit she needs to speak with the provider to see if the restrictions will continue beyond that and if so she can get an updated letter at that time. Patient verbalized understanding to all and had no further questions

## 2013-12-16 ENCOUNTER — Inpatient Hospital Stay (HOSPITAL_COMMUNITY)
Admission: AD | Admit: 2013-12-16 | Discharge: 2013-12-16 | Disposition: A | Discharge status: Home / Self Care | Payer: Medicaid Other | Source: Ambulatory Visit | Attending: Obstetrics & Gynecology | Admitting: Obstetrics & Gynecology

## 2013-12-16 ENCOUNTER — Encounter: Payer: Self-pay | Admitting: Advanced Practice Midwife

## 2013-12-16 ENCOUNTER — Encounter (HOSPITAL_COMMUNITY): Payer: Self-pay | Admitting: *Deleted

## 2013-12-16 DIAGNOSIS — K5900 Constipation, unspecified: Secondary | ICD-10-CM | POA: Insufficient documentation

## 2013-12-16 DIAGNOSIS — K219 Gastro-esophageal reflux disease without esophagitis: Secondary | ICD-10-CM | POA: Insufficient documentation

## 2013-12-16 DIAGNOSIS — O99611 Diseases of the digestive system complicating pregnancy, first trimester: Secondary | ICD-10-CM

## 2013-12-16 DIAGNOSIS — O4590 Premature separation of placenta, unspecified, unspecified trimester: Secondary | ICD-10-CM

## 2013-12-16 DIAGNOSIS — O208 Other hemorrhage in early pregnancy: Secondary | ICD-10-CM | POA: Insufficient documentation

## 2013-12-16 DIAGNOSIS — O418X10 Other specified disorders of amniotic fluid and membranes, first trimester, not applicable or unspecified: Secondary | ICD-10-CM

## 2013-12-16 DIAGNOSIS — O9989 Other specified diseases and conditions complicating pregnancy, childbirth and the puerperium: Secondary | ICD-10-CM

## 2013-12-16 DIAGNOSIS — O468X1 Other antepartum hemorrhage, first trimester: Secondary | ICD-10-CM

## 2013-12-16 DIAGNOSIS — G43909 Migraine, unspecified, not intractable, without status migrainosus: Secondary | ICD-10-CM

## 2013-12-16 DIAGNOSIS — K589 Irritable bowel syndrome without diarrhea: Secondary | ICD-10-CM | POA: Insufficient documentation

## 2013-12-16 DIAGNOSIS — O99891 Other specified diseases and conditions complicating pregnancy: Secondary | ICD-10-CM | POA: Insufficient documentation

## 2013-12-16 LAB — URINALYSIS, ROUTINE W REFLEX MICROSCOPIC
Bilirubin Urine: NEGATIVE
Glucose, UA: NEGATIVE mg/dL
Ketones, ur: NEGATIVE mg/dL
Leukocytes, UA: NEGATIVE
NITRITE: NEGATIVE
Protein, ur: NEGATIVE mg/dL
SPECIFIC GRAVITY, URINE: 1.01 (ref 1.005–1.030)
UROBILINOGEN UA: 0.2 mg/dL (ref 0.0–1.0)
pH: 7 (ref 5.0–8.0)

## 2013-12-16 LAB — URINE MICROSCOPIC-ADD ON

## 2013-12-16 LAB — POCT PREGNANCY, URINE: Preg Test, Ur: POSITIVE — AB

## 2013-12-16 MED ORDER — FLEET ENEMA 7-19 GM/118ML RE ENEM: 1.0000 | ENEMA | Freq: Once | RECTAL | Status: DC | PRN

## 2013-12-16 MED ORDER — POLYETHYLENE GLYCOL 3350 17 G PO PACK: 17.0000 g | PACK | Freq: Every day | ORAL | Status: DC | PRN

## 2013-12-16 MED ORDER — ACETAMINOPHEN-CODEINE #3 300-30 MG PO TABS: 1.0000 | ORAL_TABLET | ORAL | Status: DC | PRN

## 2013-12-16 NOTE — MAU Note (Addendum)
PT SAYS SHE AWOKE AT  0218-  WHEN  TO B-ROOM-  BLOOD DRIPPING IN TOILET-   AND WHEN SHE WIPED.   IN TRIAGE-  NO PAD ON , NONE IN UNDERWEAR.  FEELS PRESSURE -  FEELS  MOST OF TIME-  FEELS LIKE FULL BLADDER ALL TIME.    ALSO FEELS CONSTIPATED--  SMALL ROUND  STOOL - LAST TIME  WAS Monday  .  GETS PNC - DOWNSTAIRS

## 2013-12-16 NOTE — Discharge Instructions (Signed)
Subchorionic Hematoma A subchorionic hematoma is a gathering of blood between the outer wall of the placenta and the inner wall of the womb (uterus). The placenta is the organ that connects the fetus to the wall of the uterus. The placenta performs the feeding, breathing (oxygen to the fetus), and waste removal (excretory work) of the fetus.  Subchorionic hematoma is the most common abnormality found on a result from ultrasonography done during the first trimester or early second trimester of pregnancy. If there has been little or no vaginal bleeding, early small hematomas usually shrink on their own and do not affect your baby or pregnancy. The blood is gradually absorbed over 1-2 weeks. When bleeding starts later in pregnancy or the hematoma is larger or occurs in an older pregnant woman, the outcome may not be as good. Larger hematomas may get bigger, which increases the chances for miscarriage. Subchorionic hematoma also increases the risk of premature detachment of the placenta from the uterus, preterm (premature) labor, and stillbirth. HOME CARE INSTRUCTIONS   Stay on bed rest if your health care provider recommends this. Although bed rest will not prevent more bleeding or prevent a miscarriage, your health care provider may recommend bed rest until you are advised otherwise.  Avoid heavy lifting (more than 10 lb [4.5 kg]), exercise, sexual intercourse, or douching as directed by your health care provider.  Keep track of the number of pads you use each day and how soaked (saturated) they are. Write down this information.  Do not use tampons.  Keep all follow-up appointments as directed by your health care provider. Your health care provider may ask you to have follow-up blood tests or ultrasound tests or both. SEEK IMMEDIATE MEDICAL CARE IF:   You have severe cramps in your stomach, back, abdomen, or pelvis.  You have a fever.  You pass large clots or tissue. Save any tissue for your health  care provider to look at.  Your bleeding increases or you become lightheaded, feel weak, or have fainting episodes. Document Released: 10/03/2006 Document Revised: 04/08/2013 Document Reviewed: 01/15/2013 [facility] Patient Information 2015 [place], [place]. This information is not intended to replace advice given to you by your health care provider. Make sure you discuss any questions you have with your health care provider.  Constipation Constipation is when a person has fewer than three bowel movements a week, has difficulty having a bowel movement, or has stools that are dry, hard, or larger than normal. As people grow older, constipation is more common. If you try to fix constipation with medicines that make you have a bowel movement (laxatives), the problem may get worse. Long-term laxative use may cause the muscles of the colon to become weak. A low-fiber diet, not taking in enough fluids, and taking certain medicines may make constipation worse.  CAUSES   Certain medicines, such as antidepressants, pain medicine, iron supplements, antacids, and water pills.   Certain diseases, such as diabetes, irritable bowel syndrome (IBS), thyroid disease, or depression.   Not drinking enough water.   Not eating enough fiber-rich foods.   Stress or travel.   Lack of physical activity or exercise.   Ignoring the urge to have a bowel movement.   Using laxatives too much.  SIGNS AND SYMPTOMS   Having fewer than three bowel movements a week.   Straining to have a bowel movement.   Having stools that are hard, dry, or larger than normal.   Feeling full or bloated.   Pain in the  lower abdomen.   Not feeling relief after having a bowel movement.  DIAGNOSIS  Your health care provider will take a medical history and perform a physical exam. Further testing may be done for severe constipation. Some tests may include:  A barium enema X-ray to examine your rectum, colon, and,  sometimes, your small intestine.   A sigmoidoscopy to examine your lower colon.   A colonoscopy to examine your entire colon. TREATMENT  Treatment will depend on the severity of your constipation and what is causing it. Some dietary treatments include drinking more fluids and eating more fiber-rich foods. Lifestyle treatments may include regular exercise. If these diet and lifestyle recommendations do not help, your health care provider may recommend taking over-the-counter laxative medicines to help you have bowel movements. Prescription medicines may be prescribed if over-the-counter medicines do not work.  HOME CARE INSTRUCTIONS   Eat foods that have a lot of fiber, such as fruits, vegetables, whole grains, and beans.  Limit foods high in fat and processed sugars, such as french fries, hamburgers, cookies, candies, and soda.   A fiber supplement may be added to your diet if you cannot get enough fiber from foods.   Drink enough fluids to keep your urine clear or pale yellow.   Exercise regularly or as directed by your health care provider.   Go to the restroom when you have the urge to go. Do not hold it.   Only take over-the-counter or prescription medicines as directed by your health care provider. Do not take other medicines for constipation without talking to your health care provider first.  SEEK IMMEDIATE MEDICAL CARE IF:   You have bright red blood in your stool.   Your constipation lasts for more than 4 days or gets worse.   You have abdominal or rectal pain.   You have thin, pencil-like stools.   You have unexplained weight loss. MAKE SURE YOU:   Understand these instructions.  Will watch your condition.  Will get help right away if you are not doing well or get worse. Document Released: 03/16/2004 Document Revised: 06/23/2013 Document Reviewed: 03/30/2013 [facility] Patient Information 2015 [place], [place]. This information is not intended to replace  advice given to you by your health care provider. Make sure you discuss any questions you have with your health care provider.

## 2013-12-16 NOTE — MAU Provider Note (Signed)
Chief Complaint: No chief complaint on file.  First Provider Initiated Contact with Patient 12/16/13 [phone]     SUBJECTIVE HPI: [name] is a 33 y.o. G3P2002 at 5935w5d by LMP who presents with vaginal bleeding at 2 AM, sensation of bladder pressure (ongoing problem) and constipation. 2 small BMs in past week. Known subchorionic hemorrhage. Also flareup of migraine headaches. Has history of same. Usually takes Tylenol No. 3 with good result. Was prescribed Flexeril last visit in hopes it would be less sedating, but prescription was too expensive. Patient requesting medication for migraines.  No recent intercourse. [facility] Blood type O positive.  Past Medical History  Diagnosis Date  . GERD (gastroesophageal reflux disease)   . Erosive esophagitis     distal esophagus, with low grade bleed 03/2009  . IBS (irritable bowel syndrome)   . Anemia   . History of migraine headaches   . Asthma   . Headache(784.0)    OB History  Gravida Para Term Preterm AB SAB TAB Ectopic Multiple Living  3 2 2       2     # Outcome Date GA Lbr Len/2nd Weight Sex Delivery Anes PTL Lv  3 CUR           2 TRM 11/26/04 3057w0d  3.374 kg (7 lb 7 oz) F SVD EPI  Y     Comments: Hyperemesis  1 TRM 03/03/96 6057w0d  3.26 kg (7 lb 3 oz) F SVD EPI  Y     Comments: low placenta     Past Surgical History  Procedure Laterality Date  . No past surgeries     History   Social History  . Marital Status: Legally Separated    Spouse Name: N/A    Number of Children: N/A  . Years of Education: N/A   Occupational History  . Not on file.   Social History Main Topics  . Smoking status: Never Smoker   . Smokeless tobacco: Never Used  . Alcohol Use: No     Comment: occasional on holidays  . Drug Use: No  . Sexual Activity: Yes    Partners: Male    Birth Control/ Protection: None   Other Topics Concern  . 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   Current Outpatient Prescriptions on File Prior to Encounter  Medication Sig Dispense Refill  . acetaminophen (TYLENOL) 500 MG tablet Take 1,000 mg by mouth once.      . cyclobenzaprine (FLEXERIL) 10 MG tablet Take 1 tablet (10 mg total) by mouth 3 (three) times daily as needed for muscle spasms.  30 tablet  1  . Doxylamine-Pyridoxine (DICLEGIS) 10-10 MG TBEC Two at bedtime, one in the morning, one in the afternoon.  100 tablet  1   Allergies  Allergen Reactions  . Ibuprofen Other (See Comments)    Ulcer flare up    ROS: Pertinent positive items in HPI. Also positive for urinary urgency and frequency since beginning of pregnancy. Negative for fever, chills, abdominal pain, nausea, vomiting, diarrhea, dysuria, flank pain, vaginal discharge, neck stiffness or difficulties with speech or gait.   OBJECTIVE Blood pressure 123/84, pulse 89, temperature 99.1 F (37.3 C), temperature source Oral, resp. rate 20, height 5\' 5"  (1.651 m), weight  79.153 kg (174 lb 8 oz), last menstrual period 09/18/2013. GENERAL: Well-developed, well-nourished female in no acute distress.  HEENT: Normocephalic HEART: normal rate RESP: normal effort ABDOMEN: Mildly distended, non-tender. No CVA tenderness. Fundus 1/SP. EXTREMITIES: Nontender, no edema NEURO: Alert and oriented SPECULUM EXAM: Deferred due to recent exam BIMANUAL: NEFG, cervix long and closed; uterus 13-week size, no adnexal tenderness or masses. Scant brown blood on glove.  FHR 160 by doppler.  LAB RESULTS Results for orders placed during the hospital encounter of 12/16/13 (from the past 24 hour(s))  URINALYSIS, ROUTINE W REFLEX MICROSCOPIC     Status: Abnormal   Collection Time    12/16/13  2:50 AM      Result Value Ref Range   Color, Urine YELLOW  YELLOW    APPearance CLEAR  CLEAR   Specific Gravity, Urine 1.010  1.005 - 1.030   pH 7.0  5.0 - 8.0   Glucose, UA NEGATIVE  NEGATIVE mg/dL   Hgb urine dipstick SMALL (*) NEGATIVE   Bilirubin Urine NEGATIVE  NEGATIVE   Ketones, ur NEGATIVE  NEGATIVE mg/dL   Protein, ur NEGATIVE  NEGATIVE mg/dL   Urobilinogen, UA 0.2  0.0 - 1.0 mg/dL   Nitrite NEGATIVE  NEGATIVE   Leukocytes, UA NEGATIVE  NEGATIVE  URINE MICROSCOPIC-ADD ON     Status: Abnormal   Collection Time    12/16/13  2:50 AM      Result Value Ref Range   Squamous Epithelial / LPF FEW (*) RARE   WBC, UA 0-2  <3 WBC/hpf   RBC / HPF 3-6  <3 RBC/hpf   Bacteria, UA FEW (*) RARE    IMAGING No results found.  MAU COURSE  ASSESSMENT 1. Subchorionic hemorrhage in first trimester   2. Constipation during pregnancy in first trimester   3. Migraine     PLAN Discharge home in stable condition. Increase fluids and fiber. Continue pelvic rest. MiraLAX daily until soft daily bowel movement. If no significant bowel movement in 48 hours use fleets enema.     Follow-up Information   [facility] (As scheduled or , As needed if symptoms worsen)    Specialty:  Obstetrics and Gynecology   Contact information:   [address] [place] [address] [phone]      [facility] with [facility]. (As needed in emergencies)    Contact information:   [address] [id number] [place] [address] [phone]       Medication List         acetaminophen 500 MG tablet  Commonly known as:  TYLENOL  Take 1,000 mg by mouth once.     acetaminophen-codeine 300-30 MG per tablet  Commonly known as:  TYLENOL #3  Take 1 tablet by mouth every 4 (four) hours as needed for moderate pain.     cyclobenzaprine 10 MG tablet  Commonly known as:  FLEXERIL  Take 1 tablet (10 mg total) by mouth 3 (three) times daily as needed for muscle spasms.      Doxylamine-Pyridoxine 10-10 MG Tbec  Commonly known as:  DICLEGIS  Two at bedtime, one in the morning, one in the afternoon.     polyethylene glycol packet  Commonly known as:  MIRALAX / GLYCOLAX  Take 17 g by mouth daily as needed for moderate constipation.     sodium phosphate 7-19 GM/118ML Enem  Place 133 mLs (1 enema total) rectally once as needed for severe  constipation (If no BM after 2 dasy of MiraLax).       McCall, CNM 12/16/2013  4:23 AM

## 2013-12-18 ENCOUNTER — Other Ambulatory Visit (HOSPITAL_COMMUNITY): Payer: Self-pay | Admitting: Maternal and Fetal Medicine

## 2013-12-18 ENCOUNTER — Ambulatory Visit (HOSPITAL_COMMUNITY)
Admission: RE | Admit: 2013-12-18 | Discharge: 2013-12-18 | Disposition: A | Discharge status: Home / Self Care | Payer: Medicaid Other | Source: Ambulatory Visit | Attending: Family Medicine | Admitting: Family Medicine

## 2013-12-18 ENCOUNTER — Encounter (HOSPITAL_COMMUNITY): Payer: Self-pay

## 2013-12-18 VITALS — BP 129/78 | HR 77 | Wt 178.0 lb

## 2013-12-18 DIAGNOSIS — Z3682 Encounter for antenatal screening for nuchal translucency: Secondary | ICD-10-CM

## 2013-12-18 DIAGNOSIS — IMO0002 Reserved for concepts with insufficient information to code with codable children: Secondary | ICD-10-CM

## 2013-12-18 DIAGNOSIS — J45909 Unspecified asthma, uncomplicated: Secondary | ICD-10-CM

## 2013-12-18 DIAGNOSIS — Z0489 Encounter for examination and observation for other specified reasons: Secondary | ICD-10-CM

## 2013-12-18 DIAGNOSIS — Z36 Encounter for antenatal screening of mother: Secondary | ICD-10-CM | POA: Insufficient documentation

## 2013-12-18 DIAGNOSIS — O09521 Supervision of elderly multigravida, first trimester: Secondary | ICD-10-CM

## 2013-12-18 DIAGNOSIS — O99519 Diseases of the respiratory system complicating pregnancy, unspecified trimester: Secondary | ICD-10-CM

## 2013-12-18 NOTE — MAU Provider Note (Signed)

## 2013-12-19 ENCOUNTER — Other Ambulatory Visit: Payer: Self-pay

## 2013-12-21 ENCOUNTER — Inpatient Hospital Stay (HOSPITAL_COMMUNITY)
Admission: AD | Admit: 2013-12-21 | Discharge: 2013-12-21 | Disposition: A | Discharge status: Home / Self Care | Payer: Medicaid Other | Source: Ambulatory Visit | Attending: Obstetrics & Gynecology | Admitting: Obstetrics & Gynecology

## 2013-12-21 ENCOUNTER — Encounter (HOSPITAL_COMMUNITY): Payer: Self-pay | Admitting: Family

## 2013-12-21 DIAGNOSIS — J45909 Unspecified asthma, uncomplicated: Secondary | ICD-10-CM

## 2013-12-21 DIAGNOSIS — O9989 Other specified diseases and conditions complicating pregnancy, childbirth and the puerperium: Secondary | ICD-10-CM

## 2013-12-21 DIAGNOSIS — K208 Other esophagitis without bleeding: Secondary | ICD-10-CM | POA: Insufficient documentation

## 2013-12-21 DIAGNOSIS — K219 Gastro-esophageal reflux disease without esophagitis: Secondary | ICD-10-CM | POA: Insufficient documentation

## 2013-12-21 DIAGNOSIS — O99519 Diseases of the respiratory system complicating pregnancy, unspecified trimester: Secondary | ICD-10-CM

## 2013-12-21 DIAGNOSIS — O210 Mild hyperemesis gravidarum: Secondary | ICD-10-CM

## 2013-12-21 DIAGNOSIS — O99891 Other specified diseases and conditions complicating pregnancy: Secondary | ICD-10-CM

## 2013-12-21 DIAGNOSIS — O4690 Antepartum hemorrhage, unspecified, unspecified trimester: Secondary | ICD-10-CM

## 2013-12-21 DIAGNOSIS — O209 Hemorrhage in early pregnancy, unspecified: Secondary | ICD-10-CM | POA: Insufficient documentation

## 2013-12-21 DIAGNOSIS — K589 Irritable bowel syndrome without diarrhea: Secondary | ICD-10-CM | POA: Insufficient documentation

## 2013-12-21 NOTE — MAU Provider Note (Signed)
History     CSN: [id number]  Arrival date and time: 12/21/13 0415   First Provider Initiated Contact with Patient 12/21/13 0510      Chief Complaint  Patient presents with  . Vaginal Bleeding   Vaginal Bleeding   Pt is a 33 yo G3P2002 at 6622w3d wks IUP here with report of spotting of blood with wiping today.  Denies abdominal pain.  Diagnosed with subchorionic hemorrhage.    Past Medical History  Diagnosis Date  . GERD (gastroesophageal reflux disease)   . Erosive esophagitis     distal esophagus, with low grade bleed 03/2009  . IBS (irritable bowel syndrome)   . Anemia   . History of migraine headaches   . Asthma   . NWGNFAOZ(308.6Headache(784.0)     Past Surgical History  Procedure Laterality Date  . No past surgeries      Family History  Problem Relation Age of Onset  . Heart disease Mother   . Migraines Mother   . Migraines Father   . Asthma Daughter     History  Substance Use Topics  . Smoking status: Never Smoker   . Smokeless tobacco: Never Used  . Alcohol Use: No     Comment: occasional on holidays    Allergies:  Allergies  Allergen Reactions  . Ibuprofen Other (See Comments)    Ulcer flare up     Prescriptions prior to admission  Medication Sig Dispense Refill  . acetaminophen (TYLENOL) 500 MG tablet Take 1,000 mg by mouth once.      Marland Kitchen. acetaminophen-codeine (TYLENOL #3) 300-30 MG per tablet Take 1 tablet by mouth every 4 (four) hours as needed for moderate pain.  20 tablet  0  . cyclobenzaprine (FLEXERIL) 10 MG tablet Take 1 tablet (10 mg total) by mouth 3 (three) times daily as needed for muscle spasms.  30 tablet  1  . Doxylamine-Pyridoxine (DICLEGIS) 10-10 MG TBEC Two at bedtime, one in the morning, one in the afternoon.  100 tablet  1  . polyethylene glycol (MIRALAX / GLYCOLAX) packet Take 17 g by mouth daily as needed for moderate constipation.  14 each  1  . sodium phosphate (FLEET) 7-19 GM/118ML ENEM Place 133 mLs (1 enema total) rectally once as  needed for severe constipation (If no BM after 2 dasy of MiraLax).  1 enema  1    Review of Systems  Genitourinary: Positive for vaginal bleeding.       Vaginal bleeding  All other systems reviewed and are negative.  Physical Exam   Blood pressure 133/87, pulse 91, temperature 98.4 F (36.9 C), temperature source Oral, resp. rate 20, height 5\' 5"  (1.651 m), weight 81.194 kg (179 lb), last menstrual period 09/18/2013.  Physical Exam  Constitutional: She is oriented to person, place, and time. She appears well-developed and well-nourished. No distress.  tearful  HENT:  Head: Normocephalic.  Neck: Normal range of motion. Neck supple.  Cardiovascular: Normal rate, regular rhythm and normal heart sounds.   Respiratory: Effort normal and breath sounds normal.  GI: Soft. There is no tenderness.  Genitourinary: There is bleeding (scant) around the vagina.  Neurological: She is alert and oriented to person, place, and time. She has normal reflexes.  Skin: Skin is warm and dry.   FHR 154 MAU Course  Procedures   Assessment and Plan  33 yo G3P2002 at 4722w3d wks IUP Vaginal Bleeding During Pregnancy  Plan: Discharge to home Provided reassurance Reviewed bleeding precautions in detail Keep scheduled appointment  for the clinic.    [facility],Kory Rains 12/21/2013, 5:19 AM

## 2013-12-21 NOTE — MAU Note (Signed)
PT  SAYS SHE WAS  HERE LAST WEEK-  FOR BLEEDING.   - IT STOPPED -  RESTARTED LAST  NIGHT-   WENT TO B-ROOM-  WHEN SHE WIPED  AND A SPOT  OF DARK BLOOD  IN UNDERWEAR.   HAD U/S  ON Friday-   SAW BLOOD - AND BABY OK.    NO CRAMPS.   FEELS  SOME PAIN IN RLQ.    GETS PNC- DOWNSTAIRS.

## 2013-12-29 ENCOUNTER — Encounter: Payer: Self-pay | Admitting: Obstetrics and Gynecology

## 2013-12-29 ENCOUNTER — Ambulatory Visit (INDEPENDENT_AMBULATORY_CARE_PROVIDER_SITE_OTHER): Payer: Medicaid Other | Admitting: Obstetrics and Gynecology

## 2013-12-29 VITALS — BP 125/80 | HR 80 | Temp 97.0°F | Wt 182.7 lb

## 2013-12-29 DIAGNOSIS — Z3480 Encounter for supervision of other normal pregnancy, unspecified trimester: Secondary | ICD-10-CM

## 2013-12-29 DIAGNOSIS — O209 Hemorrhage in early pregnancy, unspecified: Secondary | ICD-10-CM

## 2013-12-29 LAB — POCT URINALYSIS DIP (DEVICE)
Bilirubin Urine: NEGATIVE
Glucose, UA: NEGATIVE mg/dL
KETONES UR: NEGATIVE mg/dL
Nitrite: NEGATIVE
PROTEIN: NEGATIVE mg/dL
SPECIFIC GRAVITY, URINE: 1.025 (ref 1.005–1.030)
UROBILINOGEN UA: 0.2 mg/dL (ref 0.0–1.0)
pH: 7 (ref 5.0–8.0)

## 2013-12-29 NOTE — Progress Notes (Signed)
NT could not be obtained. NIPS result pending.  H/As improved and has only used Tyl #3 once.  Work has given her a less strenuous job. Note to do no overtime for next 2 wks.  [facility] wk [facility] but no further bleeding. May resume sexual intercourse.  Had sharp abd pain with walking yesterday, none today.

## 2013-12-29 NOTE — Progress Notes (Signed)
Pt reports pressure in lower abdomen, continuous.  Pt is needing clarification on work restrictions.

## 2013-12-31 ENCOUNTER — Telehealth (HOSPITAL_COMMUNITY): Payer: Self-pay | Admitting: MS"

## 2013-12-31 NOTE — Telephone Encounter (Signed)
Attempted to reach patient regarding results of noninvasive prenatal screening (Panorama), which are within normal limits. Left message for patient to return call.   Clydie BraunKaren Jacqulynn Shappell 12/31/2013 10:30 AM

## 2014-01-04 ENCOUNTER — Telehealth (HOSPITAL_COMMUNITY): Payer: Self-pay | Admitting: MS"

## 2014-01-04 NOTE — Telephone Encounter (Signed)
Patient returned call. Called [name] to discuss her cell free fetal DNA test results.  [name] had Panorama testing through [facility].  Testing was offered because of maternal age.   The patient was identified by name and DOB.  We reviewed that these are within normal limits, showing a less than 1 in 10,000 risk for trisomies 21, 18 and 13, and monosomy X (Turner syndrome).  In addition, the risk for triploidy/vanishing twin and sex chromosome trisomies (47,XXX and 47,XXY) was also low risk.  We reviewed that this testing identifies > 99% of pregnancies with trisomy 6221, trisomy 4713, sex chromosome trisomies (47,XXX and 47,XXY), and triploidy. The detection rate for trisomy 18 is 96%.  The detection rate for monosomy X is ~92%.  The false positive rate is <0.1% for all conditions. Testing was also consistent with female fetal sex.  The patient did wish to know fetal sex.  She understands that this testing does not identify all genetic conditions.  All questions were answered to her satisfaction, she was encouraged to call with additional questions or concerns.  Quinn PlowmanKaren Tavon Corriher, MS Patent attorneyCertified Genetic Counselor

## 2014-01-04 NOTE — Telephone Encounter (Signed)
Left message for patient to return call.  [name] 01/04/2014 9:03 AM

## 2014-01-15 ENCOUNTER — Encounter (HOSPITAL_COMMUNITY): Payer: Self-pay | Admitting: *Deleted

## 2014-01-15 ENCOUNTER — Inpatient Hospital Stay (HOSPITAL_COMMUNITY)
Admission: AD | Admit: 2014-01-15 | Discharge: 2014-01-15 | Disposition: A | Discharge status: Home / Self Care | Payer: Medicaid Other | Source: Ambulatory Visit | Attending: Obstetrics & Gynecology | Admitting: Obstetrics & Gynecology

## 2014-01-15 DIAGNOSIS — R109 Unspecified abdominal pain: Secondary | ICD-10-CM | POA: Diagnosis present

## 2014-01-15 DIAGNOSIS — K209 Esophagitis, unspecified without bleeding: Secondary | ICD-10-CM | POA: Insufficient documentation

## 2014-01-15 DIAGNOSIS — K589 Irritable bowel syndrome without diarrhea: Secondary | ICD-10-CM | POA: Insufficient documentation

## 2014-01-15 DIAGNOSIS — K219 Gastro-esophageal reflux disease without esophagitis: Secondary | ICD-10-CM | POA: Insufficient documentation

## 2014-01-15 DIAGNOSIS — K5900 Constipation, unspecified: Secondary | ICD-10-CM | POA: Diagnosis not present

## 2014-01-15 DIAGNOSIS — O99612 Diseases of the digestive system complicating pregnancy, second trimester: Secondary | ICD-10-CM

## 2014-01-15 DIAGNOSIS — O99891 Other specified diseases and conditions complicating pregnancy: Secondary | ICD-10-CM | POA: Diagnosis not present

## 2014-01-15 DIAGNOSIS — O9989 Other specified diseases and conditions complicating pregnancy, childbirth and the puerperium: Principal | ICD-10-CM

## 2014-01-15 HISTORY — DX: Unspecified infectious disease: B99.9

## 2014-01-15 LAB — URINALYSIS, ROUTINE W REFLEX MICROSCOPIC
Bilirubin Urine: NEGATIVE
GLUCOSE, UA: NEGATIVE mg/dL
HGB URINE DIPSTICK: NEGATIVE
Ketones, ur: NEGATIVE mg/dL
Leukocytes, UA: NEGATIVE
Nitrite: NEGATIVE
PH: 6.5 (ref 5.0–8.0)
PROTEIN: NEGATIVE mg/dL
Specific Gravity, Urine: 1.02 (ref 1.005–1.030)
Urobilinogen, UA: 0.2 mg/dL (ref 0.0–1.0)

## 2014-01-15 LAB — LIPASE, BLOOD: LIPASE: 15 U/L (ref 11–59)

## 2014-01-15 LAB — CBC
HEMATOCRIT: 31.2 % — AB (ref 36.0–46.0)
Hemoglobin: 10.8 g/dL — ABNORMAL LOW (ref 12.0–15.0)
MCH: 32.3 pg (ref 26.0–34.0)
MCHC: 34.6 g/dL (ref 30.0–36.0)
MCV: 93.4 fL (ref 78.0–100.0)
Platelets: 260 10*3/uL (ref 150–400)
RBC: 3.34 MIL/uL — ABNORMAL LOW (ref 3.87–5.11)
RDW: 14.4 % (ref 11.5–15.5)
WBC: 7.4 10*3/uL (ref 4.0–10.5)

## 2014-01-15 LAB — AMYLASE: Amylase: 23 U/L (ref 0–105)

## 2014-01-15 MED ORDER — RANITIDINE HCL 150 MG PO TABS: 150.0000 mg | ORAL_TABLET | Freq: Two times a day (BID) | ORAL | Status: DC

## 2014-01-15 MED ORDER — POLYETHYLENE GLYCOL 3350 17 G PO PACK: 17.0000 g | PACK | Freq: Every day | ORAL | Status: DC

## 2014-01-15 MED ORDER — GI COCKTAIL ~~LOC~~
30.0000 mL | ORAL | Status: AC
  Administered 2014-01-15: 30 mL via ORAL
  Filled 2014-01-15: qty 30

## 2014-01-15 MED ORDER — FLEET ENEMA 7-19 GM/118ML RE ENEM
1.0000 | ENEMA | Freq: Once | RECTAL | Status: AC
  Administered 2014-01-15: 1 via RECTAL

## 2014-01-15 MED ORDER — DOCUSATE SODIUM 100 MG PO CAPS: 100.0000 mg | ORAL_CAPSULE | Freq: Two times a day (BID) | ORAL | Status: DC | PRN

## 2014-01-15 NOTE — Discharge Instructions (Signed)
For constipation in pregnancy:    Eat a high fiber diet.   Foods high in fiber include: 1.  Fruits with skin (apples, pears, grapes) 2. Vegetables 3. High fiber cereals like Raisin Bran  4. Prunes or prune juice   Take Colace (docusate sodium) twice per day   Use Miralax daily as needed   Increase PO fluids  Heartburn During Pregnancy  Heartburn is a burning sensation in the chest caused by stomach acid backing up into the esophagus. Heartburn is common in pregnancy because a certain hormone (progesterone) is released when a woman is pregnant. The progesterone hormone may relax the valve that separates the esophagus from the stomach. This allows acid to go up into the esophagus, causing heartburn. Heartburn may also happen in pregnancy because the enlarging uterus pushes up on the stomach, which pushes more acid into the esophagus. This is especially true in the later stages of pregnancy. Heartburn problems usually go away after giving birth. CAUSES  Heartburn is caused by stomach acid backing up into the esophagus. During pregnancy, this may result from various things, including:   The progesterone hormone.  Changing hormone levels.  The growing uterus pushing stomach acid upward.  Large meals.  Certain foods and drinks.  Exercise.  Increased acid production. SIGNS AND SYMPTOMS   Burning pain in the chest or lower throat.  Bitter taste in the mouth.  Coughing. DIAGNOSIS  Your health care provider will typically diagnose heartburn by taking a careful history of your concern. Blood tests may be done to check for a certain type of bacteria that is associated with heartburn. Sometimes, heartburn is diagnosed by prescribing a heartburn medicine to see if the symptoms improve. In some cases, a procedure called an endoscopy may be done. In this procedure, a tube with a light and a camera on the end (endoscope) is used to examine the esophagus and the stomach. TREATMENT  Treatment  will vary depending on the severity of your symptoms. Your health care provider may recommend:  Over-the-counter medicines (antacids, acid reducers) for mild heartburn.  Prescription medicines to decrease stomach acid or to protect your stomach lining.  Certain changes in your diet.  Elevating the head of your bed by putting blocks under the legs. This helps prevent stomach acid from backing up into the esophagus when you are lying down. HOME CARE INSTRUCTIONS   Only take over-the-counter or prescription medicines as directed by your health care provider.  Raise the head of your bed by putting blocks under the legs if instructed to do so by your health care provider. Sleeping with more pillows is not effective because it only changes the position of your head.  Do not exercise right after eating.  Avoid eating 2-3 hours before bed. Do not lie down right after eating.  Eat small meals throughout the day instead of three large meals.  Identify foods and beverages that make your symptoms worse and avoid them. Foods you may want to avoid include:  Peppers.  Chocolate.  High-fat foods, including fried foods.  Spicy foods.  Garlic and onions.  Citrus fruits, including oranges, grapefruit, lemons, and limes.  Food containing tomatoes or tomato products.  Mint.  Carbonated and caffeinated drinks.  Vinegar. SEEK MEDICAL CARE IF:  You have abdominal pain of any kind.  You feel burning in your upper abdomen or chest, especially after eating or lying down.  You have nausea and vomiting.  Your stomach feels upset after you eat. SEEK IMMEDIATE MEDICAL  CARE IF:   You have severe chest pain that goes down your arm or into your jaw or neck.  You feel sweaty, dizzy, or light-headed.  You become short of breath.  You vomit blood.  You have difficulty or pain with swallowing.  You have bloody or black, tarry stools.  You have episodes of heartburn more than 3 times a  week, for more than 2 weeks. MAKE SURE YOU:  Understand these instructions.  Will watch your condition.  Will get help right away if you are not doing well or get worse. Document Released: 06/15/2000 Document Revised: 06/23/2013 Document Reviewed: 02/04/2013 [facility] Patient Information 2015 [place], [place]. This information is not intended to replace advice given to you by your health care provider. Make sure you discuss any questions you have with your health care provider.

## 2014-01-15 NOTE — MAU Note (Signed)
Upper abd pain, started this morning. Was nauseated prior to onset.  Reports wasn't feeling well this morning.

## 2014-01-15 NOTE — MAU Note (Signed)
Fleets enema given. Pt. tolerated

## 2014-01-15 NOTE — MAU Provider Note (Signed)
Chief Complaint: No chief complaint on file.   First Provider Initiated Contact with Patient 01/15/14 0915     SUBJECTIVE HPI: [name] is a 33 y.o. G3P2002 pt of [facility] at [redacted]w[redacted]d  by LMP who presents to maternity admissions reporting onset of abdominal pain described as sharp and "all over my abdomen" and intermittent this morning.  She reports she was feeling nauseous at work, then ate chips and had a sports drink, then started to have pain. She took 1/2 a Tylenol #3, then the pain became worse.  She reports the pain is all over her abdomen, but points to the upper abdomen/epigastric area as the source of her pain.  Of note, she has not had a bowel movement in >1 week.  She reports good fetal movement, denies LOF, vaginal bleeding, vaginal itching/burning, urinary symptoms, h/a, dizziness, or fever/chills.     Past Medical History  Diagnosis Date  . GERD (gastroesophageal reflux disease)   . Erosive esophagitis     distal esophagus, with low grade bleed 03/2009  . IBS (irritable bowel syndrome)   . Anemia   . History of migraine headaches   . Asthma   . Headache(784.0)   . Infection     UTI   Past Surgical History  Procedure Laterality Date  . No past surgeries     History   Social History  . Marital Status: Legally Separated    Spouse Name: N/A    Number of Children: N/A  . Years of Education: N/A   Occupational History  . Not on file.   Social History Main Topics  . Smoking status: Never Smoker   . Smokeless tobacco: Never Used  . Alcohol Use: No     Comment: occasional on holidays  . Drug Use: No  . Sexual Activity: Yes    Partners: Male    Birth Control/ Protection: None   Other Topics Concern  . 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   Current Outpatient  Prescriptions on File Prior to Encounter  Medication Sig Dispense Refill  . acetaminophen-codeine (TYLENOL #3) 300-30 MG per tablet Take 1 tablet by mouth every 4 (four) hours as needed for moderate pain.  20 tablet  0   Allergies  Allergen Reactions  . Ibuprofen Other (See Comments)    Ulcer flare up     ROS: Pertinent items in HPI  OBJECTIVE Blood pressure 112/64, pulse 73, temperature 98.6 F (37 C), temperature source Oral, resp. rate 18, last menstrual period 09/18/2013. GENERAL: Well-developed, well-nourished female in no acute distress.  HEENT: Normocephalic HEART: normal rate RESP: normal effort ABDOMEN: Soft, non-tender EXTREMITIES: Nontender, no edema NEURO: Alert and oriented  Dilation: Closed Effacement (%): Thick Cervical Position: Posterior Exam by:: Leftwich-Kirby, CNM   LAB RESULTS Results for orders placed during the hospital encounter of 01/15/14 (from the past 24 hour(s))  URINALYSIS, ROUTINE W REFLEX MICROSCOPIC     Status: None   Collection Time    01/15/14  9:12 AM      Result Value Ref Range   Color, Urine YELLOW  YELLOW   APPearance CLEAR  CLEAR   Specific Gravity, Urine 1.020  1.005 - 1.030   pH 6.5  5.0 - 8.0   Glucose, UA NEGATIVE  NEGATIVE mg/dL   Hgb  urine dipstick NEGATIVE  NEGATIVE   Bilirubin Urine NEGATIVE  NEGATIVE   Ketones, ur NEGATIVE  NEGATIVE mg/dL   Protein, ur NEGATIVE  NEGATIVE mg/dL   Urobilinogen, UA 0.2  0.0 - 1.0 mg/dL   Nitrite NEGATIVE  NEGATIVE   Leukocytes, UA NEGATIVE  NEGATIVE  CBC     Status: Abnormal   Collection Time    01/15/14  9:35 AM      Result Value Ref Range   WBC 7.4  4.0 - 10.5 K/uL   RBC 3.34 (*) 3.87 - 5.11 MIL/uL   Hemoglobin 10.8 (*) 12.0 - 15.0 g/dL   HCT 04.531.2 (*) 40.936.0 - 81.146.0 %   MCV 93.4  78.0 - 100.0 fL   MCH 32.3  26.0 - 34.0 pg   MCHC 34.6  30.0 - 36.0 g/dL   RDW 91.414.4  78.211.5 - 95.615.5 %   Platelets 260  150 - 400 K/uL  AMYLASE     Status: None   Collection Time    01/15/14  9:35 AM       Result Value Ref Range   Amylase 23  0 - 105 U/L  LIPASE, BLOOD     Status: None   Collection Time    01/15/14  9:35 AM      Result Value Ref Range   Lipase 15  11 - 59 U/L   FHR 164 by doppler  ASSESSMENT 1. Constipation during pregnancy in second trimester   2. Gastroesophageal reflux disease without esophagitis     PLAN GI Cocktail and Fleet enema given in MAU--pt reported significant improvement in symptoms Discharge home High fiber diet and increase PO fluids Colace BID  Miralax daily as needed   Follow-up Information   [facility]   Specialty:  Obstetrics and Gynecology   Contact information:   [address] [place] [id number] [phone]      [facility] with [facility]. (As needed for emergencies)    Contact information:   [address] [id number] [place] [phone]      Sharen CounterLisa Leftwich-Kirby Certified Nurse-Midwife 01/15/2014  12:30 PM

## 2014-01-22 ENCOUNTER — Encounter (HOSPITAL_COMMUNITY): Payer: Self-pay

## 2014-01-22 ENCOUNTER — Ambulatory Visit (HOSPITAL_COMMUNITY)
Admission: RE | Admit: 2014-01-22 | Discharge: 2014-01-22 | Disposition: A | Discharge status: Home / Self Care | Payer: Medicaid Other | Source: Ambulatory Visit | Attending: Obstetrics and Gynecology | Admitting: Obstetrics and Gynecology

## 2014-01-22 DIAGNOSIS — O09521 Supervision of elderly multigravida, first trimester: Secondary | ICD-10-CM

## 2014-01-22 DIAGNOSIS — Z0489 Encounter for examination and observation for other specified reasons: Secondary | ICD-10-CM

## 2014-01-22 DIAGNOSIS — IMO0002 Reserved for concepts with insufficient information to code with codable children: Secondary | ICD-10-CM

## 2014-01-22 DIAGNOSIS — Z1389 Encounter for screening for other disorder: Secondary | ICD-10-CM | POA: Insufficient documentation

## 2014-01-22 DIAGNOSIS — Z363 Encounter for antenatal screening for malformations: Secondary | ICD-10-CM | POA: Insufficient documentation

## 2014-01-22 DIAGNOSIS — O09529 Supervision of elderly multigravida, unspecified trimester: Secondary | ICD-10-CM | POA: Diagnosis not present

## 2014-01-26 ENCOUNTER — Encounter: Payer: Self-pay | Admitting: Obstetrics and Gynecology

## 2014-01-26 ENCOUNTER — Ambulatory Visit (INDEPENDENT_AMBULATORY_CARE_PROVIDER_SITE_OTHER): Payer: Medicaid Other | Admitting: Obstetrics and Gynecology

## 2014-01-26 VITALS — BP 118/75 | HR 91 | Temp 98.3°F | Wt 187.8 lb

## 2014-01-26 DIAGNOSIS — Z3492 Encounter for supervision of normal pregnancy, unspecified, second trimester: Secondary | ICD-10-CM

## 2014-01-26 DIAGNOSIS — O09529 Supervision of elderly multigravida, unspecified trimester: Secondary | ICD-10-CM | POA: Insufficient documentation

## 2014-01-26 DIAGNOSIS — O09522 Supervision of elderly multigravida, second trimester: Secondary | ICD-10-CM

## 2014-01-26 DIAGNOSIS — Z3480 Encounter for supervision of other normal pregnancy, unspecified trimester: Secondary | ICD-10-CM

## 2014-01-26 LAB — POCT URINALYSIS DIP (DEVICE)
BILIRUBIN URINE: NEGATIVE
Glucose, UA: NEGATIVE mg/dL
Ketones, ur: NEGATIVE mg/dL
Leukocytes, UA: NEGATIVE
Nitrite: NEGATIVE
Protein, ur: NEGATIVE mg/dL
Specific Gravity, Urine: 1.025 (ref 1.005–1.030)
UROBILINOGEN UA: 0.2 mg/dL (ref 0.0–1.0)
pH: 6 (ref 5.0–8.0)

## 2014-01-26 NOTE — Patient Instructions (Signed)
Second Trimester of Pregnancy The second trimester is from week 13 through week 28, months 4 through 6. The second trimester is often a time when you feel your best. Your body has also adjusted to being pregnant, and you begin to feel better physically. Usually, morning sickness has lessened or quit completely, you may have more energy, and you may have an increase in appetite. The second trimester is also a time when the fetus is growing rapidly. At the end of the sixth month, the fetus is about 9 inches long and weighs about 1 pounds. You will likely begin to feel the baby move (quickening) between 18 and 20 weeks of the pregnancy. BODY CHANGES Your body goes through many changes during pregnancy. The changes vary from woman to woman.   Your weight will continue to increase. You will notice your lower abdomen bulging out.  You may begin to get stretch marks on your hips, abdomen, and breasts.  You may develop headaches that can be relieved by medicines approved by your health care provider.  You may urinate more often because the fetus is pressing on your bladder.  You may develop or continue to have heartburn as a result of your pregnancy.  You may develop constipation because certain hormones are causing the muscles that push waste through your intestines to slow down.  You may develop hemorrhoids or swollen, bulging veins (varicose veins).  You may have back pain because of the weight gain and pregnancy hormones relaxing your joints between the bones in your pelvis and as a result of a shift in weight and the muscles that support your balance.  Your breasts will continue to grow and be tender.  Your gums may bleed and may be sensitive to brushing and flossing.  Dark spots or blotches (chloasma, mask of pregnancy) may develop on your face. This will likely fade after the baby is born.  A dark line from your belly button to the pubic area (linea nigra) may appear. This will likely fade  after the baby is born.  You may have changes in your hair. These can include thickening of your hair, rapid growth, and changes in texture. Some women also have hair loss during or after pregnancy, or hair that feels dry or thin. Your hair will most likely return to normal after your baby is born. WHAT TO EXPECT AT YOUR PRENATAL VISITS During a routine prenatal visit:  You will be weighed to make sure you and the fetus are growing normally.  Your blood pressure will be taken.  Your abdomen will be measured to track your baby's growth.  The fetal heartbeat will be listened to.  Any test results from the previous visit will be discussed. Your health care provider may ask you:  How you are feeling.  If you are feeling the baby move.  If you have had any abnormal symptoms, such as leaking fluid, bleeding, severe headaches, or abdominal cramping.  If you have any questions. Other tests that may be performed during your second trimester include:  Blood tests that check for:  Low iron levels (anemia).  Gestational diabetes (between 24 and 28 weeks).  Rh antibodies.  Urine tests to check for infections, diabetes, or protein in the urine.  An ultrasound to confirm the proper growth and development of the baby.  An amniocentesis to check for possible genetic problems.  Fetal screens for spina bifida and Down syndrome. HOME CARE INSTRUCTIONS   Avoid all smoking, herbs, alcohol, and unprescribed   drugs. These chemicals affect the formation and growth of the baby.  Follow your health care provider's instructions regarding medicine use. There are medicines that are either safe or unsafe to take during pregnancy.  Exercise only as directed by your health care provider. Experiencing uterine cramps is a good sign to stop exercising.  Continue to eat regular, healthy meals.  Wear a good support bra for breast tenderness.  Do not use hot tubs, steam rooms, or saunas.  Wear your  seat belt at all times when driving.  Avoid raw meat, uncooked cheese, cat litter boxes, and soil used by cats. These carry germs that can cause birth defects in the baby.  Take your prenatal vitamins.  Try taking a stool softener (if your health care provider approves) if you develop constipation. Eat more high-fiber foods, such as fresh vegetables or fruit and whole grains. Drink plenty of fluids to keep your urine clear or pale yellow.  Take warm sitz baths to soothe any pain or discomfort caused by hemorrhoids. Use hemorrhoid cream if your health care provider approves.  If you develop varicose veins, wear support hose. Elevate your feet for 15 minutes, 3-4 times a day. Limit salt in your diet.  Avoid heavy lifting, wear low heel shoes, and practice good posture.  Rest with your legs elevated if you have leg cramps or low back pain.  Visit your dentist if you have not gone yet during your pregnancy. Use a soft toothbrush to brush your teeth and be gentle when you floss.  A sexual relationship may be continued unless your health care provider directs you otherwise.  Continue to go to all your prenatal visits as directed by your health care provider. SEEK MEDICAL CARE IF:   You have dizziness.  You have mild pelvic cramps, pelvic pressure, or nagging pain in the abdominal area.  You have persistent nausea, vomiting, or diarrhea.  You have a bad smelling vaginal discharge.  You have pain with urination. SEEK IMMEDIATE MEDICAL CARE IF:   You have a fever.  You are leaking fluid from your vagina.  You have spotting or bleeding from your vagina.  You have severe abdominal cramping or pain.  You have rapid weight gain or loss.  You have shortness of breath with chest pain.  You notice sudden or extreme swelling of your face, hands, ankles, feet, or legs.  You have not felt your baby move in over an hour.  You have severe headaches that do not go away with  medicine.  You have vision changes. Document Released: 06/12/2001 Document Revised: 06/23/2013 Document Reviewed: 08/19/2012 [employer] Patient Information 2015 [employer]. This information is not intended to replace advice given to you by your health care provider. Make sure you discuss any questions you have with your health care provider.  

## 2014-01-26 NOTE — Progress Notes (Signed)
Discussed face breaking out and information about circumcision. NIPS negative.

## 2014-01-26 NOTE — Progress Notes (Signed)
Pt is concerned about facial breakouts, wants to discuss circumcision today.

## 2014-01-28 ENCOUNTER — Encounter: Payer: Self-pay | Admitting: *Deleted

## 2014-02-09 ENCOUNTER — Encounter: Payer: Self-pay | Admitting: *Deleted

## 2014-02-09 ENCOUNTER — Telehealth: Payer: Self-pay | Admitting: *Deleted

## 2014-02-09 NOTE — Telephone Encounter (Signed)
Attempted to contact patient to clarify FMLA paperwork request.  Pt was on 8hr/day restriction through 7/15/215 per [employer].  Need clarification is this paperwork for those previous dates or currently.  If current, no limit on hours at work defined, will need to discuss with provider at her next visit.  Unable to reach patient, left message for patient to call and clarify her request.

## 2014-02-15 NOTE — Telephone Encounter (Signed)
Called patient to clarify papers dropped off recently. Patient states she is still only working 8 hr a day because that's all she can do with getting bigger and her feet swelling and her job wants the paperwork filled out stating she cannot work more than 8 hours a day. Told patient she will need to discuss this with a provider at her next visit. Patient verbalized understanding and stated yeah I know that's what I told my job. Patient had no other questions

## 2014-02-23 ENCOUNTER — Ambulatory Visit (INDEPENDENT_AMBULATORY_CARE_PROVIDER_SITE_OTHER): Payer: Medicaid Other | Admitting: Family

## 2014-02-23 VITALS — BP 121/73 | HR 83 | Temp 99.2°F | Wt 197.2 lb

## 2014-02-23 DIAGNOSIS — O9989 Other specified diseases and conditions complicating pregnancy, childbirth and the puerperium: Secondary | ICD-10-CM

## 2014-02-23 DIAGNOSIS — O99519 Diseases of the respiratory system complicating pregnancy, unspecified trimester: Principal | ICD-10-CM

## 2014-02-23 DIAGNOSIS — O99891 Other specified diseases and conditions complicating pregnancy: Secondary | ICD-10-CM

## 2014-02-23 DIAGNOSIS — J45909 Unspecified asthma, uncomplicated: Secondary | ICD-10-CM

## 2014-02-23 LAB — POCT URINALYSIS DIP (DEVICE)
BILIRUBIN URINE: NEGATIVE
Glucose, UA: NEGATIVE mg/dL
Ketones, ur: NEGATIVE mg/dL
Leukocytes, UA: NEGATIVE
Nitrite: NEGATIVE
PH: 7 (ref 5.0–8.0)
Protein, ur: NEGATIVE mg/dL
Specific Gravity, Urine: 1.015 (ref 1.005–1.030)
Urobilinogen, UA: 0.2 mg/dL (ref 0.0–1.0)

## 2014-02-23 NOTE — Progress Notes (Signed)
Reports needing to use albuterol inhaler 2x/day.  Reports pelvic pressure with walking and swelling of feet.  Advised to elevate feet when possible.  Desires to work no more than 6-8 hours per day due to above complaints. Explained it's not a complication of pregnancy and normal bodily changes.

## 2014-02-23 NOTE — Progress Notes (Signed)
Patient requests albuterol inhaler RX-- states she doesn't have it anymore but needs it.  C/o of "lots of pelvic pressure especially after walking." Edema in ankles and feet.  Patient has questions regarding FMLA paperwork and work restrictions-- provider to advise.

## 2014-02-24 ENCOUNTER — Encounter (HOSPITAL_COMMUNITY): Payer: Self-pay | Admitting: *Deleted

## 2014-02-24 ENCOUNTER — Inpatient Hospital Stay (HOSPITAL_COMMUNITY)
Admission: AD | Admit: 2014-02-24 | Discharge: 2014-02-24 | Disposition: A | Discharge status: Home / Self Care | Payer: Medicaid Other | Source: Ambulatory Visit | Attending: Obstetrics & Gynecology | Admitting: Obstetrics & Gynecology

## 2014-02-24 DIAGNOSIS — K219 Gastro-esophageal reflux disease without esophagitis: Secondary | ICD-10-CM | POA: Diagnosis not present

## 2014-02-24 DIAGNOSIS — O219 Vomiting of pregnancy, unspecified: Secondary | ICD-10-CM

## 2014-02-24 DIAGNOSIS — O99019 Anemia complicating pregnancy, unspecified trimester: Secondary | ICD-10-CM | POA: Insufficient documentation

## 2014-02-24 DIAGNOSIS — D649 Anemia, unspecified: Secondary | ICD-10-CM | POA: Insufficient documentation

## 2014-02-24 DIAGNOSIS — O99012 Anemia complicating pregnancy, second trimester: Secondary | ICD-10-CM

## 2014-02-24 DIAGNOSIS — O212 Late vomiting of pregnancy: Secondary | ICD-10-CM | POA: Insufficient documentation

## 2014-02-24 LAB — URINALYSIS, ROUTINE W REFLEX MICROSCOPIC
Bilirubin Urine: NEGATIVE
Glucose, UA: NEGATIVE mg/dL
HGB URINE DIPSTICK: NEGATIVE
Ketones, ur: NEGATIVE mg/dL
Leukocytes, UA: NEGATIVE
Nitrite: NEGATIVE
PH: 8 (ref 5.0–8.0)
Protein, ur: NEGATIVE mg/dL
SPECIFIC GRAVITY, URINE: 1.01 (ref 1.005–1.030)
UROBILINOGEN UA: 0.2 mg/dL (ref 0.0–1.0)

## 2014-02-24 LAB — CBC
HCT: 31 % — ABNORMAL LOW (ref 36.0–46.0)
HEMOGLOBIN: 10.8 g/dL — AB (ref 12.0–15.0)
MCH: 32.5 pg (ref 26.0–34.0)
MCHC: 34.8 g/dL (ref 30.0–36.0)
MCV: 93.4 fL (ref 78.0–100.0)
PLATELETS: 283 10*3/uL (ref 150–400)
RBC: 3.32 MIL/uL — AB (ref 3.87–5.11)
RDW: 13.4 % (ref 11.5–15.5)
WBC: 9.1 10*3/uL (ref 4.0–10.5)

## 2014-02-24 LAB — RAPID URINE DRUG SCREEN, HOSP PERFORMED
AMPHETAMINES: NOT DETECTED
Barbiturates: NOT DETECTED
Benzodiazepines: NOT DETECTED
Cocaine: NOT DETECTED
OPIATES: NOT DETECTED
Tetrahydrocannabinol: NOT DETECTED

## 2014-02-24 LAB — GLUCOSE, CAPILLARY: Glucose-Capillary: 78 mg/dL (ref 70–99)

## 2014-02-24 MED ORDER — PRENATAL VITAMIN 27-0.8 MG PO TABS: 1.0000 | ORAL_TABLET | Freq: Every day | ORAL | Status: DC

## 2014-02-24 MED ORDER — ONDANSETRON 4 MG PO TBDP
4.0000 mg | ORAL_TABLET | Freq: Once | ORAL | Status: AC
  Administered 2014-02-24: 4 mg via ORAL
  Filled 2014-02-24: qty 1

## 2014-02-24 MED ORDER — ONDANSETRON 4 MG PO TBDP: 4.0000 mg | ORAL_TABLET | Freq: Three times a day (TID) | ORAL | Status: DC | PRN

## 2014-02-24 NOTE — MAU Note (Signed)
Feels like she is getting a headache, has migraines

## 2014-02-24 NOTE — MAU Provider Note (Signed)
History     CSN: [id number]  Arrival date and time: 02/24/14 1049   None     Chief Complaint  Patient presents with  . Nausea   HPI  [name] is a 33 y.o. female G3P2002 at [redacted]w[redacted]d who presents with Nausea that started this morning. The patient was seen yesterday in the clinic for her routine prenatal appointment; she was feeling fine then. She denies vomiting. At 0500 she ate two pop tarts and drank orange juice.   OB History   Grav Para Term Preterm Abortions TAB SAB Ect Mult Living   Past Medical History  Diagnosis Date  . GERD (gastroesophageal reflux disease)   . Erosive esophagitis     distal esophagus, with low grade bleed 03/2009  . IBS (irritable bowel syndrome)   . Anemia   . History of migraine headaches   . Asthma   . Headache(784.0)   . Infection     UTI    Past Surgical History  Procedure Laterality Date  . No past surgeries      Family History  Problem Relation Age of Onset  . Migraines Mother   . Asthma Mother   . Migraines Father   . Heart disease Father   . Asthma Daughter   . Cancer Maternal Grandmother     History  Substance Use Topics  . Smoking status: Never Smoker   . Smokeless tobacco: Never Used  . Alcohol Use: No     Comment: occasional on holidays    Allergies:  Allergies  Allergen Reactions  . Ibuprofen Other (See Comments)    Ulcer flare up     Prescriptions prior to admission  Medication Sig Dispense Refill  . acetaminophen-codeine (TYLENOL #3) 300-30 MG per tablet Take 1 tablet by mouth every 4 (four) hours as needed for moderate pain.  20 tablet  0  . albuterol (PROVENTIL HFA;VENTOLIN HFA) 108 (90 BASE) MCG/ACT inhaler Inhale 1-2 puffs into the lungs every 6 (six) hours as needed for wheezing or shortness of breath.      . docusate sodium (COLACE) 100 MG capsule Take 1 capsule (100 mg total) by mouth 2 (two) times daily as needed.  30 capsule  2  . polyethylene glycol (MIRALAX /  GLYCOLAX) packet Take 17 g by mouth daily.  14 each  0  . ranitidine (ZANTAC) 150 MG tablet Take 1 tablet (150 mg total) by mouth 2 (two) times daily.  60 tablet  3   Results for orders placed during the hospital encounter of 02/24/14 (from the past 48 hour(s))  URINALYSIS, ROUTINE W REFLEX MICROSCOPIC     Status: Abnormal   Collection Time    02/24/14 10:55 AM      Result Value Ref Range   Color, Urine YELLOW  YELLOW   APPearance HAZY (*) CLEAR   Specific Gravity, Urine 1.010  1.005 - 1.030   pH 8.0  5.0 - 8.0   Glucose, UA NEGATIVE  NEGATIVE mg/dL   Hgb urine dipstick NEGATIVE  NEGATIVE   Bilirubin Urine NEGATIVE  NEGATIVE   Ketones, ur NEGATIVE  NEGATIVE mg/dL   Protein, ur NEGATIVE  NEGATIVE mg/dL   Urobilinogen, UA 0.2  0.0 - 1.0 mg/dL   Nitrite NEGATIVE  NEGATIVE   Leukocytes, UA NEGATIVE  NEGATIVE   Comment: MICROSCOPIC NOT DONE ON URINES WITH NEGATIVE PROTEIN, BLOOD, LEUKOCYTES, NITRITE, OR GLUCOSE <1000 mg/dL.  GLUCOSE, CAPILLARY     Status: None   Collection Time    02/24/14 11:22 AM      Result Value Ref Range   Glucose-Capillary 78  70 - 99 mg/dL  CBC     Status: Abnormal   Collection Time    02/24/14 11:24 AM      Result Value Ref Range   WBC 9.1  4.0 - 10.5 K/uL   RBC 3.32 (*) 3.87 - 5.11 MIL/uL   Hemoglobin 10.8 (*) 12.0 - 15.0 g/dL   HCT 52.8 (*) 41.3 - 24.4 %   MCV 93.4  78.0 - 100.0 fL   MCH 32.5  26.0 - 34.0 pg   MCHC 34.8  30.0 - 36.0 g/dL   RDW 01.0  27.2 - 53.6 %   Platelets 283  150 - 400 K/uL    Review of Systems  Constitutional: Negative for fever and chills.  Gastrointestinal: Positive for nausea. Negative for abdominal pain, diarrhea and constipation.  Genitourinary: Negative for dysuria, urgency, frequency and hematuria.       No vaginal discharge. No vaginal bleeding. No dysuria.    Physical Exam   Blood pressure 127/64, pulse 79, temperature 98.8 F (37.1 C), temperature source Oral, resp. rate 18, last menstrual period  09/18/2013.  Physical Exam  Constitutional: She is oriented to person, place, and time. She appears well-developed and well-nourished.  Non-toxic appearance. She does not have a sickly appearance. She does not appear ill. No distress.  HENT:  Head: Normocephalic.  Eyes: Pupils are equal, round, and reactive to light.  Neck: Neck supple.  Respiratory: Effort normal.  GI: Soft.  Musculoskeletal: Normal range of motion.  Neurological: She is alert and oriented to person, place, and time.  Skin: Skin is warm. She is not diaphoretic.  Psychiatric: Her behavior is normal.    MAU Course  Procedures None  MDM +fht UA Zofran 4 mg ODT Patient tolerated PO fluids CBC shows anemia; I will prescribe a prenatal vitamin with iron.  UDS- pending  Assessment and Plan   A:  1. Nausea/vomiting in pregnancy   2. Anemia affecting pregnancy in second trimester    P:  Discharge home in stable condition RX: prenatal vitamin        Zofran  Return to MAU as needed, if symptoms worsen Keep your appointment in the clinic as scheduled Stool softener recommended, as directed on the bottle.   Iona Hansen Rasch, NP  02/24/2014, 11:10 AM

## 2014-02-24 NOTE — Discharge Instructions (Signed)
Anemia, Nonspecific Anemia is a condition in which the concentration of red blood cells or hemoglobin in the blood is below normal. Hemoglobin is a substance in red blood cells that carries oxygen to the tissues of the body. Anemia results in not enough oxygen reaching these tissues.  CAUSES  Common causes of anemia include:   Excessive bleeding. Bleeding may be internal or external. This includes excessive bleeding from periods (in women) or from the intestine.   Poor nutrition.   Chronic kidney, thyroid, and liver disease.  Bone marrow disorders that decrease red blood cell production.  Cancer and treatments for cancer.  HIV, AIDS, and their treatments.  Spleen problems that increase red blood cell destruction.  Blood disorders.  Excess destruction of red blood cells due to infection, medicines, and autoimmune disorders. SIGNS AND SYMPTOMS   Minor weakness.   Dizziness.   Headache.  Palpitations.   Shortness of breath, especially with exercise.   Paleness.  Cold sensitivity.  Indigestion.  Nausea.  Difficulty sleeping.  Difficulty concentrating. Symptoms may occur suddenly or they may develop slowly.  DIAGNOSIS  Additional blood tests are often needed. These help your health care provider determine the best treatment. Your health care provider will check your stool for blood and look for other causes of blood loss.  TREATMENT  Treatment varies depending on the cause of the anemia. Treatment can include:   Supplements of iron, vitamin B12, or folic acid.   Hormone medicines.   A blood transfusion. This may be needed if blood loss is severe.   Hospitalization. This may be needed if there is significant continual blood loss.   Dietary changes.  Spleen removal. HOME CARE INSTRUCTIONS Keep all follow-up appointments. It often takes many weeks to correct anemia, and having your health care provider check on your condition and your response to  treatment is very important. SEEK IMMEDIATE MEDICAL CARE IF:   You develop extreme weakness, shortness of breath, or chest pain.   You become dizzy or have trouble concentrating.  You develop heavy vaginal bleeding.   You develop a rash.   You have bloody or black, tarry stools.   You faint.   You vomit up blood.   You vomit repeatedly.   You have abdominal pain.  You have a fever or persistent symptoms for more than 2-3 days.   You have a fever and your symptoms suddenly get worse.   You are dehydrated.  MAKE SURE YOU:  Understand these instructions.  Will watch your condition.  Will get help right away if you are not doing well or get worse. Document Released: 07/26/2004 Document Revised: 02/18/2013 Document Reviewed: 12/12/2012 ExitCare Patient Information 2015 Exit[employer]. This information is not intended to replace advice given to you by your health care provider. Make sure you discuss any questions you have with your health care provider.  

## 2014-02-24 NOTE — MAU Note (Signed)
Woke up this morning, felt a little nauseous and weak, had a poptart and oj.  Went on to work, continued to feel sick and weak- was sent home- called office told to come here.

## 2014-02-25 ENCOUNTER — Telehealth: Payer: Self-pay | Admitting: *Deleted

## 2014-02-25 DIAGNOSIS — O99519 Diseases of the respiratory system complicating pregnancy, unspecified trimester: Principal | ICD-10-CM

## 2014-02-25 DIAGNOSIS — J45909 Unspecified asthma, uncomplicated: Secondary | ICD-10-CM

## 2014-02-25 MED ORDER — ALBUTEROL SULFATE HFA 108 (90 BASE) MCG/ACT IN AERS: 1.0000 | INHALATION_SPRAY | Freq: Four times a day (QID) | RESPIRATORY_TRACT | Status: DC | PRN

## 2014-02-25 NOTE — Telephone Encounter (Signed)
Patient called requesting refill on her albuterol inhaler. I got a verbal order from Dr. Macon Large to send in her inhaler. Patient aware and will pick up.

## 2014-03-23 ENCOUNTER — Ambulatory Visit (INDEPENDENT_AMBULATORY_CARE_PROVIDER_SITE_OTHER): Payer: Medicaid Other | Admitting: Obstetrics and Gynecology

## 2014-03-23 ENCOUNTER — Encounter: Payer: Self-pay | Admitting: Obstetrics and Gynecology

## 2014-03-23 VITALS — BP 119/71 | HR 92 | Wt 203.1 lb

## 2014-03-23 DIAGNOSIS — O99891 Other specified diseases and conditions complicating pregnancy: Secondary | ICD-10-CM

## 2014-03-23 DIAGNOSIS — O418X10 Other specified disorders of amniotic fluid and membranes, first trimester, not applicable or unspecified: Secondary | ICD-10-CM

## 2014-03-23 DIAGNOSIS — G43C0 Periodic headache syndromes in child or adult, not intractable: Secondary | ICD-10-CM

## 2014-03-23 DIAGNOSIS — O99519 Diseases of the respiratory system complicating pregnancy, unspecified trimester: Secondary | ICD-10-CM

## 2014-03-23 DIAGNOSIS — O99611 Diseases of the digestive system complicating pregnancy, first trimester: Secondary | ICD-10-CM

## 2014-03-23 DIAGNOSIS — O4590 Premature separation of placenta, unspecified, unspecified trimester: Secondary | ICD-10-CM

## 2014-03-23 DIAGNOSIS — O09522 Supervision of elderly multigravida, second trimester: Secondary | ICD-10-CM

## 2014-03-23 DIAGNOSIS — K5900 Constipation, unspecified: Secondary | ICD-10-CM

## 2014-03-23 DIAGNOSIS — O9989 Other specified diseases and conditions complicating pregnancy, childbirth and the puerperium: Secondary | ICD-10-CM

## 2014-03-23 DIAGNOSIS — J45909 Unspecified asthma, uncomplicated: Secondary | ICD-10-CM

## 2014-03-23 DIAGNOSIS — O468X1 Other antepartum hemorrhage, first trimester: Secondary | ICD-10-CM

## 2014-03-23 DIAGNOSIS — G43809 Other migraine, not intractable, without status migrainosus: Secondary | ICD-10-CM

## 2014-03-23 DIAGNOSIS — O09529 Supervision of elderly multigravida, unspecified trimester: Secondary | ICD-10-CM

## 2014-03-23 LAB — POCT URINALYSIS DIP (DEVICE)
BILIRUBIN URINE: NEGATIVE
Glucose, UA: NEGATIVE mg/dL
KETONES UR: NEGATIVE mg/dL
LEUKOCYTES UA: NEGATIVE
Nitrite: NEGATIVE
Protein, ur: NEGATIVE mg/dL
Specific Gravity, Urine: 1.02 (ref 1.005–1.030)
Urobilinogen, UA: 0.2 mg/dL (ref 0.0–1.0)
pH: 6.5 (ref 5.0–8.0)

## 2014-03-23 MED ORDER — ACETAMINOPHEN-CODEINE #3 300-30 MG PO TABS: 1.0000 | ORAL_TABLET | ORAL | Status: DC | PRN

## 2014-03-23 NOTE — Progress Notes (Signed)
Pt reports that she is having headaches. Lost her tylenol3.

## 2014-03-23 NOTE — Progress Notes (Signed)
Works at [employer] and doing well except states she is having 2 migraines/wk and lost the Rx given for Tylenol with codeine. Tylenol ineffective. Denies aura, vomiting and says she had migraines prepregnancy. Rx #15 NR I hr GCT today.

## 2014-03-23 NOTE — Patient Instructions (Signed)
Second Trimester of Pregnancy The second trimester is from week 13 through week 28, months 4 through 6. The second trimester is often a time when you feel your best. Your body has also adjusted to being pregnant, and you begin to feel better physically. Usually, morning sickness has lessened or quit completely, you may have more energy, and you may have an increase in appetite. The second trimester is also a time when the fetus is growing rapidly. At the end of the sixth month, the fetus is about 9 inches long and weighs about 1 pounds. You will likely begin to feel the baby move (quickening) between 18 and 20 weeks of the pregnancy. BODY CHANGES Your body goes through many changes during pregnancy. The changes vary from woman to woman.   Your weight will continue to increase. You will notice your lower abdomen bulging out.  You may begin to get stretch marks on your hips, abdomen, and breasts.  You may develop headaches that can be relieved by medicines approved by your health care provider.  You may urinate more often because the fetus is pressing on your bladder.  You may develop or continue to have heartburn as a result of your pregnancy.  You may develop constipation because certain hormones are causing the muscles that push waste through your intestines to slow down.  You may develop hemorrhoids or swollen, bulging veins (varicose veins).  You may have back pain because of the weight gain and pregnancy hormones relaxing your joints between the bones in your pelvis and as a result of a shift in weight and the muscles that support your balance.  Your breasts will continue to grow and be tender.  Your gums may bleed and may be sensitive to brushing and flossing.  Dark spots or blotches (chloasma, mask of pregnancy) may develop on your face. This will likely fade after the baby is born.  A dark line from your belly button to the pubic area (linea nigra) may appear. This will likely fade  after the baby is born.  You may have changes in your hair. These can include thickening of your hair, rapid growth, and changes in texture. Some women also have hair loss during or after pregnancy, or hair that feels dry or thin. Your hair will most likely return to normal after your baby is born. WHAT TO EXPECT AT YOUR PRENATAL VISITS During a routine prenatal visit:  You will be weighed to make sure you and the fetus are growing normally.  Your blood pressure will be taken.  Your abdomen will be measured to track your baby's growth.  The fetal heartbeat will be listened to.  Any test results from the previous visit will be discussed. Your health care provider may ask you:  How you are feeling.  If you are feeling the baby move.  If you have had any abnormal symptoms, such as leaking fluid, bleeding, severe headaches, or abdominal cramping.  If you have any questions. Other tests that may be performed during your second trimester include:  Blood tests that check for:  Low iron levels (anemia).  Gestational diabetes (between 24 and 28 weeks).  Rh antibodies.  Urine tests to check for infections, diabetes, or protein in the urine.  An ultrasound to confirm the proper growth and development of the baby.  An amniocentesis to check for possible genetic problems.  Fetal screens for spina bifida and Down syndrome. HOME CARE INSTRUCTIONS   Avoid all smoking, herbs, alcohol, and unprescribed   drugs. These chemicals affect the formation and growth of the baby.  Follow your health care provider's instructions regarding medicine use. There are medicines that are either safe or unsafe to take during pregnancy.  Exercise only as directed by your health care provider. Experiencing uterine cramps is a good sign to stop exercising.  Continue to eat regular, healthy meals.  Wear a good support bra for breast tenderness.  Do not use hot tubs, steam rooms, or saunas.  Wear your  seat belt at all times when driving.  Avoid raw meat, uncooked cheese, cat litter boxes, and soil used by cats. These carry germs that can cause birth defects in the baby.  Take your prenatal vitamins.  Try taking a stool softener (if your health care provider approves) if you develop constipation. Eat more high-fiber foods, such as fresh vegetables or fruit and whole grains. Drink plenty of fluids to keep your urine clear or pale yellow.  Take warm sitz baths to soothe any pain or discomfort caused by hemorrhoids. Use hemorrhoid cream if your health care provider approves.  If you develop varicose veins, wear support hose. Elevate your feet for 15 minutes, 3-4 times a day. Limit salt in your diet.  Avoid heavy lifting, wear low heel shoes, and practice good posture.  Rest with your legs elevated if you have leg cramps or low back pain.  Visit your dentist if you have not gone yet during your pregnancy. Use a soft toothbrush to brush your teeth and be gentle when you floss.  A sexual relationship may be continued unless your health care provider directs you otherwise.  Continue to go to all your prenatal visits as directed by your health care provider. SEEK MEDICAL CARE IF:   You have dizziness.  You have mild pelvic cramps, pelvic pressure, or nagging pain in the abdominal area.  You have persistent nausea, vomiting, or diarrhea.  You have a bad smelling vaginal discharge.  You have pain with urination. SEEK IMMEDIATE MEDICAL CARE IF:   You have a fever.  You are leaking fluid from your vagina.  You have spotting or bleeding from your vagina.  You have severe abdominal cramping or pain.  You have rapid weight gain or loss.  You have shortness of breath with chest pain.  You notice sudden or extreme swelling of your face, hands, ankles, feet, or legs.  You have not felt your baby move in over an hour.  You have severe headaches that do not go away with  medicine.  You have vision changes. Document Released: 06/12/2001 Document Revised: 06/23/2013 Document Reviewed: 08/19/2012 [employer] Patient Information 2015 [employer]. This information is not intended to replace advice given to you by your health care provider. Make sure you discuss any questions you have with your health care provider.  

## 2014-03-24 LAB — CBC
HEMATOCRIT: 29.6 % — AB (ref 36.0–46.0)
Hemoglobin: 10.2 g/dL — ABNORMAL LOW (ref 12.0–15.0)
MCH: 31.7 pg (ref 26.0–34.0)
MCHC: 34.5 g/dL (ref 30.0–36.0)
MCV: 91.9 fL (ref 78.0–100.0)
Platelets: 327 10*3/uL (ref 150–400)
RBC: 3.22 MIL/uL — AB (ref 3.87–5.11)
RDW: 13.5 % (ref 11.5–15.5)
WBC: 10 10*3/uL (ref 4.0–10.5)

## 2014-03-24 LAB — RPR

## 2014-03-24 LAB — GLUCOSE TOLERANCE, 1 HOUR (50G) W/O FASTING: Glucose, 1 Hour GTT: 77 mg/dL (ref 70–140)

## 2014-04-05 ENCOUNTER — Inpatient Hospital Stay (HOSPITAL_COMMUNITY)
Admission: AD | Admit: 2014-04-05 | Discharge: 2014-04-05 | Disposition: A | Discharge status: Home / Self Care | Payer: Medicaid Other | Source: Ambulatory Visit | Attending: Obstetrics and Gynecology | Admitting: Obstetrics and Gynecology

## 2014-04-05 ENCOUNTER — Encounter (HOSPITAL_COMMUNITY): Payer: Self-pay | Admitting: *Deleted

## 2014-04-05 DIAGNOSIS — R109 Unspecified abdominal pain: Secondary | ICD-10-CM | POA: Diagnosis present

## 2014-04-05 DIAGNOSIS — O26899 Other specified pregnancy related conditions, unspecified trimester: Secondary | ICD-10-CM

## 2014-04-05 DIAGNOSIS — O26893 Other specified pregnancy related conditions, third trimester: Secondary | ICD-10-CM

## 2014-04-05 DIAGNOSIS — K429 Umbilical hernia without obstruction or gangrene: Secondary | ICD-10-CM | POA: Diagnosis not present

## 2014-04-05 DIAGNOSIS — O9989 Other specified diseases and conditions complicating pregnancy, childbirth and the puerperium: Secondary | ICD-10-CM | POA: Diagnosis not present

## 2014-04-05 NOTE — MAU Note (Signed)
Pt state that navel pain started at 2200 on 04/04/2014. Pt denies SROM, denies vag bleeding, denies UC's and states positive fetal movement. Denies any risk factors for pregnancy.

## 2014-04-05 NOTE — MAU Provider Note (Signed)
Attestation of Attending Supervision of Advanced Practitioner (CNM/NP): Evaluation and management procedures were performed by the Advanced Practitioner under my supervision and collaboration.  I have reviewed the Advanced Practitioner's note and chart, and I agree with the management and plan.  Donie Lemelin 04/05/2014 7:57 AM

## 2014-04-05 NOTE — MAU Provider Note (Signed)
History     CSN: [id number]  Arrival date and time: 04/05/14 0439   None    CC: navel pain  HPI [name] is a 33 y.o. G3P2002 at 631w3d by LMP confirmed by 5735w3d US who presents to MAU for "navel pain" that started around 9pm last night. She was not doing anything in particular when the pain started. States it is located only around her navel, feels like squeezing/tight, comes and goes and sometimes made worse with certain positions/movement. She is unable to say how often the pain comes and goes, but it lasts about 1 minute at a time. She denies feelings of contractions but does say it has been a long time since her last child. She reports +FM, no VB, no LOF, no CP, no SOB, some heartburn, no diarrhea/constipation.  Pt had intercourse within last 24 hours.   OB History   Grav Para Term Preterm Abortions TAB SAB Ect Mult Living   3 2 2       2     1997 - SVD 2538w0d 2006 - SVD 3738w0d  Past Medical History  Diagnosis Date  . GERD (gastroesophageal reflux disease)   . Erosive esophagitis     distal esophagus, with low grade bleed 03/2009  . IBS (irritable bowel syndrome)   . Anemia   . History of migraine headaches   . Asthma   . Headache(784.0)   . Infection     UTI    Past Surgical History  Procedure Laterality Date  . No past surgeries      Family History  Problem Relation Age of Onset  . Migraines Mother   . Asthma Mother   . Migraines Father   . Heart disease Father   . Asthma Daughter   . Cancer Maternal Grandmother     History  Substance Use Topics  . Smoking status: Never Smoker   . Smokeless tobacco: Never Used  . Alcohol Use: No     Comment: occasional on holidays    Allergies:  Allergies  Allergen Reactions  . Ibuprofen Other (See Comments)    Ulcer flare up     Prescriptions prior to admission  Medication Sig Dispense Refill  . acetaminophen-codeine (TYLENOL #3) 300-30 MG per tablet Take 1 tablet by mouth every 4 (four) hours as  needed for moderate pain.  15 tablet  0  . albuterol (PROVENTIL HFA;VENTOLIN HFA) 108 (90 BASE) MCG/ACT inhaler Inhale 1-2 puffs into the lungs every 6 (six) hours as needed for wheezing or shortness of breath.  1 Inhaler  3  . Prenatal Vit-Fe Fumarate-FA (PRENATAL VITAMIN) 27-0.8 MG TABS Take 1 tablet by mouth daily.  30 tablet  3  . diphenhydrAMINE (BENADRYL) 12.5 MG/5ML liquid Take 25 mg by mouth at bedtime as needed for allergies.      Marland Kitchen. ondansetron (ZOFRAN ODT) 4 MG disintegrating tablet Take 1 tablet (4 mg total) by mouth every 8 (eight) hours as needed for nausea or vomiting.  20 tablet  0    Review of Systems  Constitutional: Negative for fever and chills.  Eyes: Negative for blurred vision.  Respiratory: Negative for shortness of breath.   Cardiovascular: Negative for chest pain.  Gastrointestinal: Positive for heartburn and abdominal pain. Negative for nausea and vomiting.  Genitourinary: Negative for dysuria.  Musculoskeletal: Negative for back pain.  Neurological: Negative for headaches.   Physical Exam   Blood pressure 122/69, pulse 86, temperature 98.5 F (36.9 C), temperature source Oral, resp. rate 18,  last menstrual period 09/18/2013.  Physical Exam  Constitutional: She is oriented to person, place, and time. She appears well-developed and well-nourished.  HENT:  Head: Normocephalic and atraumatic.  Cardiovascular: Normal rate, regular rhythm and intact distal pulses.   No murmur heard. Respiratory: Effort normal and breath sounds normal. She has no wheezes.  GI: Soft. There is tenderness (periumbilical only). There is no rebound.  Neurological: She is alert and oriented to person, place, and time.  Skin: Skin is warm and dry.  Psychiatric: She has a normal mood and affect. Her behavior is normal.   Abdominal exam:  Pt has mild 3 cm herniation immediately above umbilicus when going from supine to sitting position.  Hernia is soft and resolves when pt is at  rest.  FHR: baseline 140, moderate variability, accels present, decels absent Toco: some irritability, no definite contractions   Dilation: Closed Exam by:: L Leftwich-Kirby CNM  Cervix 0/thick/high, posterior, firm  MAU Course  Procedures   Assessment and Plan   1. Umbilical hernia without obstruction and without gangrene   2. Abdominal pain affecting pregnancy     D/C home Heat/warm bath/tylenol for pain Discussed good ergonomics to prevent abdominal strain in pregnancy F/U with [facility] as scheduled Return to MAU as needed for emergencies  Sharen Counter Certified Nurse-Midwife  LEFTWICH-KIRBY, Bronsyn Shappell 04/05/2014, 7:27 AM

## 2014-04-05 NOTE — Discharge Instructions (Signed)
Hernia °A hernia happens when an organ inside your body pushes out through a weak spot in your belly (abdominal) wall. Most hernias get worse over time. They can often be pushed back into place (reduced). Surgery may be needed to repair hernias that cannot be pushed into place. °HOME CARE °· Keep doing normal activities. °· Avoid lifting more than 10 pounds (4.5 kilograms). °· Cough gently and avoid straining. Over time, these things will: °¨ Increase your hernia size. °¨ Irritate your hernia. °¨ Break down hernia repairs. °· Stop smoking. °· Do not wear anything tight over your hernia. Do not keep the hernia in with an outside bandage. °· Eat food that is high in fiber (fruit, vegetables, whole grains). °· Drink enough fluids to keep your pee (urine) clear or pale yellow. °· Take medicines to make your poop soft (stool softeners) if you cannot poop (constipated). °GET HELP RIGHT AWAY IF:  °· You have a fever. °· You have belly pain that gets worse. °· You feel sick to your stomach (nauseous) and throw up (vomit). °· Your skin starts to bulge out. °· Your hernia turns a different color, feels hard, or is tender. °· You have increased pain or puffiness (swelling) around the hernia. °· You poop more or less often. °· Your poop does not look the way normally does. °· You have watery poop (diarrhea). °· You cannot push the hernia back in place by applying gentle pressure while lying down. °MAKE SURE YOU:  °· Understand these instructions. °· Will watch your condition. °· Will get help right away if you are not doing well or get worse. °Document Released: 12/06/2009 Document Revised: 09/10/2011 Document Reviewed: 12/06/2009 °ExitCare® Patient Information ©2015 [employer], LLC. This information is not intended to replace advice given to you by your health care provider. Make sure you discuss any questions you have with your health care provider. ° °

## 2014-04-09 ENCOUNTER — Inpatient Hospital Stay (HOSPITAL_COMMUNITY)
Admission: AD | Admit: 2014-04-09 | Discharge: 2014-04-09 | Disposition: A | Discharge status: Home / Self Care | Payer: Medicaid Other | Source: Ambulatory Visit | Attending: Obstetrics | Admitting: Obstetrics

## 2014-04-09 ENCOUNTER — Encounter (HOSPITAL_COMMUNITY): Payer: Self-pay | Admitting: *Deleted

## 2014-04-09 DIAGNOSIS — K088 Other specified disorders of teeth and supporting structures: Secondary | ICD-10-CM | POA: Insufficient documentation

## 2014-04-09 DIAGNOSIS — Z3A29 29 weeks gestation of pregnancy: Secondary | ICD-10-CM

## 2014-04-09 DIAGNOSIS — O9989 Other specified diseases and conditions complicating pregnancy, childbirth and the puerperium: Secondary | ICD-10-CM | POA: Insufficient documentation

## 2014-04-09 DIAGNOSIS — K047 Periapical abscess without sinus: Secondary | ICD-10-CM

## 2014-04-09 DIAGNOSIS — O2693 Pregnancy related conditions, unspecified, third trimester: Secondary | ICD-10-CM

## 2014-04-09 LAB — URINALYSIS, ROUTINE W REFLEX MICROSCOPIC
Bilirubin Urine: NEGATIVE
GLUCOSE, UA: NEGATIVE mg/dL
HGB URINE DIPSTICK: NEGATIVE
KETONES UR: NEGATIVE mg/dL
LEUKOCYTES UA: NEGATIVE
Nitrite: NEGATIVE
PH: 7.5 (ref 5.0–8.0)
Protein, ur: NEGATIVE mg/dL
SPECIFIC GRAVITY, URINE: 1.015 (ref 1.005–1.030)
Urobilinogen, UA: 0.2 mg/dL (ref 0.0–1.0)

## 2014-04-09 MED ORDER — OXYCODONE-ACETAMINOPHEN 5-325 MG PO TABS: 1.0000 | ORAL_TABLET | ORAL | Status: DC | PRN

## 2014-04-09 MED ORDER — AMOXICILLIN 250 MG PO CAPS: 250.0000 mg | ORAL_CAPSULE | Freq: Three times a day (TID) | ORAL | Status: DC

## 2014-04-09 NOTE — MAU Note (Signed)
Pt states that rear lower molar on right side of mouth has been throbbing since yesterday. She took tylenol twice with no relief.

## 2014-04-09 NOTE — MAU Provider Note (Signed)
History     CSN: [id number]  Arrival date and time: 04/09/14 0705   First Provider Initiated Contact with Patient 04/09/14 [phone]      Chief Complaint  Patient presents with  . Dental Pain   HPI [name] is a 33 y.o. G3P2002 at 2222w0d who presents to MAU today with complaint of tooth pain since yesterday. The patient states that she is having pain between her back 2 bottom molars on the right side. She states that she took Tylenol #3 last night and was able to sleep for a short time, but was then woken up by the pain. She denies fever. She denies abdominal pain, contractions, vaginal bleeding or LOF. She reports good fetal movement.   OB History   Grav Para Term Preterm Abortions TAB SAB Ect Mult Living   3 2 2       2       Past Medical History  Diagnosis Date  . GERD (gastroesophageal reflux disease)   . Erosive esophagitis     distal esophagus, with low grade bleed 03/2009  . IBS (irritable bowel syndrome)   . Anemia   . History of migraine headaches   . Asthma   . Headache(784.0)   . Infection     UTI    Past Surgical History  Procedure Laterality Date  . No past surgeries      Family History  Problem Relation Age of Onset  . Migraines Mother   . Asthma Mother   . Migraines Father   . Heart disease Father   . Asthma Daughter   . Cancer Maternal Grandmother     History  Substance Use Topics  . Smoking status: Never Smoker   . Smokeless tobacco: Never Used  . Alcohol Use: No     Comment: occasional on holidays    Allergies:  Allergies  Allergen Reactions  . Ibuprofen Other (See Comments)    Ulcer flare up     Prescriptions prior to admission  Medication Sig Dispense Refill  . acetaminophen-codeine (TYLENOL #3) 300-30 MG per tablet Take 1 tablet by mouth every 4 (four) hours as needed for moderate pain.  15 tablet  0  . albuterol (PROVENTIL HFA;VENTOLIN HFA) 108 (90 BASE) MCG/ACT inhaler Inhale 1-2 puffs into the lungs every 6 (six)  hours as needed for wheezing or shortness of breath.  1 Inhaler  3  . diphenhydrAMINE (BENADRYL) 12.5 MG/5ML liquid Take 25 mg by mouth at bedtime as needed for allergies.      Marland Kitchen. ondansetron (ZOFRAN ODT) 4 MG disintegrating tablet Take 1 tablet (4 mg total) by mouth every 8 (eight) hours as needed for nausea or vomiting.  20 tablet  0  . Prenatal Vit-Fe Fumarate-FA (PRENATAL VITAMIN) 27-0.8 MG TABS Take 1 tablet by mouth daily.  30 tablet  3    Review of Systems  Constitutional: Negative for fever.  HENT:       + tooth pain  Gastrointestinal: Negative for abdominal pain.  Genitourinary:       Neg - vaginal bleeding, discharge, LOF   Physical Exam   Blood pressure 114/63, pulse 89, temperature 98.3 F (36.8 C), temperature source Oral, resp. rate 16, height 5' 5.25" (1.657 m), weight 204 lb 9.6 oz (92.806 kg), last menstrual period 09/18/2013.  Physical Exam  Constitutional: She is oriented to person, place, and time. She appears well-developed and well-nourished. No distress.  HENT:  Head: Normocephalic.  Mouth/Throat: Uvula is midline, oropharynx is clear  and moist and mucous membranes are normal. Abnormal dentition. Dental abscesses and dental caries present.    Cardiovascular: Normal rate.   Respiratory: Effort normal.  Neurological: She is alert and oriented to person, place, and time.  Skin: Skin is warm and dry. No erythema.  Psychiatric: She has a normal mood and affect.   Fetal Monitoring: Baseline: 130 bpm, moderate variability, + accelerations, no decelerations Contractions: none  MAU Course  Procedures None  Assessment and Plan  A: SIUP at 6962w0d Dental pain possible abscess  P: Discharge home Rx for Amoxicillin and Percocet given to patient Patient advised to discontinue use of Tylenol #3 and use Percocet instead since patient did not feel significant relief from the Tylenol #3 Patient advised to contact her dentist ASAP for an appointment Patient advised  to follow-up with [facility] as scheduled for routine prenatal care Patient may return to MAU as needed or if her condition were to change or worsen  Marny LowensteinJulie N Wenzel, PA-C  04/09/2014, 7:31 AM

## 2014-04-09 NOTE — Discharge Instructions (Signed)
Abscessed Tooth An abscessed tooth is an infection around your tooth. It may be caused by holes or damage to the tooth (cavity) or a dental disease. An abscessed tooth causes mild to very bad pain in and around the tooth. See your dentist right away if you have tooth or gum pain. HOME CARE  Take your medicine as told. Finish it even if you start to feel better.  Do not drive after taking pain medicine.  Rinse your mouth (gargle) often with salt water ( teaspoon salt in 8 ounces of warm water).  Do not apply heat to the outside of your face. GET HELP RIGHT AWAY IF:   You have a temperature by mouth above 102 F (38.9 C), not controlled by medicine.  You have chills and a very bad headache.  You have problems breathing or swallowing.  Your mouth will not open.  You develop puffiness (swelling) on the neck or around the eye.  Your pain is not helped by medicine.  Your pain is getting worse instead of better. MAKE SURE YOU:   Understand these instructions.  Will watch your condition.  Will get help right away if you are not doing well or get worse. Document Released: 12/05/2007 Document Revised: 09/10/2011 Document Reviewed: 09/26/2010 ExitCare Patient Information 2015 ExitCare, [employer]. This information is not intended to replace advice given to you by your health care provider. Make sure you discuss any questions you have with your health care provider.  

## 2014-04-12 ENCOUNTER — Telehealth: Payer: Self-pay | Admitting: *Deleted

## 2014-04-12 ENCOUNTER — Encounter: Payer: Self-pay | Admitting: *Deleted

## 2014-04-12 NOTE — Telephone Encounter (Signed)
Called [name] and she states [facility] and she [facility], but they said she has to have a referral. I informed her I need her to sign a release of records and then we can send referral. She states she will come today to sign release.

## 2014-04-12 NOTE — Telephone Encounter (Signed)
[name] came and signed release. States she used to have the orange card [facility] but no longer has an orange card and will go wherever she is sent. Informed her since she is pregnant [facility]  Will have front office send referral letter and office notes. [name] SimsGave [name] phone number so she can follow up with the dental clinic if she doesn't hear from them soon.

## 2014-04-12 NOTE — Telephone Encounter (Signed)
[name] called and states she is requesting a call about seeing a dentist. [facility] . States even the dentist she used to go to needs a referral. States please call her because her mouth is killing her.

## 2014-04-15 ENCOUNTER — Encounter: Payer: Self-pay | Admitting: *Deleted

## 2014-04-15 ENCOUNTER — Telehealth: Payer: Self-pay | Admitting: *Deleted

## 2014-04-15 NOTE — Telephone Encounter (Signed)
Pt contacted clinic requesting letter for dentist.  Pt states she is in extreme pain.  She is on a cancellation list at one dentist but called and has an appointment in the am 10/16.  Needs letter for dental procedures in pregnancy.  Will send letter.   Letter sent.

## 2014-04-20 ENCOUNTER — Ambulatory Visit (INDEPENDENT_AMBULATORY_CARE_PROVIDER_SITE_OTHER): Payer: Medicaid Other | Admitting: Obstetrics & Gynecology

## 2014-04-20 VITALS — BP 120/72 | HR 87 | Wt 206.4 lb

## 2014-04-20 DIAGNOSIS — O99519 Diseases of the respiratory system complicating pregnancy, unspecified trimester: Principal | ICD-10-CM

## 2014-04-20 DIAGNOSIS — J45909 Unspecified asthma, uncomplicated: Secondary | ICD-10-CM

## 2014-04-20 DIAGNOSIS — O9989 Other specified diseases and conditions complicating pregnancy, childbirth and the puerperium: Secondary | ICD-10-CM

## 2014-04-20 LAB — POCT URINALYSIS DIP (DEVICE)
Bilirubin Urine: NEGATIVE
GLUCOSE, UA: NEGATIVE mg/dL
Ketones, ur: 15 mg/dL — AB
Leukocytes, UA: NEGATIVE
NITRITE: NEGATIVE
Protein, ur: NEGATIVE mg/dL
Specific Gravity, Urine: 1.025 (ref 1.005–1.030)
UROBILINOGEN UA: 0.2 mg/dL (ref 0.0–1.0)
pH: 6 (ref 5.0–8.0)

## 2014-04-20 NOTE — Progress Notes (Signed)
Uncomfortable at work on her feet.

## 2014-04-20 NOTE — Progress Notes (Signed)
Patient reports that she has an umbilical hernia.  She wants to reduce her hours to 4-5 hours a day because she is experiencing swelling in her feet. Has form for us to fill out.

## 2014-04-29 ENCOUNTER — Inpatient Hospital Stay (HOSPITAL_COMMUNITY)
Admission: AD | Admit: 2014-04-29 | Discharge: 2014-04-29 | Disposition: A | Discharge status: Home / Self Care | Payer: Medicaid Other | Source: Ambulatory Visit | Attending: Family Medicine | Admitting: Family Medicine

## 2014-04-29 ENCOUNTER — Encounter (HOSPITAL_COMMUNITY): Payer: Self-pay | Admitting: *Deleted

## 2014-04-29 DIAGNOSIS — R109 Unspecified abdominal pain: Secondary | ICD-10-CM

## 2014-04-29 DIAGNOSIS — O9989 Other specified diseases and conditions complicating pregnancy, childbirth and the puerperium: Secondary | ICD-10-CM

## 2014-04-29 DIAGNOSIS — R1030 Lower abdominal pain, unspecified: Secondary | ICD-10-CM | POA: Diagnosis not present

## 2014-04-29 DIAGNOSIS — O26899 Other specified pregnancy related conditions, unspecified trimester: Secondary | ICD-10-CM

## 2014-04-29 DIAGNOSIS — Z3A31 31 weeks gestation of pregnancy: Secondary | ICD-10-CM | POA: Diagnosis not present

## 2014-04-29 DIAGNOSIS — O26893 Other specified pregnancy related conditions, third trimester: Secondary | ICD-10-CM | POA: Diagnosis present

## 2014-04-29 LAB — URINALYSIS, ROUTINE W REFLEX MICROSCOPIC
Bilirubin Urine: NEGATIVE
Glucose, UA: NEGATIVE mg/dL
HGB URINE DIPSTICK: NEGATIVE
Ketones, ur: NEGATIVE mg/dL
LEUKOCYTES UA: NEGATIVE
Nitrite: NEGATIVE
PROTEIN: NEGATIVE mg/dL
Specific Gravity, Urine: 1.025 (ref 1.005–1.030)
UROBILINOGEN UA: 0.2 mg/dL (ref 0.0–1.0)
pH: 6.5 (ref 5.0–8.0)

## 2014-04-29 LAB — FETAL FIBRONECTIN: FETAL FIBRONECTIN: NEGATIVE

## 2014-04-29 MED ORDER — TRAMADOL HCL 50 MG PO TABS
50.0000 mg | ORAL_TABLET | Freq: Once | ORAL | Status: AC
  Administered 2014-04-29: 50 mg via ORAL
  Filled 2014-04-29: qty 1

## 2014-04-29 NOTE — MAU Provider Note (Signed)
Attestation of Attending Supervision of Advanced Practitioner (CNM/NP): Evaluation and management procedures were performed by the Advanced Practitioner under my supervision and collaboration.  I have reviewed the Advanced Practitioner's note and chart, and I agree with the management and plan.  HARRAWAY-SMITH, Antinio Sanderfer 2:36 PM     

## 2014-04-29 NOTE — MAU Note (Signed)
Pt reports she stared having abd cramping at work 2 hours ago. Denies any vag bleeding or discharge.

## 2014-04-29 NOTE — Discharge Instructions (Signed)
Maternity support belt at [facility] or [facility]  Preterm Labor Information Preterm labor is when labor starts at less than 37 weeks of pregnancy. The normal length of a pregnancy is 39 to 41 weeks. CAUSES Often, there is no identifiable underlying cause as to why a woman goes into preterm labor. One of the most common known causes of preterm labor is infection. Infections of the uterus, cervix, vagina, amniotic sac, bladder, kidney, or even the lungs (pneumonia) can cause labor to start. Other suspected causes of preterm labor include:   Urogenital infections, such as yeast infections and bacterial vaginosis.   Uterine abnormalities (uterine shape, uterine septum, fibroids, or bleeding from the placenta).   A cervix that has been operated on (it may fail to stay closed).   Malformations in the fetus.   Multiple gestations (twins, triplets, and so on).   Breakage of the amniotic sac.  RISK FACTORS  Having a previous history of preterm labor.   Having premature rupture of membranes (PROM).   Having a placenta that covers the opening of the cervix (placenta previa).   Having a placenta that separates from the uterus (placental abruption).   Having a cervix that is too weak to hold the fetus in the uterus (incompetent cervix).   Having too much fluid in the amniotic sac (polyhydramnios).   Taking illegal drugs or smoking while pregnant.   Not gaining enough weight while pregnant.   Being younger than 35 and older than 35 years old.   Having a low socioeconomic status.   Being African American. SYMPTOMS Signs and symptoms of preterm labor include:   Menstrual-like cramps, abdominal pain, or back pain.  Uterine contractions that are regular, as frequent as six in an hour, regardless of their intensity (may be mild or painful).  Contractions that start on the top of the uterus and spread down to the lower abdomen and back.   A sense of  increased pelvic pressure.   A watery or bloody mucus discharge that comes from the vagina.  TREATMENT Depending on the length of the pregnancy and other circumstances, your health care provider may suggest bed rest. If necessary, there are medicines that can be given to stop contractions and to mature the fetal lungs. If labor happens before 34 weeks of pregnancy, a prolonged hospital stay may be recommended. Treatment depends on the condition of both you and the fetus.  WHAT SHOULD YOU DO IF YOU THINK YOU ARE IN PRETERM LABOR? Call your health care provider right away. You will need to go to the hospital to get checked immediately. HOW CAN YOU PREVENT PRETERM LABOR IN FUTURE PREGNANCIES? You should:   Stop smoking if you smoke.  Maintain healthy weight gain and avoid chemicals and drugs that are not necessary.  Be watchful for any type of infection.  Inform your health care provider if you have a known history of preterm labor. Document Released: 09/08/2003 Document Revised: 02/18/2013 Document Reviewed: 07/21/2012 [facility] Patient Information 2015 [employer] [place]. This information is not intended to replace advice given to you by your health care provider. Make sure you discuss any questions you have with your health care provider.

## 2014-04-29 NOTE — MAU Provider Note (Signed)
History     CSN: [id number]  Arrival date and time: 04/29/14 [id number]   First Provider Initiated Contact with Patient 04/29/14 1007      Chief Complaint  Patient presents with  . Abdominal Cramping   HPI  Nani GasserFealethea D Sagan is a 33 y.o. G3P2002 at 3526w6d who presents with lower abdominal cramping for 2 hours prior to arrival. Patients does not think that they feel like contractions. Pain is currently 8/10. Has not tried anything for the pain. No previous similar experiences. Denies any dysuria or vaginal discharge. No nausea, vomiting, or diarrhea. No LOF or vaginal bleeding. Good fetal movement.    OB History   Grav Para Term Preterm Abortions TAB SAB Ect Mult Living   3 2 2       2       Past Medical History  Diagnosis Date  . GERD (gastroesophageal reflux disease)   . Erosive esophagitis     distal esophagus, with low grade bleed 03/2009  . IBS (irritable bowel syndrome)   . Anemia   . History of migraine headaches   . Asthma   . Headache(784.0)   . Infection     UTI    Past Surgical History  Procedure Laterality Date  . No past surgeries      Family History  Problem Relation Age of Onset  . Migraines Mother   . Asthma Mother   . Migraines Father   . Heart disease Father   . Asthma Daughter   . Cancer Maternal Grandmother     History  Substance Use Topics  . Smoking status: Never Smoker   . Smokeless tobacco: Never Used  . Alcohol Use: No     Comment: occasional on holidays    Allergies:  Allergies  Allergen Reactions  . Ibuprofen Other (See Comments)    Ulcer flare up     Prescriptions prior to admission  Medication Sig Dispense Refill  . albuterol (PROVENTIL HFA;VENTOLIN HFA) 108 (90 BASE) MCG/ACT inhaler Inhale 1-2 puffs into the lungs every 6 (six) hours as needed for wheezing or shortness of breath.  1 Inhaler  3  . Prenatal Vit-Fe Fumarate-FA (PRENATAL MULTIVITAMIN) TABS tablet Take 1 tablet by mouth daily at 12 noon.      Marland Kitchen.  oxyCODONE-acetaminophen (PERCOCET/ROXICET) 5-325 MG per tablet Take 1-2 tablets by mouth every 4 (four) hours as needed for severe pain.  20 tablet  0    Review of Systems  All other systems reviewed and are negative.  Physical Exam   Blood pressure 129/67, pulse 82, temperature 98.3 F (36.8 C), temperature source Oral, resp. rate 18, height 5' 5.25" (1.657 m), weight 95.437 kg (210 lb 6.4 oz), last menstrual period 09/18/2013.  Physical Exam  Nursing note and vitals reviewed. Constitutional: She is oriented to person, place, and time. She appears well-developed and well-nourished. No distress.  HENT:  Head: Normocephalic and atraumatic.  Eyes: EOM are normal.  Neck: Normal range of motion.  Cardiovascular: Normal rate, regular rhythm and normal heart sounds.   No murmur heard. Respiratory: Effort normal and breath sounds normal. No respiratory distress. She has no wheezes.  GI: Soft. There is no tenderness.  Uterine size appropriate for gestational age  Musculoskeletal: Normal range of motion. She exhibits no edema.  Neurological: She is alert and oriented to person, place, and time. She has normal reflexes.  Skin: Skin is warm and dry.  FHT:  FHR: 130 bpm, variability: moderate,  accelerations:  present,  decelerations:  absent  UC: None Dilation: Fingertip Effacement (%): Thick Station: -3 Exam by:: Dr. Jimmey RalphParker  Procedures-None  Results for orders placed during the hospital encounter of 04/29/14 (from the past 24 hour(s))  URINALYSIS, ROUTINE W REFLEX MICROSCOPIC     Status: None   Collection Time    04/29/14  9:46 AM      Result Value Ref Range   Color, Urine YELLOW  YELLOW   APPearance CLEAR  CLEAR   Specific Gravity, Urine 1.025  1.005 - 1.030   pH 6.5  5.0 - 8.0   Glucose, UA NEGATIVE  NEGATIVE mg/dL   Hgb urine dipstick NEGATIVE  NEGATIVE   Bilirubin Urine NEGATIVE  NEGATIVE   Ketones, ur NEGATIVE  NEGATIVE mg/dL   Protein, ur NEGATIVE  NEGATIVE mg/dL    Urobilinogen, UA 0.2  0.0 - 1.0 mg/dL   Nitrite NEGATIVE  NEGATIVE   Leukocytes, UA NEGATIVE  NEGATIVE  FETAL FIBRONECTIN     Status: None   Collection Time    04/29/14 10:11 AM      Result Value Ref Range   Fetal Fibronectin NEGATIVE  NEGATIVE    Assessment and Plan  A: 3730w6d IUP with abdominal cramping. FHT category 1. Not in labor. Pain responded to tramadol.  P: Discharge home in stable condition Labor precautions and kickcounts reviewed.   Jacquiline Doearker, Caleb 04/29/2014, 10:51 AM   I was present for the exam and agree with above.  [place] Glenna [place], CNM 04/29/2014 2:30 PM

## 2014-05-03 ENCOUNTER — Encounter (HOSPITAL_COMMUNITY): Payer: Self-pay | Admitting: *Deleted

## 2014-05-04 ENCOUNTER — Ambulatory Visit (INDEPENDENT_AMBULATORY_CARE_PROVIDER_SITE_OTHER): Payer: Medicaid Other | Admitting: Advanced Practice Midwife

## 2014-05-04 VITALS — BP 119/73 | HR 86 | Wt 210.6 lb

## 2014-05-04 DIAGNOSIS — Z3493 Encounter for supervision of normal pregnancy, unspecified, third trimester: Secondary | ICD-10-CM

## 2014-05-04 LAB — POCT URINALYSIS DIP (DEVICE)
Bilirubin Urine: NEGATIVE
Glucose, UA: NEGATIVE mg/dL
Hgb urine dipstick: NEGATIVE
Ketones, ur: NEGATIVE mg/dL
Nitrite: NEGATIVE
PH: 7 (ref 5.0–8.0)
PROTEIN: NEGATIVE mg/dL
SPECIFIC GRAVITY, URINE: 1.025 (ref 1.005–1.030)
Urobilinogen, UA: 0.2 mg/dL (ref 0.0–1.0)

## 2014-05-04 NOTE — Progress Notes (Signed)
Wants to stop work. Lots of pressure and feet swelling. 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 Already has papers filled out. I told her we cannot certify disability but can only state her discomforts. Advised to get belly belt for support.

## 2014-05-04 NOTE — Patient Instructions (Signed)
Third Trimester of Pregnancy The third trimester is from week 29 through week 42, months 7 through 9. The third trimester is a time when the fetus is growing rapidly. At the end of the ninth month, the fetus is about 20 inches in length and weighs 6-10 pounds.  BODY CHANGES Your body goes through many changes during pregnancy. The changes vary from woman to woman.   Your weight will continue to increase. You can expect to gain 25-35 pounds (11-16 kg) by the end of the pregnancy.  You may begin to get stretch marks on your hips, abdomen, and breasts.  You may urinate more often because the fetus is moving lower into your pelvis and pressing on your bladder.  You may develop or continue to have heartburn as a result of your pregnancy.  You may develop constipation because certain hormones are causing the muscles that push waste through your intestines to slow down.  You may develop hemorrhoids or swollen, bulging veins (varicose veins).  You may have pelvic pain because of the weight gain and pregnancy hormones relaxing your joints between the bones in your pelvis. Backaches may result from overexertion of the muscles supporting your posture.  You may have changes in your hair. These can include thickening of your hair, rapid growth, and changes in texture. Some women also have hair loss during or after pregnancy, or hair that feels dry or thin. Your hair will most likely return to normal after your baby is born.  Your breasts will continue to grow and be tender. A yellow discharge may leak from your breasts called colostrum.  Your belly button may stick out.  You may feel short of breath because of your expanding uterus.  You may notice the fetus "dropping," or moving lower in your abdomen.  You may have a bloody mucus discharge. This usually occurs a few days to a week before labor begins.  Your cervix becomes thin and soft (effaced) near your due date. WHAT TO EXPECT AT YOUR PRENATAL  EXAMS  You will have prenatal exams every 2 weeks until week 36. Then, you will have weekly prenatal exams. During a routine prenatal visit:  You will be weighed to make sure you and the fetus are growing normally.  Your blood pressure is taken.  Your abdomen will be measured to track your baby's growth.  The fetal heartbeat will be listened to.  Any test results from the previous visit will be discussed.  You may have a cervical check near your due date to see if you have effaced. At around 36 weeks, your caregiver will check your cervix. At the same time, your caregiver will also perform a test on the secretions of the vaginal tissue. This test is to determine if a type of bacteria, Group B streptococcus, is present. Your caregiver will explain this further. Your caregiver may ask you:  What your birth plan is.  How you are feeling.  If you are feeling the baby move.  If you have had any abnormal symptoms, such as leaking fluid, bleeding, severe headaches, or abdominal cramping.  If you have any questions. Other tests or screenings that may be performed during your third trimester include:  Blood tests that check for low iron levels (anemia).  Fetal testing to check the health, activity level, and growth of the fetus. Testing is done if you have certain medical conditions or if there are problems during the pregnancy. FALSE LABOR You may feel small, irregular contractions that   eventually go away. These are called Braxton Hicks contractions, or false labor. Contractions may last for hours, days, or even weeks before true labor sets in. If contractions come at regular intervals, intensify, or become painful, it is best to be seen by your caregiver.  SIGNS OF LABOR   Menstrual-like cramps.  Contractions that are 5 minutes apart or less.  Contractions that start on the top of the uterus and spread down to the lower abdomen and back.  A sense of increased pelvic pressure or back  pain.  A watery or bloody mucus discharge that comes from the vagina. If you have any of these signs before the 37th week of pregnancy, call your caregiver right away. You need to go to the hospital to get checked immediately. HOME CARE INSTRUCTIONS   Avoid all smoking, herbs, alcohol, and unprescribed drugs. These chemicals affect the formation and growth of the baby.  Follow your caregiver's instructions regarding medicine use. There are medicines that are either safe or unsafe to take during pregnancy.  Exercise only as directed by your caregiver. Experiencing uterine cramps is a good sign to stop exercising.  Continue to eat regular, healthy meals.  Wear a good support bra for breast tenderness.  Do not use hot tubs, steam rooms, or saunas.  Wear your seat belt at all times when driving.  Avoid raw meat, uncooked cheese, cat litter boxes, and soil used by cats. These carry germs that can cause birth defects in the baby.  Take your prenatal vitamins.  Try taking a stool softener (if your caregiver approves) if you develop constipation. Eat more high-fiber foods, such as fresh vegetables or fruit and whole grains. Drink plenty of fluids to keep your urine clear or pale yellow.  Take warm sitz baths to soothe any pain or discomfort caused by hemorrhoids. Use hemorrhoid cream if your caregiver approves.  If you develop varicose veins, wear support hose. Elevate your feet for 15 minutes, 3-4 times a day. Limit salt in your diet.  Avoid heavy lifting, wear low heal shoes, and practice good posture.  Rest a lot with your legs elevated if you have leg cramps or low back pain.  Visit your dentist if you have not gone during your pregnancy. Use a soft toothbrush to brush your teeth and be gentle when you floss.  A sexual relationship may be continued unless your caregiver directs you otherwise.  Do not travel far distances unless it is absolutely necessary and only with the approval  of your caregiver.  Take prenatal classes to understand, practice, and ask questions about the labor and delivery.  Make a trial run to the hospital.  Pack your hospital bag.  Prepare the baby's nursery.  Continue to go to all your prenatal visits as directed by your caregiver. SEEK MEDICAL CARE IF:  You are unsure if you are in labor or if your water has broken.  You have dizziness.  You have mild pelvic cramps, pelvic pressure, or nagging pain in your abdominal area.  You have persistent nausea, vomiting, or diarrhea.  You have a bad smelling vaginal discharge.  You have pain with urination. SEEK IMMEDIATE MEDICAL CARE IF:   You have a fever.  You are leaking fluid from your vagina.  You have spotting or bleeding from your vagina.  You have severe abdominal cramping or pain.  You have rapid weight loss or gain.  You have shortness of breath with chest pain.  You notice sudden or extreme swelling   of your face, hands, ankles, feet, or legs.  You have not felt your baby move in over an hour.  You have severe headaches that do not go away with medicine.  You have vision changes. Document Released: 06/12/2001 Document Revised: 06/23/2013 Document Reviewed: 08/19/2012 ExitCare Patient Information 2015 [employer]. This information is not intended to replace advice given to you by your health care provider. Make sure you discuss any questions you have with your health care provider.  

## 2014-05-04 NOTE — Progress Notes (Signed)
Short Term Disability form filled out with [name] assistance. Patient not disabled or advised to discontinue work, restrictions were listed on form.

## 2014-05-05 ENCOUNTER — Encounter: Payer: Self-pay | Admitting: *Deleted

## 2014-05-23 ENCOUNTER — Inpatient Hospital Stay (HOSPITAL_COMMUNITY)
Admission: AD | Admit: 2014-05-23 | Discharge: 2014-05-23 | Disposition: A | Discharge status: Home / Self Care | Payer: Medicaid Other | Source: Ambulatory Visit | Attending: Obstetrics & Gynecology | Admitting: Obstetrics & Gynecology

## 2014-05-23 ENCOUNTER — Encounter (HOSPITAL_COMMUNITY): Payer: Self-pay | Admitting: *Deleted

## 2014-05-23 DIAGNOSIS — R109 Unspecified abdominal pain: Secondary | ICD-10-CM | POA: Diagnosis not present

## 2014-05-23 DIAGNOSIS — K589 Irritable bowel syndrome without diarrhea: Secondary | ICD-10-CM

## 2014-05-23 DIAGNOSIS — O26893 Other specified pregnancy related conditions, third trimester: Secondary | ICD-10-CM | POA: Diagnosis not present

## 2014-05-23 DIAGNOSIS — Z3A35 35 weeks gestation of pregnancy: Secondary | ICD-10-CM | POA: Insufficient documentation

## 2014-05-23 LAB — URINALYSIS, ROUTINE W REFLEX MICROSCOPIC
Bilirubin Urine: NEGATIVE
Glucose, UA: NEGATIVE mg/dL
Hgb urine dipstick: NEGATIVE
KETONES UR: NEGATIVE mg/dL
LEUKOCYTES UA: NEGATIVE
Nitrite: NEGATIVE
PROTEIN: NEGATIVE mg/dL
Specific Gravity, Urine: 1.015 (ref 1.005–1.030)
Urobilinogen, UA: 0.2 mg/dL (ref 0.0–1.0)
pH: 6.5 (ref 5.0–8.0)

## 2014-05-23 MED ORDER — DOCUSATE SODIUM 50 MG PO CAPS: 50.0000 mg | ORAL_CAPSULE | Freq: Two times a day (BID) | ORAL | Status: DC

## 2014-05-23 MED ORDER — GLYCERIN (LAXATIVE) 2 G RE SUPP: 1.0000 | Freq: Once | RECTAL | Status: DC | PRN

## 2014-05-23 NOTE — Discharge Instructions (Signed)
Braxton Hicks Contractions °Contractions of the uterus can occur throughout pregnancy. Contractions are not always a sign that you are in labor.  °WHAT ARE BRAXTON HICKS CONTRACTIONS?  °Contractions that occur before labor are called Braxton Hicks contractions, or false labor. Toward the end of pregnancy (32-34 weeks), these contractions can develop more often and may become more forceful. This is not true labor because these contractions do not result in opening (dilatation) and thinning of the cervix. They are sometimes difficult to tell apart from true labor because these contractions can be forceful and people have different pain tolerances. You should not feel embarrassed if you go to the hospital with false labor. Sometimes, the only way to tell if you are in true labor is for your health care provider to look for changes in the cervix. °If there are no prenatal problems or other health problems associated with the pregnancy, it is completely safe to be sent home with false labor and await the onset of true labor. °HOW CAN YOU TELL THE DIFFERENCE BETWEEN TRUE AND FALSE LABOR? °False Labor °· The contractions of false labor are usually shorter and not as hard as those of true labor.   °· The contractions are usually irregular.   °· The contractions are often felt in the front of the lower abdomen and in the groin.   °· The contractions may go away when you walk around or change positions while lying down.   °· The contractions get weaker and are shorter lasting as time goes on.   °· The contractions do not usually become progressively stronger, regular, and closer together as with true labor.   °True Labor °· Contractions in true labor last 30-70 seconds, become very regular, usually become more intense, and increase in frequency.   °· The contractions do not go away with walking.   °· The discomfort is usually felt in the top of the uterus and spreads to the lower abdomen and low back.   °· True labor can be  determined by your health care provider with an exam. This will show that the cervix is dilating and getting thinner.   °WHAT TO REMEMBER °· Keep up with your usual exercises and follow other instructions given by your health care provider.   °· Take medicines as directed by your health care provider.   °· Keep your regular prenatal appointments.   °· Eat and drink lightly if you think you are going into labor.   °· If Braxton Hicks contractions are making you uncomfortable:   °· Change your position from lying down or resting to walking, or from walking to resting.   °· Sit and rest in a tub of warm water.   °· Drink 2-3 glasses of water. Dehydration may cause these contractions.   °· Do slow and deep breathing several times an hour.   °WHEN SHOULD I SEEK IMMEDIATE MEDICAL CARE? °Seek immediate medical care if: °· Your contractions become stronger, more regular, and closer together.   °· You have fluid leaking or gushing from your vagina.   °· You have a fever.   °· You pass blood-tinged mucus.   °· You have vaginal bleeding.   °· You have continuous abdominal pain.   °· You have low back pain that you never had before.   °· You feel your baby's head pushing down and causing pelvic pressure.   °· Your baby is not moving as much as it used to.   °Document Released: 06/18/2005 Document Revised: 06/23/2013 Document Reviewed: 03/30/2013 °Exit[employer]® Patient Information ©2015 [employer]. This information is not intended to replace advice given to you by your health care   provider. Make sure you discuss any questions you have with your health care provider. °Constipation °Constipation is when a person has fewer than three bowel movements a week, has difficulty having a bowel movement, or has stools that are dry, hard, or larger than normal. As people grow older, constipation is more common. If you try to fix constipation with medicines that make you have a bowel movement (laxatives), the problem may get worse. Long-term  laxative use may cause the muscles of the colon to become weak. A low-fiber diet, not taking in enough fluids, and taking certain medicines may make constipation worse.  °CAUSES  °· Certain medicines, such as antidepressants, pain medicine, iron supplements, antacids, and water pills.   °· Certain diseases, such as diabetes, irritable bowel syndrome (IBS), thyroid disease, or depression.   °· Not drinking enough water.   °· Not eating enough fiber-rich foods.   °· Stress or travel.   °· Lack of physical activity or exercise.   °· Ignoring the urge to have a bowel movement.   °· Using laxatives too much.   °SIGNS AND SYMPTOMS  °· Having fewer than three bowel movements a week.   °· Straining to have a bowel movement.   °· Having stools that are hard, dry, or larger than normal.   °· Feeling full or bloated.   °· Pain in the lower abdomen.   °· Not feeling relief after having a bowel movement.   °DIAGNOSIS  °Your health care provider will take a medical history and perform a physical exam. Further testing may be done for severe constipation. Some tests may include: °· A barium enema X-ray to examine your rectum, colon, and, sometimes, your small intestine.   °· A sigmoidoscopy to examine your lower colon.   °· A colonoscopy to examine your entire colon. °TREATMENT  °Treatment will depend on the severity of your constipation and what is causing it. Some dietary treatments include drinking more fluids and eating more fiber-rich foods. Lifestyle treatments may include regular exercise. If these diet and lifestyle recommendations do not help, your health care provider may recommend taking over-the-counter laxative medicines to help you have bowel movements. Prescription medicines may be prescribed if over-the-counter medicines do not work.  °HOME CARE INSTRUCTIONS  °· Eat foods that have a lot of fiber, such as fruits, vegetables, whole grains, and beans. °· Limit foods high in fat and processed sugars, such as french  fries, hamburgers, cookies, candies, and soda.   °· A fiber supplement may be added to your diet if you cannot get enough fiber from foods.   °· Drink enough fluids to keep your urine clear or pale yellow.   °· Exercise regularly or as directed by your health care provider.   °· Go to the restroom when you have the urge to go. Do not hold it.   °· Only take over-the-counter or prescription medicines as directed by your health care provider. Do not take other medicines for constipation without talking to your health care provider first.   °SEEK IMMEDIATE MEDICAL CARE IF:  °· You have bright red blood in your stool.   °· Your constipation lasts for more than 4 days or gets worse.   °· You have abdominal or rectal pain.   °· You have thin, pencil-like stools.   °· You have unexplained weight loss. °MAKE SURE YOU:  °· Understand these instructions. °· Will watch your condition. °· Will get help right away if you are not doing well or get worse. °Document Released: 03/16/2004 Document Revised: 06/23/2013 Document Reviewed: 03/30/2013 °ExitCare® Patient Information ©2015 [employer]. This information is not intended to replace   intended to replace advice given to you by your health care provider. Make sure you discuss any questions you have with your health care provider.

## 2014-05-23 NOTE — MAU Note (Signed)
Pt presents to MAU with complaints of vaginal pressure for three days. Denies any vaginal bleeding or LOF

## 2014-05-23 NOTE — MAU Provider Note (Signed)
History     CSN: [id number]  Arrival date and time: 05/23/14 1255   First Provider Initiated Contact with Patient 05/23/14 1330      Chief Complaint  Patient presents with  . vaginal pressure    HPI  Patient is 33 y.o. [id number] 4624w2d here with complaints of vaginal pressure x3d.  She was recently seen in clinic with a similar complaint and told to wear belly belt for support, but has not.  Alsop reports that she has been constipated x2 days.  She drank coffee and warm apple juice but had no relief.  Used to have suppositories for constipation, but ran out.  +FM, denies LOF, VB, contractions, vaginal discharge.    Past Medical History  Diagnosis Date  . GERD (gastroesophageal reflux disease)   . Erosive esophagitis     distal esophagus, with low grade bleed 03/2009  . IBS (irritable bowel syndrome)   . Anemia   . History of migraine headaches   . Asthma   . Headache(784.0)   . Infection     UTI    Past Surgical History  Procedure Laterality Date  . No past surgeries      Family History  Problem Relation Age of Onset  . Migraines Mother   . Asthma Mother   . Migraines Father   . Heart disease Father   . Asthma Daughter   . Cancer Maternal Grandmother     History  Substance Use Topics  . Smoking status: Never Smoker   . Smokeless tobacco: Never Used  . Alcohol Use: No     Comment: occasional on holidays    Allergies:  Allergies  Allergen Reactions  . Ibuprofen Other (See Comments)    Ulcer flare up     Prescriptions prior to admission  Medication Sig Dispense Refill Last Dose  . Prenatal Vit-Fe Fumarate-FA (PRENATAL MULTIVITAMIN) TABS tablet Take 1 tablet by mouth daily at 12 noon.   05/22/2014 at Unknown time  . albuterol (PROVENTIL HFA;VENTOLIN HFA) 108 (90 BASE) MCG/ACT inhaler Inhale 1-2 puffs into the lungs every 6 (six) hours as needed for wheezing or shortness of breath. 1 Inhaler 3 05/18/2014  . oxyCODONE-acetaminophen (PERCOCET/ROXICET)  5-325 MG per tablet Take 1-2 tablets by mouth every 4 (four) hours as needed for severe pain. 20 tablet 0 Not Taking    Review of Systems  Constitutional: Negative for fever and chills.  HENT: Negative for hearing loss.   Respiratory: Negative for shortness of breath.   Cardiovascular: Negative for chest pain.  Gastrointestinal: Negative for nausea, vomiting, abdominal pain and diarrhea.  Genitourinary: Negative for dysuria, urgency, frequency, hematuria and flank pain.   Physical Exam   Blood pressure 140/89, pulse 91, temperature 98.8 F (37.1 C), resp. rate 18, last menstrual period 09/18/2013.  Physical Exam  Constitutional: She is oriented to person, place, and time. She appears well-developed and well-nourished. No distress.  HENT:  Head: Normocephalic and atraumatic.  Cardiovascular: Normal rate.   Respiratory: Effort normal. No respiratory distress.  GI: She exhibits no distension. There is no tenderness.  Genitourinary: Vagina normal.  Cervix (1415): 1, thick, high Repeat cervical exam unchanged  Musculoskeletal: She exhibits no edema or tenderness.  Neurological: She is alert and oriented to person, place, and time.    MAU Course  Procedures  MDM NST: baseline 150, reactive, accels present, no decels Rare and irregular UCs on toco Cervix exam reassuring  Assessment and Plan  Patient is 33 y.o. [id number] 3024w2d reporting vaginal pressure  likely secondary to braxton hicks contractions. Also with constipation. - fetal kick counts reinforced - preterm labor precautions - Colace and glycerin suppositories (per patient request) for constipation  [name], Angela 05/23/2014, 1:43 PM   I examined pt and agree with documentation above and resident plan of care. Eino FarberWalidah Kennith GainN Karim, CNM

## 2014-05-25 ENCOUNTER — Ambulatory Visit (INDEPENDENT_AMBULATORY_CARE_PROVIDER_SITE_OTHER): Payer: Medicaid Other | Admitting: Obstetrics and Gynecology

## 2014-05-25 VITALS — BP 132/81 | HR 103 | Wt 214.1 lb

## 2014-05-25 DIAGNOSIS — O9989 Other specified diseases and conditions complicating pregnancy, childbirth and the puerperium: Secondary | ICD-10-CM

## 2014-05-25 DIAGNOSIS — Z3493 Encounter for supervision of normal pregnancy, unspecified, third trimester: Secondary | ICD-10-CM

## 2014-05-25 DIAGNOSIS — J45909 Unspecified asthma, uncomplicated: Secondary | ICD-10-CM

## 2014-05-25 DIAGNOSIS — O99519 Diseases of the respiratory system complicating pregnancy, unspecified trimester: Secondary | ICD-10-CM

## 2014-05-25 LAB — POCT URINALYSIS DIP (DEVICE)
Bilirubin Urine: NEGATIVE
Glucose, UA: NEGATIVE mg/dL
Hgb urine dipstick: NEGATIVE
KETONES UR: NEGATIVE mg/dL
Leukocytes, UA: NEGATIVE
NITRITE: NEGATIVE
PH: 8.5 — AB (ref 5.0–8.0)
Protein, ur: NEGATIVE mg/dL
Specific Gravity, Urine: 1.015 (ref 1.005–1.030)
Urobilinogen, UA: 0.2 mg/dL (ref 0.0–1.0)

## 2014-05-25 LAB — OB RESULTS CONSOLE GC/CHLAMYDIA
Chlamydia: NEGATIVE
Gonorrhea: NEGATIVE

## 2014-05-25 LAB — OB RESULTS CONSOLE GBS: STREP GROUP B AG: NEGATIVE

## 2014-05-25 NOTE — Progress Notes (Signed)
Pt complains of contractions/vaginal pressure for the past week.

## 2014-05-25 NOTE — Patient Instructions (Signed)
Third Trimester of Pregnancy The third trimester is from week 29 through week 42, months 7 through 9. The third trimester is a time when the fetus is growing rapidly. At the end of the ninth month, the fetus is about 20 inches in length and weighs 6-10 pounds.  BODY CHANGES Your body goes through many changes during pregnancy. The changes vary from woman to woman.   Your weight will continue to increase. You can expect to gain 25-35 pounds (11-16 kg) by the end of the pregnancy.  You may begin to get stretch marks on your hips, abdomen, and breasts.  You may urinate more often because the fetus is moving lower into your pelvis and pressing on your bladder.  You may develop or continue to have heartburn as a result of your pregnancy.  You may develop constipation because certain hormones are causing the muscles that push waste through your intestines to slow down.  You may develop hemorrhoids or swollen, bulging veins (varicose veins).  You may have pelvic pain because of the weight gain and pregnancy hormones relaxing your joints between the bones in your pelvis. Backaches may result from overexertion of the muscles supporting your posture.  You may have changes in your hair. These can include thickening of your hair, rapid growth, and changes in texture. Some women also have hair loss during or after pregnancy, or hair that feels dry or thin. Your hair will most likely return to normal after your baby is born.  Your breasts will continue to grow and be tender. A yellow discharge may leak from your breasts called colostrum.  Your belly button may stick out.  You may feel short of breath because of your expanding uterus.  You may notice the fetus "dropping," or moving lower in your abdomen.  You may have a bloody mucus discharge. This usually occurs a few days to a week before labor begins.  Your cervix becomes thin and soft (effaced) near your due date. WHAT TO EXPECT AT YOUR PRENATAL  EXAMS  You will have prenatal exams every 2 weeks until week 36. Then, you will have weekly prenatal exams. During a routine prenatal visit:  You will be weighed to make sure you and the fetus are growing normally.  Your blood pressure is taken.  Your abdomen will be measured to track your baby's growth.  The fetal heartbeat will be listened to.  Any test results from the previous visit will be discussed.  You may have a cervical check near your due date to see if you have effaced. At around 36 weeks, your caregiver will check your cervix. At the same time, your caregiver will also perform a test on the secretions of the vaginal tissue. This test is to determine if a type of bacteria, Group B streptococcus, is present. Your caregiver will explain this further. Your caregiver may ask you:  What your birth plan is.  How you are feeling.  If you are feeling the baby move.  If you have had any abnormal symptoms, such as leaking fluid, bleeding, severe headaches, or abdominal cramping.  If you have any questions. Other tests or screenings that may be performed during your third trimester include:  Blood tests that check for low iron levels (anemia).  Fetal testing to check the health, activity level, and growth of the fetus. Testing is done if you have certain medical conditions or if there are problems during the pregnancy. FALSE LABOR You may feel small, irregular contractions that   eventually go away. These are called Braxton Hicks contractions, or false labor. Contractions may last for hours, days, or even weeks before true labor sets in. If contractions come at regular intervals, intensify, or become painful, it is best to be seen by your caregiver.  SIGNS OF LABOR   Menstrual-like cramps.  Contractions that are 5 minutes apart or less.  Contractions that start on the top of the uterus and spread down to the lower abdomen and back.  A sense of increased pelvic pressure or back  pain.  A watery or bloody mucus discharge that comes from the vagina. If you have any of these signs before the 37th week of pregnancy, call your caregiver right away. You need to go to the hospital to get checked immediately. HOME CARE INSTRUCTIONS   Avoid all smoking, herbs, alcohol, and unprescribed drugs. These chemicals affect the formation and growth of the baby.  Follow your caregiver's instructions regarding medicine use. There are medicines that are either safe or unsafe to take during pregnancy.  Exercise only as directed by your caregiver. Experiencing uterine cramps is a good sign to stop exercising.  Continue to eat regular, healthy meals.  Wear a good support bra for breast tenderness.  Do not use hot tubs, steam rooms, or saunas.  Wear your seat belt at all times when driving.  Avoid raw meat, uncooked cheese, cat litter boxes, and soil used by cats. These carry germs that can cause birth defects in the baby.  Take your prenatal vitamins.  Try taking a stool softener (if your caregiver approves) if you develop constipation. Eat more high-fiber foods, such as fresh vegetables or fruit and whole grains. Drink plenty of fluids to keep your urine clear or pale yellow.  Take warm sitz baths to soothe any pain or discomfort caused by hemorrhoids. Use hemorrhoid cream if your caregiver approves.  If you develop varicose veins, wear support hose. Elevate your feet for 15 minutes, 3-4 times a day. Limit salt in your diet.  Avoid heavy lifting, wear low heal shoes, and practice good posture.  Rest a lot with your legs elevated if you have leg cramps or low back pain.  Visit your dentist if you have not gone during your pregnancy. Use a soft toothbrush to brush your teeth and be gentle when you floss.  A sexual relationship may be continued unless your caregiver directs you otherwise.  Do not travel far distances unless it is absolutely necessary and only with the approval  of your caregiver.  Take prenatal classes to understand, practice, and ask questions about the labor and delivery.  Make a trial run to the hospital.  Pack your hospital bag.  Prepare the baby's nursery.  Continue to go to all your prenatal visits as directed by your caregiver. SEEK MEDICAL CARE IF:  You are unsure if you are in labor or if your water has broken.  You have dizziness.  You have mild pelvic cramps, pelvic pressure, or nagging pain in your abdominal area.  You have persistent nausea, vomiting, or diarrhea.  You have a bad smelling vaginal discharge.  You have pain with urination. SEEK IMMEDIATE MEDICAL CARE IF:   You have a fever.  You are leaking fluid from your vagina.  You have spotting or bleeding from your vagina.  You have severe abdominal cramping or pain.  You have rapid weight loss or gain.  You have shortness of breath with chest pain.  You notice sudden or extreme swelling   of your face, hands, ankles, feet, or legs.  You have not felt your baby move in over an hour.  You have severe headaches that do not go away with medicine.  You have vision changes. Document Released: 06/12/2001 Document Revised: 06/23/2013 Document Reviewed: 08/19/2012 ExitCare Patient Information 2015 [employer]. This information is not intended to replace advice given to you by your health care provider. Make sure you discuss any questions you have with your health care provider.  

## 2014-05-25 NOTE — Progress Notes (Signed)
Seen MAU 11/22 for pressure and constipation. BP was 140/86. Fatigued. No H/A, visual sx, epigastric pain. Constipation resolved. Does not want to try breastfeeding. Plans Depo. PreE and labor precautions.

## 2014-05-26 LAB — GC/CHLAMYDIA PROBE AMP
CT Probe RNA: NEGATIVE
GC Probe RNA: NEGATIVE

## 2014-05-27 LAB — CULTURE, BETA STREP (GROUP B ONLY)

## 2014-05-31 ENCOUNTER — Encounter (HOSPITAL_COMMUNITY): Payer: Self-pay

## 2014-05-31 ENCOUNTER — Inpatient Hospital Stay (HOSPITAL_COMMUNITY)
Admission: AD | Admit: 2014-05-31 | Discharge: 2014-05-31 | Disposition: A | Discharge status: Home / Self Care | Payer: Medicaid Other | Source: Ambulatory Visit | Attending: Obstetrics & Gynecology | Admitting: Obstetrics & Gynecology

## 2014-05-31 DIAGNOSIS — O26893 Other specified pregnancy related conditions, third trimester: Secondary | ICD-10-CM

## 2014-05-31 DIAGNOSIS — Z3A36 36 weeks gestation of pregnancy: Secondary | ICD-10-CM | POA: Insufficient documentation

## 2014-05-31 DIAGNOSIS — O9989 Other specified diseases and conditions complicating pregnancy, childbirth and the puerperium: Secondary | ICD-10-CM | POA: Diagnosis not present

## 2014-05-31 DIAGNOSIS — K589 Irritable bowel syndrome without diarrhea: Secondary | ICD-10-CM | POA: Diagnosis not present

## 2014-05-31 DIAGNOSIS — K219 Gastro-esophageal reflux disease without esophagitis: Secondary | ICD-10-CM | POA: Diagnosis not present

## 2014-05-31 DIAGNOSIS — R109 Unspecified abdominal pain: Secondary | ICD-10-CM | POA: Diagnosis present

## 2014-05-31 DIAGNOSIS — N949 Unspecified condition associated with female genital organs and menstrual cycle: Secondary | ICD-10-CM

## 2014-05-31 DIAGNOSIS — O99613 Diseases of the digestive system complicating pregnancy, third trimester: Secondary | ICD-10-CM | POA: Diagnosis not present

## 2014-05-31 LAB — URINALYSIS, ROUTINE W REFLEX MICROSCOPIC
Bilirubin Urine: NEGATIVE
GLUCOSE, UA: NEGATIVE mg/dL
HGB URINE DIPSTICK: NEGATIVE
Ketones, ur: NEGATIVE mg/dL
LEUKOCYTES UA: NEGATIVE
Nitrite: NEGATIVE
PH: 8.5 — AB (ref 5.0–8.0)
Protein, ur: NEGATIVE mg/dL
SPECIFIC GRAVITY, URINE: 1.015 (ref 1.005–1.030)
Urobilinogen, UA: 0.2 mg/dL (ref 0.0–1.0)

## 2014-05-31 MED ORDER — CYCLOBENZAPRINE HCL 5 MG PO TABS: 5.0000 mg | ORAL_TABLET | Freq: Three times a day (TID) | ORAL | Status: DC | PRN

## 2014-05-31 NOTE — MAU Provider Note (Signed)
History   [name] is a 33 yo, 415w3d, G3P2002, presenting to the MAU today with complaints of groin, low back, and suprapubic pain for the last 4-5 days. She describes the pain as an intermittent throbbing, which is worse at night and when she tries to change positions in bed. She takes 1/2 doses of Tylenol 3 which provides temporary moderate relief. She has also complained of increased urinary urgency for several weeks. She denies discharge or bleeding but notes a clear fluid spotting her underwear after urinating which she believes is urine. She has been intermittently experiencing braxton Hicks contractions which have not become regular. She denies abdominal pain, changes in vision, changes in sensation, and CP/SOB.    CSN: [id number]  Arrival date and time: 05/31/14 [id number]   None     Chief Complaint  Patient presents with  . Abdominal Pain  . Vaginal Discharge   HPI    Past Medical History  Diagnosis Date  . GERD (gastroesophageal reflux disease)   . Erosive esophagitis     distal esophagus, with low grade bleed 03/2009  . IBS (irritable bowel syndrome)   . Anemia   . History of migraine headaches   . Asthma   . Headache(784.0)   . Infection     UTI    Past Surgical History  Procedure Laterality Date  . No past surgeries      Family History  Problem Relation Age of Onset  . Migraines Mother   . Asthma Mother   . Migraines Father   . Heart disease Father   . Asthma Daughter   . Cancer Maternal Grandmother     History  Substance Use Topics  . Smoking status: Never Smoker   . Smokeless tobacco: Never Used  . Alcohol Use: No     Comment: occasional on holidays    Allergies:  Allergies  Allergen Reactions  . Ibuprofen Other (See Comments)    Ulcer flare up     Prescriptions prior to admission  Medication Sig Dispense Refill Last Dose  . acetaminophen-codeine (TYLENOL #3) 300-30 MG per tablet Take 0.5 tablets by mouth every 6 (six) hours as needed for  moderate pain.   05/30/2014 at Unknown time  . glycerin adult (GLYCERIN ADULT) 2 G SUPP Place 1 suppository rectally once as needed for moderate constipation. 10 each 0 Past Week at Unknown time  . Prenatal Vit-Fe Fumarate-FA (PRENATAL MULTIVITAMIN) TABS tablet Take 1 tablet by mouth daily at 12 noon.   05/30/2014 at Unknown time  . albuterol (PROVENTIL HFA;VENTOLIN HFA) 108 (90 BASE) MCG/ACT inhaler Inhale 1-2 puffs into the lungs every 6 (six) hours as needed for wheezing or shortness of breath. 1 Inhaler 3 2 weeks  . docusate sodium (COLACE) 50 MG capsule Take 1 capsule (50 mg total) by mouth 2 (two) times daily. (Patient not taking: Reported on 05/31/2014) 60 capsule 0 Taking    Review of Systems  Constitutional: Negative for fever and chills.  Eyes: Negative for blurred vision and double vision.  Respiratory: Negative for cough.   Cardiovascular: Negative for chest pain, palpitations and orthopnea.  Gastrointestinal: Positive for heartburn. Negative for nausea, vomiting and diarrhea.  Genitourinary: Positive for urgency. Negative for dysuria and frequency.  Neurological: Negative for headaches.   Physical Exam   Blood pressure 122/73, pulse 98, temperature 97.5 F (36.4 C), temperature source Oral, resp. rate 18, height 5\' 6"  (1.676 m), weight 98.975 kg (218 lb 3.2 oz), last menstrual period 09/18/2013.  Physical Exam  Constitutional: She is oriented to person, place, and time. She appears well-developed and well-nourished.  Cardiovascular: Normal rate, regular rhythm and normal heart sounds.   Respiratory: Effort normal and breath sounds normal. No respiratory distress. She has no wheezes.  Musculoskeletal:  Mild tenderness when assessing for CVA tenderness, although unlikely from peritoneal pain.  Neurological: She is alert and oriented to person, place, and time.  Skin: Skin is warm and dry. She is not diaphoretic.   Results for orders placed or performed during the hospital  encounter of 05/31/14 (from the past 24 hour(s))  Urinalysis, Routine w reflex microscopic     Status: Abnormal   Collection Time: 05/31/14  9:53 AM  Result Value Ref Range   Color, Urine YELLOW YELLOW   APPearance CLOUDY (A) CLEAR   Specific Gravity, Urine 1.015 1.005 - 1.030   pH 8.5 (H) 5.0 - 8.0   Glucose, UA NEGATIVE NEGATIVE mg/dL   Hgb urine dipstick NEGATIVE NEGATIVE   Bilirubin Urine NEGATIVE NEGATIVE   Ketones, ur NEGATIVE NEGATIVE mg/dL   Protein, ur NEGATIVE NEGATIVE mg/dL   Urobilinogen, UA 0.2 0.0 - 1.0 mg/dL   Nitrite NEGATIVE NEGATIVE   Leukocytes, UA NEGATIVE NEGATIVE    MAU Course  Procedures  MDM -U/A to r/o UTI, which was negative  Assessment and Plan  1.IUP 2. Ligament pain from pregnancy  -Advised to follow up with next scheduled prenatal visit which is tomorrow. -Educated on active labor contractions -Flexaryl and tylenol for pain  Knox RoyaltyRamirez, [name] D 05/31/2014, 11:15 AM    OB fellow attestation:  I have seen and examined this patient; I agree with above documentation in the PA student's note.   [name] is a 33 y.o. G3P2002 reporting groin, back, suprapubic pain.  Also reporting rare contractions  +FM, denies LOF, VB, contractions, vaginal discharge.  PE: BP 122/73 mmHg  Pulse 98  Temp(Src) 97.5 F (36.4 C) (Oral)  Resp 18  Ht 5\' 6"  (1.676 m)  Wt 218 lb 3.2 oz (98.975 kg)  BMI 35.24 kg/m2  LMP 09/18/2013 Gen: calm comfortable, NAD Resp: normal effort, no distress Abd: gravid  ROS, labs, PMH reviewed NST reactive  UA: normal Dilation: Closed Effacement (%): Thick Cervical Position: Posterior Station: Ballotable Presentation: Vertex Exam by:: SBeck, RN   Patient is 33 y.o. [id number] 2960w3d reporting pain likely secondary to round ligament pain, discomforts of pregnancy - fetal kick counts reinforced,  labor precautions - continue routine follow up in OB clinic - rx flexeril, tylenol prn for pain, warm  compresses  [name] Pavon ROCIO, MD 11:41 AM

## 2014-05-31 NOTE — Discharge Instructions (Signed)

## 2014-05-31 NOTE — MAU Note (Signed)
Patient states she has had constant lower abdominal pain for a couple of days, betting worse. Has had a watery discharge for 2 days. Reports good fetal movement.

## 2014-05-31 NOTE — Progress Notes (Signed)
Dr. Loreta AveAcosta notified of pt in MAU with c/o suprapubic pain. Cervix closed, ui noted, u/a pending. Will come see pt in MAU.

## 2014-06-01 ENCOUNTER — Ambulatory Visit (INDEPENDENT_AMBULATORY_CARE_PROVIDER_SITE_OTHER): Payer: Medicaid Other | Admitting: Obstetrics and Gynecology

## 2014-06-01 VITALS — BP 120/76 | HR 98 | Wt 216.2 lb

## 2014-06-01 DIAGNOSIS — O9989 Other specified diseases and conditions complicating pregnancy, childbirth and the puerperium: Secondary | ICD-10-CM

## 2014-06-01 DIAGNOSIS — J45909 Unspecified asthma, uncomplicated: Secondary | ICD-10-CM

## 2014-06-01 DIAGNOSIS — O99519 Diseases of the respiratory system complicating pregnancy, unspecified trimester: Principal | ICD-10-CM

## 2014-06-01 DIAGNOSIS — Z3493 Encounter for supervision of normal pregnancy, unspecified, third trimester: Secondary | ICD-10-CM

## 2014-06-01 LAB — POCT URINALYSIS DIP (DEVICE)
BILIRUBIN URINE: NEGATIVE
GLUCOSE, UA: NEGATIVE mg/dL
Ketones, ur: NEGATIVE mg/dL
Leukocytes, UA: NEGATIVE
Nitrite: NEGATIVE
Protein, ur: NEGATIVE mg/dL
SPECIFIC GRAVITY, URINE: 1.02 (ref 1.005–1.030)
Urobilinogen, UA: 0.2 mg/dL (ref 0.0–1.0)
pH: 6.5 (ref 5.0–8.0)

## 2014-06-01 NOTE — Progress Notes (Signed)
Seen MAU yesterday for RLP. Flexeril and support girdle helping. No concerns. Constipation resolved. Plans Depo.  Labor precautions

## 2014-06-01 NOTE — Patient Instructions (Signed)
Third Trimester of Pregnancy The third trimester is from week 29 through week 42, months 7 through 9. The third trimester is a time when the fetus is growing rapidly. At the end of the ninth month, the fetus is about 20 inches in length and weighs 6-10 pounds.  BODY CHANGES Your body goes through many changes during pregnancy. The changes vary from woman to woman.   Your weight will continue to increase. You can expect to gain 25-35 pounds (11-16 kg) by the end of the pregnancy.  You may begin to get stretch marks on your hips, abdomen, and breasts.  You may urinate more often because the fetus is moving lower into your pelvis and pressing on your bladder.  You may develop or continue to have heartburn as a result of your pregnancy.  You may develop constipation because certain hormones are causing the muscles that push waste through your intestines to slow down.  You may develop hemorrhoids or swollen, bulging veins (varicose veins).  You may have pelvic pain because of the weight gain and pregnancy hormones relaxing your joints between the bones in your pelvis. Backaches may result from overexertion of the muscles supporting your posture.  You may have changes in your hair. These can include thickening of your hair, rapid growth, and changes in texture. Some women also have hair loss during or after pregnancy, or hair that feels dry or thin. Your hair will most likely return to normal after your baby is born.  Your breasts will continue to grow and be tender. A yellow discharge may leak from your breasts called colostrum.  Your belly button may stick out.  You may feel short of breath because of your expanding uterus.  You may notice the fetus "dropping," or moving lower in your abdomen.  You may have a bloody mucus discharge. This usually occurs a few days to a week before labor begins.  Your cervix becomes thin and soft (effaced) near your due date. WHAT TO EXPECT AT YOUR PRENATAL  EXAMS  You will have prenatal exams every 2 weeks until week 36. Then, you will have weekly prenatal exams. During a routine prenatal visit:  You will be weighed to make sure you and the fetus are growing normally.  Your blood pressure is taken.  Your abdomen will be measured to track your baby's growth.  The fetal heartbeat will be listened to.  Any test results from the previous visit will be discussed.  You may have a cervical check near your due date to see if you have effaced. At around 36 weeks, your caregiver will check your cervix. At the same time, your caregiver will also perform a test on the secretions of the vaginal tissue. This test is to determine if a type of bacteria, Group B streptococcus, is present. Your caregiver will explain this further. Your caregiver may ask you:  What your birth plan is.  How you are feeling.  If you are feeling the baby move.  If you have had any abnormal symptoms, such as leaking fluid, bleeding, severe headaches, or abdominal cramping.  If you have any questions. Other tests or screenings that may be performed during your third trimester include:  Blood tests that check for low iron levels (anemia).  Fetal testing to check the health, activity level, and growth of the fetus. Testing is done if you have certain medical conditions or if there are problems during the pregnancy. FALSE LABOR You may feel small, irregular contractions that   eventually go away. These are called Braxton Hicks contractions, or false labor. Contractions may last for hours, days, or even weeks before true labor sets in. If contractions come at regular intervals, intensify, or become painful, it is best to be seen by your caregiver.  SIGNS OF LABOR   Menstrual-like cramps.  Contractions that are 5 minutes apart or less.  Contractions that start on the top of the uterus and spread down to the lower abdomen and back.  A sense of increased pelvic pressure or back  pain.  A watery or bloody mucus discharge that comes from the vagina. If you have any of these signs before the 37th week of pregnancy, call your caregiver right away. You need to go to the hospital to get checked immediately. HOME CARE INSTRUCTIONS   Avoid all smoking, herbs, alcohol, and unprescribed drugs. These chemicals affect the formation and growth of the baby.  Follow your caregiver's instructions regarding medicine use. There are medicines that are either safe or unsafe to take during pregnancy.  Exercise only as directed by your caregiver. Experiencing uterine cramps is a good sign to stop exercising.  Continue to eat regular, healthy meals.  Wear a good support bra for breast tenderness.  Do not use hot tubs, steam rooms, or saunas.  Wear your seat belt at all times when driving.  Avoid raw meat, uncooked cheese, cat litter boxes, and soil used by cats. These carry germs that can cause birth defects in the baby.  Take your prenatal vitamins.  Try taking a stool softener (if your caregiver approves) if you develop constipation. Eat more high-fiber foods, such as fresh vegetables or fruit and whole grains. Drink plenty of fluids to keep your urine clear or pale yellow.  Take warm sitz baths to soothe any pain or discomfort caused by hemorrhoids. Use hemorrhoid cream if your caregiver approves.  If you develop varicose veins, wear support hose. Elevate your feet for 15 minutes, 3-4 times a day. Limit salt in your diet.  Avoid heavy lifting, wear low heal shoes, and practice good posture.  Rest a lot with your legs elevated if you have leg cramps or low back pain.  Visit your dentist if you have not gone during your pregnancy. Use a soft toothbrush to brush your teeth and be gentle when you floss.  A sexual relationship may be continued unless your caregiver directs you otherwise.  Do not travel far distances unless it is absolutely necessary and only with the approval  of your caregiver.  Take prenatal classes to understand, practice, and ask questions about the labor and delivery.  Make a trial run to the hospital.  Pack your hospital bag.  Prepare the baby's nursery.  Continue to go to all your prenatal visits as directed by your caregiver. SEEK MEDICAL CARE IF:  You are unsure if you are in labor or if your water has broken.  You have dizziness.  You have mild pelvic cramps, pelvic pressure, or nagging pain in your abdominal area.  You have persistent nausea, vomiting, or diarrhea.  You have a bad smelling vaginal discharge.  You have pain with urination. SEEK IMMEDIATE MEDICAL CARE IF:   You have a fever.  You are leaking fluid from your vagina.  You have spotting or bleeding from your vagina.  You have severe abdominal cramping or pain.  You have rapid weight loss or gain.  You have shortness of breath with chest pain.  You notice sudden or extreme swelling   of your face, hands, ankles, feet, or legs.  You have not felt your baby move in over an hour.  You have severe headaches that do not go away with medicine.  You have vision changes. Document Released: 06/12/2001 Document Revised: 06/23/2013 Document Reviewed: 08/19/2012 ExitCare Patient Information 2015 [employer]. This information is not intended to replace advice given to you by your health care provider. Make sure you discuss any questions you have with your health care provider.  

## 2014-06-01 NOTE — Progress Notes (Signed)
C/o of intermittent pelvic pressure/pain with irregular contractions.  Edema in hands and feet.

## 2014-06-07 ENCOUNTER — Inpatient Hospital Stay (HOSPITAL_COMMUNITY)
Admission: AD | Admit: 2014-06-07 | Discharge: 2014-06-07 | Disposition: A | Discharge status: Home / Self Care | Payer: Medicaid Other | Source: Ambulatory Visit | Attending: Family Medicine | Admitting: Family Medicine

## 2014-06-07 DIAGNOSIS — Z3A37 37 weeks gestation of pregnancy: Secondary | ICD-10-CM | POA: Diagnosis not present

## 2014-06-07 DIAGNOSIS — O9989 Other specified diseases and conditions complicating pregnancy, childbirth and the puerperium: Secondary | ICD-10-CM | POA: Insufficient documentation

## 2014-06-07 DIAGNOSIS — M549 Dorsalgia, unspecified: Secondary | ICD-10-CM | POA: Insufficient documentation

## 2014-06-07 DIAGNOSIS — R109 Unspecified abdominal pain: Secondary | ICD-10-CM | POA: Diagnosis present

## 2014-06-07 LAB — URINALYSIS, ROUTINE W REFLEX MICROSCOPIC
BILIRUBIN URINE: NEGATIVE
Glucose, UA: NEGATIVE mg/dL
KETONES UR: NEGATIVE mg/dL
Leukocytes, UA: NEGATIVE
NITRITE: NEGATIVE
PROTEIN: NEGATIVE mg/dL
Specific Gravity, Urine: >1.03 — ABNORMAL HIGH (ref 1.005–1.030)
Urobilinogen, UA: 0.2 mg/dL (ref 0.0–1.0)
pH: 6 (ref 5.0–8.0)

## 2014-06-07 LAB — URINE MICROSCOPIC-ADD ON

## 2014-06-07 NOTE — MAU Note (Signed)
Cramping like period is going to start.  Started 2 days.  No bleeding.? Leaking- clear spots noted in underwear.  Not sleeping, back aches, pain is worse at night.  Has had several loose stools today.  Feeling a lot of pelvic pressure.

## 2014-06-07 NOTE — Discharge Instructions (Signed)
Keep your scheduled appointment for prenatal care. Drink 8-10 glasses of water per day. °

## 2014-06-08 ENCOUNTER — Encounter: Payer: Self-pay | Admitting: Obstetrics and Gynecology

## 2014-06-08 ENCOUNTER — Ambulatory Visit (INDEPENDENT_AMBULATORY_CARE_PROVIDER_SITE_OTHER): Payer: Medicaid Other | Admitting: Obstetrics and Gynecology

## 2014-06-08 VITALS — BP 135/78 | HR 98 | Temp 97.2°F | Wt 218.0 lb

## 2014-06-08 DIAGNOSIS — O9989 Other specified diseases and conditions complicating pregnancy, childbirth and the puerperium: Secondary | ICD-10-CM

## 2014-06-08 DIAGNOSIS — Z3493 Encounter for supervision of normal pregnancy, unspecified, third trimester: Secondary | ICD-10-CM

## 2014-06-08 DIAGNOSIS — O99519 Diseases of the respiratory system complicating pregnancy, unspecified trimester: Secondary | ICD-10-CM

## 2014-06-08 DIAGNOSIS — J45909 Unspecified asthma, uncomplicated: Secondary | ICD-10-CM

## 2014-06-08 LAB — POCT URINALYSIS DIP (DEVICE)
Bilirubin Urine: NEGATIVE
GLUCOSE, UA: NEGATIVE mg/dL
HGB URINE DIPSTICK: NEGATIVE
Ketones, ur: NEGATIVE mg/dL
Leukocytes, UA: NEGATIVE
Nitrite: NEGATIVE
PROTEIN: NEGATIVE mg/dL
SPECIFIC GRAVITY, URINE: 1.02 (ref 1.005–1.030)
Urobilinogen, UA: 1 mg/dL (ref 0.0–1.0)
pH: 7 (ref 5.0–8.0)

## 2014-06-08 MED ORDER — ALBUTEROL SULFATE HFA 108 (90 BASE) MCG/ACT IN AERS: 2.0000 | INHALATION_SPRAY | Freq: Four times a day (QID) | RESPIRATORY_TRACT | Status: DC | PRN

## 2014-06-08 NOTE — Progress Notes (Signed)
Seen MAU last night for UCs. Mucus plug came out after that and requests VE. Discussed late gestational discomforts. Albuterol inhaler refilled, uses rarely.

## 2014-06-08 NOTE — Progress Notes (Signed)
Patient reports pelvic pressure/pain and occasional contractions

## 2014-06-08 NOTE — Patient Instructions (Signed)
Third Trimester of Pregnancy The third trimester is from week 29 through week 42, months 7 through 9. The third trimester is a time when the fetus is growing rapidly. At the end of the ninth month, the fetus is about 20 inches in length and weighs 6-10 pounds.  BODY CHANGES Your body goes through many changes during pregnancy. The changes vary from woman to woman.   Your weight will continue to increase. You can expect to gain 25-35 pounds (11-16 kg) by the end of the pregnancy.  You may begin to get stretch marks on your hips, abdomen, and breasts.  You may urinate more often because the fetus is moving lower into your pelvis and pressing on your bladder.  You may develop or continue to have heartburn as a result of your pregnancy.  You may develop constipation because certain hormones are causing the muscles that push waste through your intestines to slow down.  You may develop hemorrhoids or swollen, bulging veins (varicose veins).  You may have pelvic pain because of the weight gain and pregnancy hormones relaxing your joints between the bones in your pelvis. Backaches may result from overexertion of the muscles supporting your posture.  You may have changes in your hair. These can include thickening of your hair, rapid growth, and changes in texture. Some women also have hair loss during or after pregnancy, or hair that feels dry or thin. Your hair will most likely return to normal after your baby is born.  Your breasts will continue to grow and be tender. A yellow discharge may leak from your breasts called colostrum.  Your belly button may stick out.  You may feel short of breath because of your expanding uterus.  You may notice the fetus "dropping," or moving lower in your abdomen.  You may have a bloody mucus discharge. This usually occurs a few days to a week before labor begins.  Your cervix becomes thin and soft (effaced) near your due date. WHAT TO EXPECT AT YOUR PRENATAL  EXAMS  You will have prenatal exams every 2 weeks until week 36. Then, you will have weekly prenatal exams. During a routine prenatal visit:  You will be weighed to make sure you and the fetus are growing normally.  Your blood pressure is taken.  Your abdomen will be measured to track your baby's growth.  The fetal heartbeat will be listened to.  Any test results from the previous visit will be discussed.  You may have a cervical check near your due date to see if you have effaced. At around 36 weeks, your caregiver will check your cervix. At the same time, your caregiver will also perform a test on the secretions of the vaginal tissue. This test is to determine if a type of bacteria, Group B streptococcus, is present. Your caregiver will explain this further. Your caregiver may ask you:  What your birth plan is.  How you are feeling.  If you are feeling the baby move.  If you have had any abnormal symptoms, such as leaking fluid, bleeding, severe headaches, or abdominal cramping.  If you have any questions. Other tests or screenings that may be performed during your third trimester include:  Blood tests that check for low iron levels (anemia).  Fetal testing to check the health, activity level, and growth of the fetus. Testing is done if you have certain medical conditions or if there are problems during the pregnancy. FALSE LABOR You may feel small, irregular contractions that   eventually go away. These are called Braxton Hicks contractions, or false labor. Contractions may last for hours, days, or even weeks before true labor sets in. If contractions come at regular intervals, intensify, or become painful, it is best to be seen by your caregiver.  SIGNS OF LABOR   Menstrual-like cramps.  Contractions that are 5 minutes apart or less.  Contractions that start on the top of the uterus and spread down to the lower abdomen and back.  A sense of increased pelvic pressure or back  pain.  A watery or bloody mucus discharge that comes from the vagina. If you have any of these signs before the 37th week of pregnancy, call your caregiver right away. You need to go to the hospital to get checked immediately. HOME CARE INSTRUCTIONS   Avoid all smoking, herbs, alcohol, and unprescribed drugs. These chemicals affect the formation and growth of the baby.  Follow your caregiver's instructions regarding medicine use. There are medicines that are either safe or unsafe to take during pregnancy.  Exercise only as directed by your caregiver. Experiencing uterine cramps is a good sign to stop exercising.  Continue to eat regular, healthy meals.  Wear a good support bra for breast tenderness.  Do not use hot tubs, steam rooms, or saunas.  Wear your seat belt at all times when driving.  Avoid raw meat, uncooked cheese, cat litter boxes, and soil used by cats. These carry germs that can cause birth defects in the baby.  Take your prenatal vitamins.  Try taking a stool softener (if your caregiver approves) if you develop constipation. Eat more high-fiber foods, such as fresh vegetables or fruit and whole grains. Drink plenty of fluids to keep your urine clear or pale yellow.  Take warm sitz baths to soothe any pain or discomfort caused by hemorrhoids. Use hemorrhoid cream if your caregiver approves.  If you develop varicose veins, wear support hose. Elevate your feet for 15 minutes, 3-4 times a day. Limit salt in your diet.  Avoid heavy lifting, wear low heal shoes, and practice good posture.  Rest a lot with your legs elevated if you have leg cramps or low back pain.  Visit your dentist if you have not gone during your pregnancy. Use a soft toothbrush to brush your teeth and be gentle when you floss.  A sexual relationship may be continued unless your caregiver directs you otherwise.  Do not travel far distances unless it is absolutely necessary and only with the approval  of your caregiver.  Take prenatal classes to understand, practice, and ask questions about the labor and delivery.  Make a trial run to the hospital.  Pack your hospital bag.  Prepare the baby's nursery.  Continue to go to all your prenatal visits as directed by your caregiver. SEEK MEDICAL CARE IF:  You are unsure if you are in labor or if your water has broken.  You have dizziness.  You have mild pelvic cramps, pelvic pressure, or nagging pain in your abdominal area.  You have persistent nausea, vomiting, or diarrhea.  You have a bad smelling vaginal discharge.  You have pain with urination. SEEK IMMEDIATE MEDICAL CARE IF:   You have a fever.  You are leaking fluid from your vagina.  You have spotting or bleeding from your vagina.  You have severe abdominal cramping or pain.  You have rapid weight loss or gain.  You have shortness of breath with chest pain.  You notice sudden or extreme swelling   of your face, hands, ankles, feet, or legs.  You have not felt your baby move in over an hour.  You have severe headaches that do not go away with medicine.  You have vision changes. Document Released: 06/12/2001 Document Revised: 06/23/2013 Document Reviewed: 08/19/2012 ExitCare Patient Information 2015 [employer]. This information is not intended to replace advice given to you by your health care provider. Make sure you discuss any questions you have with your health care provider.  

## 2014-06-11 ENCOUNTER — Inpatient Hospital Stay (HOSPITAL_COMMUNITY)
Admission: AD | Admit: 2014-06-11 | Discharge: 2014-06-12 | Disposition: A | Discharge status: Home / Self Care | Payer: Medicaid Other | Source: Ambulatory Visit | Attending: Obstetrics and Gynecology | Admitting: Obstetrics and Gynecology

## 2014-06-11 ENCOUNTER — Encounter (HOSPITAL_COMMUNITY): Payer: Self-pay | Admitting: *Deleted

## 2014-06-11 DIAGNOSIS — O210 Mild hyperemesis gravidarum: Secondary | ICD-10-CM

## 2014-06-11 DIAGNOSIS — Z3A38 38 weeks gestation of pregnancy: Secondary | ICD-10-CM | POA: Insufficient documentation

## 2014-06-11 DIAGNOSIS — O99519 Diseases of the respiratory system complicating pregnancy, unspecified trimester: Secondary | ICD-10-CM

## 2014-06-11 DIAGNOSIS — O471 False labor at or after 37 completed weeks of gestation: Secondary | ICD-10-CM | POA: Insufficient documentation

## 2014-06-11 DIAGNOSIS — J45909 Unspecified asthma, uncomplicated: Secondary | ICD-10-CM

## 2014-06-11 NOTE — MAU Note (Signed)
Pt arrived EMS with c/o ctx. Ctx q 5 min for the past 30 min . Denies Vag bleeding or leaking reports mucusy discharge. Good fetal movement reported

## 2014-06-12 ENCOUNTER — Inpatient Hospital Stay (HOSPITAL_COMMUNITY)
Admission: AD | Admit: 2014-06-12 | Discharge: 2014-06-15 | DRG: 775 | Disposition: A | Discharge status: Home / Self Care | Payer: Medicaid Other | Source: Ambulatory Visit | Attending: Family Medicine | Admitting: Family Medicine

## 2014-06-12 ENCOUNTER — Inpatient Hospital Stay (HOSPITAL_COMMUNITY): Payer: Medicaid Other | Admitting: Anesthesiology

## 2014-06-12 ENCOUNTER — Encounter (HOSPITAL_COMMUNITY): Payer: Self-pay | Admitting: *Deleted

## 2014-06-12 DIAGNOSIS — O210 Mild hyperemesis gravidarum: Secondary | ICD-10-CM

## 2014-06-12 DIAGNOSIS — Z3483 Encounter for supervision of other normal pregnancy, third trimester: Secondary | ICD-10-CM | POA: Diagnosis present

## 2014-06-12 DIAGNOSIS — Z6835 Body mass index (BMI) 35.0-35.9, adult: Secondary | ICD-10-CM | POA: Diagnosis not present

## 2014-06-12 DIAGNOSIS — O471 False labor at or after 37 completed weeks of gestation: Secondary | ICD-10-CM | POA: Diagnosis present

## 2014-06-12 DIAGNOSIS — Z3A38 38 weeks gestation of pregnancy: Secondary | ICD-10-CM | POA: Diagnosis present

## 2014-06-12 DIAGNOSIS — O99214 Obesity complicating childbirth: Secondary | ICD-10-CM | POA: Diagnosis present

## 2014-06-12 DIAGNOSIS — O09523 Supervision of elderly multigravida, third trimester: Secondary | ICD-10-CM | POA: Diagnosis not present

## 2014-06-12 DIAGNOSIS — J45909 Unspecified asthma, uncomplicated: Secondary | ICD-10-CM

## 2014-06-12 DIAGNOSIS — O99519 Diseases of the respiratory system complicating pregnancy, unspecified trimester: Secondary | ICD-10-CM

## 2014-06-12 LAB — COMPREHENSIVE METABOLIC PANEL
ALK PHOS: 160 U/L — AB (ref 39–117)
ALT: 12 U/L (ref 0–35)
AST: 15 U/L (ref 0–37)
Albumin: 2.8 g/dL — ABNORMAL LOW (ref 3.5–5.2)
Anion gap: 14 (ref 5–15)
BILIRUBIN TOTAL: 0.4 mg/dL (ref 0.3–1.2)
BUN: 7 mg/dL (ref 6–23)
CHLORIDE: 101 meq/L (ref 96–112)
CO2: 19 mEq/L (ref 19–32)
Calcium: 8.7 mg/dL (ref 8.4–10.5)
Creatinine, Ser: 0.6 mg/dL (ref 0.50–1.10)
GFR calc Af Amer: >90 mL/min (ref >90)
GFR calc non Af Amer: >90 mL/min (ref >90)
Glucose, Bld: 95 mg/dL (ref 70–99)
POTASSIUM: 3.8 meq/L (ref 3.7–5.3)
Sodium: 134 mEq/L — ABNORMAL LOW (ref 137–147)
Total Protein: 6.6 g/dL (ref 6.0–8.3)

## 2014-06-12 LAB — CBC
HCT: 29 % — ABNORMAL LOW (ref 36.0–46.0)
Hemoglobin: 9.6 g/dL — ABNORMAL LOW (ref 12.0–15.0)
MCH: 28.2 pg (ref 26.0–34.0)
MCHC: 33.1 g/dL (ref 30.0–36.0)
MCV: 85 fL (ref 78.0–100.0)
Platelets: 314 10*3/uL (ref 150–400)
RBC: 3.41 MIL/uL — AB (ref 3.87–5.11)
RDW: 15 % (ref 11.5–15.5)
WBC: 8.5 10*3/uL (ref 4.0–10.5)

## 2014-06-12 LAB — RPR

## 2014-06-12 MED ORDER — PHENYLEPHRINE 40 MCG/ML (10ML) SYRINGE FOR IV PUSH (FOR BLOOD PRESSURE SUPPORT)
80.0000 ug | PREFILLED_SYRINGE | INTRAVENOUS | Status: DC | PRN
  Filled 2014-06-12: qty 2
  Filled 2014-06-12 (×2): qty 10

## 2014-06-12 MED ORDER — OXYCODONE-ACETAMINOPHEN 5-325 MG PO TABS
2.0000 | ORAL_TABLET | ORAL | Status: DC | PRN
  Administered 2014-06-13: 2 via ORAL
  Filled 2014-06-12: qty 2

## 2014-06-12 MED ORDER — FENTANYL CITRATE 0.05 MG/ML IJ SOLN
25.0000 ug | Freq: Once | INTRAMUSCULAR | Status: AC
  Administered 2014-06-12: 25 ug via INTRAVENOUS

## 2014-06-12 MED ORDER — FENTANYL 2.5 MCG/ML BUPIVACAINE 1/10 % EPIDURAL INFUSION (WH - ANES)
14.0000 mL/h | INTRAMUSCULAR | Status: DC | PRN
  Administered 2014-06-12 – 2014-06-13 (×3): 14 mL/h via EPIDURAL
  Filled 2014-06-12 (×3): qty 125

## 2014-06-12 MED ORDER — OXYTOCIN 40 UNITS IN LACTATED RINGERS INFUSION - SIMPLE MED
1.0000 m[IU]/min | INTRAVENOUS | Status: DC
  Administered 2014-06-12: 2 m[IU]/min via INTRAVENOUS
  Filled 2014-06-12: qty 1000

## 2014-06-12 MED ORDER — OXYTOCIN BOLUS FROM INFUSION
500.0000 mL | INTRAVENOUS | Status: DC
Start: 2014-06-12 — End: 2014-06-13
  Administered 2014-06-13: 500 mL via INTRAVENOUS

## 2014-06-12 MED ORDER — EPHEDRINE 5 MG/ML INJ
10.0000 mg | INTRAVENOUS | Status: DC | PRN
  Filled 2014-06-12: qty 2

## 2014-06-12 MED ORDER — LACTATED RINGERS IV SOLN
INTRAVENOUS | Status: DC
  Administered 2014-06-12 – 2014-06-13 (×3): via INTRAVENOUS

## 2014-06-12 MED ORDER — LIDOCAINE HCL (PF) 1 % IJ SOLN
30.0000 mL | INTRAMUSCULAR | Status: DC | PRN
  Filled 2014-06-12: qty 30

## 2014-06-12 MED ORDER — ONDANSETRON HCL 4 MG/2ML IJ SOLN
4.0000 mg | Freq: Four times a day (QID) | INTRAMUSCULAR | Status: DC | PRN
  Administered 2014-06-13 (×2): 4 mg via INTRAVENOUS
  Filled 2014-06-12 (×2): qty 2

## 2014-06-12 MED ORDER — FENTANYL CITRATE 0.05 MG/ML IJ SOLN
100.0000 ug | INTRAMUSCULAR | Status: DC | PRN
  Filled 2014-06-12: qty 2

## 2014-06-12 MED ORDER — CITRIC ACID-SODIUM CITRATE 334-500 MG/5ML PO SOLN
30.0000 mL | ORAL | Status: DC | PRN
  Administered 2014-06-13: 30 mL via ORAL
  Filled 2014-06-12 (×2): qty 15

## 2014-06-12 MED ORDER — ACETAMINOPHEN 325 MG PO TABS: 650.0000 mg | ORAL_TABLET | ORAL | Status: DC | PRN

## 2014-06-12 MED ORDER — LACTATED RINGERS IV SOLN
500.0000 mL | INTRAVENOUS | Status: DC | PRN
  Administered 2014-06-12: 500 mL via INTRAVENOUS

## 2014-06-12 MED ORDER — ALBUTEROL SULFATE (2.5 MG/3ML) 0.083% IN NEBU: INHALATION_SOLUTION | Freq: Four times a day (QID) | RESPIRATORY_TRACT | Status: DC | PRN

## 2014-06-12 MED ORDER — LACTATED RINGERS IV SOLN
500.0000 mL | Freq: Once | INTRAVENOUS | Status: AC
  Administered 2014-06-12: 500 mL via INTRAVENOUS

## 2014-06-12 MED ORDER — PHENYLEPHRINE 40 MCG/ML (10ML) SYRINGE FOR IV PUSH (FOR BLOOD PRESSURE SUPPORT)
80.0000 ug | PREFILLED_SYRINGE | INTRAVENOUS | Status: DC | PRN
  Filled 2014-06-12: qty 2

## 2014-06-12 MED ORDER — SODIUM CHLORIDE 0.9 % IV SOLN
14.0000 mL/h | INTRAVENOUS | Status: DC | PRN
  Filled 2014-06-12 (×2): qty 17

## 2014-06-12 MED ORDER — DIPHENHYDRAMINE HCL 50 MG/ML IJ SOLN: 12.5000 mg | INTRAMUSCULAR | Status: DC | PRN

## 2014-06-12 MED ORDER — ZOLPIDEM TARTRATE 5 MG PO TABS
5.0000 mg | ORAL_TABLET | Freq: Once | ORAL | Status: AC
  Administered 2014-06-12: 5 mg via ORAL
  Filled 2014-06-12: qty 1

## 2014-06-12 MED ORDER — OXYTOCIN 40 UNITS IN LACTATED RINGERS INFUSION - SIMPLE MED
62.5000 mL/h | INTRAVENOUS | Status: DC
  Filled 2014-06-12: qty 1000

## 2014-06-12 MED ORDER — OXYCODONE-ACETAMINOPHEN 5-325 MG PO TABS: 1.0000 | ORAL_TABLET | ORAL | Status: DC | PRN

## 2014-06-12 MED ORDER — TERBUTALINE SULFATE 1 MG/ML IJ SOLN: 0.2500 mg | Freq: Once | INTRAMUSCULAR | Status: AC | PRN

## 2014-06-12 MED ORDER — FLEET ENEMA 7-19 GM/118ML RE ENEM: 1.0000 | ENEMA | RECTAL | Status: DC | PRN

## 2014-06-12 NOTE — Progress Notes (Signed)
[name] is a 33 y.o. G3P2002 at 7553w1d by ultrasound admitted for active labor  Subjective: Pt reports significant amount of pain, but is continuing to have regular contractions.  Pt wants to try another trial of ambulation.    Objective: BP 116/58 mmHg  Pulse 85  Temp(Src) 97.7 F (36.5 C) (Oral)  Resp 18  Ht 5\' 6"  (1.676 m)  Wt 98.884 kg (218 lb)  BMI 35.20 kg/m2  LMP 09/18/2013      FHT:  FHR: 145 bpm, variability: minimal ,  accelerations:  Present,  decelerations:  Absent UC:   regular, every 3 minutes SVE:   Dilation: 3.5 Effacement (%): 50 Station: -2 Exam by:: [name] RNC   Labs: Lab Results  Component Value Date   WBC 8.5 06/12/2014   HGB 9.6* 06/12/2014   HCT 29.0* 06/12/2014   MCV 85.0 06/12/2014   PLT 314 06/12/2014    Assessment / Plan: Protracted latent phase  Labor: Protracted latent phase, pt to try another round of ambulation Preeclampsia:  no signs or symptoms of toxicity Fetal Wellbeing:  Category II Pain Control:  Labor support without medications I/D:  n/a Anticipated MOD:  NSVD  Benjamin Stainhompson, Starkeisha Vanwinkle L, MD 06/12/2014, 6:22 PM

## 2014-06-12 NOTE — MAU Note (Signed)
Pt presents to MAU with complaints of contractions that started yesterday. Pt was evaluated in MAU in the early morning around 2am and sent home. Denies any vaginal bleeding or LOF

## 2014-06-12 NOTE — Progress Notes (Signed)
Patient ID: [name] GasserFealethea D Szczerba, female   DOB: [birth year], 33 y.o.   MRN: [id number] Ceasar MonsFiled Vitals:   06/12/14 1727  BP: 116/58  Pulse: 85  Temp: 97.7 F (36.5 C)  Resp: 18    FHR reactive UCs spaced to every 4-8 min  Dilation: 4 Effacement (%): 70 Cervical Position: Posterior Station: -3 Presentation: Vertex Exam by:: Salah Burlison CNM   Little change in cervix over time Will discuss with team Will give tiny dose of pain med for now.

## 2014-06-12 NOTE — H&P (Signed)
[name] GasserFealethea D Archer is a 33 y.o. female presenting for contractions every 3 minutes.  Pt has rapidly dilated from 2.5 to 4cm in 1hr in the MAU.  Pt denies any bleeding, fluid leak, has had loss of part of her mucus plug.  Pt states her pain is moderate to severe and would like an epidural.     [facility]  Dating LMP Ultrasound consistent with LMP: Yes @ 6weeks  Genetic Screen 1 Screen: Desires, NT too late so pt desires NIPS  Anatomic US 18 wks nml  GTT Early: Third trimester: 4577  TDaP vaccine declined  Flu vaccine declined  GBS neg  Baby Food   Contraception   Circumcision   Pediatrician      Maternal Medical History:  Reason for admission: Contractions.   Contractions: Onset was 13-24 hours ago.   Frequency: regular.   Duration is approximately 30 seconds.   Perceived severity is moderate.    Fetal activity: Perceived fetal activity is normal.   Last perceived fetal movement was within the past hour.    Prenatal complications: PIH.   No bleeding, cholelithiasis, HIV or infection.   Prenatal Complications - Diabetes: none.    OB History    Gravida Para Term Preterm AB TAB SAB Ectopic Multiple Living   3 2 2       2      Past Medical History  Diagnosis Date  . GERD (gastroesophageal reflux disease)   . Erosive esophagitis     distal esophagus, with low grade bleed 03/2009  . IBS (irritable bowel syndrome)   . Anemia   . History of migraine headaches   . Asthma   . Headache(784.0)   . Infection     UTI   Past Surgical History  Procedure Laterality Date  . No past surgeries     Family History: family history includes Asthma in her daughter and mother; Cancer in her maternal grandmother; Heart disease in her father; Migraines in her father and mother. Social History:  reports that she has never smoked. She has never used smokeless tobacco. She reports that she does not drink alcohol or use illicit drugs.   Prenatal Transfer Tool   Maternal Diabetes: No Genetic Screening: Declined Maternal Ultrasounds/Referrals: Normal Fetal Ultrasounds or other Referrals:  None Maternal Substance Abuse:  Yes:  Type: Marijuana Significant Maternal Medications:  Meds include: Other: Flexeril Significant Maternal Lab Results:  None Other Comments:  None  Review of Systems  Constitutional: Negative for fever and chills.  HENT: Negative.   Eyes: Negative.   Respiratory: Negative.   Cardiovascular: Negative.   Gastrointestinal: Negative.   Genitourinary: Negative.   Musculoskeletal: Positive for back pain.  Skin: Negative.   Neurological: Negative.   Endo/Heme/Allergies: Negative.     Dilation: 4 Effacement (%): 70 Station: -3 Exam by:: A. Gagliardo, RN Blood pressure 118/79, pulse 95, temperature 97.9 F (36.6 C), temperature source Oral, resp. rate 18, last menstrual period 09/18/2013. Maternal Exam:  Uterine Assessment: Contraction strength is moderate.  Contraction frequency is regular.   Abdomen: Patient reports no abdominal tenderness. Fetal presentation: vertex  Introitus: Normal vulva. Normal vagina.  Ferning test: not done.  Nitrazine test: not done. Amniotic fluid character: not assessed.  Pelvis: adequate for delivery.   Cervix: Cervix evaluated by digital exam.     Physical Exam  Constitutional: She is oriented to person, place, and time. She appears well-developed and well-nourished.  HENT:  Head: Normocephalic and atraumatic.  Eyes: Conjunctivae and EOM are  normal. Pupils are equal, round, and reactive to light.  Neck: Normal range of motion. Neck supple.  Cardiovascular: Normal rate and regular rhythm.   Respiratory: Effort normal.  GI: Soft.  Genitourinary: Vagina normal and uterus normal.  Musculoskeletal: Normal range of motion.  Neurological: She is alert and oriented to person, place, and time. She has normal reflexes.  Skin: Skin is warm and dry.  Psychiatric: She has a normal mood and  affect.    Prenatal labs: ABO, Rh: O/POS/-- (05/05 1700) Antibody: NEG (05/05 1700) Rubella: 3.82 (05/05 1700) RPR: NON REAC (09/22 1228)  HBsAg: NEGATIVE (05/05 1700)  HIV: NONREACTIVE (05/05 1700)  GBS: Negative (11/24 0000)   Assessment/Plan: Pt is in active labor and will be admitted to the L&D for anticipation of NSVD, Category I tracing.       Maurie Boettcherhompson, McKenzie L 06/12/2014, 1:29 PM  Seen also by me Agree with note Will ambulate and look for increased labor Aviva SignsMarie L Marley Archer, CNM

## 2014-06-12 NOTE — Progress Notes (Addendum)
LABOR PROGRESS NOTE  [name] is a 33 y.o. G3P2002 at 8113w1d  admitted for active labor  Subjective: Uncomfortable, requests epidural  Objective: BP 122/67 mmHg  Pulse 88  Temp(Src) 98.3 F (36.8 C) (Oral)  Resp 20  Ht 5\' 6"  (1.676 m)  Wt 218 lb (98.884 kg)  BMI 35.20 kg/m2  LMP 09/18/2013 or  Filed Vitals:   06/12/14 1540 06/12/14 1712 06/12/14 1727 06/12/14 2003  BP: 119/74 124/72 116/58 122/67  Pulse: 87 95 85 88  Temp:   97.7 F (36.5 C) 98.3 F (36.8 C)  TempSrc:   Oral Oral  Resp:  20 18 20   Height:      Weight:           FHT:  FHR: 145 bpm, variability: moderate,  accelerations:  Present,  decelerations:  Absent UC:   q4-418min SVE:   Dilation: 4 Effacement (%): 70 Station: -3 Exam by:: Mayford KnifeWilliams CNM  Dilation: 4 Effacement (%): 70 Cervical Position: Posterior Station: -3 Presentation: Vertex Exam by:: Williams CNM  Labs: Lab Results  Component Value Date   WBC 8.5 06/12/2014   HGB 9.6* 06/12/2014   HCT 29.0* 06/12/2014   MCV 85.0 06/12/2014   PLT 314 06/12/2014    Assessment / Plan: early latent labor  Labor: patient made good change from 1cm to 4cm through out day, will give epidural, start pitocin and rupture when able Fetal Wellbeing:  Category I Pain Control:  Epidural Anticipated MOD:  NSVD Cephalic presentation confirmed with ultrasound 2/2 difficult exam  Perry MountACOSTA,Kristen Fromm ROCIO, MD 06/12/2014, 10:03 PM

## 2014-06-13 ENCOUNTER — Encounter (HOSPITAL_COMMUNITY): Payer: Self-pay | Admitting: *Deleted

## 2014-06-13 DIAGNOSIS — Z3A38 38 weeks gestation of pregnancy: Secondary | ICD-10-CM

## 2014-06-13 LAB — TYPE AND SCREEN
ABO/RH(D): O POS
Antibody Screen: NEGATIVE

## 2014-06-13 LAB — ABO/RH: ABO/RH(D): O POS

## 2014-06-13 MED ORDER — TETANUS-DIPHTH-ACELL PERTUSSIS 5-2.5-18.5 LF-MCG/0.5 IM SUSP: 0.5000 mL | Freq: Once | INTRAMUSCULAR | Status: DC

## 2014-06-13 MED ORDER — LIDOCAINE-EPINEPHRINE (PF) 1.5 %-1:200000 IJ SOLN
INTRAMUSCULAR | Status: DC | PRN
  Administered 2014-06-12: 3 mL

## 2014-06-13 MED ORDER — SIMETHICONE 80 MG PO CHEW
80.0000 mg | CHEWABLE_TABLET | ORAL | Status: DC | PRN
Start: 2014-06-13 — End: 2014-06-15

## 2014-06-13 MED ORDER — BENZOCAINE-MENTHOL 20-0.5 % EX AERO
1.0000 | INHALATION_SPRAY | CUTANEOUS | Status: DC | PRN
Start: 2014-06-13 — End: 2014-06-15
  Filled 2014-06-13: qty 56

## 2014-06-13 MED ORDER — DIPHENHYDRAMINE HCL 25 MG PO CAPS: 25.0000 mg | ORAL_CAPSULE | Freq: Four times a day (QID) | ORAL | Status: DC | PRN

## 2014-06-13 MED ORDER — WITCH HAZEL-GLYCERIN EX PADS: 1.0000 "application " | MEDICATED_PAD | CUTANEOUS | Status: DC | PRN

## 2014-06-13 MED ORDER — BUPIVACAINE HCL (PF) 0.25 % IJ SOLN
INTRAMUSCULAR | Status: DC | PRN
  Administered 2014-06-12 (×2): 4 mL via EPIDURAL

## 2014-06-13 MED ORDER — DIBUCAINE 1 % RE OINT: 1.0000 "application " | TOPICAL_OINTMENT | RECTAL | Status: DC | PRN

## 2014-06-13 MED ORDER — SENNOSIDES-DOCUSATE SODIUM 8.6-50 MG PO TABS
2.0000 | ORAL_TABLET | ORAL | Status: DC
  Administered 2014-06-13 – 2014-06-14 (×2): 2 via ORAL
  Filled 2014-06-13 (×2): qty 2

## 2014-06-13 MED ORDER — ONDANSETRON HCL 4 MG/2ML IJ SOLN: 4.0000 mg | INTRAMUSCULAR | Status: DC | PRN

## 2014-06-13 MED ORDER — OXYCODONE-ACETAMINOPHEN 5-325 MG PO TABS
2.0000 | ORAL_TABLET | ORAL | Status: DC | PRN
  Administered 2014-06-13 – 2014-06-15 (×7): 2 via ORAL
  Filled 2014-06-13 (×5): qty 2

## 2014-06-13 MED ORDER — OXYCODONE-ACETAMINOPHEN 5-325 MG PO TABS
1.0000 | ORAL_TABLET | ORAL | Status: DC | PRN
  Filled 2014-06-13 (×2): qty 1

## 2014-06-13 MED ORDER — ZOLPIDEM TARTRATE 5 MG PO TABS: 5.0000 mg | ORAL_TABLET | Freq: Every evening | ORAL | Status: DC | PRN

## 2014-06-13 MED ORDER — LANOLIN HYDROUS EX OINT: TOPICAL_OINTMENT | CUTANEOUS | Status: DC | PRN

## 2014-06-13 MED ORDER — ONDANSETRON HCL 4 MG PO TABS: 4.0000 mg | ORAL_TABLET | ORAL | Status: DC | PRN

## 2014-06-13 MED ORDER — PRENATAL MULTIVITAMIN CH
1.0000 | ORAL_TABLET | Freq: Every day | ORAL | Status: DC
  Filled 2014-06-13: qty 1

## 2014-06-13 NOTE — Progress Notes (Signed)
Nani GasserFealethea D Deahl is a 33 y.o. G3P2002 at 4672w2d admitted for active labor  Subjective: Reports feeling pressure.  Objective: BP 118/55 mmHg  Pulse 76  Temp(Src) 98.3 F (36.8 C) (Oral)  Resp 18  Ht 5\' 6"  (1.676 m)  Wt 218 lb (98.884 kg)  BMI 35.20 kg/m2  SpO2 100%  LMP 09/18/2013    FHT:  FHR: 130 bpm, variability: moderate,  accelerations:  Present,  decelerations:  Absent UC:   regular, every 3-4 minutes  SVE:   Dilation: 7.5 Effacement (%): 100 Station: 0 Exam by:: Haston Casebolt s cnm  Pitocin @ 24 mu/min  Labs: Lab Results  Component Value Date   WBC 8.5 06/12/2014   HGB 9.6* 06/12/2014   HCT 29.0* 06/12/2014   MCV 85.0 06/12/2014   PLT 314 06/12/2014    Assessment / Plan: Augmentation of labor, progressing well  Labor: Progressing well on Pitocin Fetal Wellbeing:  Category I Pain Control:  Epidural Pre-eclampsia: n?a I/D:  n/a Anticipated MOD:  NSVD  Theora Vankirk SHWON Student NM 06/13/2014, 10:51 AM

## 2014-06-13 NOTE — Anesthesia Procedure Notes (Signed)
Epidural Patient location during procedure: OB  Staffing Anesthesiologist: Fredie Majano, CHRIS Performed by: anesthesiologist   Preanesthetic Checklist Completed: patient identified, surgical consent, pre-op evaluation, timeout performed, IV checked, risks and benefits discussed and monitors and equipment checked  Epidural Patient position: sitting Prep: site prepped and draped and DuraPrep Patient monitoring: heart rate, cardiac monitor, continuous pulse ox and blood pressure Approach: midline Location: L4-L5 Injection technique: LOR saline  Needle:  Needle type: Tuohy  Needle gauge: 17 G Needle length: 9 cm Needle insertion depth: 8 cm Catheter type: closed end flexible Catheter size: 19 Gauge Catheter at skin depth: 14 cm Test dose: 1.5% lidocaine with Epi 1:200 K and negative  Assessment Events: blood not aspirated, injection not painful, no injection resistance, negative IV test and no paresthesia  Additional Notes H+P and labs checked, risks and benefits discussed with the patient, consent obtained, procedure tolerated well and without complications.  Reason for block:procedure for pain

## 2014-06-13 NOTE — Progress Notes (Signed)
[name] is a 33 y.o. G3P2002 at 751w2d admitted for active labor  Subjective: Continues to be comfortable with epidural  Objective: BP 120/69 mmHg  Pulse 83  Temp(Src) 98.2 F (36.8 C) (Oral)  Resp 18  Ht 5\' 6"  (1.676 m)  Wt 98.884 kg (218 lb)  BMI 35.20 kg/m2  SpO2 100%  LMP 09/18/2013      FHT:  FHR: 135 bpm, variability: moderate,  accelerations:  Present,  decelerations:  Absent UC:   irregular SVE:   Dilation: 5 Effacement (%): 80 Station: -2 Exam by:: [name] Melman md  Labs: Lab Results  Component Value Date   WBC 8.5 06/12/2014   HGB 9.6* 06/12/2014   HCT 29.0* 06/12/2014   MCV 85.0 06/12/2014   PLT 314 06/12/2014    Assessment / Plan: Latent labor, with pitocin augmentation  Labor: Cervix remains 5/80/-2. AROM of forebag @0440 , clear fluid, small volume Preeclampsia:  n/a Fetal Wellbeing:  Category I Pain Control:  Epidural I/D:  GBS negative Anticipated MOD:  NSVD  [name], [name] 06/13/2014, 4:41 AM

## 2014-06-13 NOTE — Progress Notes (Signed)
Nani GasserFealethea D Tallie is a 33 y.o. G3P2002 at 330w2d admitted for active labor  Subjective: Comfortable with epidural  Objective: BP 107/59 mmHg  Pulse 87  Temp(Src) 98.4 F (36.9 C) (Oral)  Resp 20  Ht 5\' 6"  (1.676 m)  Wt 98.884 kg (218 lb)  BMI 35.20 kg/m2  SpO2 100%  LMP 09/18/2013      FHT:  FHR: 135 bpm, variability: moderate,  accelerations:  Present,  decelerations:  Absent UC:   irregular SVE:   Dilation: 5 Effacement (%): 80 Station: -2 Exam by:: Aycen Porreca MD  Labs: Lab Results  Component Value Date   WBC 8.5 06/12/2014   HGB 9.6* 06/12/2014   HCT 29.0* 06/12/2014   MCV 85.0 06/12/2014   PLT 314 06/12/2014    Assessment / Plan: Augmentation of labor, progressing well  Labor: Progressing on Pitocin, will continue to increase then AROM - planned to AROM on this check, but unable to feel bulging bag of water Preeclampsia:  n/a Fetal Wellbeing:  Category I Pain Control:  Epidural I/D:  GBS negative Anticipated MOD:  NSVD  [name], Ermal Haberer 06/13/2014, 1:47 AM

## 2014-06-13 NOTE — Anesthesia Preprocedure Evaluation (Signed)
Anesthesia Evaluation  Patient identified by MRN, date of birth, ID band Patient awake    Reviewed: Allergy & Precautions, H&P , NPO status , Patient's Chart, lab work & pertinent test results  History of Anesthesia Complications Negative for: history of anesthetic complications  Airway Mallampati: III  TM Distance: >3 FB Neck ROM: Full    Dental  (+) Teeth Intact   Pulmonary neg shortness of breath, asthma , neg sleep apnea, neg COPDneg recent URI,          Cardiovascular negative cardio ROS  Rhythm:Regular     Neuro/Psych  Headaches, negative psych ROS   GI/Hepatic Neg liver ROS, PUD, GERD-  ,  Endo/Other  Morbid obesity  Renal/GU negative Renal ROS     Musculoskeletal   Abdominal   Peds  Hematology  (+) anemia ,   Anesthesia Other Findings   Reproductive/Obstetrics (+) Pregnancy                             Anesthesia Physical Anesthesia Plan  ASA: II  Anesthesia Plan: Epidural   Post-op Pain Management:    Induction:   Airway Management Planned:   Additional Equipment:   Intra-op Plan:   Post-operative Plan:   Informed Consent: I have reviewed the patients History and Physical, chart, labs and discussed the procedure including the risks, benefits and alternatives for the proposed anesthesia with the patient or authorized representative who has indicated his/her understanding and acceptance.     Plan Discussed with: Anesthesiologist  Anesthesia Plan Comments:         Anesthesia Quick Evaluation

## 2014-06-14 NOTE — Progress Notes (Signed)
UR chart review completed.  

## 2014-06-14 NOTE — Addendum Note (Signed)
Addendum  created 06/14/14 0933 by Armanda Heritageharlesetta M Teresha Hanks, CRNA   Modules edited: Notes Section   Notes Section:  File: [id number]

## 2014-06-14 NOTE — Anesthesia Postprocedure Evaluation (Signed)
  Anesthesia Post-op Note  Patient: [name]  Procedure(s) Performed: * No procedures listed *  Patient Location: PACU and Mother/Baby  Anesthesia Type:Epidural  Level of Consciousness: awake, alert  and oriented  Airway and Oxygen Therapy: Patient Spontanous Breathing  Post-op Pain: mild  Post-op Assessment: Post-op Vital signs reviewed  Post-op Vital Signs: Reviewed and stable  Last Vitals:  Filed Vitals:   06/14/14 0607  BP: 125/70  Pulse: 75  Temp: 36.5 C  Resp: 19    Complications: No apparent anesthesia complications

## 2014-06-14 NOTE — Progress Notes (Signed)
Post Partum Day 1 Subjective: no complaints, up ad lib, voiding, tolerating PO and + flatus  Objective: Blood pressure 125/70, pulse 75, temperature 97.7 F (36.5 C), temperature source Oral, resp. rate 19, height 5\' 6"  (1.676 m), weight 218 lb (98.884 kg), last menstrual period 09/18/2013, SpO2 99 %, unknown if currently breastfeeding.  Physical Exam:  General: alert, cooperative, appears stated age and no distress Lochia: appropriate Uterine Fundus: firm Incision: n/a DVT Evaluation: No evidence of DVT seen on physical exam. Negative Homan's sign. No cords or calf tenderness. No significant calf/ankle edema.   Recent Labs  06/12/14 1400  HGB 9.6*  HCT 29.0*    Assessment/Plan: Plan for discharge tomorrow   LOS: 2 days   Wyvonnia DuskyLAWSON, MARIE DARLENE 06/14/2014, 6:36 AM

## 2014-06-15 MED ORDER — OXYCODONE-ACETAMINOPHEN 5-325 MG PO TABS: 1.0000 | ORAL_TABLET | Freq: Four times a day (QID) | ORAL | Status: DC | PRN

## 2014-06-15 NOTE — Discharge Summary (Signed)
Obstetric Discharge Summary Reason for Admission: onset of labor Prenatal Procedures: none Intrapartum Procedures: spontaneous vaginal delivery Postpartum Procedures: none Complications-Operative and Postpartum: none HEMOGLOBIN  Date Value Ref Range Status  06/12/2014 9.6* 12.0 - 15.0 g/dL Final   HCT  Date Value Ref Range Status  06/12/2014 29.0* 36.0 - 46.0 % Final   Hospital Course: [name] is a 33 y.o. G3P3003 status post NSVD at 4371w2d. She was initially admitted on 06/12/14 in spontaneous labor. She required AROM for augmentation. She delivered a viable female at [id number] on 06/13/14. Her postpartum course has been uncomplicated.   Physical Exam:  General: alert, cooperative, appears stated age and no distress Lochia: appropriate Uterine Fundus: firm Incision: N/A DVT Evaluation: No evidence of DVT seen on physical exam. Negative Homan's sign.  Discharge Diagnoses: Term Pregnancy-delivered  Discharge Information: Date: 06/15/2014 Activity: pelvic rest Diet: routine Medications: Percocet, pt having pain resistant to Tylenol, and pt is allergic to Ibuprofen.  Condition: stable Instructions: refer to practice specific booklet Discharge to: home   Newborn Data: Live born female  Birth Weight: 8 lb 9.4 oz (3895 g) APGAR: 8, 9  Home with mother.  Benjamin Stainhompson, McKenzie L, MD 06/15/2014, 7:23 AM   OB fellow attestation Post Partum Day 2 I have seen and examined this patient and agree with above documentation in the resident's note.   [name] is a 33 y.o. [id number] s/p NSVD. Hospital Course added to above note.  Pt denies problems with ambulating, voiding or po intake. Pain is well controlled.  Plan for birth control is Depo-Provera.  Method of Feeding: bottle  PE:  BP 138/81 mmHg  Pulse 87  Temp(Src) 98.5 F (36.9 C) (Oral)  Resp 18  Ht 5\' 6"  (1.676 m)  Wt 218 lb (98.884 kg)  BMI 35.20 kg/m2  SpO2 99%  LMP 09/18/2013  Breastfeeding? Unknown Fundus  firm  Plan for discharge: Today  William DaltonMcEachern, Thorsten Climer, MD 9:00 AM

## 2014-06-16 ENCOUNTER — Ambulatory Visit (INDEPENDENT_AMBULATORY_CARE_PROVIDER_SITE_OTHER): Payer: Medicaid Other

## 2014-06-16 VITALS — BP 134/72 | HR 76 | Wt 217.0 lb

## 2014-06-16 DIAGNOSIS — Z3042 Encounter for surveillance of injectable contraceptive: Secondary | ICD-10-CM

## 2014-06-16 MED ORDER — MEDROXYPROGESTERONE ACETATE 104 MG/0.65ML ~~LOC~~ SUSP
104.0000 mg | Freq: Once | SUBCUTANEOUS | Status: AC
  Administered 2014-06-16: 104 mg via SUBCUTANEOUS

## 2014-07-22 ENCOUNTER — Telehealth: Payer: Self-pay | Admitting: *Deleted

## 2014-07-22 ENCOUNTER — Telehealth: Payer: Self-pay | Admitting: Family Medicine

## 2014-07-22 NOTE — Telephone Encounter (Signed)
[name] called and left a message that she was called about needing to reschedule her appointment tomorrow because of inclement weather. States she spoke with her job and they need us to fax a paper showing date she can return and reason why. Ask for a call back.  Called her back and we discussed FMLA papers had been sent in back in November with EDD. She delivered in December. States " all I know is that corporate states they need the papers refaxed and stating when I can come back to work and why. Per chart review has signed release and papers are scanned. I informed her may not be done today, but will be done as soon as possible when we open - we are closed tomorrow and the weekend. She voiced understanding.

## 2014-07-22 NOTE — Telephone Encounter (Signed)
Called patient confirmed identity, informed her that we needed to reschedule tomorrow appointment 07/23/2014 due to inclement weather. Patient said she understood and than stated that she needed additional paper work for being out of work and transferred to Personnel officernurse line.

## 2014-07-22 NOTE — Telephone Encounter (Signed)
Called patient and informed her of rescheduled appointment.

## 2014-07-23 ENCOUNTER — Ambulatory Visit: Payer: Medicaid Other | Admitting: Family Medicine

## 2014-07-26 ENCOUNTER — Encounter: Payer: Self-pay | Admitting: *Deleted

## 2014-07-26 NOTE — Telephone Encounter (Signed)
Pt contacted clinic requesting call from nurse.  Contacted patient, pt request for letter to be sent to employer to be released to delivery on 07/27/14 and faxed [phone].

## 2014-07-26 NOTE — Telephone Encounter (Signed)
Short-term disability papers completed and faxed.  

## 2014-07-29 ENCOUNTER — Encounter: Payer: Self-pay | Admitting: Family

## 2014-07-29 ENCOUNTER — Ambulatory Visit (INDEPENDENT_AMBULATORY_CARE_PROVIDER_SITE_OTHER): Payer: Medicaid Other | Admitting: Family

## 2014-07-29 MED ORDER — CYCLOBENZAPRINE HCL 10 MG PO TABS: 10.0000 mg | ORAL_TABLET | Freq: Three times a day (TID) | ORAL | Status: DC | PRN

## 2014-07-29 NOTE — Progress Notes (Signed)
  Subjective:     [name] is a 34 y.o. female who presents for a postpartum visit. She is 6 weeks postpartum following a spontaneous vaginal delivery. I have fully reviewed the prenatal and intrapartum course. The delivery was at 38 gestational weeks. Outcome: spontaneous vaginal delivery. Anesthesia: epidural. Postpartum course has been unremarkable, other than headaches.  Headaches occur, but last occasionally 2-3 days.  +photophobia.  Denies nausea and vomiting.  Frontal in nature.  Occurs towards the end of the day.  . Baby's course has been unremarkable. Baby is feeding by bottle - Similac Advance. Bleeding no bleeding. Bowel function is normal. Bladder function is normal. Patient is not sexually active. Contraception method is Depo-Provera injections. Postpartum depression screening: negative.  The following portions of the patient's history were reviewed and updated as appropriate: allergies, current medications, past family history, past medical history, past social history, past surgical history and problem list.  Review of Systems Pertinent items are noted in HPI.   Objective:   General:  alert, cooperative and appears stated age   Breasts:  inspection negative, no nipple discharge or bleeding, no masses or nodularity palpable  Lungs: clear to auscultation bilaterally  Heart:  regular rate and rhythm, S1, S2 normal, no murmur, click, rub or gallop  Abdomen: soft, non-tender; bowel sounds normal; no masses,  no organomegaly  Pelvic exam not indicated - not bleeding, no pain at vaginal area       Assessment:     Normal postpartum exam. Pap smear not done at today's visit.   Last pap 2014, negative, neg HR HPV Headaches  Plan:    1. Contraception: Depo-Provera injections 2. Rx Flexeril 10 mg prn for headaches 3. Follow up as needed.    Eino FarberWalidah Kennith GainN Karim, CNM

## 2014-08-08 ENCOUNTER — Encounter (HOSPITAL_COMMUNITY): Payer: Self-pay | Admitting: Cardiology

## 2014-08-08 ENCOUNTER — Emergency Department (HOSPITAL_COMMUNITY)
Admission: EM | Admit: 2014-08-08 | Discharge: 2014-08-08 | Disposition: A | Discharge status: Home / Self Care | Payer: Medicaid Other | Attending: Emergency Medicine | Admitting: Emergency Medicine

## 2014-08-08 DIAGNOSIS — Z79899 Other long term (current) drug therapy: Secondary | ICD-10-CM | POA: Insufficient documentation

## 2014-08-08 DIAGNOSIS — Z862 Personal history of diseases of the blood and blood-forming organs and certain disorders involving the immune mechanism: Secondary | ICD-10-CM | POA: Insufficient documentation

## 2014-08-08 DIAGNOSIS — Z87440 Personal history of urinary (tract) infections: Secondary | ICD-10-CM | POA: Insufficient documentation

## 2014-08-08 DIAGNOSIS — J45909 Unspecified asthma, uncomplicated: Secondary | ICD-10-CM | POA: Insufficient documentation

## 2014-08-08 DIAGNOSIS — Z8719 Personal history of other diseases of the digestive system: Secondary | ICD-10-CM | POA: Insufficient documentation

## 2014-08-08 DIAGNOSIS — R109 Unspecified abdominal pain: Secondary | ICD-10-CM

## 2014-08-08 DIAGNOSIS — R1030 Lower abdominal pain, unspecified: Secondary | ICD-10-CM | POA: Diagnosis present

## 2014-08-08 DIAGNOSIS — Z3202 Encounter for pregnancy test, result negative: Secondary | ICD-10-CM | POA: Insufficient documentation

## 2014-08-08 LAB — CBC WITH DIFFERENTIAL/PLATELET
BASOS PCT: 1 % (ref 0–1)
Basophils Absolute: 0 10*3/uL (ref 0.0–0.1)
EOS ABS: 0.5 10*3/uL (ref 0.0–0.7)
EOS PCT: 8 % — AB (ref 0–5)
HCT: 35.2 % — ABNORMAL LOW (ref 36.0–46.0)
Hemoglobin: 11.9 g/dL — ABNORMAL LOW (ref 12.0–15.0)
LYMPHS ABS: 1.9 10*3/uL (ref 0.7–4.0)
Lymphocytes Relative: 30 % (ref 12–46)
MCH: 28.7 pg (ref 26.0–34.0)
MCHC: 33.8 g/dL (ref 30.0–36.0)
MCV: 85 fL (ref 78.0–100.0)
Monocytes Absolute: 0.5 10*3/uL (ref 0.1–1.0)
Monocytes Relative: 8 % (ref 3–12)
NEUTROS PCT: 53 % (ref 43–77)
Neutro Abs: 3.4 10*3/uL (ref 1.7–7.7)
Platelets: 337 10*3/uL (ref 150–400)
RBC: 4.14 MIL/uL (ref 3.87–5.11)
RDW: 18.7 % — ABNORMAL HIGH (ref 11.5–15.5)
WBC: 6.4 10*3/uL (ref 4.0–10.5)

## 2014-08-08 LAB — URINALYSIS, ROUTINE W REFLEX MICROSCOPIC
Bilirubin Urine: NEGATIVE
Glucose, UA: NEGATIVE mg/dL
Hgb urine dipstick: NEGATIVE
Ketones, ur: NEGATIVE mg/dL
Nitrite: NEGATIVE
Protein, ur: NEGATIVE mg/dL
Specific Gravity, Urine: 1.018 (ref 1.005–1.030)
Urobilinogen, UA: 0.2 mg/dL (ref 0.0–1.0)
pH: 6 (ref 5.0–8.0)

## 2014-08-08 LAB — BASIC METABOLIC PANEL
ANION GAP: 4 — AB (ref 5–15)
BUN: 8 mg/dL (ref 6–23)
CALCIUM: 8.6 mg/dL (ref 8.4–10.5)
CO2: 25 mmol/L (ref 19–32)
Chloride: 110 mmol/L (ref 96–112)
Creatinine, Ser: 0.77 mg/dL (ref 0.50–1.10)
GFR calc Af Amer: >90 mL/min (ref >90)
GFR calc non Af Amer: >90 mL/min (ref >90)
GLUCOSE: 100 mg/dL — AB (ref 70–99)
Potassium: 3.8 mmol/L (ref 3.5–5.1)
Sodium: 139 mmol/L (ref 135–145)

## 2014-08-08 LAB — URINE MICROSCOPIC-ADD ON

## 2014-08-08 LAB — POC URINE PREG, ED: Preg Test, Ur: NEGATIVE

## 2014-08-08 MED ORDER — OXYCODONE-ACETAMINOPHEN 5-325 MG PO TABS
1.0000 | ORAL_TABLET | Freq: Once | ORAL | Status: AC
  Administered 2014-08-08: 1 via ORAL
  Filled 2014-08-08: qty 1

## 2014-08-08 MED ORDER — OXYCODONE-ACETAMINOPHEN 5-325 MG PO TABS: 1.0000 | ORAL_TABLET | ORAL | Status: DC | PRN

## 2014-08-08 NOTE — ED Provider Notes (Signed)
CSN: [id number]     Arrival date & time 08/08/14  0801 History   First MD Initiated Contact with Patient 08/08/14 [phone]     Chief Complaint  Patient presents with  . Flank Pain     (Consider location/radiation/quality/duration/timing/severity/associated sxs/prior Treatment) The history is provided by the patient and medical records.   [name] is a 34 year old female with a past medical history of asthma, migraines, IBS, and GERD presents with a 3 day history of intermittent right flank pain. Pain is 10/10, described as aching/stabbing, located in the right flank with radiation to the back, and not alleviated or worsened by anything of note. No OTC analgesics attempted. Patient states urine is malodorous and darker in color but denies frequency and dysuria. No hematuria.  Reports history of recurrent UTI but denies history of nephrolithiasis. Patient does admit she has only been drinking soda's and tea recently as she was not able to drink these while pregnancy.  Denies fever, abdominal pain, nausea, vomiting, diarrhea, vaginal discharge, vaginal bleeding, headache, congestion, cough, chest pain, and shortness of breath. Patient is 8 weeks post-partum not currently breast feeding. She had a post-partum visit on 07/29/14 with her OB-GYN, everything was normal at that time.  Vital signs stable on arrival.   Past Medical History  Diagnosis Date  . GERD (gastroesophageal reflux disease)   . Erosive esophagitis     distal esophagus, with low grade bleed 03/2009  . IBS (irritable bowel syndrome)   . Anemia   . History of migraine headaches   . Asthma   . Headache(784.0)   . Infection     UTI   Past Surgical History  Procedure Laterality Date  . No past surgeries     Family History  Problem Relation Age of Onset  . Migraines Mother   . Asthma Mother   . Migraines Father   . Heart disease Father   . Asthma Daughter   . Cancer Maternal Grandmother    History  Substance Use Topics   . Smoking status: Never Smoker   . Smokeless tobacco: Never Used  . Alcohol Use: No     Comment: occasional on holidays   OB History    Gravida Para Term Preterm AB TAB SAB Ectopic Multiple Living   3 3 3       0 3     Review of Systems  Constitutional: Negative for fever.  Gastrointestinal: Negative for nausea, vomiting and diarrhea.  Genitourinary: Positive for flank pain. Negative for dysuria, frequency, vaginal discharge, difficulty urinating, vaginal pain and pelvic pain.  All other systems reviewed and are negative.     Allergies  Ibuprofen  Home Medications   Prior to Admission medications   Medication Sig Start Date End Date Taking? Authorizing Provider  albuterol (PROVENTIL HFA;VENTOLIN HFA) 108 (90 BASE) MCG/ACT inhaler Inhale 2 puffs into the lungs every 6 (six) hours as needed for wheezing or shortness of breath. 06/08/14   Deirdre Colin Mulders Poe, CNM  cyclobenzaprine (FLEXERIL) 10 MG tablet Take 1 tablet (10 mg total) by mouth 3 (three) times daily as needed (headaches). 07/29/14   Marlis EdelsonWalidah N Karim, CNM  medroxyPROGESTERone (DEPO-PROVERA) 150 MG/ML injection Inject 150 mg into the muscle every 3 (three) months.    Historical Provider, MD  oxyCODONE-acetaminophen (PERCOCET/ROXICET) 5-325 MG per tablet Take 1 tablet by mouth every 6 (six) hours as needed for severe pain (For severe pain). Patient not taking: Reported on 07/29/2014 06/15/14   Benjamin StainMcKenzie L Thompson, MD  Prenatal Vit-Fe Fumarate-FA (PRENATAL MULTIVITAMIN) TABS tablet Take 1 tablet by mouth daily at 12 noon.    Historical Provider, MD   BP 151/92 mmHg  Pulse 67  Temp(Src) 98.3 F (36.8 C) (Oral)  Wt 206 lb (93.441 kg)  SpO2 99%   Physical Exam  Constitutional: She is oriented to person, place, and time. She appears well-developed and well-nourished.  HENT:  Head: Normocephalic and atraumatic.  Right Ear: Tympanic membrane normal.  Left Ear: Tympanic membrane normal.  Mouth/Throat: Oropharynx is clear and moist.  No posterior oropharyngeal edema or posterior oropharyngeal erythema.  Eyes: Conjunctivae and EOM are normal. Pupils are equal, round, and reactive to light.  Neck: Normal range of motion.  Cardiovascular: Normal rate, regular rhythm and normal heart sounds.   Pulmonary/Chest: Effort normal and breath sounds normal. No respiratory distress.  Abdominal: Soft. Bowel sounds are normal. There is no tenderness. There is CVA tenderness (right). There is no guarding.  Abdomen soft, non-distended, no focal tenderness or peritoneal signs R CVA tenderness noted  Musculoskeletal: Normal range of motion.  Neurological: She is alert and oriented to person, place, and time.  Skin: Skin is warm and dry.  Psychiatric: She has a normal mood and affect.  Nursing note and vitals reviewed.   ED Course  Procedures (including critical care time) Labs Review Labs Reviewed  CBC WITH DIFFERENTIAL/PLATELET - Abnormal; Notable for the following:    Hemoglobin 11.9 (*)    HCT 35.2 (*)    RDW 18.7 (*)    Eosinophils Relative 8 (*)    All other components within normal limits  BASIC METABOLIC PANEL - Abnormal; Notable for the following:    Glucose, Bld 100 (*)    Anion gap 4 (*)    All other components within normal limits  URINALYSIS, ROUTINE W REFLEX MICROSCOPIC - Abnormal; Notable for the following:    Leukocytes, UA SMALL (*)    All other components within normal limits  URINE MICROSCOPIC-ADD ON - Abnormal; Notable for the following:    Squamous Epithelial / LPF FEW (*)    All other components within normal limits  POC URINE PREG, ED    Imaging Review No results found.   EKG Interpretation None      MDM   Final diagnoses:  Flank pain   34 year old female presenting with right flank pain for the past 4 days. Pain has been intermittent. She is 8 weeks postpartum, recent postpartum visit with her OB/GYN last week with negative evaluation at that time. She denies any abdominal pain, vaginal  bleeding, vaginal discharge, or pelvic pain. On exam, patient afebrile and nontoxic in appearance. She does have noted right CVA tenderness, remainder of abdominal exam is benign. Lab work as above. Urine pregnancy negative. UA with small leuks, no infection to suggest pyelo, no blood to suggest kidney stone.  Patient given dose of percocet in the ED with resolution of pain.  She is tolerating PO well.  Suspect MSK pain vs renal colic given amount of soda/tea patient has been drinking.  Short supply percocet for home.  FU with PCP.  Discussed plan with patient, he/she acknowledged understanding and agreed with plan of care.  Return precautions given for new or worsening symptoms.  Garlon Hatchet, PA-C 08/08/14 1039  Purvis Sheffield, MD 08/09/14 1226

## 2014-08-08 NOTE — ED Notes (Signed)
Pt reports right sided flank pain since Thursday with dark/odor urine.

## 2014-08-08 NOTE — Discharge Instructions (Signed)
Take the prescribed medication as directed.  Make sure to drink plenty of water to keep yourself hydrated. Follow-up with your primary care physician. Return to the ED for new or worsening symptoms.

## 2014-08-16 ENCOUNTER — Emergency Department (HOSPITAL_COMMUNITY)
Admission: EM | Admit: 2014-08-16 | Discharge: 2014-08-16 | Disposition: A | Discharge status: Home / Self Care | Payer: Medicaid Other | Attending: Emergency Medicine | Admitting: Emergency Medicine

## 2014-08-16 ENCOUNTER — Encounter (HOSPITAL_COMMUNITY): Payer: Self-pay | Admitting: *Deleted

## 2014-08-16 DIAGNOSIS — O9953 Diseases of the respiratory system complicating the puerperium: Secondary | ICD-10-CM | POA: Insufficient documentation

## 2014-08-16 DIAGNOSIS — Z3202 Encounter for pregnancy test, result negative: Secondary | ICD-10-CM | POA: Diagnosis not present

## 2014-08-16 DIAGNOSIS — M546 Pain in thoracic spine: Secondary | ICD-10-CM | POA: Diagnosis not present

## 2014-08-16 DIAGNOSIS — Z87440 Personal history of urinary (tract) infections: Secondary | ICD-10-CM | POA: Insufficient documentation

## 2014-08-16 DIAGNOSIS — Z8719 Personal history of other diseases of the digestive system: Secondary | ICD-10-CM | POA: Diagnosis not present

## 2014-08-16 DIAGNOSIS — G43909 Migraine, unspecified, not intractable, without status migrainosus: Secondary | ICD-10-CM | POA: Insufficient documentation

## 2014-08-16 DIAGNOSIS — R10A1 Flank pain, right side: Secondary | ICD-10-CM

## 2014-08-16 DIAGNOSIS — J45909 Unspecified asthma, uncomplicated: Secondary | ICD-10-CM | POA: Insufficient documentation

## 2014-08-16 DIAGNOSIS — O8620 Urinary tract infection following delivery, unspecified: Secondary | ICD-10-CM | POA: Diagnosis not present

## 2014-08-16 DIAGNOSIS — M62830 Muscle spasm of back: Secondary | ICD-10-CM | POA: Diagnosis not present

## 2014-08-16 DIAGNOSIS — O9989 Other specified diseases and conditions complicating pregnancy, childbirth and the puerperium: Secondary | ICD-10-CM | POA: Diagnosis present

## 2014-08-16 DIAGNOSIS — Z79899 Other long term (current) drug therapy: Secondary | ICD-10-CM | POA: Diagnosis not present

## 2014-08-16 DIAGNOSIS — O9943 Diseases of the circulatory system complicating the puerperium: Secondary | ICD-10-CM | POA: Insufficient documentation

## 2014-08-16 DIAGNOSIS — G8929 Other chronic pain: Secondary | ICD-10-CM | POA: Insufficient documentation

## 2014-08-16 DIAGNOSIS — B9689 Other specified bacterial agents as the cause of diseases classified elsewhere: Secondary | ICD-10-CM | POA: Insufficient documentation

## 2014-08-16 DIAGNOSIS — N390 Urinary tract infection, site not specified: Secondary | ICD-10-CM

## 2014-08-16 DIAGNOSIS — R109 Unspecified abdominal pain: Secondary | ICD-10-CM

## 2014-08-16 DIAGNOSIS — Z862 Personal history of diseases of the blood and blood-forming organs and certain disorders involving the immune mechanism: Secondary | ICD-10-CM | POA: Diagnosis not present

## 2014-08-16 LAB — CBC WITH DIFFERENTIAL/PLATELET
BASOS PCT: 0 % (ref 0–1)
Basophils Absolute: 0 10*3/uL (ref 0.0–0.1)
EOS PCT: 2 % (ref 0–5)
Eosinophils Absolute: 0.2 10*3/uL (ref 0.0–0.7)
HCT: 36.1 % (ref 36.0–46.0)
Hemoglobin: 12.1 g/dL (ref 12.0–15.0)
LYMPHS PCT: 19 % (ref 12–46)
Lymphs Abs: 1.6 10*3/uL (ref 0.7–4.0)
MCH: 29 pg (ref 26.0–34.0)
MCHC: 33.5 g/dL (ref 30.0–36.0)
MCV: 86.6 fL (ref 78.0–100.0)
MONOS PCT: 7 % (ref 3–12)
Monocytes Absolute: 0.6 10*3/uL (ref 0.1–1.0)
NEUTROS PCT: 72 % (ref 43–77)
Neutro Abs: 6.2 10*3/uL (ref 1.7–7.7)
Platelets: 346 10*3/uL (ref 150–400)
RBC: 4.17 MIL/uL (ref 3.87–5.11)
RDW: 18.3 % — ABNORMAL HIGH (ref 11.5–15.5)
WBC: 8.6 10*3/uL (ref 4.0–10.5)

## 2014-08-16 LAB — COMPREHENSIVE METABOLIC PANEL
ALBUMIN: 3.4 g/dL — AB (ref 3.5–5.2)
ALT: 18 U/L (ref 0–35)
AST: 18 U/L (ref 0–37)
Alkaline Phosphatase: 74 U/L (ref 39–117)
Anion gap: 3 — ABNORMAL LOW (ref 5–15)
BILIRUBIN TOTAL: 0.4 mg/dL (ref 0.3–1.2)
BUN: 8 mg/dL (ref 6–23)
CO2: 27 mmol/L (ref 19–32)
Calcium: 8.8 mg/dL (ref 8.4–10.5)
Chloride: 109 mmol/L (ref 96–112)
Creatinine, Ser: 0.92 mg/dL (ref 0.50–1.10)
GFR calc non Af Amer: 80 mL/min — ABNORMAL LOW (ref >90)
Glucose, Bld: 93 mg/dL (ref 70–99)
POTASSIUM: 3.8 mmol/L (ref 3.5–5.1)
Sodium: 139 mmol/L (ref 135–145)
TOTAL PROTEIN: 6.9 g/dL (ref 6.0–8.3)

## 2014-08-16 LAB — URINALYSIS, ROUTINE W REFLEX MICROSCOPIC
Bilirubin Urine: NEGATIVE
GLUCOSE, UA: NEGATIVE mg/dL
Ketones, ur: NEGATIVE mg/dL
Nitrite: POSITIVE — AB
Protein, ur: 30 mg/dL — AB
Specific Gravity, Urine: 1.016 (ref 1.005–1.030)
Urobilinogen, UA: 0.2 mg/dL (ref 0.0–1.0)
pH: 6 (ref 5.0–8.0)

## 2014-08-16 LAB — URINE MICROSCOPIC-ADD ON

## 2014-08-16 LAB — LIPASE, BLOOD: LIPASE: 22 U/L (ref 11–59)

## 2014-08-16 LAB — POC URINE PREG, ED: Preg Test, Ur: NEGATIVE

## 2014-08-16 MED ORDER — MORPHINE SULFATE 4 MG/ML IJ SOLN
4.0000 mg | Freq: Once | INTRAMUSCULAR | Status: AC
  Administered 2014-08-16: 4 mg via INTRAVENOUS
  Filled 2014-08-16: qty 1

## 2014-08-16 MED ORDER — CYCLOBENZAPRINE HCL 10 MG PO TABS: 10.0000 mg | ORAL_TABLET | Freq: Three times a day (TID) | ORAL | Status: DC | PRN

## 2014-08-16 MED ORDER — SULFAMETHOXAZOLE-TRIMETHOPRIM 800-160 MG PO TABS
1.0000 | ORAL_TABLET | Freq: Two times a day (BID) | ORAL | Status: DC
Start: 2014-08-16 — End: 2014-09-18

## 2014-08-16 MED ORDER — DEXTROSE 5 % IV SOLN
1.0000 g | Freq: Once | INTRAVENOUS | Status: AC
  Administered 2014-08-16: 1 g via INTRAVENOUS
  Filled 2014-08-16: qty 10

## 2014-08-16 MED ORDER — CYCLOBENZAPRINE HCL 10 MG PO TABS
5.0000 mg | ORAL_TABLET | Freq: Once | ORAL | Status: AC
  Administered 2014-08-16: 5 mg via ORAL
  Filled 2014-08-16: qty 1

## 2014-08-16 MED ORDER — SODIUM CHLORIDE 0.9 % IV BOLUS (SEPSIS)
1000.0000 mL | Freq: Once | INTRAVENOUS | Status: AC
  Administered 2014-08-16: 1000 mL via INTRAVENOUS

## 2014-08-16 MED ORDER — OXYCODONE-ACETAMINOPHEN 5-325 MG PO TABS: 1.0000 | ORAL_TABLET | Freq: Four times a day (QID) | ORAL | Status: DC | PRN

## 2014-08-16 NOTE — ED Notes (Signed)
Pt is stable upon d/c and verbalizes understanding rt d/c instructions and medications. Pt is escorted by this RN from ED via wheelchair.

## 2014-08-16 NOTE — ED Notes (Signed)
Pt states right sided flank pain began on 06/13/14 postpartum. Pt describes a sharp/achy feeling with a pain of 10/10. Pt denies n/v/d or problems urinating or having bowel movements.

## 2014-08-16 NOTE — Discharge Instructions (Signed)
Stay very well hydrated with plenty of water throughout the day. Take antibiotic until completed. Use flexeril and percocet as needed for pain and muscle spasms, but don't drive while taking these. Follow up with primary care physician in 1 week for recheck of ongoing symptoms but return to ER for emergent changing or worsening of symptoms. Please seek immediate care if you develop the following: You develop back pain.  Your symptoms are no better, or worse in 3 days. There is severe back pain or lower abdominal pain.  You develop chills.  You have a fever.  There is nausea or vomiting.  There is continued burning or discomfort with urination.     Back Pain, Adult Low back pain is very common. About 1 in 5 people have back pain.The cause of low back pain is rarely dangerous. The pain often gets better over time.About half of people with a sudden onset of back pain feel better in just 2 weeks. About 8 in 10 people feel better by 6 weeks.  CAUSES Some common causes of back pain include:  Strain of the muscles or ligaments supporting the spine.  Wear and tear (degeneration) of the spinal discs.  Arthritis.  Direct injury to the back. DIAGNOSIS Most of the time, the direct cause of low back pain is not known.However, back pain can be treated effectively even when the exact cause of the pain is unknown.Answering your caregiver's questions about your overall health and symptoms is one of the most accurate ways to make sure the cause of your pain is not dangerous. If your caregiver needs more information, he or she may order lab work or imaging tests (X-rays or MRIs).However, even if imaging tests show changes in your back, this usually does not require surgery. HOME CARE INSTRUCTIONS For many people, back pain returns.Since low back pain is rarely dangerous, it is often a condition that people can learn to [facility] their own.   Remain active. It is stressful on the back to sit or stand  in one place. Do not sit, drive, or stand in one place for more than 30 minutes at a time. Take short walks on level surfaces as soon as pain allows.Try to increase the length of time you walk each day.  Do not stay in bed.Resting more than 1 or 2 days can delay your recovery.  Do not avoid exercise or work.Your body is made to move.It is not dangerous to be active, even though your back may hurt.Your back will likely heal faster if you return to being active before your pain is gone.  Pay attention to your body when you bend and lift. Many people have less discomfortwhen lifting if they bend their knees, keep the load close to their bodies,and avoid twisting. Often, the most comfortable positions are those that put less stress on your recovering back.  Find a comfortable position to sleep. Use a firm mattress and lie on your side with your knees slightly bent. If you lie on your back, put a pillow under your knees.  Only take over-the-counter or prescription medicines as directed by your caregiver. Over-the-counter medicines to reduce pain and inflammation are often the most helpful.Your caregiver may prescribe muscle relaxant drugs.These medicines help dull your pain so you can more quickly return to your normal activities and healthy exercise.  Put ice on the injured area.  Put ice in a plastic bag.  Place a towel between your skin and the bag.  Leave the ice on  for 15-20 minutes, 03-04 times a day for the first 2 to 3 days. After that, ice and heat may be alternated to reduce pain and spasms.  Ask your caregiver about trying back exercises and gentle massage. This may be of some benefit.  Avoid feeling anxious or stressed.Stress increases muscle tension and can worsen back pain.It is important to recognize when you are anxious or stressed and learn ways to manage it.Exercise is a great option. SEEK MEDICAL CARE IF:  You have pain that is not relieved with rest or  medicine.  You have pain that does not improve in 1 week.  You have new symptoms.  You are generally not feeling well. SEEK IMMEDIATE MEDICAL CARE IF:   You have pain that radiates from your back into your legs.  You develop new bowel or bladder control problems.  You have unusual weakness or numbness in your arms or legs.  You develop nausea or vomiting.  You develop abdominal pain.  You feel faint. Document Released: 06/18/2005 Document Revised: 12/18/2011 Document Reviewed: 10/20/2013 [facility] Patient Information 2015 [place], [place]. This information is not intended to replace advice given to you by your health care provider. Make sure you discuss any questions you have with your health care provider.  Flank Pain Flank pain refers to pain that is located on the side of the body between the upper abdomen and the back. The pain may occur over a short period of time (acute) or may be long-term or reoccurring (chronic). It may be mild or severe. Flank pain can be caused by many things. CAUSES  Some of the more common causes of flank pain include:  Muscle strains.   Muscle spasms.   A disease of your spine (vertebral disk disease).   A lung infection (pneumonia).   Fluid around your lungs (pulmonary edema).   A kidney infection.   Kidney stones.   A very painful skin rash caused by the chickenpox virus (shingles).   Gallbladder disease.  HOME CARE INSTRUCTIONS  Home care will depend on the cause of your pain. In general,  Rest as directed by your caregiver.  Drink enough fluids to keep your urine clear or pale yellow.  Only take over-the-counter or prescription medicines as directed by your caregiver. Some medicines may help relieve the pain.  Tell your caregiver about any changes in your pain.  Follow up with your caregiver as directed. SEEK IMMEDIATE MEDICAL CARE IF:   Your pain is not controlled with medicine.   You have new or worsening  symptoms.  Your pain increases.   You have abdominal pain.   You have shortness of breath.   You have persistent nausea or vomiting.   You have swelling in your abdomen.   You feel faint or pass out.   You have blood in your urine.  You have a fever or persistent symptoms for more than 2-3 days.  You have a fever and your symptoms suddenly get worse. MAKE SURE YOU:   Understand these instructions.  Will watch your condition.  Will get help right away if you are not doing well or get worse. Document Released: 08/09/2005 Document Revised: 03/12/2012 Document Reviewed: 01/31/2012 [facility] Patient Information 2015 [place], [place]. This information is not intended to replace advice given to you by your health care provider. Make sure you discuss any questions you have with your health care provider.  Muscle Cramps and Spasms Muscle cramps and spasms are when muscles tighten by themselves. They usually get  better within minutes. Muscle cramps are painful. They are usually stronger and last longer than muscle spasms. Muscle spasms may or may not be painful. They can last a few seconds or much longer. HOME CARE  Drink enough fluid to keep your pee (urine) clear or pale yellow.  Massage, stretch, and relax the muscle.  Use a warm towel, heating pad, or warm shower water on tight muscles.  Place ice on the muscle if it is tender or in pain.  Put ice in a plastic bag.  Place a towel between your skin and the bag.  Leave the ice on for 15-20 minutes, 03-04 times a day.  Only take medicine as told by your doctor. GET HELP RIGHT AWAY IF:  Your cramps or spasms get worse, happen more often, or do not get better with time. MAKE SURE YOU:  Understand these instructions.  Will watch your condition.  Will get help right away if you are not doing well or get worse. Document Released: 05/31/2008 Document Revised: 10/13/2012 Document Reviewed: 06/04/2012 [facility] Patient  Information 2015 [place], [place]. This information is not intended to replace advice given to you by your health care provider. Make sure you discuss any questions you have with your health care provider.  Urinary Tract Infection A urinary tract infection (UTI) can occur any place along the urinary tract. The tract includes the kidneys, ureters, bladder, and urethra. A type of germ called bacteria often causes a UTI. UTIs are often helped with antibiotic medicine.  HOME CARE   If given, take antibiotics as told by your doctor. Finish them even if you start to feel better.  Drink enough fluids to keep your pee (urine) clear or pale yellow.  Avoid tea, drinks with caffeine, and bubbly (carbonated) drinks.  Pee often. Avoid holding your pee in for a long time.  Pee before and after having sex (intercourse).  Wipe from front to back after you poop (bowel movement) if you are a woman. Use each tissue only once. GET HELP RIGHT AWAY IF:   You have back pain.  You have lower belly (abdominal) pain.  You have chills.  You feel sick to your stomach (nauseous).  You throw up (vomit).  Your burning or discomfort with peeing does not go away.  You have a fever.  Your symptoms are not better in 3 days. MAKE SURE YOU:   Understand these instructions.  Will watch your condition.  Will get help right away if you are not doing well or get worse. Document Released: 12/05/2007 Document Revised: 03/12/2012 Document Reviewed: 01/17/2012 [facility] Patient Information 2015 [place], [place]. This information is not intended to replace advice given to you by your health care provider. Make sure you discuss any questions you have with your health care provider.

## 2014-08-16 NOTE — ED Notes (Signed)
PA at bedside.

## 2014-08-16 NOTE — ED Provider Notes (Signed)
CSN: [id number]     Arrival date & time 08/16/14  [id number] History   First MD Initiated Contact with Patient 08/16/14 [phone]     No chief complaint on file.    (Consider location/radiation/quality/duration/timing/severity/associated sxs/prior Treatment) HPI Comments: [name] is a 34 y.o. female with a PMHx of GERD, gastritis, IBS, anemia, asthma, and migraines, who presents to the ED with complaints of right flank pain that has been constant 2 months, but states that it is somewhat intermittent in its frequency over the last 2 months. Reports it started after giving birth on 06/13/14. She states the pain is 10/10 throbbing aching nonradiating with no known aggravating factors and relieved somewhat with lying on her back and left side, and with Percocet given to her on 08/08/14 when she was seen in the emergency room. She denies any fevers, chills, chest pain, shortness of breath, abdominal pain, nausea, vomiting, diarrhea, constipation, melena, hematochezia, vaginal bleeding/discharge, dysuria, hematuria, urinary frequency or urgency, malodorous or dark urine, numbness, tingling, weakness, or cauda equina symptoms. [facility] She has [facility] them for this pain and was told it was "nothing". Denies any injuries or heavy lifting.   Patient is a 34 y.o. female presenting with flank pain. The history is provided by the patient. No language interpreter was used.  Flank Pain This is a recurrent problem. The current episode started more than 1 month ago. The problem occurs daily. The problem has been unchanged. Pertinent negatives include no abdominal pain, arthralgias, chest pain, chills, fever, myalgias, nausea, neck pain, numbness, urinary symptoms, vomiting or weakness. Nothing aggravates the symptoms. She has tried oral narcotics and position changes for the symptoms. The treatment provided mild relief.    Past Medical History  Diagnosis Date  . GERD (gastroesophageal reflux  disease)   . Erosive esophagitis     distal esophagus, with low grade bleed 03/2009  . IBS (irritable bowel syndrome)   . Anemia   . History of migraine headaches   . Asthma   . Headache(784.0)   . Infection     UTI   Past Surgical History  Procedure Laterality Date  . No past surgeries     Family History  Problem Relation Age of Onset  . Migraines Mother   . Asthma Mother   . Migraines Father   . Heart disease Father   . Asthma Daughter   . Cancer Maternal Grandmother    History  Substance Use Topics  . Smoking status: Never Smoker   . Smokeless tobacco: Never Used  . Alcohol Use: No     Comment: occasional on holidays   OB History    Gravida Para Term Preterm AB TAB SAB Ectopic Multiple Living   3 3 3       0 3     Review of Systems  Constitutional: Negative for fever and chills.  Respiratory: Negative for shortness of breath.   Cardiovascular: Negative for chest pain.  Gastrointestinal: Negative for nausea, vomiting, abdominal pain, diarrhea, constipation and blood in stool.  Endocrine: Negative for polydipsia and polyuria.  Genitourinary: Positive for flank pain. Negative for dysuria, frequency, hematuria, vaginal bleeding and vaginal discharge.  Musculoskeletal: Positive for back pain (R flank area). Negative for myalgias, arthralgias and neck pain.  Skin: Negative for color change.  Allergic/Immunologic: Negative for immunocompromised state.  Neurological: Negative for weakness and numbness.       No cauda equina symptoms   10 Systems reviewed and are negative for acute  change except as noted in the HPI.    Allergies  Ibuprofen  Home Medications   Prior to Admission medications   Medication Sig Start Date End Date Taking? Authorizing Provider  albuterol (PROVENTIL HFA;VENTOLIN HFA) 108 (90 BASE) MCG/ACT inhaler Inhale 2 puffs into the lungs every 6 (six) hours as needed for wheezing or shortness of breath. 06/08/14   Deirdre Colin Mulders, CNM  cyclobenzaprine  (FLEXERIL) 10 MG tablet Take 1 tablet (10 mg total) by mouth 3 (three) times daily as needed (headaches). 07/29/14   Marlis Edelson, CNM  medroxyPROGESTERone (DEPO-PROVERA) 150 MG/ML injection Inject 150 mg into the muscle every 3 (three) months.    Historical Provider, MD  oxyCODONE-acetaminophen (PERCOCET/ROXICET) 5-325 MG per tablet Take 1 tablet by mouth every 4 (four) hours as needed. 08/08/14   Garlon Hatchet, PA-C   BP 145/83 mmHg  Pulse 62  Temp(Src) 98.2 F (36.8 C) (Oral)  SpO2 100%  LMP  Physical Exam  Constitutional: She is oriented to person, place, and time. Vital signs are normal. She appears well-developed and well-nourished.  Non-toxic appearance. No distress.  Afebrile nontoxic, tearful but NAD  HENT:  Head: Normocephalic and atraumatic.  Mouth/Throat: Oropharynx is clear and moist and mucous membranes are normal.  Eyes: Conjunctivae and EOM are normal. Right eye exhibits no discharge. Left eye exhibits no discharge.  Neck: Normal range of motion. Neck supple.  Cardiovascular: Normal rate, regular rhythm, normal heart sounds and intact distal pulses.  Exam reveals no gallop and no friction rub.   No murmur heard. Pulmonary/Chest: Effort normal and breath sounds normal. No respiratory distress. She has no decreased breath sounds. She has no wheezes. She has no rhonchi. She has no rales.  Abdominal: Soft. Normal appearance and bowel sounds are normal. She exhibits no distension. There is tenderness in the suprapubic area. There is no rigidity, no rebound, no guarding, no CVA tenderness, no tenderness at McBurney's point and negative Murphy's sign.    Soft, ND, +BS throughout, with very mild suprapubic TTP, no r/g/r, neg murphy's, neg mcburney's, no CVA TTP   Musculoskeletal: Normal range of motion.  Thoracic spine with FROM intact without spinous process TTP, no bony stepoffs or deformities, mild R sided paraspinous muscle TTP with trace muscle spasms. Strength 5/5 in all  extremities, sensation grossly intact in all extremities, negative SLR bilaterally, gait steady and nonanalgic. No overlying skin changes.   Neurological: She is alert and oriented to person, place, and time. She has normal strength. No sensory deficit.  Skin: Skin is warm, dry and intact. No rash noted.  Psychiatric: She has a normal mood and affect.  Nursing note and vitals reviewed.   ED Course  Procedures (including critical care time)  Emergency Focused Ultrasound Exam Limited retroperitoneal ultrasound of kidneys  Performed and interpreted by Camprubi-Soms, [place], PA-C Indication: flank pain Focused abdominal ultrasound with both kidneys imaged in transverse and longitudinal planes in real-time. Interpretation: no hydronephrosis visualized.   Images archived electronically   Labs Review Labs Reviewed  URINALYSIS, ROUTINE W REFLEX MICROSCOPIC - Abnormal; Notable for the following:    APPearance CLOUDY (*)    Hgb urine dipstick SMALL (*)    Protein, ur 30 (*)    Nitrite POSITIVE (*)    Leukocytes, UA LARGE (*)    All other components within normal limits  CBC WITH DIFFERENTIAL/PLATELET - Abnormal; Notable for the following:    RDW 18.3 (*)    All other components within normal limits  COMPREHENSIVE METABOLIC PANEL - Abnormal; Notable for the following:    Albumin 3.4 (*)    GFR calc non Af Amer 80 (*)    Anion gap 3 (*)    All other components within normal limits  URINE MICROSCOPIC-ADD ON - Abnormal; Notable for the following:    Bacteria, UA MANY (*)    All other components within normal limits  LIPASE, BLOOD  POC URINE PREG, ED    Imaging Review No results found.   EKG Interpretation None      MDM   Final diagnoses:  Right flank pain, chronic  Right-sided thoracic back pain  Back muscle spasm  UTI (lower urinary tract infection)    34 y.o. female here with R flank pain. No abd pain, n/v/d/c, with nontender abdomen. No vaginal complaints.  Exam reveals mostly musculoskeletal pain, although renal colic is on the differential. Will check labs but hold on imaging for right now. Will give morphine and fluids for pain. Will reassess shortly  12:25 PM Bedside U/S reveals no hydronephrosis. Upreg neg. U/A cloudy with +nitrites, +leuks, TNTC WBC, and many bacteria consistent with UTI. No leukocytosis but since she's had flank pain, will give rocephin dose here and then d/c home with bactrim. CMP WNL. Lipase WNL. Will give morphine again now for pain, as well as flexeril. Will send home with percocet and flexeril and have her f/up with PCP. I explained the diagnosis and have given explicit precautions to return to the ER including for any other new or worsening symptoms. The patient understands and accepts the medical plan as it's been dictated and I have answered their questions. Discharge instructions concerning home care and prescriptions have been given. The patient is STABLE and is discharged to home in good condition.  BP 152/81 mmHg  Pulse 79  Temp(Src) 98.2 F (36.8 C) (Oral)  Resp 17  SpO2 100%  LMP   Meds ordered this encounter  Medications  . sodium chloride 0.9 % bolus 1,000 mL    Sig:   . morphine 4 MG/ML injection 4 mg    Sig:   . morphine 4 MG/ML injection 4 mg    Sig:   . cefTRIAXone (ROCEPHIN) 1 g in dextrose 5 % 50 mL IVPB    Sig:   . cyclobenzaprine (FLEXERIL) tablet 5 mg    Sig:   . cyclobenzaprine (FLEXERIL) 10 MG tablet    Sig: Take 1 tablet (10 mg total) by mouth 3 (three) times daily as needed for muscle spasms.    Dispense:  15 tablet    Refill:  0    Order Specific Question:  Supervising Provider    Answer:  Eber Hong D [[id number]]  . oxyCODONE-acetaminophen (PERCOCET) 5-325 MG per tablet    Sig: Take 1 tablet by mouth every 6 (six) hours as needed for severe pain.    Dispense:  6 tablet    Refill:  0    Order Specific Question:  Supervising Provider    Answer:  Eber Hong D [[id number]]  .  sulfamethoxazole-trimethoprim (BACTRIM DS,SEPTRA DS) 800-160 MG per tablet    Sig: Take 1 tablet by mouth 2 (two) times daily.    Dispense:  14 tablet    Refill:  0    Order Specific Question:  Supervising Provider    Answer:  Vida Roller [address] Kamaron Deskins Camprubi-Soms, PA-C 08/16/14 1231  Enid Skeens, MD 08/16/14 [phone]

## 2014-09-08 ENCOUNTER — Ambulatory Visit: Payer: Medicaid Other

## 2014-09-09 ENCOUNTER — Ambulatory Visit (INDEPENDENT_AMBULATORY_CARE_PROVIDER_SITE_OTHER): Payer: Medicaid Other | Admitting: *Deleted

## 2014-09-09 VITALS — BP 137/81 | HR 87 | Temp 98.4°F | Wt 202.5 lb

## 2014-09-09 DIAGNOSIS — N938 Other specified abnormal uterine and vaginal bleeding: Secondary | ICD-10-CM

## 2014-09-09 MED ORDER — MEDROXYPROGESTERONE ACETATE 150 MG/ML IM SUSP
150.0000 mg | Freq: Once | INTRAMUSCULAR | Status: AC
  Administered 2014-09-09: 150 mg via INTRAMUSCULAR

## 2014-09-18 ENCOUNTER — Encounter (HOSPITAL_COMMUNITY): Payer: Self-pay | Admitting: Emergency Medicine

## 2014-09-18 ENCOUNTER — Emergency Department (HOSPITAL_COMMUNITY)
Admission: EM | Admit: 2014-09-18 | Discharge: 2014-09-18 | Disposition: A | Discharge status: Home / Self Care | Payer: Medicaid Other | Source: Home / Self Care | Attending: Family Medicine | Admitting: Family Medicine

## 2014-09-18 DIAGNOSIS — J029 Acute pharyngitis, unspecified: Secondary | ICD-10-CM

## 2014-09-18 LAB — POCT RAPID STREP A: Streptococcus, Group A Screen (Direct): NEGATIVE

## 2014-09-18 MED ORDER — PREDNISONE 10 MG PO TABS: 30.0000 mg | ORAL_TABLET | Freq: Every day | ORAL | Status: DC

## 2014-09-18 MED ORDER — FLUTICASONE PROPIONATE 50 MCG/ACT NA SUSP: 2.0000 | Freq: Every day | NASAL | Status: DC

## 2014-09-18 NOTE — ED Provider Notes (Signed)
[name] is a 34 y.o. female who presents to Urgent Care today for hoarse voice sore throat present for 5 days. Patient has mild cough. No shortness of breath wheezing or palpitations. She has tried some over-the-counter medicines helped some. No vomiting or diarrhea.   Past Medical History  Diagnosis Date  . GERD (gastroesophageal reflux disease)   . Erosive esophagitis     distal esophagus, with low grade bleed 03/2009  . IBS (irritable bowel syndrome)   . Anemia   . History of migraine headaches   . Asthma   . Headache(784.0)   . Infection     UTI   Past Surgical History  Procedure Laterality Date  . No past surgeries     History  Substance Use Topics  . Smoking status: Never Smoker   . Smokeless tobacco: Never Used  . Alcohol Use: No     Comment: occasional on holidays   ROS as above Medications: No current facility-administered medications for this encounter.   Current Outpatient Prescriptions  Medication Sig Dispense Refill  . albuterol (PROVENTIL HFA;VENTOLIN HFA) 108 (90 BASE) MCG/ACT inhaler Inhale 2 puffs into the lungs every 6 (six) hours as needed for wheezing or shortness of breath. 1 Inhaler 2  . fluticasone (FLONASE) 50 MCG/ACT nasal spray Place 2 sprays into both nostrils daily. 16 g 2  . medroxyPROGESTERone (DEPO-PROVERA) 150 MG/ML injection Inject 150 mg into the muscle every 3 (three) months.    . predniSONE (DELTASONE) 10 MG tablet Take 3 tablets (30 mg total) by mouth daily. 15 tablet 0   Allergies  Allergen Reactions  . Ibuprofen Other (See Comments)    Ulcer flare up      Exam:  BP 144/87 mmHg  Pulse 81  Temp(Src) 98.4 F (36.9 C) (Oral)  Resp 16  SpO2 97% Gen: Well NAD HEENT: EOMI,  MMM clear nasal discharge. Posterior pharynx with cobblestoning. Normal tympanic membranes bilaterally Lungs: Normal work of breathing. CTABL hoarse voice Heart: RRR no MRG Abd: NABS, Soft. Nondistended, Nontender Exts: Brisk capillary refill, warm  and well perfused.   Results for orders placed or performed during the hospital encounter of 09/18/14 (from the past 24 hour(s))  POCT rapid strep A [facility])     Status: None   Collection Time: 09/18/14 10:07 AM  Result Value Ref Range   Streptococcus, Group A Screen (Direct) NEGATIVE NEGATIVE   No results found.  Assessment and Plan: 34 y.o. female with viral versus allergic pharyngitis and postnasal drip. Treat with prednisone and Flonase nasal spray.  Discussed warning signs or symptoms. Please see discharge instructions. Patient expresses understanding.     Rodolph BongEvan S Camela Wich, MD 09/18/14 1019

## 2014-09-18 NOTE — ED Notes (Signed)
Pt states that she has had a sore throat since Monday with voice hoarseness that started Friday.

## 2014-09-18 NOTE — Discharge Instructions (Signed)
Thank you for coming in today. °Call or go to the emergency room if you get worse, have trouble breathing, have chest pains, or palpitations.  ° °Pharyngitis °Pharyngitis is redness, pain, and swelling (inflammation) of your pharynx.  °CAUSES  °Pharyngitis is usually caused by infection. Most of the time, these infections are from viruses (viral) and are part of a cold. However, sometimes pharyngitis is caused by bacteria (bacterial). Pharyngitis can also be caused by allergies. Viral pharyngitis may be spread from person to person by coughing, sneezing, and personal items or utensils (cups, forks, spoons, toothbrushes). Bacterial pharyngitis may be spread from person to person by more intimate contact, such as kissing.  °SIGNS AND SYMPTOMS  °Symptoms of pharyngitis include:   °· Sore throat.   °· Tiredness (fatigue).   °· Low-grade fever.   °· Headache. °· Joint pain and muscle aches. °· Skin rashes. °· Swollen lymph nodes. °· Plaque-like film on throat or tonsils (often seen with bacterial pharyngitis). °DIAGNOSIS  °Your health care provider will ask you questions about your illness and your symptoms. Your medical history, along with a physical exam, is often all that is needed to diagnose pharyngitis. Sometimes, a rapid strep test is done. Other lab tests may also be done, depending on the suspected cause.  °TREATMENT  °Viral pharyngitis will usually get better in 3-4 days without the use of medicine. Bacterial pharyngitis is treated with medicines that kill germs (antibiotics).  °HOME CARE INSTRUCTIONS  °· Drink enough water and fluids to keep your urine clear or pale yellow.   °· Only take over-the-counter or prescription medicines as directed by your health care provider:   °¨ If you are prescribed antibiotics, make sure you finish them even if you start to feel better.   °¨ Do not take aspirin.   °· Get lots of rest.   °· Gargle with 8 oz of salt water (½ tsp of salt per 1 qt of water) as often as every 1-2  hours to soothe your throat.   °· Throat lozenges (if you are not at risk for choking) or sprays may be used to soothe your throat. °SEEK MEDICAL CARE IF:  °· You have large, tender lumps in your neck. °· You have a rash. °· You cough up green, yellow-brown, or bloody spit. °SEEK IMMEDIATE MEDICAL CARE IF:  °· Your neck becomes stiff. °· You drool or are unable to swallow liquids. °· You vomit or are unable to keep medicines or liquids down. °· You have severe pain that does not go away with the use of recommended medicines. °· You have trouble breathing (not caused by a stuffy nose). °MAKE SURE YOU:  °· Understand these instructions. °· Will watch your condition. °· Will get help right away if you are not doing well or get worse. °Document Released: 06/18/2005 Document Revised: 04/08/2013 Document Reviewed: 02/23/2013 °[employer]® Patient Information ©2015 [employer]. This information is not intended to replace advice given to you by your health care provider. Make sure you discuss any questions you have with your health care provider. ° ° ° °

## 2014-09-22 LAB — CULTURE, GROUP A STREP: Strep A Culture: NEGATIVE

## 2014-10-29 ENCOUNTER — Emergency Department (INDEPENDENT_AMBULATORY_CARE_PROVIDER_SITE_OTHER)
Admission: EM | Admit: 2014-10-29 | Discharge: 2014-10-29 | Disposition: A | Discharge status: Home / Self Care | Payer: Medicaid Other | Source: Home / Self Care | Attending: Family Medicine | Admitting: Family Medicine

## 2014-10-29 ENCOUNTER — Encounter (HOSPITAL_COMMUNITY): Payer: Self-pay | Admitting: Emergency Medicine

## 2014-10-29 DIAGNOSIS — M5442 Lumbago with sciatica, left side: Secondary | ICD-10-CM | POA: Diagnosis not present

## 2014-10-29 MED ORDER — PREDNISONE 10 MG PO TABS: ORAL_TABLET | ORAL | Status: DC

## 2014-10-29 MED ORDER — HYDROCODONE-ACETAMINOPHEN 5-325 MG PO TABS: 1.0000 | ORAL_TABLET | Freq: Four times a day (QID) | ORAL | Status: DC | PRN

## 2014-10-29 NOTE — ED Provider Notes (Signed)
CSN: [id number]     Arrival date & time 10/29/14  1932 History   First MD Initiated Contact with Patient 10/29/14 2043     Chief Complaint  Patient presents with  . Back Pain   (Consider location/radiation/quality/duration/timing/severity/associated sxs/prior Treatment) HPI         34 year old female presents complaining of back pain. She has lower back pain that is radiating down her left leg to about the knee. This started one week ago, she was trying to wait it out and taken over-the-counter medications but seems to get started getting worse and sedative better so she wanted to be evaluated. She initially injured her back about 10 years ago, she had a ruptured disc. She went through physical therapy, spinal steroid injections, and eventually this got better after about 3 years. She has been for the most part symptom-free for the past 7 years and takes no regular medications for her back. She was doing a lot of lifting at work last week which she thinks has exacerbated this. She denies any specific injury. She denies any shoulder many numbness or weakness or any difficulty with ambulation. She denies loss of bowel or bladder control. Denies any history of IV drug use. She denies any current narcotic use for chronic narcotic use  Past Medical History  Diagnosis Date  . GERD (gastroesophageal reflux disease)   . Erosive esophagitis     distal esophagus, with low grade bleed 03/2009  . IBS (irritable bowel syndrome)   . Anemia   . History of migraine headaches   . Asthma   . Headache(784.0)   . Infection     UTI   Past Surgical History  Procedure Laterality Date  . No past surgeries     Family History  Problem Relation Age of Onset  . Migraines Mother   . Asthma Mother   . Migraines Father   . Heart disease Father   . Asthma Daughter   . Cancer Maternal Grandmother    History  Substance Use Topics  . Smoking status: Never Smoker   . Smokeless tobacco: Never Used  . Alcohol Use:  No     Comment: occasional on holidays   OB History    Gravida Para Term Preterm AB TAB SAB Ectopic Multiple Living   0 3     Review of Systems  Musculoskeletal: Positive for back pain (radiating down left leg).  Neurological: Negative for weakness and numbness.  All other systems reviewed and are negative.   Allergies  Ibuprofen  Home Medications   Prior to Admission medications   Medication Sig Start Date End Date Taking? Authorizing Provider  albuterol (PROVENTIL HFA;VENTOLIN HFA) 108 (90 BASE) MCG/ACT inhaler Inhale 2 puffs into the lungs every 6 (six) hours as needed for wheezing or shortness of breath. 06/08/14   Deirdre C Poe, CNM  fluticasone (FLONASE) 50 MCG/ACT nasal spray Place 2 sprays into both nostrils daily. 09/18/14   Rodolph Bong, MD  HYDROcodone-acetaminophen (NORCO) 5-325 MG per tablet Take 1 tablet by mouth every 6 (six) hours as needed for moderate pain. 10/29/14   Graylon Good, PA-C  medroxyPROGESTERone (DEPO-PROVERA) 150 MG/ML injection Inject 150 mg into the muscle every 3 (three) months.    Historical Provider, MD  predniSONE (DELTASONE) 10 MG tablet Take 3 tablets (30 mg total) by mouth daily. 09/18/14   Rodolph Bong, MD  predniSONE (DELTASONE) 10 MG tablet 4 tabs PO QD for  4 days; 3 tabs PO QD for 3 days; 2 tabs PO QD for 2 days; 1 tab PO QD for 1 day 10/29/14   Graylon GoodZachary H Nasia Cannan, PA-C   BP 140/85 mmHg  Pulse 69  Temp(Src) 98.7 F (37.1 C) (Oral)  Resp 20  SpO2 100% Physical Exam  Constitutional: She is oriented to person, place, and time. Vital signs are normal. She appears well-developed and well-nourished. No distress.  HENT:  Head: Normocephalic and atraumatic.  Cardiovascular: Normal rate, regular rhythm and normal heart sounds.   Pulmonary/Chest: Effort normal and breath sounds normal. No respiratory distress.  Musculoskeletal:       Thoracic back: Normal.       Lumbar back: She exhibits decreased range of motion, tenderness, bony  tenderness and pain. She exhibits no swelling, no edema and no spasm.  Diffuse lumbar spinous tenderness without any focal tenderness or deformity  Neurological: She is alert and oriented to person, place, and time. She has normal strength and normal reflexes. No sensory deficit. She exhibits normal muscle tone. She displays a negative Romberg sign. Coordination and gait normal.  Skin: Skin is warm and dry. No rash noted. She is not diaphoretic.  Psychiatric: She has a normal mood and affect. Judgment normal.  Nursing note and vitals reviewed.   ED Course  Procedures (including critical care time) Labs Review Labs Reviewed - No data to display  Imaging Review No results found.   MDM   1. Midline low back pain with left-sided sciatica    I discussed with her that she will probably benefit from going to physical therapy again, she will follow-up with her primary care provider to arrange this. We will treat her acutely with prednisone and a few tablets of hydrocodone for pain. Return precautions discussed, follow-up when necessary   Meds ordered this encounter  Medications  . predniSONE (DELTASONE) 10 MG tablet    Sig: 4 tabs PO QD for 4 days; 3 tabs PO QD for 3 days; 2 tabs PO QD for 2 days; 1 tab PO QD for 1 day    Dispense:  30 tablet    Refill:  0  . HYDROcodone-acetaminophen (NORCO) 5-325 MG per tablet    Sig: Take 1 tablet by mouth every 6 (six) hours as needed for moderate pain.    Dispense:  15 tablet    Refill:  0     Graylon GoodZachary H Caitlinn Klinker, PA-C 10/30/14 [phone]

## 2014-10-29 NOTE — ED Notes (Signed)
Patient presents with back pain x 6 days. Patient reports a hx of slipped disc. Patient reports pain is radiating down her right leg. Patient is in NAD.

## 2014-10-29 NOTE — Discharge Instructions (Signed)
Back Pain, Adult °Back pain is very common. The pain often gets better over time. The cause of back pain is usually not dangerous. Most people can learn to manage their back pain on their own.  °HOME CARE  °· Stay active. Start with short walks on flat ground if you can. Try to walk farther each day. °· Do not sit, drive, or stand in one place for more than 30 minutes. Do not stay in bed. °· Do not avoid exercise or work. Activity can help your back heal faster. °· Be careful when you bend or lift an object. Bend at your knees, keep the object close to you, and do not twist. °· Sleep on a firm mattress. Lie on your side, and bend your knees. If you lie on your back, put a pillow under your knees. °· Only take medicines as told by your doctor. °· Put ice on the injured area. °· Put ice in a plastic bag. °· Place a towel between your skin and the bag. °· Leave the ice on for 15-20 minutes, 03-04 times a day for the first 2 to 3 days. After that, you can switch between ice and heat packs. °· Ask your doctor about back exercises or massage. °· Avoid feeling anxious or stressed. Find good ways to deal with stress, such as exercise. °GET HELP RIGHT AWAY IF:  °· Your pain does not go away with rest or medicine. °· Your pain does not go away in 1 week. °· You have new problems. °· You do not feel well. °· The pain spreads into your legs. °· You cannot control when you poop (bowel movement) or pee (urinate). °· Your arms or legs feel weak or lose feeling (numbness). °· You feel sick to your stomach (nauseous) or throw up (vomit). °· You have belly (abdominal) pain. °· You feel like you may pass out (faint). °MAKE SURE YOU:  °· Understand these instructions. °· Will watch your condition. °· Will get help right away if you are not doing well or get worse. °Document Released: 12/05/2007 Document Revised: 09/10/2011 Document Reviewed: 10/20/2013 °ExitCare® Patient Information ©2015 [employer]. This information is not intended  to replace advice given to you by your health care provider. Make sure you discuss any questions you have with your health care provider. °Sciatica °Sciatica is pain, weakness, numbness, or tingling along the path of the sciatic nerve. The nerve starts in the lower back and runs down the back of each leg. The nerve controls the muscles in the lower leg and in the back of the knee, while also providing sensation to the back of the thigh, lower leg, and the sole of your foot. Sciatica is a symptom of another medical condition. For instance, nerve damage or certain conditions, such as a herniated disk or bone spur on the spine, pinch or put pressure on the sciatic nerve. This causes the pain, weakness, or other sensations normally associated with sciatica. Generally, sciatica only affects one side of the body. °CAUSES  °· Herniated or slipped disc. °· Degenerative disk disease. °· A pain disorder involving the narrow muscle in the buttocks (piriformis syndrome). °· Pelvic injury or fracture. °· Pregnancy. °· Tumor (rare). °SYMPTOMS  °Symptoms can vary from mild to very severe. The symptoms usually travel from the low back to the buttocks and down the back of the leg. Symptoms can include: °· Mild tingling or dull aches in the lower back, leg, or hip. °· Numbness in the back   of the calf or sole of the foot. °· Burning sensations in the lower back, leg, or hip. °· Sharp pains in the lower back, leg, or hip. °· Leg weakness. °· Severe back pain inhibiting movement. °These symptoms may get worse with coughing, sneezing, laughing, or prolonged sitting or standing. Also, being overweight may worsen symptoms. °DIAGNOSIS  °Your caregiver will perform a physical exam to look for common symptoms of sciatica. He or she may ask you to do certain movements or activities that would trigger sciatic nerve pain. Other tests may be performed to find the cause of the sciatica. These may include: °· Blood tests. °· X-rays. °· Imaging  tests, such as an MRI or CT scan. °TREATMENT  °Treatment is directed at the cause of the sciatic pain. Sometimes, treatment is not necessary and the pain and discomfort goes away on its own. If treatment is needed, your caregiver may suggest: °· Over-the-counter medicines to relieve pain. °· Prescription medicines, such as anti-inflammatory medicine, muscle relaxants, or narcotics. °· Applying heat or ice to the painful area. °· Steroid injections to lessen pain, irritation, and inflammation around the nerve. °· Reducing activity during periods of pain. °· Exercising and stretching to strengthen your abdomen and improve flexibility of your spine. Your caregiver may suggest losing weight if the extra weight makes the back pain worse. °· Physical therapy. °· Surgery to eliminate what is pressing or pinching the nerve, such as a bone spur or part of a herniated disk. °HOME CARE INSTRUCTIONS  °· Only take over-the-counter or prescription medicines for pain or discomfort as directed by your caregiver. °· Apply ice to the affected area for 20 minutes, 3-4 times a day for the first 48-72 hours. Then try heat in the same way. °· Exercise, stretch, or perform your usual activities if these do not aggravate your pain. °· Attend physical therapy sessions as directed by your caregiver. °· Keep all follow-up appointments as directed by your caregiver. °· Do not wear high heels or shoes that do not provide proper support. °· Check your mattress to see if it is too soft. A firm mattress may lessen your pain and discomfort. °SEEK IMMEDIATE MEDICAL CARE IF:  °· You lose control of your bowel or bladder (incontinence). °· You have increasing weakness in the lower back, pelvis, buttocks, or legs. °· You have redness or swelling of your back. °· You have a burning sensation when you urinate. °· You have pain that gets worse when you lie down or awakens you at night. °· Your pain is worse than you have experienced in the past. °· Your  pain is lasting longer than 4 weeks. °· You are suddenly losing weight without reason. °MAKE SURE YOU: °· Understand these instructions. °· Will watch your condition. °· Will get help right away if you are not doing well or get worse. °Document Released: 06/12/2001 Document Revised: 12/18/2011 Document Reviewed: 10/28/2011 °ExitCare® Patient Information ©2015 [employer]. This information is not intended to replace advice given to you by your health care provider. Make sure you discuss any questions you have with your health care provider. ° °

## 2014-11-23 ENCOUNTER — Emergency Department (HOSPITAL_COMMUNITY)
Admission: EM | Admit: 2014-11-23 | Discharge: 2014-11-23 | Disposition: A | Discharge status: Home / Self Care | Payer: Medicaid Other | Attending: Emergency Medicine | Admitting: Emergency Medicine

## 2014-11-23 ENCOUNTER — Encounter (HOSPITAL_COMMUNITY): Payer: Self-pay | Admitting: Emergency Medicine

## 2014-11-23 DIAGNOSIS — G43909 Migraine, unspecified, not intractable, without status migrainosus: Secondary | ICD-10-CM | POA: Insufficient documentation

## 2014-11-23 DIAGNOSIS — Z7951 Long term (current) use of inhaled steroids: Secondary | ICD-10-CM | POA: Insufficient documentation

## 2014-11-23 DIAGNOSIS — Z87440 Personal history of urinary (tract) infections: Secondary | ICD-10-CM | POA: Insufficient documentation

## 2014-11-23 DIAGNOSIS — Z862 Personal history of diseases of the blood and blood-forming organs and certain disorders involving the immune mechanism: Secondary | ICD-10-CM | POA: Diagnosis not present

## 2014-11-23 DIAGNOSIS — J45909 Unspecified asthma, uncomplicated: Secondary | ICD-10-CM | POA: Insufficient documentation

## 2014-11-23 DIAGNOSIS — Z79899 Other long term (current) drug therapy: Secondary | ICD-10-CM | POA: Insufficient documentation

## 2014-11-23 DIAGNOSIS — M542 Cervicalgia: Secondary | ICD-10-CM | POA: Diagnosis present

## 2014-11-23 DIAGNOSIS — Z8719 Personal history of other diseases of the digestive system: Secondary | ICD-10-CM | POA: Insufficient documentation

## 2014-11-23 MED ORDER — DIAZEPAM 5 MG PO TABS
5.0000 mg | ORAL_TABLET | Freq: Once | ORAL | Status: AC
  Administered 2014-11-23: 5 mg via ORAL
  Filled 2014-11-23: qty 1

## 2014-11-23 MED ORDER — FENTANYL CITRATE (PF) 100 MCG/2ML IJ SOLN
50.0000 ug | Freq: Once | INTRAMUSCULAR | Status: AC
  Administered 2014-11-23: 50 ug via INTRAVENOUS
  Filled 2014-11-23: qty 2

## 2014-11-23 MED ORDER — NAPROXEN SODIUM 220 MG PO TABS: 220.0000 mg | ORAL_TABLET | Freq: Two times a day (BID) | ORAL | Status: DC

## 2014-11-23 MED ORDER — CYCLOBENZAPRINE HCL 10 MG PO TABS: 10.0000 mg | ORAL_TABLET | Freq: Two times a day (BID) | ORAL | Status: DC | PRN

## 2014-11-23 MED ORDER — KETOROLAC TROMETHAMINE 30 MG/ML IJ SOLN
30.0000 mg | Freq: Once | INTRAMUSCULAR | Status: AC
  Administered 2014-11-23: 30 mg via INTRAVENOUS
  Filled 2014-11-23: qty 1

## 2014-11-23 NOTE — ED Notes (Signed)
Nurse occupied at the moment due to critical pt.

## 2014-11-23 NOTE — ED Notes (Signed)
Patient reports pain that started as a crook in her neck " a few weeks ago", and it's getting worse instead of better. Unable to sleep.

## 2014-11-23 NOTE — ED Notes (Signed)
Patient states she last took aleve at 7:53pm since she ran out of muscle relaxants a few days ago.

## 2014-11-23 NOTE — ED Provider Notes (Signed)
CSN: [id number]     Arrival date & time 11/23/14  4098 History   First MD Initiated Contact with Patient 11/23/14 [phone]     Chief Complaint  Patient presents with  . Neck Pain      HPI Patient presents to the emergency department with ongoing discomfort and pain in her neck over the past several weeks.  She has tried anti-inflammatories and muscle relaxants without improvement.  She is currently out of her muscle relaxants.  She denies weakness in arms or legs.  She's had difficulty sleeping because of this.  She states there is some radiation of the pain down her left arm.  She denies chest pain or shortness of breath.   Past Medical History  Diagnosis Date  . GERD (gastroesophageal reflux disease)   . Erosive esophagitis     distal esophagus, with low grade bleed 03/2009  . IBS (irritable bowel syndrome)   . Anemia   . History of migraine headaches   . Asthma   . Headache(784.0)   . Infection     UTI   Past Surgical History  Procedure Laterality Date  . No past surgeries     Family History  Problem Relation Age of Onset  . Migraines Mother   . Asthma Mother   . Migraines Father   . Heart disease Father   . Asthma Daughter   . Cancer Maternal Grandmother    History  Substance Use Topics  . Smoking status: Never Smoker   . Smokeless tobacco: Never Used  . Alcohol Use: No     Comment: occasional on holidays   OB History    Gravida Para Term Preterm AB TAB SAB Ectopic Multiple Living   0 3     Review of Systems  All other systems reviewed and are negative.     Allergies  Ibuprofen  Home Medications   Prior to Admission medications   Medication Sig Start Date End Date Taking? Authorizing Provider  albuterol (PROVENTIL HFA;VENTOLIN HFA) 108 (90 BASE) MCG/ACT inhaler Inhale 2 puffs into the lungs every 6 (six) hours as needed for wheezing or shortness of breath. Patient not taking: Reported on 11/23/2014 06/08/14   Deirdre C Poe, CNM   cyclobenzaprine (FLEXERIL) 10 MG tablet Take 1 tablet (10 mg total) by mouth 2 (two) times daily as needed for muscle spasms. 11/23/14   Azalia Bilis, MD  fluticasone [facility]) 50 MCG/ACT nasal spray Place 2 sprays into both nostrils daily. Patient not taking: Reported on 11/23/2014 09/18/14   Rodolph Bong, MD  naproxen sodium (ALEVE) 220 MG tablet Take 1 tablet (220 mg total) by mouth 2 (two) times daily with a meal. 11/23/14   Azalia Bilis, MD   BP 126/82 mmHg  Pulse 75  Temp(Src) 98.6 F (37 C) (Oral)  Resp 18  Ht  (1.676 m)  Wt 202 lb (91.627 kg)  BMI 32.62 kg/m2  SpO2 100%  LMP  Physical Exam  Constitutional: She is oriented to person, place, and time. She appears well-developed and well-nourished. No distress.  HENT:  Head: Normocephalic and atraumatic.  Eyes: EOM are normal.  Neck: Normal range of motion. Neck supple. No tracheal deviation present. No thyromegaly present.  Mild paracervical tenderness left greater than right.  No midline C-spine tenderness.  No rash or overlying skin changes.  Cardiovascular: Normal rate, regular rhythm and normal heart sounds.   Pulmonary/Chest: Effort normal and breath sounds normal.  Abdominal:  Soft. She exhibits no distension. There is no tenderness.  Musculoskeletal: Normal range of motion.  Neurological: She is alert and oriented to person, place, and time.  Skin: Skin is warm and dry.  Psychiatric: She has a normal mood and affect. Judgment normal.  Nursing note and vitals reviewed.   ED Course  Procedures (including critical care time) Labs Review Labs Reviewed - No data to display  Imaging Review No results found.   EKG Interpretation None      MDM   Final diagnoses:  Neck pain      No neck injury.  C-spine nontender.  Doubt spinal infection.  Anterior neck is normal.  This is likely cervical muscle strain/spasm.  Patient will need a primary care physician.  We'll place back on muscle relaxants and  anti-inflammatories.  Patient will benefit from heat, time, possible physical therapy.  Azalia BilisKevin Stavros Cail, MD 11/23/14 [phone]

## 2014-11-23 NOTE — ED Notes (Signed)
Pt. States pain in her neck started couple weeks ago on the back left side of her neck and radiates down left arm to her elbow. Pain has gotten increasingly worse to where she cant sleep. Allergic to ibuprofen so has been taking aleve and cyclobenzaprine with no relief. No meds taken today. Pt. Cant lay on her right side due to severe pain and creates a "pulling" feeling in her chest. No sob/cp. Pain 10/10.

## 2014-11-25 ENCOUNTER — Ambulatory Visit: Payer: Medicaid Other

## 2014-12-01 ENCOUNTER — Ambulatory Visit (INDEPENDENT_AMBULATORY_CARE_PROVIDER_SITE_OTHER): Payer: Medicaid Other | Admitting: *Deleted

## 2014-12-01 VITALS — BP 136/92 | HR 80

## 2014-12-01 DIAGNOSIS — Z3042 Encounter for surveillance of injectable contraceptive: Secondary | ICD-10-CM

## 2014-12-01 MED ORDER — MEDROXYPROGESTERONE ACETATE 150 MG/ML IM SUSP
150.0000 mg | Freq: Once | INTRAMUSCULAR | Status: AC
  Administered 2014-12-01: 150 mg via INTRAMUSCULAR

## 2014-12-14 ENCOUNTER — Ambulatory Visit (INDEPENDENT_AMBULATORY_CARE_PROVIDER_SITE_OTHER): Payer: Medicaid Other | Admitting: Family Medicine

## 2014-12-14 ENCOUNTER — Encounter: Payer: Self-pay | Admitting: Family Medicine

## 2014-12-14 VITALS — BP 132/87 | HR 91 | Temp 98.1°F | Resp 16 | Ht 66.0 in | Wt 202.0 lb

## 2014-12-14 DIAGNOSIS — R030 Elevated blood-pressure reading, without diagnosis of hypertension: Secondary | ICD-10-CM

## 2014-12-14 DIAGNOSIS — M549 Dorsalgia, unspecified: Secondary | ICD-10-CM | POA: Diagnosis not present

## 2014-12-14 DIAGNOSIS — E8881 Metabolic syndrome: Secondary | ICD-10-CM | POA: Diagnosis not present

## 2014-12-14 DIAGNOSIS — G8929 Other chronic pain: Secondary | ICD-10-CM | POA: Insufficient documentation

## 2014-12-14 DIAGNOSIS — J4520 Mild intermittent asthma, uncomplicated: Secondary | ICD-10-CM

## 2014-12-14 DIAGNOSIS — J302 Other seasonal allergic rhinitis: Secondary | ICD-10-CM | POA: Diagnosis not present

## 2014-12-14 LAB — LIPID PANEL
Cholesterol: 158 mg/dL (ref 0–200)
HDL: 61 mg/dL (ref >=46)
LDL CALC: 76 mg/dL (ref 0–99)
Total CHOL/HDL Ratio: 2.6 Ratio
Triglycerides: 104 mg/dL (ref <150)
VLDL: 21 mg/dL (ref 0–40)

## 2014-12-14 LAB — POCT URINALYSIS DIP (DEVICE)
Bilirubin Urine: NEGATIVE
GLUCOSE, UA: NEGATIVE mg/dL
Hgb urine dipstick: NEGATIVE
KETONES UR: NEGATIVE mg/dL
Leukocytes, UA: NEGATIVE
NITRITE: NEGATIVE
Protein, ur: NEGATIVE mg/dL
Urobilinogen, UA: 0.2 mg/dL (ref 0.0–1.0)
pH: 6 (ref 5.0–8.0)

## 2014-12-14 LAB — CBC WITH DIFFERENTIAL/PLATELET
Basophils Absolute: 0.1 10*3/uL (ref 0.0–0.1)
Basophils Relative: 1 % (ref 0–1)
Eosinophils Absolute: 0.2 10*3/uL (ref 0.0–0.7)
Eosinophils Relative: 3 % (ref 0–5)
HEMATOCRIT: 39.2 % (ref 36.0–46.0)
HEMOGLOBIN: 12.9 g/dL (ref 12.0–15.0)
LYMPHS ABS: 3.2 10*3/uL (ref 0.7–4.0)
LYMPHS PCT: 47 % — AB (ref 12–46)
MCH: 30.6 pg (ref 26.0–34.0)
MCHC: 32.9 g/dL (ref 30.0–36.0)
MCV: 92.9 fL (ref 78.0–100.0)
MONO ABS: 0.4 10*3/uL (ref 0.1–1.0)
MPV: 10.6 fL (ref 8.6–12.4)
Monocytes Relative: 6 % (ref 3–12)
Neutro Abs: 2.9 10*3/uL (ref 1.7–7.7)
Neutrophils Relative %: 43 % (ref 43–77)
PLATELETS: 351 10*3/uL (ref 150–400)
RBC: 4.22 MIL/uL (ref 3.87–5.11)
RDW: 14.3 % (ref 11.5–15.5)
WBC: 6.8 10*3/uL (ref 4.0–10.5)

## 2014-12-14 LAB — COMPLETE METABOLIC PANEL WITH GFR
ALBUMIN: 4.2 g/dL (ref 3.5–5.2)
ALT: 11 U/L (ref 0–35)
AST: 13 U/L (ref 0–37)
Alkaline Phosphatase: 63 U/L (ref 39–117)
BUN: 12 mg/dL (ref 6–23)
CALCIUM: 9.1 mg/dL (ref 8.4–10.5)
CO2: 24 mEq/L (ref 19–32)
Chloride: 105 mEq/L (ref 96–112)
Creat: 0.76 mg/dL (ref 0.50–1.10)
GFR, Est African American: >89 mL/min
GFR, Est Non African American: >89 mL/min
Glucose, Bld: 75 mg/dL (ref 70–99)
POTASSIUM: 3.7 meq/L (ref 3.5–5.3)
Sodium: 138 mEq/L (ref 135–145)
Total Bilirubin: 0.3 mg/dL (ref 0.2–1.2)
Total Protein: 7.1 g/dL (ref 6.0–8.3)

## 2014-12-14 LAB — TSH: TSH: 0.329 u[IU]/mL — ABNORMAL LOW (ref 0.350–4.500)

## 2014-12-14 MED ORDER — ACETAMINOPHEN 500 MG PO TABS: 500.0000 mg | ORAL_TABLET | Freq: Four times a day (QID) | ORAL | Status: DC | PRN

## 2014-12-14 NOTE — Patient Instructions (Signed)

## 2014-12-14 NOTE — Progress Notes (Signed)
Subjective:    Patient ID: [name], female    DOB: 12/25/1980, 34 y.o.   MRN: [id number]  HPI  [name], a 34 year old female with a history of chronic back pain and asthma  presents to establish care. Patient states that she was a patient of [facility], however; she missed several appointments and was unable to continue.    Patient sustained a fall. Patient states that she right lower back pain, aching, and sharp at times. Patient states that she has a "catch" with bending, lifting, and turning. Patient states that during painful episodes, it. Patient states that she was going to Dr. Hessie Diener, chiropractor.  Symptoms have been present for several years and include pain in right lumbar sacral region (aching and throbbing in character; 2/10 in severity). Symptoms are worst at night when attempting to lay down. Alleviating factors identifiable by patient are standing and walking. Exacerbating factors identifiable by patient are bending backwards, bending forwards and recumbency Treatments so far initiated by patient include naproxen. She states that she is allergic to Ibuprofen. Patient states that she has had several x-rays and MRIs of back since 2003.    [name] also has a history of controlled asthma. She states that she has not had an asthma exacerbation in 1 year. She states that she had several asthma exacerbations during her last pregnancy 1 year ago. Patient denies wheezing, shortness of breath, chest pains, fatigue, nausea, vomiting, or diarrhea.     Past Medical History  Diagnosis Date  . GERD (gastroesophageal reflux disease)   . Erosive esophagitis     distal esophagus, with low grade bleed 03/2009  . IBS (irritable bowel syndrome)   . Anemia   . History of migraine headaches   . Asthma   . Headache(784.0)   . Infection     UTI   Allergies  Allergen Reactions  . Ibuprofen Other (See Comments)    Ulcer flare up    Immunization History   Administered Date(s) Administered  . Tdap 01/10/2013    Review of Systems  Constitutional: Negative.  Negative for fatigue.  HENT: Negative.   Eyes: Negative.   Respiratory: Negative.   Cardiovascular: Negative.   Gastrointestinal: Negative.  Negative for nausea.  Endocrine: Negative.   Genitourinary: Negative.  Negative for decreased urine volume.  Musculoskeletal: Negative.   Skin: Negative.   Allergic/Immunologic: Positive for environmental allergies.  Neurological: Negative.   Hematological: Negative.   Psychiatric/Behavioral: Negative.  Negative for suicidal ideas and sleep disturbance.       Objective:   Physical Exam  Constitutional: She is oriented to person, place, and time. She appears well-developed and well-nourished.  HENT:  Head: Normocephalic and atraumatic.  Right Ear: Hearing, tympanic membrane, external ear and ear canal normal.  Left Ear: Hearing, tympanic membrane, external ear and ear canal normal.  Nose: Nose normal.  Mouth/Throat: Uvula is midline and oropharynx is clear and moist.  Eyes: Conjunctivae and EOM are normal. Pupils are equal, round, and reactive to light.  Neck: Normal range of motion. Neck supple. Tracheal deviation present.  Cardiovascular: Normal rate, regular rhythm, normal heart sounds and intact distal pulses.   Pulmonary/Chest: Effort normal and breath sounds normal.  Abdominal: Soft. Bowel sounds are normal.  Musculoskeletal: Normal range of motion.  Neurological: She is alert and oriented to person, place, and time. She has normal reflexes.  Skin: Skin is warm and dry.  Psychiatric: She has a normal mood and  affect. Her behavior is normal. Judgment and thought content normal.         BP 132/87 mmHg  Pulse 91  Temp(Src) 98.1 F (36.7 C) (Oral)  Resp 16  Ht 5\' 6"  (1.676 m)  Wt 202 lb (91.627 kg)  BMI 32.62 kg/m2 Assessment & Plan:     1. Chronic back pain greater than 3 months duration [name] was recently evaluated  by the emergency department for back pain. She states that she had a fall in 2003 resulting in a slipped disc. Reviewed previous xrays from emergency room visits, patient has minor arthritic changes in L5 and S1She states that she was under the care of a chiropracter for several years with a tens unit and heat therapy.  She states that pain occurs intermittently. When exacerbation of back pain occurs, she typically has to stay out of work.  Will send referral to orthopedics and sports medicine. [name] states that she will find the names of previous providers, so that we can review medical records. Patient relocated to the area several years ago.   - acetaminophen (TYLENOL) 500 MG tablet; Take 1 tablet (500 mg total) by mouth every 6 (six) hours as needed for mild pain or moderate pain.  Dispense: 30 tablet; Refill: 2 - Ambulatory referral to Sports Medicine - Vitamin D, 25-hydroxy  2. Asthma, mild intermittent, well-controlled Patient has not had an allergy flare since 2 years ago.   3. Seasonal allergies Patient states that she occasionally takes fluticasone for allergy flares.   4. Metabolic syndrome  - COMPLETE METABOLIC PANEL WITH GFR - CBC with Differential - TSH - Lipid Panel - POCT urinalysis dipstick   5. Elevated blood pressure (not hypertension) Patient denies previous diagnosis of high blood pressure. She reports that her mother has a history of high blood pressure and is currently on medications. She states that she had pre-eclampsia during pregnancy, but blood pressure normalized following pregnancy. Will continue to monitor blood pressure.   Preventative care:  Last pap smear: May 2014; will need pap smear in 2017 Monthly self breast exam after per Last menstrual period was January of 2015. Patient is currently on depo provera-Patient is currently followed at the [facility] Patient has been pregnant 3 times and has 3 live births Patient is a non smoker and  occasionally drinks alcohol  RTC: Complete physical examination in 6 months Return for fasting labs 1 week prior to appointment  [name], FNP

## 2014-12-15 ENCOUNTER — Telehealth: Payer: Self-pay | Admitting: Family Medicine

## 2014-12-15 DIAGNOSIS — E559 Vitamin D deficiency, unspecified: Secondary | ICD-10-CM

## 2014-12-15 LAB — VITAMIN D 25 HYDROXY (VIT D DEFICIENCY, FRACTURES): VIT D 25 HYDROXY: 10 ng/mL — AB (ref 30–100)

## 2014-12-15 MED ORDER — ERGOCALCIFEROL 1.25 MG (50000 UT) PO CAPS: 50000.0000 [IU] | ORAL_CAPSULE | ORAL | Status: DC

## 2014-12-15 NOTE — Telephone Encounter (Signed)
Ms. Elvina Ran, a 34 year old patient with a history of low back pain was evaluated in the office on 12/14/2014. Reviewed labs, patient found to have vitamin D deficiency. Will start weekly vitamin D and re-check vitamin D level in 3 months.  Meds ordered this encounter  Medications  . ergocalciferol (VITAMIN D2) 50000 UNITS capsule    Sig: Take 1 capsule (50,000 Units total) by mouth once a week.    Dispense:  4 capsule    Refill:  3    Order Specific Question:  Supervising Provider    Answer:  Marthann Schiller A [[id number]]    [name], FNP

## 2014-12-16 NOTE — Telephone Encounter (Signed)
Called and spoke with patient went over vitamin D levels and starting medication regiment. Patient verbalized understanding and instructions. Thanks!

## 2015-01-26 ENCOUNTER — Emergency Department (HOSPITAL_COMMUNITY)
Admission: EM | Admit: 2015-01-26 | Discharge: 2015-01-26 | Disposition: A | Discharge status: Home / Self Care | Payer: Medicaid Other | Attending: Emergency Medicine | Admitting: Emergency Medicine

## 2015-01-26 ENCOUNTER — Emergency Department (HOSPITAL_COMMUNITY): Payer: Medicaid Other

## 2015-01-26 ENCOUNTER — Encounter (HOSPITAL_COMMUNITY): Payer: Self-pay | Admitting: Vascular Surgery

## 2015-01-26 DIAGNOSIS — Y998 Other external cause status: Secondary | ICD-10-CM | POA: Insufficient documentation

## 2015-01-26 DIAGNOSIS — S92531A Displaced fracture of distal phalanx of right lesser toe(s), initial encounter for closed fracture: Secondary | ICD-10-CM | POA: Insufficient documentation

## 2015-01-26 DIAGNOSIS — G43909 Migraine, unspecified, not intractable, without status migrainosus: Secondary | ICD-10-CM | POA: Diagnosis not present

## 2015-01-26 DIAGNOSIS — J45909 Unspecified asthma, uncomplicated: Secondary | ICD-10-CM | POA: Insufficient documentation

## 2015-01-26 DIAGNOSIS — Y9289 Other specified places as the place of occurrence of the external cause: Secondary | ICD-10-CM | POA: Insufficient documentation

## 2015-01-26 DIAGNOSIS — S92421A Displaced fracture of distal phalanx of right great toe, initial encounter for closed fracture: Secondary | ICD-10-CM | POA: Diagnosis not present

## 2015-01-26 DIAGNOSIS — Z87440 Personal history of urinary (tract) infections: Secondary | ICD-10-CM | POA: Insufficient documentation

## 2015-01-26 DIAGNOSIS — Z7951 Long term (current) use of inhaled steroids: Secondary | ICD-10-CM | POA: Diagnosis not present

## 2015-01-26 DIAGNOSIS — S92911A Unspecified fracture of right toe(s), initial encounter for closed fracture: Secondary | ICD-10-CM

## 2015-01-26 DIAGNOSIS — Z8719 Personal history of other diseases of the digestive system: Secondary | ICD-10-CM | POA: Insufficient documentation

## 2015-01-26 DIAGNOSIS — W208XXA Other cause of strike by thrown, projected or falling object, initial encounter: Secondary | ICD-10-CM | POA: Insufficient documentation

## 2015-01-26 DIAGNOSIS — S99921A Unspecified injury of right foot, initial encounter: Secondary | ICD-10-CM | POA: Diagnosis present

## 2015-01-26 DIAGNOSIS — Y9389 Activity, other specified: Secondary | ICD-10-CM | POA: Diagnosis not present

## 2015-01-26 DIAGNOSIS — Z862 Personal history of diseases of the blood and blood-forming organs and certain disorders involving the immune mechanism: Secondary | ICD-10-CM | POA: Diagnosis not present

## 2015-01-26 MED ORDER — HYDROCODONE-ACETAMINOPHEN 5-325 MG PO TABS: ORAL_TABLET | ORAL | Status: DC

## 2015-01-26 MED ORDER — HYDROCODONE-ACETAMINOPHEN 5-325 MG PO TABS
1.0000 | ORAL_TABLET | Freq: Once | ORAL | Status: AC
  Administered 2015-01-26: 1 via ORAL
  Filled 2015-01-26: qty 1

## 2015-01-26 NOTE — ED Notes (Signed)
Pt reports to the ED for eval of right great and right second toe pain. She reports that she dropped a heavy object on it today at 12 pm. Some ecchymosis noted to ventral aspect of toes. Some slight swelling noted as well. CMS intact. Pt A&Ox4, resp e/u, and skin warm and dry.

## 2015-01-26 NOTE — ED Provider Notes (Signed)
CSN: [id number]     Arrival date & time 01/26/15  1541 History  This chart was scribed for Wynetta Emery, PA-C, working with Rolan Bucco, MD by Chestine Spore, ED Scribe. The patient was seen in room TR08C/TR08C at 4:02 PM.     Chief Complaint  Patient presents with  . Toe Injury      The history is provided by the patient. No language interpreter was used.    [name] is a 34 y.o. female with no significant medical hx who presents to the Emergency Department complaining of right toe injury onset this morning. Pt notes that she was at a junkyard when she dropped a heavy object weighing approximately 200 lbs on the upper portion of her right great toe. Pt reports that her toe pain is worsened with ambulation and she rates her toe pain as 9/10 and it is throbbing and burning. Pt is having associated symptoms of mild numbness to right great toe and bruising to right great toe. She notes that she has tried ice, icy/hot patch with no medications for the relief of her symptoms. She denies rash, wound, and any other symptoms. Pt reports that she is allergic to ibuprofen.   Past Medical History  Diagnosis Date  . GERD (gastroesophageal reflux disease)   . Erosive esophagitis     distal esophagus, with low grade bleed 03/2009  . IBS (irritable bowel syndrome)   . Anemia   . History of migraine headaches   . Asthma   . Headache(784.0)   . Infection     UTI   Past Surgical History  Procedure Laterality Date  . No past surgeries     Family History  Problem Relation Age of Onset  . Migraines Mother   . Asthma Mother   . Migraines Father   . Heart disease Father   . Asthma Daughter   . Cancer Maternal Grandmother    History  Substance Use Topics  . Smoking status: Never Smoker   . Smokeless tobacco: Never Used  . Alcohol Use: Yes     Comment: occasional on holidays   OB History    Gravida Para Term Preterm AB TAB SAB Ectopic Multiple Living   3 3 3       0 3      Review of Systems  Musculoskeletal: Positive for arthralgias.  Skin: Positive for color change. Negative for rash and wound.   A complete 10 system review of systems was obtained and all systems are negative except as noted in the HPI and PMH.    Allergies  Ibuprofen  Home Medications   Prior to Admission medications   Medication Sig Start Date End Date Taking? Authorizing Provider  acetaminophen (TYLENOL) 500 MG tablet Take 1 tablet (500 mg total) by mouth every 6 (six) hours as needed for mild pain or moderate pain. 12/14/14   [name], FNP  albuterol (PROVENTIL HFA;VENTOLIN HFA) 108 (90 BASE) MCG/ACT inhaler Inhale 2 puffs into the lungs every 6 (six) hours as needed for wheezing or shortness of breath. 06/08/14   Deirdre Colin Mulders, CNM  cyclobenzaprine (FLEXERIL) 10 MG tablet Take 1 tablet (10 mg total) by mouth 2 (two) times daily as needed for muscle spasms. 11/23/14   Azalia Bilis, MD  ergocalciferol (VITAMIN D2) 50000 UNITS capsule Take 1 capsule (50,000 Units total) by mouth once a week. 12/15/14   [name], FNP  fluticasone (FLONASE) 50 MCG/ACT nasal spray Place 2 sprays into both nostrils  daily. Patient not taking: Reported on 11/23/2014 09/18/14   Rodolph Bong, MD   BP 123/72 mmHg  Pulse 91  Temp(Src) 98.1 F (36.7 C) (Oral)  Resp 16  SpO2 100% Physical Exam  Constitutional: She is oriented to person, place, and time. She appears well-developed and well-nourished. No distress.  HENT:  Head: Normocephalic and atraumatic.  Eyes: EOM are normal.  Neck: Neck supple. No tracheal deviation present.  Cardiovascular: Normal rate.   Pulmonary/Chest: Effort normal. No respiratory distress.  Musculoskeletal: Normal range of motion.       Right foot: There is tenderness. There is no deformity.  No deformity. Small ecchymosis and tenderness diffusely on right great toe. No overlying skin changes distally. NVI.  Neurological: She is alert and oriented to person, place,  and time.  Skin: Skin is warm and dry.  Psychiatric: She has a normal mood and affect. Her behavior is normal.  Nursing note and vitals reviewed.   ED Course  Procedures (including critical care time) DIAGNOSTIC STUDIES: Oxygen Saturation is 100% on RA, nl by my interpretation.    COORDINATION OF CARE: 4:05 PM-Discussed treatment plan which includes norco and X-ray of right great toe with pt at bedside and pt agreed to plan.   Labs Review Labs Reviewed - No data to display  Imaging Review Dg Toe Great Right  01/26/2015   CLINICAL DATA:  Blunt trauma to the right great toe and second toe today. Pain and swelling.  EXAM: RIGHT GREAT TOE  COMPARISON:  None.  FINDINGS: There are minimally displaced fractures of the tufts of the distal phalangeal bone of the first and second toes. There are also fractures of the base of the distal phalanx of the second toe. The other bones are intact.  IMPRESSION: Fractures of the distal phalanges of of the first and second toes as described.   Electronically Signed   By: Francene Boyers M.D.   On: 01/26/2015 16:34     EKG Interpretation None      MDM   Final diagnoses:  Toe fracture, right, closed, initial encounter    Filed Vitals:   01/26/15 1551  BP: 123/72  Pulse: 91  Temp: 98.1 F (36.7 C)  TempSrc: Oral  Resp: 16  SpO2: 100%    Medications  HYDROcodone-acetaminophen (NORCO/VICODIN) 5-325 MG per tablet 1 tablet (not administered)    [name] is a pleasant 34 y.o. female presenting with right great toe pain after a large 200 pound object dropped on most she was at a junkyard this afternoon. There is no overlying skin changes, no deformity. X-ray shows 3 fractures one on the great toe, and 2 on the second digit distal phalanx. Patient will be given a postop boot, crutches, pain control, toes will be buddy taped and podiatry referral given.   Evaluation does not show pathology that would require ongoing emergent intervention  or inpatient treatment. Pt is hemodynamically stable and mentating appropriately. Discussed findings and plan with patient/guardian, who agrees with care plan. All questions answered. Return precautions discussed and outpatient follow up given.   Discharge Medication List as of 01/26/2015  4:52 PM    START taking these medications   Details  HYDROcodone-acetaminophen (NORCO/VICODIN) 5-325 MG per tablet Take 1-2 tablets by mouth every 6 hours as needed for pain and/or cough., Print         I personally performed the services described in this documentation, which was scribed in my presence. The recorded information has been reviewed  and is accurate.    Wynetta Emery, PA-C 01/26/15 2138  Rolan Bucco, MD 01/26/15 2224

## 2015-01-26 NOTE — Discharge Instructions (Signed)
Rest, Ice intermittently (in the first 24-48 hours), Gentle compression with an Ace wrap, and elevate (Limb above the level of the heart)   Take up to  of ibuprofen (that is usually 4 over the counter pills)  3 times a day for 5 days. Take with food.  Take vicodin for breakthrough pain, do not drink alcohol, drive, care for children or do other critical tasks while taking vicodin.  Do not hesitate to return to the emergency room for any new, worsening or concerning symptoms.  Please obtain primary care using resource guide below. Let them know that you were seen in the emergency room and that they will need to obtain records for further outpatient management.   Buddy Taping of Toes We have taped your toes together to keep them from moving. This is called "buddy taping" since we used a part of your own body to keep the injured part still. We placed soft padding between your toes to keep them from rubbing against each other. Buddy taping will help with healing and to reduce pain. Keep your toes buddy taped together for as long as directed by your caregiver. HOME CARE INSTRUCTIONS   Raise your injured area above the level of your heart while sitting or lying down. Prop it up with pillows.  An ice pack used every twenty minutes, while awake, for the first one to two days may be helpful. Put ice in a plastic bag and put a towel between the bag and your skin.  Watch for signs that the taping is too tight. These signs may be:  Numbness of your taped toes.  Coolness of your taped toes.  Color change in the area beyond the tape.  Increased pain.  If you have any of these signs, loosen or rewrap the tape. If you need to loosen or rewrap the buddy tape, make sure you use the padding again. SEEK IMMEDIATE MEDICAL CARE IF:   You have worse pain, swelling, inflammation (soreness), drainage or bleeding after you rewrap the tape.  Any new problems occur. MAKE SURE YOU:   Understand these  instructions.  Will watch your condition.  Will get help right away if you are not doing well or get worse. Document Released: 03/22/2004 Document Revised: 09/10/2011 Document Reviewed: 06/15/2008 [facility] Patient Information 2015 [place], [place]. This information is not intended to replace advice given to you by your health care provider. Make sure you discuss any questions you have with your health care provider.   Emergency Department Resource Guide 1) Find a Doctor and Pay Out of Pocket Although you won't have to find out who is covered by your insurance plan, it is a good idea to ask around and get recommendations. You will then need to call the office and see if the doctor you have chosen will accept you as a new patient and what types of options they offer for patients who are self-pay. Some doctors offer discounts or will set up payment plans for their patients who do not have insurance, but you will need to ask so you aren't surprised when you get to your appointment.  2) Contact Your Local Health Department Not all health departments have doctors that can see patients for sick visits, but many do, so it is worth a call to see if yours does. If you don't know where your local health department is, you can check in your phone book. The CDC also has a tool to help you locate your state's health department,  and many state websites also have listings of all of their local health departments.  3) Find a Walk-in Clinic If your illness is not likely to be very severe or complicated, you may want to try a walk in clinic. These are popping up all over the country in pharmacies, drugstores, and shopping centers. They're usually staffed by nurse practitioners or physician assistants that have been trained to treat common illnesses and complaints. They're usually fairly quick and inexpensive. However, if you have serious medical issues or chronic medical problems, these are probably not your best  option.  No Primary Care Doctor: - Call Health Connect at  [phone] - they can help you locate a primary care doctor that  accepts your insurance, provides certain services, etc. - Physician Referral Service- [phone]  Chronic Pain Problems: Organization         Address  Phone   Notes  [facility]  [phone] Patients need to be referred by their primary care doctor.   Medication Assistance: Organization         Address  Phone   Notes  [facility] Medication [facility] [address] [place], [place] [address] [phone] --Must be a resident of [facility] -- Must have NO insurance coverage whatsoever (no Medicaid/ Medicare, etc.) -- The pt. MUST have a primary care doctor that directs their care regularly and follows them in the community   Med[facility]ist  [phone]   [employer]  [phone]    Agencies that provide inexpensive medical care: Organization         Address  Phone   Notes  [facility]  [phone]   [facility]    [phone]   [facility] [address] [place], [place] [address] [phone]   [facility] [address] [place] [place] [phone]   Planned Parenthood    [phone]   [facility]    [phone]   [facility]  [address], [place] Phone:  [phone], Fax:  [phone] Hours of Operation:  9 am - 6 pm, M-F.  Also accepts Medicaid/Medicare and self-pay.  [facility]  [address], [place] Phone: [phone], Fax: [phone]. Hours of Operation:  8:30 am - 5:30 pm, M-F.  Also accepts Medicaid and self-pay.  [facility] [place] [address] [place] Phone: [phone]   [facility] [address] Natasha Bence [place], [place] [phone], Ext. [phone] Mondays & Thursdays: 7-9 AM.  First 15  patients are seen on a first come, first serve basis.    Medicaid-accepting [facility] Providers:  Organization         Address  Phone   Notes  [facility] [address], [place] [phone] Also accepts self-pay patients.  [facility] [address] Laurell Josephs [place], [place]  [phone]   [facility] [address], [place] [phone]   [facility] [address], [place] [phone]   Renaye Rakers [address], [place]   [phone] Only accepts [place] Access [place] patients after they have their name applied to their card.   Self-Pay (no insurance) in [facility]:  Organization  Address  Phone   Notes  Sickle Cell Patients, [facility] [place] [phone]   [facility] [place] [phone]   [facility] [place]  [place],  [phone]   [facility]/Dr. Osei-Bonsu  [address], [place] or [address], [place] [phone] Phone number for both [place] and [place] locations is the same.  [facility] [address] [place] [phone]   [facility] [address], [place] or [address] [phone]   [facility] [address], [place] [phone], phone; [phone], fax Sees patients 1st and 3rd Saturday of every month.  Must not qualify for public or private insurance (i.e. Medicaid, Medicare, [place] Health Choice, Veterans' Benefits)  Household income should be no more than 200% of the poverty level The clinic cannot treat you if you are pregnant or think you are pregnant  Sexually transmitted diseases are not treated at the clinic.    Dental  Care: Organization         Address  Phone  Notes  [facility] Department of [facility] [place] [phone] Accepts children up to age 64 who are enrolled in [place] or [place]; pregnant women with a Medicaid card; and children who have applied for Medicaid or [place] Health Choice, but were declined, whose parents can pay a reduced fee at time of service.  [facility] Department of [facility]  [address], [place] [phone] Accepts children up to age 72 who are enrolled in [place] or [place]; pregnant women with a Medicaid card; and children who have applied for Medicaid or [place] Health Choice, but were declined, whose parents can pay a reduced fee at time of service.  [place] Adult [facility] Access PROGRAM  [place] [phone] Patients are seen by appointment only. Walk-ins are not accepted. [place] will see patients 71 years of age and older. Monday - Tuesday (8am-5pm) Most Wednesdays (8:30-5pm) $30 per visit, cash only  [facility] Adult Dental Access PROGRAM  [address], [facility] [phone] Patients are seen by appointment only. Walk-ins are not accepted. [place] will see patients 57 years of age and older. One Wednesday Evening (Monthly: Volunteer Based).  $30 per visit, cash only  [employer]  [phone] for adults; Children under age 30, call Graduate Pediatric Dentistry at [phone]. Children aged 43-14, please call [phone] to request a pediatric application.  Dental services are provided in all areas of dental care including fillings, crowns and bridges, complete and partial dentures, implants, gum treatment, root canals, and extractions. Preventive care is also provided. Treatment is provided to both adults and children. Patients are selected via a lottery and there is often a waiting list.   [facility] [address] [place]  [phone] [email or web address]   [facility] [address] [place], [place] [phone], Ext. [phone] Second and Fourth Thursday of each month, opens at 6:30 AM; Clinic ends at 9 AM.  Patients are seen on a first-come first-served basis, and a limited number are seen during each clinic.   [facility]  [address] [place], [place]  [place], [place] [phone]   Eligibility Requirements You must have lived in [place], [place], or [place] counties for at least the last three months.   You cannot be eligible for state or federal sponsored [employer], including [employer], [place], or [employer].   You generally cannot be eligible for healthcare insurance through your employer.    How to apply: Eligibility screenings are held every Tuesday and Wednesday afternoon from 1:00 pm until 4:00 pm. You do not need an appointment for the interview!  [facility] [address], [place], [place]   [place]  [place] Department  [place]  [phone]    Behavioral Health Resources in the Community: Intensive Outpatient Programs Organization         Address  Phone  Notes  Newtonia Grant. [address] [place], [place] [phone]   [facility] [address], [place], [place]   [facility] [address] [place], [place]   [place] [address],  [place], [place] or [phone]   Substance Abuse Resources Organization         Address  Phone  Notes  [facility] Drug Services  [phone]   [place]  [phone]   [facility] [place]   Chinita Pester  [phone]   Residential & Outpatient Substance Abuse Program  [phone]    Psychological Services Organization         Address  Phone  Notes  [facility] [place]  [place]  [phone]   [place] [address], [place] or [phone]    Mobile Crisis Teams Organization         Address  Phone  Notes  [facility], Mobile Crisis Care Unit  [phone]   [facility]  [address] [place], [place]   [place] Levels [address] [place] [phone]    Self-Help/Support Groups Organization         Address  Phone             Notes  [place]. of [place] - variety of support groups  [place] Call for more information  Narcotics Anonymous (NA), [facility] [address], [employer] [place]  2 meetings at this location   Special educational needs teacher         Address  Phone  Notes  [facility] Residential Treatment [place],    [place]  [phone]   [facility]  [address] [place] [address]35[address], [place], [place]   [place], [place] [phone] Admissions: 8am-3pm M-F  Incentives Substance [place] [address]    [place], [place] [phone]   [facility] [address] Jadene Pierini [place], [place]   [facility] [address],  [place], [place]   [facility] - Intensive Outpatient [address] Krist[address], [place], [place]   [facility] [place].,  [place], [place] or [phone]   [facility] ([facility]) [address], [place], [place] Accepts Medicaid  [facility] [place] [address],  [place] [phone] Substance Abuse/Addiction Treatment   [facility] Resources Organization  Address  Phone  Notes  °[facility]  [phone]   °Julie Brannon, PhD [address] [place], [place]   ([phone] or (336) [phone]   °[facility]   [address] °[place], [place] [phone]   °[facility] [address], [place], [place] [phone] Insurance/Medicaid/sponsorship through [facility]  °[facility] [address]                                    [place], [place] [phone] Therapy/tele-psych/case  °[facility] [address]  ° [place], [place] [phone]    °Dr. Arfeen  [phone]   °[facility] [place]  [employer] [place] [facility] 1) [address], [place] °2) [address], [place] °3)  [address], [place] [phone] °([phone]) [phone] ° °([phone]) [phone]   °[place] Child Abuse Hotline ([phone]) [phone] or (336) [phone] (After Hours)    ° ° ° °

## 2015-02-16 ENCOUNTER — Ambulatory Visit: Payer: Medicaid Other

## 2015-02-17 ENCOUNTER — Ambulatory Visit (INDEPENDENT_AMBULATORY_CARE_PROVIDER_SITE_OTHER): Payer: Self-pay

## 2015-02-17 VITALS — BP 134/79 | HR 70 | Temp 98.3°F | Wt 199.5 lb

## 2015-02-17 DIAGNOSIS — Z3042 Encounter for surveillance of injectable contraceptive: Secondary | ICD-10-CM | POA: Insufficient documentation

## 2015-02-17 MED ORDER — MEDROXYPROGESTERONE ACETATE 150 MG/ML IM SUSP
150.0000 mg | Freq: Once | INTRAMUSCULAR | Status: AC
  Administered 2015-02-17: 150 mg via INTRAMUSCULAR

## 2015-03-03 ENCOUNTER — Emergency Department (HOSPITAL_COMMUNITY)
Admission: EM | Admit: 2015-03-03 | Discharge: 2015-03-03 | Disposition: A | Discharge status: Home / Self Care | Payer: Self-pay | Attending: Emergency Medicine | Admitting: Emergency Medicine

## 2015-03-03 ENCOUNTER — Encounter (HOSPITAL_COMMUNITY): Payer: Self-pay | Admitting: *Deleted

## 2015-03-03 DIAGNOSIS — Z87440 Personal history of urinary (tract) infections: Secondary | ICD-10-CM | POA: Insufficient documentation

## 2015-03-03 DIAGNOSIS — Z862 Personal history of diseases of the blood and blood-forming organs and certain disorders involving the immune mechanism: Secondary | ICD-10-CM | POA: Insufficient documentation

## 2015-03-03 DIAGNOSIS — Z8719 Personal history of other diseases of the digestive system: Secondary | ICD-10-CM | POA: Insufficient documentation

## 2015-03-03 DIAGNOSIS — G8929 Other chronic pain: Secondary | ICD-10-CM | POA: Insufficient documentation

## 2015-03-03 DIAGNOSIS — M549 Dorsalgia, unspecified: Secondary | ICD-10-CM

## 2015-03-03 DIAGNOSIS — J45909 Unspecified asthma, uncomplicated: Secondary | ICD-10-CM | POA: Insufficient documentation

## 2015-03-03 DIAGNOSIS — Z8679 Personal history of other diseases of the circulatory system: Secondary | ICD-10-CM | POA: Insufficient documentation

## 2015-03-03 DIAGNOSIS — M545 Low back pain: Secondary | ICD-10-CM | POA: Insufficient documentation

## 2015-03-03 MED ORDER — DEXAMETHASONE 4 MG PO TABS
8.0000 mg | ORAL_TABLET | Freq: Once | ORAL | Status: AC
  Administered 2015-03-03: 8 mg via ORAL
  Filled 2015-03-03: qty 2

## 2015-03-03 MED ORDER — ACETAMINOPHEN 500 MG PO TABS
1000.0000 mg | ORAL_TABLET | Freq: Once | ORAL | Status: DC
  Filled 2015-03-03: qty 2

## 2015-03-03 MED ORDER — ACETAMINOPHEN 325 MG PO TABS
650.0000 mg | ORAL_TABLET | Freq: Once | ORAL | Status: AC
  Administered 2015-03-03: 650 mg via ORAL

## 2015-03-03 MED ORDER — CYCLOBENZAPRINE HCL 10 MG PO TABS: 10.0000 mg | ORAL_TABLET | Freq: Three times a day (TID) | ORAL | Status: DC | PRN

## 2015-03-03 MED ORDER — OXYCODONE-ACETAMINOPHEN 5-325 MG PO TABS
1.0000 | ORAL_TABLET | Freq: Once | ORAL | Status: AC
  Administered 2015-03-03: 1 via ORAL
  Filled 2015-03-03: qty 1

## 2015-03-03 MED ORDER — ACETAMINOPHEN 500 MG PO TABS: 500.0000 mg | ORAL_TABLET | Freq: Four times a day (QID) | ORAL | Status: DC | PRN

## 2015-03-03 NOTE — ED Notes (Signed)
Patient presents with c/o back pain.  History of slipped disc

## 2015-03-03 NOTE — Discharge Instructions (Signed)

## 2015-03-03 NOTE — ED Provider Notes (Signed)
CSN: [id number]     Arrival date & time 03/03/15  0418 History   First MD Initiated Contact with Patient 03/03/15 [phone]     Chief Complaint  Patient presents with  . Back Pain      HPI Patient presents emergency department complaining of low back pain.  She has a long-standing history of chronic low back pain.  She tried anti-inflammatories at home without improvement in her symptoms.  She denies abdominal pain.  No vaginal bleeding.  No dysuria or urinary frequency.  She denies nausea vomiting.  She states this feels like her prior back pain.  Her pain is moderate to severe in severity at this time.  She believes this is secondary to sleeping in the hospital with her son who was recently hospitalized.   Past Medical History  Diagnosis Date  . GERD (gastroesophageal reflux disease)   . Erosive esophagitis     distal esophagus, with low grade bleed 03/2009  . IBS (irritable bowel syndrome)   . Anemia   . History of migraine headaches   . Asthma   . Headache(784.0)   . Infection     UTI   Past Surgical History  Procedure Laterality Date  . No past surgeries     Family History  Problem Relation Age of Onset  . Migraines Mother   . Asthma Mother   . Migraines Father   . Heart disease Father   . Asthma Daughter   . Cancer Maternal Grandmother    Social History  Substance Use Topics  . Smoking status: Never Smoker   . Smokeless tobacco: Never Used  . Alcohol Use: Yes     Comment: occasional on holidays   OB History    Gravida Para Term Preterm AB TAB SAB Ectopic Multiple Living   3 3 3       0 3     Review of Systems  All other systems reviewed and are negative.     Allergies  Ibuprofen  Home Medications   Prior to Admission medications   Medication Sig Start Date End Date Taking? Authorizing Provider  acetaminophen (TYLENOL) 500 MG tablet Take 1 tablet (500 mg total) by mouth every 6 (six) hours as needed for mild pain or moderate pain. Patient not taking:  Reported on 03/03/2015 12/14/14   [name], FNP  albuterol (PROVENTIL HFA;VENTOLIN HFA) 108 (90 BASE) MCG/ACT inhaler Inhale 2 puffs into the lungs every 6 (six) hours as needed for wheezing or shortness of breath. Patient not taking: Reported on 03/03/2015 06/08/14   Deirdre C Poe, CNM  cyclobenzaprine (FLEXERIL) 10 MG tablet Take 1 tablet (10 mg total) by mouth 2 (two) times daily as needed for muscle spasms. Patient not taking: Reported on 03/03/2015 11/23/14   Azalia Bilis, MD  ergocalciferol (VITAMIN D2) 50000 UNITS capsule Take 1 capsule (50,000 Units total) by mouth once a week. Patient not taking: Reported on 03/03/2015 12/15/14   [name], FNP  fluticasone [facility]) 50 MCG/ACT nasal spray Place 2 sprays into both nostrils daily. Patient not taking: Reported on 11/23/2014 09/18/14   Rodolph Bong, MD  HYDROcodone-acetaminophen (NORCO/VICODIN) 5-325 MG per tablet Take 1-2 tablets by mouth every 6 hours as needed for pain and/or cough. Patient not taking: Reported on 03/03/2015 01/26/15   Joni Reining Pisciotta, PA-C   BP 126/74 mmHg  Pulse 70  Temp(Src) 98.3 F (36.8 C) (Oral)  Resp 16  Ht 5\' 5"  (1.651 m)  Wt 155 lb (70.308 kg)  BMI 25.79 kg/m2  SpO2 100% Physical Exam  Constitutional: She is oriented to person, place, and time. She appears well-developed and well-nourished. No distress.  HENT:  Head: Normocephalic and atraumatic.  Eyes: EOM are normal.  Neck: Normal range of motion.  Cardiovascular: Normal rate, regular rhythm and normal heart sounds.   Pulmonary/Chest: Effort normal and breath sounds normal.  Abdominal: Soft. She exhibits no distension. There is no tenderness.  Musculoskeletal: Normal range of motion.  5 out of 5 strength of bilateral lower extremity major muscle groups.  No thoracic or lumbar point tenderness.  Mild para lumbar tenderness with possible spasm on the left  Neurological: She is alert and oriented to person, place, and time.  Skin: Skin is warm and  dry.  Psychiatric: She has a normal mood and affect. Judgment normal.  Nursing note and vitals reviewed.   ED Course  Procedures (including critical care time) Labs Review Labs Reviewed - No data to display  Imaging Review No results found. I have personally reviewed and evaluated these images and lab results as part of my medical decision-making.   EKG Interpretation None      MDM   Final diagnoses:  None    Normal lower extremity neurologic exam. No bowel or bladder complaints. No back pain red flags. Likely musculoskeletal back pain. Doubt spinal epidural abscess. Doubt cauda equina. Doubt abdominal aortic aneurysm     Azalia Bilis, MD 03/03/15 [phone]

## 2015-04-02 ENCOUNTER — Inpatient Hospital Stay (HOSPITAL_COMMUNITY)
Admission: AD | Admit: 2015-04-02 | Discharge: 2015-04-02 | Disposition: A | Discharge status: Home / Self Care | Payer: Self-pay | Source: Ambulatory Visit | Attending: Family Medicine | Admitting: Family Medicine

## 2015-04-02 ENCOUNTER — Encounter (HOSPITAL_COMMUNITY): Payer: Self-pay | Admitting: *Deleted

## 2015-04-02 DIAGNOSIS — K219 Gastro-esophageal reflux disease without esophagitis: Secondary | ICD-10-CM | POA: Insufficient documentation

## 2015-04-02 DIAGNOSIS — N939 Abnormal uterine and vaginal bleeding, unspecified: Secondary | ICD-10-CM | POA: Insufficient documentation

## 2015-04-02 DIAGNOSIS — R300 Dysuria: Secondary | ICD-10-CM | POA: Insufficient documentation

## 2015-04-02 LAB — URINALYSIS, ROUTINE W REFLEX MICROSCOPIC
Bilirubin Urine: NEGATIVE
Glucose, UA: NEGATIVE mg/dL
KETONES UR: NEGATIVE mg/dL
Leukocytes, UA: NEGATIVE
Nitrite: NEGATIVE
Protein, ur: NEGATIVE mg/dL
Specific Gravity, Urine: >1.03 — ABNORMAL HIGH (ref 1.005–1.030)
Urobilinogen, UA: 0.2 mg/dL (ref 0.0–1.0)
pH: 6.5 (ref 5.0–8.0)

## 2015-04-02 LAB — WET PREP, GENITAL
CLUE CELLS WET PREP: NONE SEEN
Trich, Wet Prep: NONE SEEN
Yeast Wet Prep HPF POC: NONE SEEN

## 2015-04-02 LAB — POCT PREGNANCY, URINE: PREG TEST UR: NEGATIVE

## 2015-04-02 LAB — URINE MICROSCOPIC-ADD ON

## 2015-04-02 MED ORDER — SULFAMETHOXAZOLE-TRIMETHOPRIM 800-160 MG PO TABS: 1.0000 | ORAL_TABLET | Freq: Two times a day (BID) | ORAL | Status: AC

## 2015-04-02 NOTE — MAU Provider Note (Signed)
History     CSN: [id number]  Arrival date and time: 04/02/15 0423   None     Chief Complaint  Patient presents with  . Vaginal Bleeding   HPI   [name] is a 34 y.o. female [phone], non pregnant presenting with dysuria and spotting; the discomfort started 7 days ago, symptoms worsened 2 days ago after intercourse. She has also noted some spotting. She is using depo for birth control; last injection was 2 months ago.   She had these symptoms in the past and was told she had vaginal dryness.  She was instructed to use vaginal lubrication, however has not tried this.    OB History    Gravida Para Term Preterm AB TAB SAB Ectopic Multiple Living   0 3      Past Medical History  Diagnosis Date  . GERD (gastroesophageal reflux disease)   . Erosive esophagitis     distal esophagus, with low grade bleed 03/2009  . IBS (irritable bowel syndrome)   . Anemia   . History of migraine headaches   . Asthma   . Headache(784.0)   . Infection     UTI    Past Surgical History  Procedure Laterality Date  . No past surgeries      Family History  Problem Relation Age of Onset  . Migraines Mother   . Asthma Mother   . Migraines Father   . Heart disease Father   . Asthma Daughter   . Cancer Maternal Grandmother     Social History  Substance Use Topics  . Smoking status: Never Smoker   . Smokeless tobacco: Never Used  . Alcohol Use: Yes     Comment: occasional on holidays    Allergies:  Allergies  Allergen Reactions  . Ibuprofen Other (See Comments)    Ulcer flare up     Prescriptions prior to admission  Medication Sig Dispense Refill Last Dose  . acetaminophen (TYLENOL) 500 MG tablet Take 1 tablet (500 mg total) by mouth every 6 (six) hours as needed. 15 tablet 0   . cyclobenzaprine (FLEXERIL) 10 MG tablet Take 1 tablet (10 mg total) by mouth 3 (three) times daily as needed for muscle spasms. 12 tablet 0     Results for orders placed or  performed during the hospital encounter of 04/02/15 (from the past 72 hour(s))  Urinalysis, Routine w reflex microscopic (not at [facility])     Status: Abnormal   Collection Time: 04/02/15  4:25 AM  Result Value Ref Range   Color, Urine YELLOW YELLOW   APPearance CLEAR CLEAR   Specific Gravity, Urine >1.030 (H) 1.005 - 1.030   pH 6.5 5.0 - 8.0   Glucose, UA NEGATIVE NEGATIVE mg/dL   Hgb urine dipstick MODERATE (A) NEGATIVE   Bilirubin Urine NEGATIVE NEGATIVE   Ketones, ur NEGATIVE NEGATIVE mg/dL   Protein, ur NEGATIVE NEGATIVE mg/dL   Urobilinogen, UA 0.2 0.0 - 1.0 mg/dL   Nitrite NEGATIVE NEGATIVE   Leukocytes, UA NEGATIVE NEGATIVE  Urine microscopic-add on     Status: Abnormal   Collection Time: 04/02/15  4:25 AM  Result Value Ref Range   Squamous Epithelial / LPF RARE RARE   WBC, UA 0-2 <3 WBC/hpf   RBC / HPF 7-10 <3 RBC/hpf   Bacteria, UA FEW (A) RARE  Pregnancy, urine POC     Status: None   Collection Time: 04/02/15  4:36 AM  Result Value  Ref Range   Preg Test, Ur NEGATIVE NEGATIVE    Comment:        THE SENSITIVITY OF THIS METHODOLOGY IS >24 mIU/mL   Wet prep, genital     Status: Abnormal   Collection Time: 04/02/15  5:00 AM  Result Value Ref Range   Yeast Wet Prep HPF POC NONE SEEN NONE SEEN   Trich, Wet Prep NONE SEEN NONE SEEN   Clue Cells Wet Prep HPF POC NONE SEEN NONE SEEN   WBC, Wet Prep HPF POC MODERATE (A) NONE SEEN    Comment: FEW BACTERIA SEEN     Review of Systems  Genitourinary: Positive for dysuria. Negative for urgency, frequency and hematuria.   Physical Exam   Blood pressure 137/93, pulse 70, temperature 98.4 F (36.9 C), temperature source Oral, resp. rate 18, height  (1.676 m), weight 91.173 kg (201 lb), not currently breastfeeding.  Physical Exam  Constitutional: She is oriented to person, place, and time. She appears well-developed and well-nourished. No distress.  HENT:  Head: Normocephalic.  Eyes: Pupils are equal, round, and reactive  to light.  Neck: Neck supple.  GI: Soft. There is no CVA tenderness.  Genitourinary:  Wet prep and GC collected without the speculum  Small amount of pink blood noted on the swab.   Musculoskeletal: Normal range of motion.  Neurological: She is alert and oriented to person, place, and time.  Skin: Skin is warm. She is not diaphoretic.  Psychiatric: Her behavior is normal.    MAU Course  Procedures  None  MDM   Assessment and Plan   A:  1. Dysuria    P:  Discharge home in stable condition RX: Bactrim Follow up with GYN as needed Condoms always   Duane Lope, NP 04/02/2015 4:57 AM

## 2015-04-02 NOTE — Discharge Instructions (Signed)

## 2015-04-02 NOTE — MAU Note (Signed)
Pt states that 2 weeks ago she had some minor vaginal discomfort and treated herself with monistat. Symptoms subsided.Marland KitchenMarland KitchenShe had intercourse on Saturday and has had some bleeding and dysuria since then that has progressively gotten worse.

## 2015-04-02 NOTE — Progress Notes (Signed)
Venia Carbon NP in to see pt and discuss test results. Written and verbal d/c instructions given and understanding voiced

## 2015-04-04 LAB — GC/CHLAMYDIA PROBE AMP (~~LOC~~) NOT AT ARMC
Chlamydia: NEGATIVE
Neisseria Gonorrhea: NEGATIVE

## 2015-05-05 ENCOUNTER — Ambulatory Visit: Payer: 59

## 2015-06-01 ENCOUNTER — Ambulatory Visit: Payer: Self-pay

## 2015-06-16 ENCOUNTER — Encounter: Payer: Self-pay | Admitting: Family Medicine
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
## 2015-08-09 ENCOUNTER — Encounter (HOSPITAL_COMMUNITY): Payer: Self-pay | Admitting: Emergency Medicine

## 2015-08-09 ENCOUNTER — Emergency Department (HOSPITAL_COMMUNITY)
Admission: EM | Admit: 2015-08-09 | Discharge: 2015-08-09 | Disposition: A | Discharge status: Home / Self Care | Payer: 59 | Attending: Emergency Medicine | Admitting: Emergency Medicine

## 2015-08-09 DIAGNOSIS — M545 Low back pain: Secondary | ICD-10-CM | POA: Insufficient documentation

## 2015-08-09 DIAGNOSIS — Z8719 Personal history of other diseases of the digestive system: Secondary | ICD-10-CM | POA: Insufficient documentation

## 2015-08-09 DIAGNOSIS — G43909 Migraine, unspecified, not intractable, without status migrainosus: Secondary | ICD-10-CM | POA: Insufficient documentation

## 2015-08-09 DIAGNOSIS — J45909 Unspecified asthma, uncomplicated: Secondary | ICD-10-CM | POA: Insufficient documentation

## 2015-08-09 DIAGNOSIS — Z862 Personal history of diseases of the blood and blood-forming organs and certain disorders involving the immune mechanism: Secondary | ICD-10-CM | POA: Insufficient documentation

## 2015-08-09 DIAGNOSIS — Z87440 Personal history of urinary (tract) infections: Secondary | ICD-10-CM | POA: Insufficient documentation

## 2015-08-09 DIAGNOSIS — M549 Dorsalgia, unspecified: Secondary | ICD-10-CM

## 2015-08-09 DIAGNOSIS — G8929 Other chronic pain: Secondary | ICD-10-CM | POA: Insufficient documentation

## 2015-08-09 MED ORDER — KETOROLAC TROMETHAMINE 60 MG/2ML IM SOLN
60.0000 mg | Freq: Once | INTRAMUSCULAR | Status: AC
  Administered 2015-08-09: 60 mg via INTRAMUSCULAR
  Filled 2015-08-09: qty 2

## 2015-08-09 MED ORDER — DIAZEPAM 5 MG PO TABS
5.0000 mg | ORAL_TABLET | Freq: Once | ORAL | Status: AC
  Administered 2015-08-09: 5 mg via ORAL
  Filled 2015-08-09: qty 1

## 2015-08-09 MED ORDER — OXYCODONE HCL 5 MG PO TABS
5.0000 mg | ORAL_TABLET | Freq: Once | ORAL | Status: AC
Start: 2015-08-09 — End: 2015-08-09
  Administered 2015-08-09: 5 mg via ORAL
  Filled 2015-08-09: qty 1

## 2015-08-09 NOTE — Discharge Instructions (Signed)
Take 4 over the counter ibuprofen tablets 3 times a day or 2 over-the-counter naproxen tablets twice a day for pain. ° °Back Pain, Adult °Back pain is very common. The pain often gets better over time. The cause of back pain is usually not dangerous. Most people can learn to manage their back pain on their own.  °HOME CARE  °Watch your back pain for any changes. The following actions may help to lessen any pain you are feeling: °· Stay active. Start with short walks on flat ground if you can. Try to walk farther each day. °· Exercise regularly as told by your doctor. Exercise helps your back heal faster. It also helps avoid future injury by keeping your muscles strong and flexible. °· Do not sit, drive, or stand in one place for more than 30 minutes. °· Do not stay in bed. Resting more than 1-2 days can slow down your recovery. °· Be careful when you bend or lift an object. Use good form when lifting: °¨ Bend at your knees. °¨ Keep the object close to your body. °¨ Do not twist. °· Sleep on a firm mattress. Lie on your side, and bend your knees. If you lie on your back, put a pillow under your knees. °· Take medicines only as told by your doctor. °· Put ice on the injured area. °¨ Put ice in a plastic bag. °¨ Place a towel between your skin and the bag. °¨ Leave the ice on for 20 minutes, 2-3 times a day for the first 2-3 days. After that, you can switch between ice and heat packs. °· Avoid feeling anxious or stressed. Find good ways to deal with stress, such as exercise. °· Maintain a healthy weight. Extra weight puts stress on your back. °GET HELP IF:  °· You have pain that does not go away with rest or medicine. °· You have worsening pain that goes down into your legs or buttocks. °· You have pain that does not get better in one week. °· You have pain at night. °· You lose weight. °· You have a fever or chills. °GET HELP RIGHT AWAY IF:  °· You cannot control when you poop (bowel movement) or pee  (urinate). °· Your arms or legs feel weak. °· Your arms or legs lose feeling (numbness). °· You feel sick to your stomach (nauseous) or throw up (vomit). °· You have belly (abdominal) pain. °· You feel like you may pass out (faint). °  °This information is not intended to replace advice given to you by your health care provider. Make sure you discuss any questions you have with your health care provider. °  °Document Released: 12/05/2007 Document Revised: 07/09/2014 Document Reviewed: 10/20/2013 °Elsevier Interactive Patient Education ©2016 Elsevier Inc. ° °

## 2015-08-09 NOTE — ED Provider Notes (Signed)
CSN: [id number]     Arrival date & time 08/09/15  0712 History   First MD Initiated Contact with Patient 08/09/15 [phone]     Chief Complaint  Patient presents with  . Flank Pain     (Consider location/radiation/quality/duration/timing/severity/associated sxs/prior Treatment) Patient is a 35 y.o. female presenting with back pain. The history is provided by the patient.  Back Pain Location:  Lumbar spine Quality:  Stabbing and shooting Radiates to:  R knee Pain severity:  Severe Onset quality:  Sudden Duration:  2 days Timing:  Constant Progression:  Unchanged Chronicity:  Chronic Relieved by:  Nothing Worsened by:  Movement, twisting, touching and palpation Ineffective treatments:  None tried Associated symptoms: no chest pain, no dysuria, no fever and no headaches   Risk factors comment:  Recent delivery  35 yo F with a chief complaints of right-sided low back pain. This is a chronic issue for this patient. Feels like her normal low back pain. Patient is unsure what made this worse. Had a recent child in December. Patient denies any fevers or chills denies dysuria. Patient denies loss of bowel or bladder decreased perirectal sensation. Is able to ambulate but has pain with ambulation. Denies recent trauma. Pain is sharp and shooting goes down her right leg to the knee. Denies paresthesias.  Past Medical History  Diagnosis Date  . GERD (gastroesophageal reflux disease)   . Erosive esophagitis     distal esophagus, with low grade bleed 03/2009  . IBS (irritable bowel syndrome)   . Anemia   . History of migraine headaches   . Asthma   . Headache(784.0)   . Infection     UTI   Past Surgical History  Procedure Laterality Date  . No past surgeries     Family History  Problem Relation Age of Onset  . Migraines Mother   . Asthma Mother   . Migraines Father   . Heart disease Father   . Asthma Daughter   . Cancer Maternal Grandmother    Social History  Substance Use Topics   . Smoking status: Never Smoker   . Smokeless tobacco: Never Used  . Alcohol Use: No     Comment: occasional on holidays   OB History    Gravida Para Term Preterm AB TAB SAB Ectopic Multiple Living   0 3     Review of Systems  Constitutional: Negative for fever and chills.  HENT: Negative for congestion and rhinorrhea.   Eyes: Negative for redness and visual disturbance.  Respiratory: Negative for shortness of breath and wheezing.   Cardiovascular: Negative for chest pain and palpitations.  Gastrointestinal: Negative for nausea and vomiting.  Genitourinary: Negative for dysuria and urgency.  Musculoskeletal: Positive for back pain and arthralgias. Negative for myalgias.  Skin: Negative for pallor and wound.  Neurological: Negative for dizziness and headaches.      Allergies  Ibuprofen  Home Medications   Prior to Admission medications   Medication Sig Start Date End Date Taking? Authorizing Provider  acetaminophen (TYLENOL) 500 MG tablet Take 1 tablet (500 mg total) by mouth every 6 (six) hours as needed. 03/03/15   Azalia Bilis, MD  cyclobenzaprine (FLEXERIL) 10 MG tablet Take 1 tablet (10 mg total) by mouth 3 (three) times daily as needed for muscle spasms. 03/03/15   Azalia Bilis, MD   BP 131/90 mmHg  Pulse 94  Temp(Src) 98.2 F (36.8 C) (Oral)  Resp 18  SpO2 100%  Physical Exam  Constitutional: She is oriented to person, place, and time. She appears well-developed and well-nourished. No distress.  HENT:  Head: Normocephalic and atraumatic.  Eyes: EOM are normal. Pupils are equal, round, and reactive to light.  Neck: Normal range of motion. Neck supple.  Cardiovascular: Normal rate and regular rhythm.  Exam reveals no gallop and no friction rub.   No murmur heard. Pulmonary/Chest: Effort normal. She has no wheezes. She has no rales.  Abdominal: Soft. She exhibits no distension. There is no tenderness. There is no rebound and no guarding.  Musculoskeletal:  She exhibits tenderness. She exhibits no edema.  TTP about the R SI joint, no piriformis ttp. PMS intact distally  Neurological: She is alert and oriented to person, place, and time. She has normal strength. She displays no Babinski's sign on the right side.  Reflex Scores:      Patellar reflexes are 2+ on the right side.      Achilles reflexes are 2+ on the right side. Skin: Skin is warm and dry. She is not diaphoretic.  Psychiatric: She has a normal mood and affect. Her behavior is normal.  Nursing note and vitals reviewed.   ED Course  Procedures (including critical care time) Labs Review Labs Reviewed - No data to display  Imaging Review No results found. I have personally reviewed and evaluated these images and lab results as part of my medical decision-making.   EKG Interpretation None      MDM   Final diagnoses:  Chronic back pain greater than 3 months duration    35 yo F with a chief complaint of right-sided low back pain. This chronic issue for this patient. Unchanged from her chronic back pain. Patient denies any cauda equina type symptoms. Has a normal lower extremity neuro exam. Will treat her pain. Discussed use of NSAIDs for pain. Have her follow-up with her family doctor.   8:06 AM:  I have discussed the diagnosis/risks/treatment options with the patient and believe the pt to be eligible for discharge home to follow-up with PCP. We also discussed returning to the ED immediately if new or worsening sx occur. We discussed the sx which are most concerning (e.g., sudden worsening pain, fever, inability to tolerate by mouth, cauda equina sxs) that necessitate immediate return. Medications administered to the patient during their visit and any new prescriptions provided to the patient are listed below.  Medications given during this visit Medications  ketorolac (TORADOL) injection 60 mg (60 mg Intramuscular Given 08/09/15 0758)  diazepam (VALIUM) tablet 5 mg (5 mg Oral  Given 08/09/15 0758)  oxyCODONE (Oxy IR/ROXICODONE) immediate release tablet 5 mg (5 mg Oral Given 08/09/15 0758)    New Prescriptions   No medications on file    The patient appears reasonably screen and/or stabilized for discharge and I doubt any other medical condition or other [facility] requiring further screening, evaluation, or treatment in the ED at this time prior to discharge.      Melene Plan, DO 08/09/15 [phone]

## 2015-08-09 NOTE — ED Notes (Signed)
C/o right sided flank pain with radiation to right leg X 3days, denies trauma, c/o cough but no other complaints, A/O X4 and in NAD

## 2015-10-06 ENCOUNTER — Emergency Department (HOSPITAL_COMMUNITY)
Admission: EM | Admit: 2015-10-06 | Discharge: 2015-10-07 | Disposition: A | Discharge status: Home / Self Care | Payer: Managed Care, Other (non HMO) | Attending: Emergency Medicine | Admitting: Emergency Medicine

## 2015-10-06 ENCOUNTER — Encounter (HOSPITAL_COMMUNITY): Payer: Self-pay | Admitting: Emergency Medicine

## 2015-10-06 ENCOUNTER — Emergency Department (HOSPITAL_COMMUNITY): Payer: Managed Care, Other (non HMO)

## 2015-10-06 DIAGNOSIS — K219 Gastro-esophageal reflux disease without esophagitis: Secondary | ICD-10-CM | POA: Diagnosis not present

## 2015-10-06 DIAGNOSIS — R202 Paresthesia of skin: Secondary | ICD-10-CM | POA: Diagnosis not present

## 2015-10-06 DIAGNOSIS — F17200 Nicotine dependence, unspecified, uncomplicated: Secondary | ICD-10-CM | POA: Diagnosis not present

## 2015-10-06 DIAGNOSIS — J45901 Unspecified asthma with (acute) exacerbation: Secondary | ICD-10-CM | POA: Insufficient documentation

## 2015-10-06 DIAGNOSIS — Z862 Personal history of diseases of the blood and blood-forming organs and certain disorders involving the immune mechanism: Secondary | ICD-10-CM | POA: Diagnosis not present

## 2015-10-06 DIAGNOSIS — Z8679 Personal history of other diseases of the circulatory system: Secondary | ICD-10-CM | POA: Insufficient documentation

## 2015-10-06 DIAGNOSIS — F439 Reaction to severe stress, unspecified: Secondary | ICD-10-CM | POA: Diagnosis not present

## 2015-10-06 DIAGNOSIS — Z79899 Other long term (current) drug therapy: Secondary | ICD-10-CM | POA: Insufficient documentation

## 2015-10-06 DIAGNOSIS — R079 Chest pain, unspecified: Secondary | ICD-10-CM | POA: Diagnosis present

## 2015-10-06 DIAGNOSIS — Z87440 Personal history of urinary (tract) infections: Secondary | ICD-10-CM | POA: Diagnosis not present

## 2015-10-06 LAB — CBC WITH DIFFERENTIAL/PLATELET
BASOS ABS: 0 10*3/uL (ref 0.0–0.1)
BASOS PCT: 0 %
EOS ABS: 0.2 10*3/uL (ref 0.0–0.7)
EOS PCT: 3 %
HCT: 33.4 % — ABNORMAL LOW (ref 36.0–46.0)
Hemoglobin: 11.7 g/dL — ABNORMAL LOW (ref 12.0–15.0)
Lymphocytes Relative: 49 %
Lymphs Abs: 3.8 10*3/uL (ref 0.7–4.0)
MCH: 31.5 pg (ref 26.0–34.0)
MCHC: 35 g/dL (ref 30.0–36.0)
MCV: 89.8 fL (ref 78.0–100.0)
MONO ABS: 0.5 10*3/uL (ref 0.1–1.0)
Monocytes Relative: 6 %
NEUTROS ABS: 3.3 10*3/uL (ref 1.7–7.7)
Neutrophils Relative %: 42 %
PLATELETS: 275 10*3/uL (ref 150–400)
RBC: 3.72 MIL/uL — ABNORMAL LOW (ref 3.87–5.11)
RDW: 13.8 % (ref 11.5–15.5)
WBC: 7.8 10*3/uL (ref 4.0–10.5)

## 2015-10-06 LAB — I-STAT TROPONIN, ED: Troponin i, poc: 0 ng/mL (ref 0.00–0.08)

## 2015-10-06 NOTE — ED Notes (Signed)
Pt from home states that when she was driving around [phone] she began feeling a stabbing feeling in the center of her chest that does not travel anywhere. Pt states that nothing she does changes the pain. Pt has a hx of hypertension and a family history of cardiomegaly. Pt states she feels short of breath when the pain is intense. The pain, she states, waxes and wanes.

## 2015-10-07 LAB — COMPREHENSIVE METABOLIC PANEL
ALBUMIN: 3.8 g/dL (ref 3.5–5.0)
ALT: 10 U/L — ABNORMAL LOW (ref 14–54)
ANION GAP: 6 (ref 5–15)
AST: 13 U/L — AB (ref 15–41)
Alkaline Phosphatase: 50 U/L (ref 38–126)
BUN: 7 mg/dL (ref 6–20)
CHLORIDE: 112 mmol/L — AB (ref 101–111)
CO2: 20 mmol/L — ABNORMAL LOW (ref 22–32)
Calcium: 8.5 mg/dL — ABNORMAL LOW (ref 8.9–10.3)
Creatinine, Ser: 0.62 mg/dL (ref 0.44–1.00)
GFR calc Af Amer: >60 mL/min (ref >60)
GFR calc non Af Amer: >60 mL/min (ref >60)
GLUCOSE: 96 mg/dL (ref 65–99)
POTASSIUM: 3.6 mmol/L (ref 3.5–5.1)
Sodium: 138 mmol/L (ref 135–145)
Total Bilirubin: 0.1 mg/dL — ABNORMAL LOW (ref 0.3–1.2)
Total Protein: 6.5 g/dL (ref 6.5–8.1)

## 2015-10-07 LAB — LIPASE, BLOOD: LIPASE: 25 U/L (ref 11–51)

## 2015-10-07 MED ORDER — ACETAMINOPHEN 500 MG PO TABS
1000.0000 mg | ORAL_TABLET | Freq: Once | ORAL | Status: AC
  Administered 2015-10-07: 1000 mg via ORAL
  Filled 2015-10-07: qty 2

## 2015-10-07 MED ORDER — LORAZEPAM 0.5 MG PO TABS
0.5000 mg | ORAL_TABLET | Freq: Once | ORAL | Status: AC
  Administered 2015-10-07: 0.5 mg via ORAL
  Filled 2015-10-07: qty 1

## 2015-10-07 MED ORDER — OMEPRAZOLE 20 MG PO CPDR: 20.0000 mg | DELAYED_RELEASE_CAPSULE | Freq: Every day | ORAL | Status: DC

## 2015-10-07 NOTE — Discharge Instructions (Signed)
1. Medications: omeprazole, usual home medications 2. Treatment: rest, drink plenty of fluids,  3. Follow Up: Please followup with your primary doctor in 2-3 days for discussion of your diagnoses and further evaluation after today's visit; if you do not have a primary care doctor use the resource guide provided to find one; Please return to the ER for worsening symptoms   Food Choices for Gastroesophageal Reflux Disease, Adult When you have gastroesophageal reflux disease (GERD), the foods you eat and your eating habits are very important. Choosing the right foods can help ease the discomfort of GERD. WHAT GENERAL GUIDELINES DO I NEED TO FOLLOW?  Choose fruits, vegetables, whole grains, low-fat dairy products, and low-fat meat, fish, and poultry.  Limit fats such as oils, salad dressings, butter, nuts, and avocado.  Keep a food diary to identify foods that cause symptoms.  Avoid foods that cause reflux. These may be different for different people.  Eat frequent small meals instead of three large meals each day.  Eat your meals slowly, in a relaxed setting.  Limit fried foods.  Cook foods using methods other than frying.  Avoid drinking alcohol.  Avoid drinking large amounts of liquids with your meals.  Avoid bending over or lying down until 2-3 hours after eating. WHAT FOODS ARE NOT RECOMMENDED? The following are some foods and drinks that may worsen your symptoms: Vegetables Tomatoes. Tomato juice. Tomato and spaghetti sauce. Chili peppers. Onion and garlic. Horseradish. Fruits Oranges, grapefruit, and lemon (fruit and juice). Meats High-fat meats, fish, and poultry. This includes hot dogs, ribs, ham, sausage, salami, and bacon. Dairy Whole milk and chocolate milk. Sour cream. Cream. Butter. Ice cream. Cream cheese.  Beverages Coffee and tea, with or without caffeine. Carbonated beverages or energy drinks. Condiments Hot sauce. Barbecue sauce.  Sweets/Desserts Chocolate  and cocoa. Donuts. Peppermint and spearmint. Fats and Oils High-fat foods, including [place] fries and potato chips. Other Vinegar. Strong spices, such as black pepper, white pepper, red pepper, cayenne, curry powder, cloves, ginger, and chili powder. The items listed above may not be a complete list of foods and beverages to avoid. Contact your dietitian for more information.   This information is not intended to replace advice given to you by your health care provider. Make sure you discuss any questions you have with your health care provider.   Document Released: 06/18/2005 Document Revised: 07/09/2014 Document Reviewed: 04/22/2013 Elsevier Interactive Patient Education [employer]2016 Elsevier Inc.

## 2015-10-07 NOTE — ED Provider Notes (Signed)
CSN: [id number]     Arrival date & time 10/06/15  2115 History   First MD Initiated Contact with Patient 10/06/15 2256     Chief Complaint  Patient presents with  . Chest Pain     (Consider location/radiation/quality/duration/timing/severity/associated sxs/prior Treatment) The history is provided by the patient and medical records. No language interpreter was used.     [name] is a 35 y.o. female  with a hx of GERD, erosive esophagitis, IBS, asthma presents to the Emergency Department complaining of acute, persistent, waxing and waning but never resolving central chest pain described as sharp and stabbing onset 3pm today while driving.  Pt reports pain is without radiation and rated at a 10/10.  Movement, palpation and deep inspiration makes the pain worse.  Nothing makes it better.  No treatments PTA.  Pt denies personal cardiac hx and reports a family hx of cardiomegaly. She reports the recent death of her mother which has caused her a significant amount of distress.  She reports cigar usage and occasional EtOH.  She denies recreational drug usage.  She does endorse several cups of coffee per day.  Pt reports that the event today was associated with hot sweats, hyperventilation and tingling of her hands. She denies a hx of anxiety but record review shows a recent Rx of Xanax for sleep.  She denies taking her PPI for several months.  Pt denies headache, neck pain, cough, fever, abd pain, vomiting, diarrhea, rash, vision changes, syncope, numbness, weakness, urinary symptoms.  Pt denies recent immobilization, palpitations, estrogen usage, hx of DVT, leg swelling, recent fracture or surgery.     Past Medical History  Diagnosis Date  . GERD (gastroesophageal reflux disease)   . Erosive esophagitis     distal esophagus, with low grade bleed 03/2009  . IBS (irritable bowel syndrome)   . Anemia   . History of migraine headaches   . Asthma   . Headache(784.0)   . Infection     UTI    Past Surgical History  Procedure Laterality Date  . No past surgeries     Family History  Problem Relation Age of Onset  . Migraines Mother   . Asthma Mother   . Migraines Father   . Heart disease Father   . Asthma Daughter   . Cancer Maternal Grandmother    Social History  Substance Use Topics  . Smoking status: Light Tobacco Smoker  . Smokeless tobacco: Never Used  . Alcohol Use: No     Comment: occasional on holidays   OB History    Gravida Para Term Preterm AB TAB SAB Ectopic Multiple Living   0 3     Review of Systems  Constitutional: Negative for fever, diaphoresis, appetite change, fatigue and unexpected weight change.  HENT: Negative for mouth sores.   Eyes: Negative for visual disturbance.  Respiratory: Positive for shortness of breath (hyperventilation). Negative for cough, chest tightness and wheezing.   Cardiovascular: Positive for chest pain.  Gastrointestinal: Negative for nausea, vomiting, abdominal pain, diarrhea and constipation.  Endocrine: Negative for polydipsia, polyphagia and polyuria.  Genitourinary: Negative for dysuria, urgency, frequency and hematuria.  Musculoskeletal: Negative for back pain and neck stiffness.  Skin: Negative for rash.  Allergic/Immunologic: Negative for immunocompromised state.  Neurological: Negative for syncope, light-headedness and headaches.       Paresthesias of the hands  Hematological: Does not bruise/bleed easily.  Psychiatric/Behavioral: Negative for sleep disturbance. The patient  is not nervous/anxious.       Allergies  Ibuprofen  Home Medications   Prior to Admission medications   Medication Sig Start Date End Date Taking? Authorizing Provider  ALPRAZolam (XANAX) 0.25 MG tablet Take 0.25 mg by mouth at bedtime as needed. for sleep 09/07/15  Yes Historical Provider, MD  sertraline (ZOLOFT) 50 MG tablet Take 50 mg by mouth daily.  09/07/15  Yes Historical Provider, MD  omeprazole (PRILOSEC) 20 MG  capsule Take 1 capsule (20 mg total) by mouth daily. 10/07/15   Jarell Mcewen, PA-C   BP 139/95 mmHg  Pulse 63  Temp(Src) 98.6 F (37 C) (Oral)  Resp 14  Ht 5\' 6"  (1.676 m)  Wt 81.647 kg  BMI 29.07 kg/m2  SpO2 98% Physical Exam  Constitutional: She appears well-developed and well-nourished. No distress.  Awake, alert, nontoxic appearance Patient tearful she gives history  HENT:  Head: Normocephalic and atraumatic.  Mouth/Throat: Oropharynx is clear and moist. No oropharyngeal exudate.  Eyes: Conjunctivae are normal. No scleral icterus.  Neck: Normal range of motion. Neck supple.  Cardiovascular: Normal rate, regular rhythm, normal heart sounds and intact distal pulses.   Pulmonary/Chest: Effort normal and breath sounds normal. No respiratory distress. She has no wheezes. She exhibits tenderness (sternal).  Equal chest expansion  Abdominal: Soft. Bowel sounds are normal. She exhibits no distension and no mass. There is no tenderness. There is no rebound and no guarding.  Musculoskeletal: Normal range of motion. She exhibits no edema.  No peripheral edema No calf tenderness  Neurological: She is alert.  Speech is clear and goal oriented Moves extremities without ataxia  Skin: Skin is warm and dry. No rash noted. She is not diaphoretic. No erythema.  Psychiatric: She has a normal mood and affect.  Nursing note and vitals reviewed.   ED Course  Procedures (including critical care time) Labs Review Labs Reviewed  CBC WITH DIFFERENTIAL/PLATELET - Abnormal; Notable for the following:    RBC 3.72 (*)    Hemoglobin 11.7 (*)    HCT 33.4 (*)    All other components within normal limits  COMPREHENSIVE METABOLIC PANEL - Abnormal; Notable for the following:    Chloride 112 (*)    CO2 20 (*)    Calcium 8.5 (*)    AST 13 (*)    ALT 10 (*)    Total Bilirubin 0.1 (*)    All other components within normal limits  LIPASE, BLOOD  I-STAT TROPOININ, ED    Imaging Review Dg Chest  2 View  10/06/2015  CLINICAL DATA:  Upper mid chest pain and shortness of breath. EXAM: CHEST - 2 VIEW COMPARISON:  05/22/2013 abdominal series FINDINGS: The heart size and mediastinal contours are within normal limits. There is no evidence of pulmonary edema, consolidation, pneumothorax, nodule or pleural fluid. The visualized skeletal structures are unremarkable. IMPRESSION: No active disease. Electronically Signed   By: Irish LackGlenn  Yamagata M.D.   On: 10/06/2015 22:06   I have personally reviewed and evaluated these images and lab results as part of my medical decision-making.   EKG Interpretation   Date/Time:  Thursday October 06 2015 21:30:16 EDT Ventricular Rate:  61 PR Interval:  176 QRS Duration: 87 QT Interval:  399 QTC Calculation: 402 R Axis:   78 Text Interpretation:  Sinus rhythm Unchanged Confirmed by Read DriversMOLPUS  MD, Jonny RuizJOHN  [phone]([id number]) on 10/06/2015 10:39:47 PM      MDM   Final diagnoses:  Chest pain, unspecified chest pain type  Gastroesophageal  reflux disease, esophagitis presence not specified  Stress   [name] presents with chest pain.  Patient is to be discharged with recommendation to follow up with PCP in regards to today's hospital visit. Chest pain is not likely of cardiac or pulmonary etiology d/t presentation, PERC negative, VSS, no tracheal deviation, no JVD or new murmur, RRR, breath sounds equal bilaterally, EKG without acute abnormalities, negative troponin, and negative CXR. Patient's pain treated with Ativan, Tylenol and omeprazole. She reports significant improvement in her central chest pain. Pt has been advised to return to the ED if CP becomes exertional, associated with diaphoresis or nausea, radiates to left jaw/arm, worsens or becomes concerning in any way. Pt appears reliable for follow up and is agreeable to discharge.    Dahlia Client Teghan Philbin, PA-C 10/07/15 0143  Paula Libra, MD 10/07/15 [phone]

## 2015-12-28 ENCOUNTER — Other Ambulatory Visit: Payer: Self-pay | Admitting: Neurosurgery

## 2016-01-04 ENCOUNTER — Encounter (HOSPITAL_COMMUNITY)
Admission: RE | Admit: 2016-01-04 | Discharge: 2016-01-04 | Disposition: A | Discharge status: Home / Self Care | Payer: 59 | Source: Ambulatory Visit | Attending: Neurosurgery | Admitting: Neurosurgery

## 2016-01-04 ENCOUNTER — Encounter (HOSPITAL_COMMUNITY): Payer: Self-pay

## 2016-01-04 DIAGNOSIS — F17200 Nicotine dependence, unspecified, uncomplicated: Secondary | ICD-10-CM | POA: Diagnosis not present

## 2016-01-04 DIAGNOSIS — M5126 Other intervertebral disc displacement, lumbar region: Secondary | ICD-10-CM | POA: Diagnosis present

## 2016-01-04 DIAGNOSIS — Z79899 Other long term (current) drug therapy: Secondary | ICD-10-CM | POA: Diagnosis not present

## 2016-01-04 DIAGNOSIS — J45909 Unspecified asthma, uncomplicated: Secondary | ICD-10-CM | POA: Diagnosis not present

## 2016-01-04 LAB — BASIC METABOLIC PANEL
ANION GAP: 5 (ref 5–15)
BUN: 7 mg/dL (ref 6–20)
CALCIUM: 9.4 mg/dL (ref 8.9–10.3)
CHLORIDE: 109 mmol/L (ref 101–111)
CO2: 24 mmol/L (ref 22–32)
CREATININE: 0.82 mg/dL (ref 0.44–1.00)
GFR calc non Af Amer: >60 mL/min (ref >60)
Glucose, Bld: 90 mg/dL (ref 65–99)
Potassium: 3.8 mmol/L (ref 3.5–5.1)
Sodium: 138 mmol/L (ref 135–145)

## 2016-01-04 LAB — CBC
HEMATOCRIT: 40.1 % (ref 36.0–46.0)
HEMOGLOBIN: 13.3 g/dL (ref 12.0–15.0)
MCH: 30.9 pg (ref 26.0–34.0)
MCHC: 33.2 g/dL (ref 30.0–36.0)
MCV: 93 fL (ref 78.0–100.0)
Platelets: 341 10*3/uL (ref 150–400)
RBC: 4.31 MIL/uL (ref 3.87–5.11)
RDW: 13.6 % (ref 11.5–15.5)
WBC: 8 10*3/uL (ref 4.0–10.5)

## 2016-01-04 LAB — SURGICAL PCR SCREEN
MRSA, PCR: NEGATIVE
Staphylococcus aureus: NEGATIVE

## 2016-01-04 LAB — HCG, SERUM, QUALITATIVE: Preg, Serum: NEGATIVE

## 2016-01-04 MED ORDER — CEFAZOLIN SODIUM-DEXTROSE 2-4 GM/100ML-% IV SOLN
2.0000 g | INTRAVENOUS | Status: AC
  Administered 2016-01-05: 2 g via INTRAVENOUS
  Filled 2016-01-04: qty 100

## 2016-01-04 NOTE — Progress Notes (Signed)
PCP is [facility]  Denies ever seeing a cardiologist. Denies ever having a card cath, Echo, or Stress test.

## 2016-01-04 NOTE — Pre-Procedure Instructions (Addendum)
Nani GasserFealethea D Cudd  01/04/2016      [facility] [address] [place] [address] Phone: [phone] Fax: [phone]  [facility] [place] - [address] [facility] [address] [place] [address] Phone: [phone] Fax: [phone]  [facility] [address], [place] - [address] [place] [address] Phone: [phone] Fax: [phone]  [id number] [place], [place] - [address] AT [facility] [address] [place] [address] Phone: [phone] Fax: [phone]    Your procedure is scheduled on July 6  [facility].M.  Call this number if you have problems the morning of surgery:  [phone]   Remember:  Do not eat food or drink liquids after midnight.  Take these medicines the morning of surgery with A SIP OF WATER albuterol inhaler if needed- bring with you on the day of surgery, Hydrocodone-acetaminophen (Norco) if needed  Stop taking aspirin, BC's, Goody's, Herbal medications, Fish Oil, Ibuprofen, Advil, Motrin, Aleve, Vitamins   Do not wear jewelry, make-up or nail polish.  Do not wear lotions, powders, or perfumes.  You may wear deoderant.  Do not shave 48 hours prior to surgery.  Men may shave face and neck.  Do not bring valuables to the hospital.  [facility] is not responsible for any belongings or valuables.  Contacts, dentures or bridgework may not be worn into surgery.  Leave your suitcase in the car.  After surgery it may be brought to your room.  For patients admitted to the hospital, discharge time will be determined by your treatment team.  Patients discharged the day of surgery will not be allowed to drive home.    Special instructions:  [facility] - Preparing for Surgery  Before surgery, you can play an important role.   Because skin is not sterile, your skin needs to be as free of germs as possible.  You can reduce the number of germs on you skin by washing with CHG (chlorahexidine gluconate) soap before surgery.  CHG is an antiseptic cleaner which kills germs and bonds with the skin to continue killing germs even after washing.  Please DO NOT use if you have an allergy to CHG or antibacterial soaps.  If your skin becomes reddened/irritated stop using the CHG and inform your nurse when you arrive at Short Stay.  Do not shave (including legs and underarms) for at least 48 hours prior to the first CHG shower.  You may shave your face.  Please follow these instructions carefully:   1.  Shower with CHG Soap the night before surgery and the    morning of Surgery.  2.  If you choose to wash your hair, wash your hair first as usual with your  normal shampoo.  3.  After you shampoo, rinse your hair and body thoroughly to remove the  Shampoo.  4.  Use CHG as you would any other liquid soap.  You can apply chg directly  to the skin and wash gently with scrungie or a clean washcloth.  5.  Apply the CHG Soap to your body ONLY FROM THE NECK DOWN.  Do not use on open wounds or open sores.  Avoid contact with your eyes,   ears, mouth and genitals (private parts).  Wash genitals (private parts)   with your normal soap.  6.  Wash  thoroughly, paying special attention to the area where your surgery   will be performed.  7.  Thoroughly rinse your body with warm water from the neck down.  8.  DO NOT shower/wash with your normal soap after using and rinsing off the CHG Soap.  9.  Pat yourself dry with a clean towel.            10.  Wear clean pajamas.            11.  Place clean sheets on your bed the night of your first shower and do not        sleep with pets.  Day of Surgery  Do not apply any lotions/deoderants the morning of surgery.  Please wear clean clothes to the hospital/surgery center.     Please read over the  following fact sheets that you were given. Pain Booklet, MRSA Information and Surgical Site Infection Prevention

## 2016-01-05 ENCOUNTER — Ambulatory Visit (HOSPITAL_COMMUNITY): Payer: 59

## 2016-01-05 ENCOUNTER — Ambulatory Visit (HOSPITAL_COMMUNITY): Payer: 59 | Admitting: Certified Registered Nurse Anesthetist

## 2016-01-05 ENCOUNTER — Encounter (HOSPITAL_COMMUNITY): Payer: Self-pay | Admitting: Anesthesiology

## 2016-01-05 ENCOUNTER — Ambulatory Visit (HOSPITAL_COMMUNITY)
Admission: RE | Admit: 2016-01-05 | Discharge: 2016-01-06 | Disposition: A | Discharge status: Home / Self Care | Payer: 59 | Source: Ambulatory Visit | Attending: Neurosurgery | Admitting: Neurosurgery

## 2016-01-05 ENCOUNTER — Encounter (HOSPITAL_COMMUNITY)
Admission: RE | Disposition: A | Discharge status: Home / Self Care | Payer: Self-pay | Source: Ambulatory Visit | Attending: Neurosurgery

## 2016-01-05 DIAGNOSIS — M5126 Other intervertebral disc displacement, lumbar region: Secondary | ICD-10-CM | POA: Diagnosis not present

## 2016-01-05 DIAGNOSIS — J45909 Unspecified asthma, uncomplicated: Secondary | ICD-10-CM | POA: Insufficient documentation

## 2016-01-05 DIAGNOSIS — Z79899 Other long term (current) drug therapy: Secondary | ICD-10-CM | POA: Insufficient documentation

## 2016-01-05 DIAGNOSIS — F17200 Nicotine dependence, unspecified, uncomplicated: Secondary | ICD-10-CM | POA: Insufficient documentation

## 2016-01-05 DIAGNOSIS — Z419 Encounter for procedure for purposes other than remedying health state, unspecified: Secondary | ICD-10-CM

## 2016-01-05 HISTORY — PX: LUMBAR LAMINECTOMY/DECOMPRESSION MICRODISCECTOMY: SHX5026

## 2016-01-05 SURGERY — LUMBAR LAMINECTOMY/DECOMPRESSION MICRODISCECTOMY 1 LEVEL
Anesthesia: General | Site: Spine Lumbar | Laterality: Right

## 2016-01-05 MED ORDER — CYCLOBENZAPRINE HCL 10 MG PO TABS
10.0000 mg | ORAL_TABLET | Freq: Three times a day (TID) | ORAL | Status: DC | PRN
  Administered 2016-01-05 – 2016-01-06 (×2): 10 mg via ORAL
  Filled 2016-01-05: qty 1

## 2016-01-05 MED ORDER — DEXAMETHASONE SODIUM PHOSPHATE 10 MG/ML IJ SOLN
INTRAMUSCULAR | Status: DC | PRN
  Administered 2016-01-05: 10 mg via INTRAVENOUS

## 2016-01-05 MED ORDER — ALBUTEROL SULFATE HFA 108 (90 BASE) MCG/ACT IN AERS: 1.0000 | INHALATION_SPRAY | RESPIRATORY_TRACT | Status: DC | PRN

## 2016-01-05 MED ORDER — ACETAMINOPHEN-CODEINE #3 300-30 MG PO TABS
1.0000 | ORAL_TABLET | ORAL | Status: DC | PRN
  Administered 2016-01-05 – 2016-01-06 (×3): 2 via ORAL
  Filled 2016-01-05 (×3): qty 2

## 2016-01-05 MED ORDER — BISACODYL 5 MG PO TBEC: 5.0000 mg | DELAYED_RELEASE_TABLET | Freq: Every day | ORAL | Status: DC | PRN

## 2016-01-05 MED ORDER — HYDROCODONE-ACETAMINOPHEN 5-325 MG PO TABS: 1.0000 | ORAL_TABLET | ORAL | Status: DC | PRN

## 2016-01-05 MED ORDER — LACTATED RINGERS IV SOLN
INTRAVENOUS | Status: DC
  Administered 2016-01-05 (×3): via INTRAVENOUS

## 2016-01-05 MED ORDER — ALBUTEROL SULFATE (2.5 MG/3ML) 0.083% IN NEBU: 2.5000 mg | INHALATION_SOLUTION | RESPIRATORY_TRACT | Status: DC | PRN

## 2016-01-05 MED ORDER — PHENOL 1.4 % MT LIQD: 1.0000 | OROMUCOSAL | Status: DC | PRN

## 2016-01-05 MED ORDER — CHLORHEXIDINE GLUCONATE CLOTH 2 % EX PADS: 6.0000 | MEDICATED_PAD | Freq: Once | CUTANEOUS | Status: DC

## 2016-01-05 MED ORDER — SUGAMMADEX SODIUM 200 MG/2ML IV SOLN
INTRAVENOUS | Status: DC | PRN
  Administered 2016-01-05: 175 mg via INTRAVENOUS

## 2016-01-05 MED ORDER — THROMBIN 5000 UNITS EX SOLR
CUTANEOUS | Status: DC | PRN
  Administered 2016-01-05 (×2): 5000 [IU] via TOPICAL

## 2016-01-05 MED ORDER — MIDAZOLAM HCL 2 MG/2ML IJ SOLN
INTRAMUSCULAR | Status: AC
  Filled 2016-01-05: qty 2

## 2016-01-05 MED ORDER — OXYCODONE-ACETAMINOPHEN 5-325 MG PO TABS: 1.0000 | ORAL_TABLET | ORAL | Status: DC | PRN

## 2016-01-05 MED ORDER — CYCLOBENZAPRINE HCL 10 MG PO TABS
ORAL_TABLET | ORAL | Status: AC
  Filled 2016-01-05: qty 1

## 2016-01-05 MED ORDER — HYDROMORPHONE HCL 1 MG/ML IJ SOLN
INTRAMUSCULAR | Status: AC
  Filled 2016-01-05: qty 1

## 2016-01-05 MED ORDER — ROCURONIUM BROMIDE 50 MG/5ML IV SOLN
INTRAVENOUS | Status: AC
  Filled 2016-01-05: qty 1

## 2016-01-05 MED ORDER — MIDAZOLAM HCL 5 MG/5ML IJ SOLN
INTRAMUSCULAR | Status: DC | PRN
  Administered 2016-01-05: 2 mg via INTRAVENOUS

## 2016-01-05 MED ORDER — FENTANYL CITRATE (PF) 250 MCG/5ML IJ SOLN
INTRAMUSCULAR | Status: AC
  Filled 2016-01-05: qty 5

## 2016-01-05 MED ORDER — ONDANSETRON HCL 4 MG/2ML IJ SOLN
INTRAMUSCULAR | Status: AC
  Filled 2016-01-05: qty 2

## 2016-01-05 MED ORDER — LIDOCAINE HCL (CARDIAC) 20 MG/ML IV SOLN
INTRAVENOUS | Status: DC | PRN
  Administered 2016-01-05: 60 mg via INTRAVENOUS

## 2016-01-05 MED ORDER — HYDROMORPHONE HCL 1 MG/ML IJ SOLN: 0.5000 mg | INTRAMUSCULAR | Status: DC | PRN

## 2016-01-05 MED ORDER — HEMOSTATIC AGENTS (NO CHARGE) OPTIME
TOPICAL | Status: DC | PRN
  Administered 2016-01-05: 1 via TOPICAL

## 2016-01-05 MED ORDER — HYDROMORPHONE HCL 1 MG/ML IJ SOLN
0.2500 mg | INTRAMUSCULAR | Status: DC | PRN
  Administered 2016-01-05 (×3): 0.5 mg via INTRAVENOUS

## 2016-01-05 MED ORDER — PROPOFOL 10 MG/ML IV BOLUS
INTRAVENOUS | Status: DC | PRN
  Administered 2016-01-05: 20 mg via INTRAVENOUS
  Administered 2016-01-05: 150 mg via INTRAVENOUS
  Administered 2016-01-05: 20 mg via INTRAVENOUS

## 2016-01-05 MED ORDER — ROCURONIUM BROMIDE 100 MG/10ML IV SOLN
INTRAVENOUS | Status: DC | PRN
Start: 2016-01-05 — End: 2016-01-05
  Administered 2016-01-05: 40 mg via INTRAVENOUS
  Administered 2016-01-05: 10 mg via INTRAVENOUS

## 2016-01-05 MED ORDER — POTASSIUM CHLORIDE IN NACL 20-0.9 MEQ/L-% IV SOLN
INTRAVENOUS | Status: DC
  Filled 2016-01-05 (×3): qty 1000

## 2016-01-05 MED ORDER — DEXAMETHASONE SODIUM PHOSPHATE 10 MG/ML IJ SOLN
INTRAMUSCULAR | Status: AC
  Filled 2016-01-05: qty 1

## 2016-01-05 MED ORDER — 0.9 % SODIUM CHLORIDE (POUR BTL) OPTIME
TOPICAL | Status: DC | PRN
  Administered 2016-01-05: 1000 mL

## 2016-01-05 MED ORDER — SODIUM CHLORIDE 0.9% FLUSH: 3.0000 mL | INTRAVENOUS | Status: DC | PRN

## 2016-01-05 MED ORDER — LIDOCAINE 2% (20 MG/ML) 5 ML SYRINGE
INTRAMUSCULAR | Status: AC
  Filled 2016-01-05: qty 5

## 2016-01-05 MED ORDER — SODIUM CHLORIDE 0.9% FLUSH
3.0000 mL | Freq: Two times a day (BID) | INTRAVENOUS | Status: DC
  Administered 2016-01-06: 3 mL via INTRAVENOUS

## 2016-01-05 MED ORDER — DOCUSATE SODIUM 100 MG PO CAPS
100.0000 mg | ORAL_CAPSULE | Freq: Two times a day (BID) | ORAL | Status: DC
  Administered 2016-01-05 – 2016-01-06 (×2): 100 mg via ORAL
  Filled 2016-01-05 (×2): qty 1

## 2016-01-05 MED ORDER — ACETAMINOPHEN 325 MG PO TABS: 650.0000 mg | ORAL_TABLET | ORAL | Status: DC | PRN

## 2016-01-05 MED ORDER — FENTANYL CITRATE (PF) 100 MCG/2ML IJ SOLN
INTRAMUSCULAR | Status: DC | PRN
  Administered 2016-01-05 (×5): 50 ug via INTRAVENOUS
  Administered 2016-01-05: 150 ug via INTRAVENOUS

## 2016-01-05 MED ORDER — SENNOSIDES-DOCUSATE SODIUM 8.6-50 MG PO TABS: 1.0000 | ORAL_TABLET | Freq: Every evening | ORAL | Status: DC | PRN

## 2016-01-05 MED ORDER — PROPOFOL 10 MG/ML IV BOLUS
INTRAVENOUS | Status: AC
  Filled 2016-01-05: qty 20

## 2016-01-05 MED ORDER — ACETAMINOPHEN 650 MG RE SUPP: 650.0000 mg | RECTAL | Status: DC | PRN

## 2016-01-05 MED ORDER — ALPRAZOLAM 0.25 MG PO TABS: 0.2500 mg | ORAL_TABLET | Freq: Every evening | ORAL | Status: DC | PRN

## 2016-01-05 MED ORDER — MENTHOL 3 MG MT LOZG: 1.0000 | LOZENGE | OROMUCOSAL | Status: DC | PRN

## 2016-01-05 MED ORDER — ONDANSETRON HCL 4 MG/2ML IJ SOLN
INTRAMUSCULAR | Status: DC | PRN
  Administered 2016-01-05: 4 mg via INTRAVENOUS

## 2016-01-05 MED ORDER — KETOROLAC TROMETHAMINE 30 MG/ML IJ SOLN
30.0000 mg | Freq: Four times a day (QID) | INTRAMUSCULAR | Status: DC
  Administered 2016-01-05 – 2016-01-06 (×4): 30 mg via INTRAVENOUS
  Filled 2016-01-05 (×4): qty 1

## 2016-01-05 MED ORDER — SUGAMMADEX SODIUM 200 MG/2ML IV SOLN
INTRAVENOUS | Status: AC
  Filled 2016-01-05: qty 2

## 2016-01-05 MED ORDER — ONDANSETRON HCL 4 MG/2ML IJ SOLN: 4.0000 mg | INTRAMUSCULAR | Status: DC | PRN

## 2016-01-05 MED ORDER — BUPIVACAINE HCL (PF) 0.5 % IJ SOLN
INTRAMUSCULAR | Status: DC | PRN
  Administered 2016-01-05: 10 mL

## 2016-01-05 MED ORDER — ACETAMINOPHEN 10 MG/ML IV SOLN
INTRAVENOUS | Status: AC
  Administered 2016-01-05: 1000 mg via INTRAVENOUS
  Filled 2016-01-05: qty 100

## 2016-01-05 SURGICAL SUPPLY — 51 items
BAG DECANTER FOR FLEXI CONT (MISCELLANEOUS) ×3 IMPLANT
BENZOIN TINCTURE PRP APPL 2/3 (GAUZE/BANDAGES/DRESSINGS) IMPLANT
BLADE CLIPPER SURG (BLADE) IMPLANT
BUR MATCHSTICK NEURO 3.0 LAGG (BURR) ×3 IMPLANT
CANISTER SUCT 3000ML PPV (MISCELLANEOUS) ×3 IMPLANT
CLOSURE WOUND 1/2 X4 (GAUZE/BANDAGES/DRESSINGS)
DECANTER SPIKE VIAL GLASS SM (MISCELLANEOUS) ×3 IMPLANT
DRAPE LAPAROTOMY 100X72X124 (DRAPES) ×3 IMPLANT
DRAPE MICROSCOPE LEICA (MISCELLANEOUS) ×3 IMPLANT
DRAPE POUCH INSTRU U-SHP 10X18 (DRAPES) ×3 IMPLANT
DRAPE SURG 17X23 STRL (DRAPES) ×3 IMPLANT
DURAPREP 26ML APPLICATOR (WOUND CARE) ×3 IMPLANT
ELECT REM PT RETURN 9FT ADLT (ELECTROSURGICAL) ×3
ELECTRODE REM PT RTRN 9FT ADLT (ELECTROSURGICAL) ×1 IMPLANT
GAUZE SPONGE 4X4 12PLY STRL (GAUZE/BANDAGES/DRESSINGS) IMPLANT
GAUZE SPONGE 4X4 16PLY XRAY LF (GAUZE/BANDAGES/DRESSINGS) IMPLANT
GLOVE BIOGEL M 8.0 STRL (GLOVE) ×3 IMPLANT
GLOVE BIOGEL PI IND STRL 6.5 (GLOVE) ×2 IMPLANT
GLOVE BIOGEL PI IND STRL 7.0 (GLOVE) ×1 IMPLANT
GLOVE BIOGEL PI INDICATOR 6.5 (GLOVE) ×4
GLOVE BIOGEL PI INDICATOR 7.0 (GLOVE) ×2
GLOVE ECLIPSE 6.5 STRL STRAW (GLOVE) ×3 IMPLANT
GLOVE EXAM NITRILE LRG STRL (GLOVE) IMPLANT
GLOVE EXAM NITRILE MD LF STRL (GLOVE) IMPLANT
GLOVE EXAM NITRILE XL STR (GLOVE) IMPLANT
GLOVE EXAM NITRILE XS STR PU (GLOVE) IMPLANT
GLOVE SURG SS PI 6.5 STRL IVOR (GLOVE) ×9 IMPLANT
GOWN STRL REUS W/ TWL LRG LVL3 (GOWN DISPOSABLE) ×2 IMPLANT
GOWN STRL REUS W/ TWL XL LVL3 (GOWN DISPOSABLE) ×1 IMPLANT
GOWN STRL REUS W/TWL 2XL LVL3 (GOWN DISPOSABLE) IMPLANT
GOWN STRL REUS W/TWL LRG LVL3 (GOWN DISPOSABLE) ×4
GOWN STRL REUS W/TWL XL LVL3 (GOWN DISPOSABLE) ×2
KIT BASIN OR (CUSTOM PROCEDURE TRAY) ×3 IMPLANT
KIT ROOM TURNOVER OR (KITS) ×3 IMPLANT
LIQUID BAND (GAUZE/BANDAGES/DRESSINGS) ×3 IMPLANT
NEEDLE HYPO 25X1 1.5 SAFETY (NEEDLE) ×3 IMPLANT
NEEDLE SPNL 18GX3.5 QUINCKE PK (NEEDLE) ×3 IMPLANT
NS IRRIG 1000ML POUR BTL (IV SOLUTION) ×3 IMPLANT
PACK LAMINECTOMY NEURO (CUSTOM PROCEDURE TRAY) ×3 IMPLANT
PAD ARMBOARD 7.5X6 YLW CONV (MISCELLANEOUS) ×21 IMPLANT
RUBBERBAND STERILE (MISCELLANEOUS) ×6 IMPLANT
SPONGE LAP 4X18 X RAY DECT (DISPOSABLE) IMPLANT
SPONGE SURGIFOAM ABS GEL SZ50 (HEMOSTASIS) ×3 IMPLANT
STRIP CLOSURE SKIN 1/2X4 (GAUZE/BANDAGES/DRESSINGS) IMPLANT
SUT VIC AB 0 CT1 18XCR BRD8 (SUTURE) ×1 IMPLANT
SUT VIC AB 0 CT1 8-18 (SUTURE) ×2
SUT VIC AB 2-0 CT1 18 (SUTURE) ×3 IMPLANT
SUT VIC AB 3-0 SH 8-18 (SUTURE) ×3 IMPLANT
TOWEL OR 17X24 6PK STRL BLUE (TOWEL DISPOSABLE) ×3 IMPLANT
TOWEL OR 17X26 10 PK STRL BLUE (TOWEL DISPOSABLE) ×3 IMPLANT
WATER STERILE IRR 1000ML POUR (IV SOLUTION) ×3 IMPLANT

## 2016-01-05 NOTE — H&P (Signed)
BP 130/88 mmHg  Pulse 87  Temp(Src) 98.3 F (36.8 C) (Oral)  Resp 18  Ht 5\' 6"  (1.676 m)  Wt 87.799 kg (193 lb 9 oz)  BMI 31.26 kg/m2  SpO2 100%   [name] is a young lady, 35 years of age, who has had pain in the lower back and right lower extremity since 10/2003.  She had fallen down some stairs at that time and was being taken care of by Dr. Turner Danielsowan.  She went through injections, she went through medications, she had a TENS unit, physical therapy, all without any relief of her pain.  She has been taking medications.  She is unable to take antiinflammatories orally secondary to peptic ulcer disease.  She has been continuing to work.      She has no bowel or bladder dysfunction.  She does not describe weakness in the lower extremities.  She says the pain is severe, 8/10.  The only thing that will help her is for her to lie down.  She has numbness and tingling in the right lower extremity.              DATA:                                                  No studies for review.        INTERVAL PFSH:                                  She works as an Human resources officerorder processor and has not missed work directly as a result of the pain.  In her explanation, if she had time and she was in a lot of pain she would take that time to deal with it, but she did not miss scheduled days of work.                   REVIEW OF SYSTEMS:                        Review of systems positive for arthritis, back pain, right leg pain, anxiety, and peptic ulcer disease.      MEDICATIONS:                         She is taking no medications.        PHYSICAL EXAMINATION:                    Vitals:  Height 5 feet 6 inches, weight 192 pounds, temperature is 99.4, blood pressure is 108/71, pulse is 87, pain is 8/10.  On exam, she is alert, oriented x4, and is answering all questions appropriately.  Memory, language, attention span, and fund of knowledge are normal.  Speech is clear, it is also fluent.  Symmetric facial  sensation and movement.  Uvula elevates in the midline.  Shoulder shrug is normal.  Tongue protrudes in the midline.  Strength 5/5 in the upper and lower extremities.  She can toe walk and heel walk without difficulty.  She had positive straight leg raising on the right, negative on the left.  Reflexes 2+ biceps, triceps, brachial radialis, knees, and ankles.  Intact proprioception at the ankle and  upper extremities.  No Hoffman's sign.  No clonus.  Romberg test was negative.  Gait was otherwise normal.      [name] for an MRI of the lumbar spine.  What she does have is a large herniated disk eccentric to the right side at L3-4.  This is consistent with the right lower extremity pain that she is reporting.     IMPRESSION/PLAN:                             She would like to proceed with operative decompression.  Risks and benefits, bleeding, infection, no relief, recurring disk herniation and other risks which are detailed in an instruction sheet which I gave her have been noted.  She understands and does wish to proceed.  We will try to get this scheduled for next week, Thursday.

## 2016-01-05 NOTE — Anesthesia Preprocedure Evaluation (Addendum)
Anesthesia Evaluation  Patient identified by MRN, date of birth, ID band Patient awake    Reviewed: Allergy & Precautions, NPO status , Patient's Chart, lab work & pertinent test results  History of Anesthesia Complications (+) history of anesthetic complications  Airway Mallampati: II  TM Distance: >3 FB Neck ROM: Full    Dental  (+) Teeth Intact, Dental Advisory Given   Pulmonary asthma , Current Smoker,    breath sounds clear to auscultation       Cardiovascular negative cardio ROS   Rhythm:Regular Rate:Normal     Neuro/Psych    GI/Hepatic Neg liver ROS, PUD, GERD  Controlled,  Endo/Other  negative endocrine ROS  Renal/GU negative Renal ROS     Musculoskeletal   Abdominal   Peds  Hematology   Anesthesia Other Findings   Reproductive/Obstetrics                           Anesthesia Physical Anesthesia Plan  ASA: III  Anesthesia Plan: General   Post-op Pain Management:    Induction: Intravenous  Airway Management Planned: Oral ETT  Additional Equipment:   Intra-op Plan:   Post-operative Plan: Extubation in OR  Informed Consent: I have reviewed the patients History and Physical, chart, labs and discussed the procedure including the risks, benefits and alternatives for the proposed anesthesia with the patient or authorized representative who has indicated his/her understanding and acceptance.   Dental advisory given  Plan Discussed with: CRNA and Anesthesiologist  Anesthesia Plan Comments:         Anesthesia Quick Evaluation

## 2016-01-05 NOTE — Op Note (Signed)
01/05/2016  4:17 PM  PATIENT:  [name]  35 y.o. female with a large herniated disc at L3/4 eccentric to the right side with pain in the right lower extremity. I have recommended and she has agreed to operative decompression.   PRE-OPERATIVE DIAGNOSIS:  Disc displacement, Lumbar 3/4  POST-OPERATIVE DIAGNOSIS:  Disc displacement,Lumbar 3/4, right  PROCEDURE:  Procedure(s): Right Lumbar three-four Microdiskectomy  SURGEON:   Surgeon(s): Coletta MemosKyle Athaliah Baumbach, MD Hilda LiasErnesto Botero, MD  ASSISTANTS:Botero  ANESTHESIA:   general  EBL:  Total I/O In: 2000 [I.V.:2000] Out: 50 [Blood:50]  BLOOD ADMINISTERED:none  CELL SAVER GIVEN:none  COUNT:per nursing  DRAINS: none   SPECIMEN:  No Specimen  DICTATION: [name] was taken to the operating room, intubated and placed under a general anesthetic without difficulty. She was positioned prone on a Wilson frame with all pressure points padded. Her back was prepped and draped in a sterile manner. I opened the skin with a 10 blade and carried the dissection down to the thoracolumbar fascia. I used both sharp dissection and the monopolar cautery to expose the lamina of L3, and L4. I confirmed my location with an intraoperative xray.  I used the drill, Kerrison punches, and curettes to perform a semihemilaminectomy of L3. I used the punches to remove the ligamentum flavum to expose the thecal sac. I brought the microscope into the operative field and with Dr.Botero's assistance we started our decompression of the spinal canal, thecal sac and L4 root(s). I cauterized epidural veins overlying the disc space then divided them sharply. I opened the disc space with a 15 blade and proceeded with the discectomy. I used pituitary rongeurs, curettes, and other instruments to remove disc material. After the discectomy was completed we inspected the L4 nerve root and felt it was well decompressed. I explored rostrally, laterally, medially, and caudally and was  satisfied with the decompression. I irrigated the wound, then closed in layers. I approximated the thoracolumbar fascia, subcutaneous, and subcuticular planes with vicryl sutures. I used dermabond for a sterile dressing.   PLAN OF CARE: Admit for overnight observation  PATIENT DISPOSITION:  PACU - hemodynamically stable.   Delay start of Pharmacological VTE agent (>24hrs) due to surgical blood loss or risk of bleeding:  yes

## 2016-01-05 NOTE — Transfer of Care (Signed)
Immediate Anesthesia Transfer of Care Note  Patient: [name]  Procedure(s) Performed: Procedure(s) with comments: Right Lumbar three-four Microdiskectomy (Right) - right  Patient Location: PACU  Anesthesia Type:General  Level of Consciousness: awake, oriented and patient cooperative  Airway & Oxygen Therapy: Patient Spontanous Breathing and Patient connected to nasal cannula oxygen  Post-op Assessment: Report given to RN and Post -op Vital signs reviewed and stable  Post vital signs: Reviewed  Last Vitals:  Filed Vitals:   01/05/16 1046 01/05/16 1612  BP: 130/88 135/83  Pulse: 87 86  Temp: 36.8 C 36.8 C  Resp: 18 12    Last Pain: There were no vitals filed for this visit.       Complications: No apparent anesthesia complications

## 2016-01-05 NOTE — Anesthesia Procedure Notes (Signed)
Procedure Name: Intubation Date/Time: 01/05/2016 2:25 PM Performed by: Lovie CholOCK, Recie Cirrincione K Pre-anesthesia Checklist: Patient identified, Emergency Drugs available, Suction available and Patient being monitored Patient Re-evaluated:Patient Re-evaluated prior to inductionOxygen Delivery Method: Circle System Utilized Preoxygenation: Pre-oxygenation with 100% oxygen Intubation Type: IV induction Ventilation: Mask ventilation without difficulty Laryngoscope Size: Miller and 2 Grade View: Grade I Tube type: Oral Tube size: 7.0 mm Number of attempts: 1 Airway Equipment and Method: Stylet and Oral airway Placement Confirmation: ETT inserted through vocal cords under direct vision,  positive ETCO2 and breath sounds checked- equal and bilateral Secured at: 21 cm Tube secured with: Tape Dental Injury: Teeth and Oropharynx as per pre-operative assessment

## 2016-01-05 NOTE — Progress Notes (Signed)
BP 151/81 mmHg  Pulse 55  Temp(Src) 97.9 F (36.6 C) (Oral)  Resp 20  Ht 5\' 6"  (1.676 m)  Wt 87.799 kg (193 lb 9 oz)  BMI 31.26 kg/m2  SpO2 100%  LMP  Alert and oriented x 4,speech is clear and fluent Wound is clean, and dry Has been up already, walking well Discharge tomorrow.

## 2016-01-06 ENCOUNTER — Encounter (HOSPITAL_COMMUNITY): Payer: Self-pay | Admitting: Neurosurgery

## 2016-01-06 DIAGNOSIS — M5126 Other intervertebral disc displacement, lumbar region: Secondary | ICD-10-CM | POA: Diagnosis not present

## 2016-01-06 MED ORDER — ACETAMINOPHEN-CODEINE #3 300-30 MG PO TABS: 1.0000 | ORAL_TABLET | Freq: Four times a day (QID) | ORAL | Status: DC | PRN

## 2016-01-06 MED ORDER — TIZANIDINE HCL 4 MG PO TABS: 4.0000 mg | ORAL_TABLET | Freq: Four times a day (QID) | ORAL | Status: DC | PRN

## 2016-01-06 NOTE — Discharge Instructions (Signed)
Lumbar Discectomy Care After A discectomy involves removal of discmaterial (the cartilage-like structures located between the bones of the back). It is done to relieve pressure on nerve roots. It can be used as a treatment for a back problem. The time in surgery depends on the findings in surgery and what is necessary to correct the problems. HOME CARE INSTRUCTIONS   Check the cut (incision) made by the surgeon twice a day for signs of infection. Some signs of infection may include:   A foul smelling, greenish or yellowish discharge from the wound.   Increased pain.   Increased redness over the incision (operative) site.   The skin edges may separate.   Flu-like symptoms (problems).   A temperature above 101.5 F (38.6 C).   Change your bandages in about 24 to 36 hours following surgery or as directed.   You may shower tomrrow.  Avoid bathtubs, swimming pools and hot tubs for three weeks or until your incision has healed completely.  Follow your doctor's instructions as to safe activities, exercises, and physical therapy.   Weight reduction may be beneficial if you are overweight.   Daily exercise is helpful to prevent the return of problems. Walking is permitted. You may use a treadmill without an incline. Cut down on activities and exercise if you have discomfort. You may also go up and down stairs as much as you can tolerate.   DO NOT lift anything heavier than 10 to 15 lbs. Avoid bending or twisting at the waist. Always bend your knees when lifting.   Maintain strength and range of motion as instructed.   Do not drive for 10 days, or as directed by your doctors. You may be a passenger . Lying back in the passenger seat may be more comfortable for you. Always wear a seatbelt.   Limit your sitting in a regular chair to 20 to 30 minutes at a time. There are no limitations for sitting in a recliner. You should lie down or walk in between sitting periods.   Only take  over-the-counter or prescription medicines for pain, discomfort, or fever as directed by your caregiver.  SEEK MEDICAL CARE IF:   There is increased bleeding (more than a small spot) from the wound.   You notice redness, swelling, or increasing pain in the wound.   Pus is coming from wound.   You develop an unexplained oral temperature above 102 F (38.9 C) develops.   You notice a foul smell coming from the wound or dressing.   You have increasing pain in your wound.  SEEK IMMEDIATE MEDICAL CARE IF:   You develop a rash.   You have difficulty breathing.   You develop any allergic problems to medicines given.  Document Released: 05/23/2004 Document Revised: 06/07/2011 Document Reviewed: 09/11/2007 ExitCare Patient Information    Wound Care Leave incision open to air. You may shower. Do not scrub directly on incision.  Do not put any creams, lotions, or ointments on incision. Activity Walk each and every day, increasing distance each day. No lifting greater than 5 lbs.  Avoid bending, arching, and twisting. No driving for 2 weeks; may ride as a passenger locally.  Diet Resume your normal diet.  Return to Work Will be discussed at you follow up appointment. Call Your Doctor If Any of These Occur Redness, drainage, or swelling at the wound.  Temperature greater than 101 degrees. Severe pain not relieved by pain medication. Incision starts to come apart. Follow Up Appt Call  today for appointment in 3-4 weeks ([phone]) or for problems.  If you have any hardware placed in your spine, you will need an x-ray before your appointment.

## 2016-01-06 NOTE — Progress Notes (Signed)
Patient alert and oriented, mae's well, voiding adequate amount of urine, swallowing without difficulty,  C/o mild pain. Patient discharged home with family. Script and discharged instructions given to patient. Patient and family stated understanding of d/c instructions given and has an appointment with MD.

## 2016-01-06 NOTE — Anesthesia Postprocedure Evaluation (Signed)
Anesthesia Post Note  Patient: [name]  Procedure(s) Performed: Procedure(s) (LRB): Right Lumbar three-four Microdiskectomy (Right)  Patient location during evaluation: PACU Anesthesia Type: General Level of consciousness: awake Pain management: pain level controlled Vital Signs Assessment: post-procedure vital signs reviewed and stable Respiratory status: spontaneous breathing Cardiovascular status: stable Anesthetic complications: no    Last Vitals:  Filed Vitals:   01/06/16 0830 01/06/16 1145  BP: 135/74 125/74  Pulse: 59 60  Temp: 36.9 C 36.9 C  Resp: 18 16    Last Pain:  Filed Vitals:   01/06/16 1206  PainSc: 6                  EDWARDS,Christiano Blandon

## 2016-02-29 ENCOUNTER — Emergency Department (HOSPITAL_COMMUNITY)
Admission: EM | Admit: 2016-02-29 | Discharge: 2016-02-29 | Disposition: A | Discharge status: Home / Self Care | Payer: Managed Care, Other (non HMO) | Attending: Emergency Medicine | Admitting: Emergency Medicine

## 2016-02-29 ENCOUNTER — Emergency Department (HOSPITAL_COMMUNITY): Payer: Managed Care, Other (non HMO)

## 2016-02-29 DIAGNOSIS — K047 Periapical abscess without sinus: Secondary | ICD-10-CM | POA: Diagnosis not present

## 2016-02-29 DIAGNOSIS — L03211 Cellulitis of face: Secondary | ICD-10-CM | POA: Diagnosis not present

## 2016-02-29 DIAGNOSIS — J45909 Unspecified asthma, uncomplicated: Secondary | ICD-10-CM | POA: Insufficient documentation

## 2016-02-29 DIAGNOSIS — F17200 Nicotine dependence, unspecified, uncomplicated: Secondary | ICD-10-CM | POA: Insufficient documentation

## 2016-02-29 DIAGNOSIS — R220 Localized swelling, mass and lump, head: Secondary | ICD-10-CM | POA: Diagnosis present

## 2016-02-29 DIAGNOSIS — Z79899 Other long term (current) drug therapy: Secondary | ICD-10-CM | POA: Diagnosis not present

## 2016-02-29 LAB — BASIC METABOLIC PANEL WITH GFR
Anion gap: 5 (ref 5–15)
BUN: 9 mg/dL (ref 6–20)
CO2: 23 mmol/L (ref 22–32)
Calcium: 8.9 mg/dL (ref 8.9–10.3)
Chloride: 111 mmol/L (ref 101–111)
Creatinine, Ser: 0.77 mg/dL (ref 0.44–1.00)
GFR calc Af Amer: >60 mL/min
GFR calc non Af Amer: >60 mL/min
Glucose, Bld: 103 mg/dL — ABNORMAL HIGH (ref 65–99)
Potassium: 3.8 mmol/L (ref 3.5–5.1)
Sodium: 139 mmol/L (ref 135–145)

## 2016-02-29 LAB — CBC WITH DIFFERENTIAL/PLATELET
BASOS ABS: 0 10*3/uL (ref 0.0–0.1)
Basophils Relative: 0 %
EOS PCT: 3 %
Eosinophils Absolute: 0.2 10*3/uL (ref 0.0–0.7)
HEMATOCRIT: 35.3 % — AB (ref 36.0–46.0)
Hemoglobin: 11.6 g/dL — ABNORMAL LOW (ref 12.0–15.0)
LYMPHS ABS: 2.4 10*3/uL (ref 0.7–4.0)
LYMPHS PCT: 28 %
MCH: 30.6 pg (ref 26.0–34.0)
MCHC: 32.9 g/dL (ref 30.0–36.0)
MCV: 93.1 fL (ref 78.0–100.0)
MONO ABS: 0.8 10*3/uL (ref 0.1–1.0)
MONOS PCT: 10 %
NEUTROS ABS: 5 10*3/uL (ref 1.7–7.7)
Neutrophils Relative %: 59 %
PLATELETS: 294 10*3/uL (ref 150–400)
RBC: 3.79 MIL/uL — ABNORMAL LOW (ref 3.87–5.11)
RDW: 13.8 % (ref 11.5–15.5)
WBC: 8.5 10*3/uL (ref 4.0–10.5)

## 2016-02-29 LAB — I-STAT BETA HCG BLOOD, ED (MC, WL, AP ONLY)

## 2016-02-29 MED ORDER — MORPHINE SULFATE (PF) 4 MG/ML IV SOLN
4.0000 mg | Freq: Once | INTRAVENOUS | Status: AC
  Administered 2016-02-29: 4 mg via INTRAVENOUS
  Filled 2016-02-29: qty 1

## 2016-02-29 MED ORDER — HYDROCODONE-ACETAMINOPHEN 5-325 MG PO TABS: 1.0000 | ORAL_TABLET | ORAL | 0 refills | Status: DC | PRN

## 2016-02-29 MED ORDER — CLINDAMYCIN PHOSPHATE 900 MG/50ML IV SOLN
900.0000 mg | Freq: Once | INTRAVENOUS | Status: AC
  Administered 2016-02-29: 900 mg via INTRAVENOUS
  Filled 2016-02-29: qty 50

## 2016-02-29 MED ORDER — IOPAMIDOL (ISOVUE-300) INJECTION 61%
INTRAVENOUS | Status: AC
  Administered 2016-02-29: 100 mL
  Filled 2016-02-29: qty 100

## 2016-02-29 MED ORDER — CLINDAMYCIN HCL 150 MG PO CAPS: 300.0000 mg | ORAL_CAPSULE | Freq: Four times a day (QID) | ORAL | 0 refills | Status: DC

## 2016-02-29 MED ORDER — ONDANSETRON HCL 4 MG/2ML IJ SOLN
4.0000 mg | Freq: Once | INTRAMUSCULAR | Status: AC
  Administered 2016-02-29: 4 mg via INTRAVENOUS
  Filled 2016-02-29: qty 2

## 2016-02-29 NOTE — ED Provider Notes (Signed)
[facility]-EMERGENCY DEPT Provider Note   CSN: [id number] Arrival date & time: 02/29/16  0003  By signing my name below, I, [name], attest that this documentation has been prepared under the direction and in the presence of [name], MD. Electronically Signed: [name], ED Scribe. 02/29/16. 2:12 AM.    History   Chief Complaint Chief Complaint  Patient presents with  . Facial Swelling   HPI Comments: [name] is a 35 y.o. female with a hx of Anemia who presents to the Emergency Department complaining of gradually worsening moderately throbbing right facial swelling onset 2 days ago. She explains that she has been experiencing ongoing moderate right upper dental pain for several months. She adds that she received antibiotics for her dental pain one month ago and took some yesterday with no relief. She reports an allergy to ibuprofen. No fever, chills, or any generalized rash.    The history is provided by the patient. No language interpreter was used.    Past Medical History:  Diagnosis Date  . Anemia   . Asthma   . Erosive esophagitis    distal esophagus, with low grade bleed 03/2009  . GERD (gastroesophageal reflux disease)   . Headache(784.0)   . History of migraine headaches   . IBS (irritable bowel syndrome)   . Infection    UTI    Patient Active Problem List   Diagnosis Date Noted  . HNP (herniated nucleus pulposus), lumbar 01/05/2016  . Depo contraception 02/17/2015  . Chronic back pain greater than 3 months duration 12/14/2014  . Normal labor 06/12/2014  . Flu vaccine need 11/03/2013  . Hyperemesis complicating pregnancy, antepartum 11/03/2013  . Healthcare maintenance 01/13/2013  . Seasonal allergies 01/13/2013  . Mesenteric adenitis 02/05/2012  . Pes planus (flat feet) 06/11/2011  . Reflux esophagitis 03/28/2009  . Asthma, mild intermittent, well-controlled 03/11/2009    Past Surgical History:  Procedure Laterality Date   . LUMBAR LAMINECTOMY/DECOMPRESSION MICRODISCECTOMY Right 01/05/2016   Procedure: Right Lumbar three-four Microdiskectomy;  Surgeon: Coletta Memos, MD;  Location: [facility];  Service: Neurosurgery;  Laterality: Right;  right  . NO PAST SURGERIES      OB History    Gravida Para Term Preterm AB Living   3 3 3     3    SAB TAB Ectopic Multiple Live Births         0 3       Home Medications    Prior to Admission medications   Medication Sig Start Date End Date Taking? Authorizing Provider  acetaminophen-codeine (TYLENOL #3) 300-30 MG tablet Take 1 tablet by mouth every 6 (six) hours as needed for moderate pain. Patient not taking: Reported on 02/29/2016 01/06/16   Coletta Memos, MD  tiZANidine (ZANAFLEX) 4 MG tablet Take 1 tablet (4 mg total) by mouth every 6 (six) hours as needed for muscle spasms. Patient not taking: Reported on 02/29/2016 01/06/16   Coletta Memos, MD    Family History Family History  Problem Relation Age of Onset  . Migraines Mother   . Asthma Mother   . Migraines Father   . Heart disease Father   . Asthma Daughter   . Cancer Maternal Grandmother     Social History Social History  Substance Use Topics  . Smoking status: Light Tobacco Smoker  . Smokeless tobacco: Never Used  . Alcohol use No     Comment: occasional on holidays     Allergies   Ibuprofen   Review  of Systems Review of Systems  Constitutional: Negative for chills and fever.  HENT: Positive for dental problem and facial swelling.   Skin: Negative for rash.  All other systems reviewed and are negative.    Physical Exam Updated Vital Signs BP 121/76   Pulse 69   Temp 98.4 F (36.9 C) (Oral)   Resp 18   Ht 5\' 6"  (1.676 m)   Wt 189 lb (85.7 kg)   SpO2 100%   BMI 30.51 kg/m   Physical Exam  Constitutional: She is oriented to person, place, and time. She appears well-developed and well-nourished. No distress.  HENT:  Head: Normocephalic and atraumatic.  Right Ear: Hearing normal.   Left Ear: Hearing normal.  Nose: Nose normal.  Mouth/Throat: Oropharynx is clear and moist and mucous membranes are normal.  Right maxillary, right per-orbital swelling with tenderness, no induration or fluctuance. Large cavity of right lateral upper incisor  Eyes: Conjunctivae and EOM are normal. Pupils are equal, round, and reactive to light.  Neck: Normal range of motion. Neck supple.  Cardiovascular: Regular rhythm, S1 normal and S2 normal.  Exam reveals no gallop and no friction rub.   No murmur heard. Pulmonary/Chest: Effort normal and breath sounds normal. No respiratory distress. She exhibits no tenderness.  Abdominal: Soft. Normal appearance and bowel sounds are normal. There is no hepatosplenomegaly. There is no tenderness. There is no rebound, no guarding, no tenderness at McBurney's point and negative Murphy's sign. No hernia.  Musculoskeletal: Normal range of motion.  Neurological: She is alert and oriented to person, place, and time. She has normal strength. No cranial nerve deficit or sensory deficit. Coordination normal. GCS eye subscore is 4. GCS verbal subscore is 5. GCS motor subscore is 6.  Skin: Skin is warm, dry and intact. No rash noted. No cyanosis.  Psychiatric: She has a normal mood and affect. Her speech is normal and behavior is normal. Thought content normal.  Nursing note and vitals reviewed.    ED Treatments / Results  DIAGNOSTIC STUDIES: Oxygen Saturation is 100% on RA, normal by my interpretation.    COORDINATION OF CARE: 2:04 AM- Pt advised of plan for treatment and pt agrees. She will receive lab work and CT maxillofacial for further evaluation.    Labs (all labs ordered are listed, but only abnormal results are displayed) Labs Reviewed  CBC WITH DIFFERENTIAL/PLATELET - Abnormal; Notable for the following:       Result Value   RBC 3.79 (*)    Hemoglobin 11.6 (*)    HCT 35.3 (*)    All other components within normal limits  BASIC METABOLIC PANEL -  Abnormal; Notable for the following:    Glucose, Bld 103 (*)    All other components within normal limits  I-STAT BETA HCG BLOOD, ED ([facility], [facility], [facility] ONLY)    EKG  EKG Interpretation None       Radiology Ct Maxillofacial W Contrast  Result Date: 02/29/2016 CLINICAL DATA:  Right-sided facial swelling. Right high and right cheek swelling. EXAM: CT MAXILLOFACIAL WITH CONTRAST TECHNIQUE: Multidetector CT imaging of the maxillofacial structures was performed with intravenous contrast. Multiplanar CT image reconstructions were also generated. A small metallic BB was placed on the right temple in order to reliably differentiate right from left. CONTRAST:  ISOVUE-300 IOPAMIDOL (ISOVUE-300) INJECTION 61% COMPARISON:  None. FINDINGS: Extracranial soft tissues: There is marked soft tissue swelling of the infraorbital right face. There is no associated fluid collection. Swelling and inflammatory stranding extent to  the subcutaneous soft tissues overlying the right maxilla and right mandibular body. Again, there is no fluid collection. Oral cavity: Mild periapical lucencies of the lateral maxillary incisors and right canine. Multiple dental caries and sites of dental amalgam. Orbits: Globes appear intact. Normal intra- and extraconal fat. The periorbital soft tissue swelling shows no postseptal extension. The Paranasal sinuses and mastoids: Free of fluid. Mild right maxillary soft tissue swelling. Calvarium and skull base No fracture identified. Visualized pharynx:  Normal Salivary glands:  Normal. IMPRESSION: 1. Right facial soft tissue swelling extending from the inferior periorbital region to the soft tissues overlying the right maxilla and mandibular body. The appearance is most suggestive of cellulitis. No abscess or fluid collection. No clear source is identified. 2. No evidence of orbital involvement by the above-described process. Electronically Signed   By: Deatra RobinsonKevin  Herman M.D.   On: 02/29/2016 05:38     Procedures Procedures (including critical care time)  Medications Ordered in ED Medications  clindamycin (CLEOCIN) IVPB 900 mg (0 mg Intravenous Stopped 02/29/16 0322)  morphine 4 MG/ML injection 4 mg (4 mg Intravenous Given 02/29/16 0252)  ondansetron (ZOFRAN) injection 4 mg (4 mg Intravenous Given 02/29/16 0252)  iopamidol (ISOVUE-300) 61 % injection (100 mLs  Contrast Given 02/29/16 0456)  morphine 4 MG/ML injection 4 mg (4 mg Intravenous Given 02/29/16 0516)     Initial Impression / Assessment and Plan / ED Course  Gilda Creasehristopher J Lindsie Simar, MD has reviewed the triage vital signs and the nursing notes.  Pertinent labs & imaging results that were available during my care of the patient were reviewed by me and considered in my medical decision making (see chart for details).  Clinical Course    Patient presents to the emergency department for evaluation of swelling of the right side of her face. Patient reports symptoms began one day ago. She started to have swelling around the area of her nose and right upper lip area, in the region of the tooth that has significant cavity. Since then the swelling has progressed upwards and she has some edema around her right eye. No pain with extraocular motion, no diplopia.  Patient underwent CT scan of maxillofacial area with IV contrast. No abscesses are noted. There is no orbital infection. Patient does have soft tissue swelling is consistent with cellulitis. Based on her examination, however, dental abscess still felt to be possible. Patient was administered IV clindamycin here in the ER which should cover dental abscess as well as cellulitis. She will be discharged with clindamycin, return if her swelling worsens. Follow-up with dentist.  Final Clinical Impressions(s) / ED Diagnoses   Final diagnoses:  Dental abscess  Cellulitis of face    New Prescriptions New Prescriptions   No medications on file   I personally performed the services  described in this documentation, which was scribed in my presence. The recorded information has been reviewed and is accurate.    Gilda Creasehristopher J Chon Buhl, MD 02/29/16 [phone]

## 2016-02-29 NOTE — ED Notes (Signed)
Patient transported to CT 

## 2016-02-29 NOTE — ED Notes (Signed)
Pt returned from CT scan at this time; will continue to monitor

## 2016-02-29 NOTE — ED Triage Notes (Addendum)
Pt arrives with c/o R sided facial swelling onset this morning, airway intact at this time. R eye R cheek and upper lip swollen. Allergies to ibuprofen, denies exposures to new products. States she thinks its an abscess. Started this morning around piercing on upper R lip, she noticed it was hurting, then ate some chicken and went to bed, woke up with the swelling.

## 2016-10-21 ENCOUNTER — Emergency Department (HOSPITAL_COMMUNITY)
Admission: EM | Admit: 2016-10-21 | Discharge: 2016-10-21 | Disposition: A | Discharge status: Home / Self Care | Payer: Medicaid Other | Attending: Emergency Medicine | Admitting: Emergency Medicine

## 2016-10-21 ENCOUNTER — Encounter (HOSPITAL_COMMUNITY): Payer: Self-pay

## 2016-10-21 DIAGNOSIS — J45909 Unspecified asthma, uncomplicated: Secondary | ICD-10-CM | POA: Insufficient documentation

## 2016-10-21 DIAGNOSIS — J029 Acute pharyngitis, unspecified: Secondary | ICD-10-CM | POA: Diagnosis present

## 2016-10-21 DIAGNOSIS — Z79899 Other long term (current) drug therapy: Secondary | ICD-10-CM | POA: Diagnosis not present

## 2016-10-21 DIAGNOSIS — F17200 Nicotine dependence, unspecified, uncomplicated: Secondary | ICD-10-CM | POA: Diagnosis not present

## 2016-10-21 DIAGNOSIS — J039 Acute tonsillitis, unspecified: Secondary | ICD-10-CM | POA: Insufficient documentation

## 2016-10-21 LAB — RAPID STREP SCREEN (MED CTR MEBANE ONLY): Streptococcus, Group A Screen (Direct): NEGATIVE

## 2016-10-21 MED ORDER — CLINDAMYCIN HCL 300 MG PO CAPS: 300.0000 mg | ORAL_CAPSULE | Freq: Three times a day (TID) | ORAL | 0 refills | Status: DC

## 2016-10-21 MED ORDER — CLINDAMYCIN HCL 150 MG PO CAPS
300.0000 mg | ORAL_CAPSULE | Freq: Once | ORAL | Status: AC
  Administered 2016-10-21: 300 mg via ORAL
  Filled 2016-10-21: qty 2

## 2016-10-21 MED ORDER — DEXAMETHASONE SODIUM PHOSPHATE 10 MG/ML IJ SOLN
10.0000 mg | Freq: Once | INTRAMUSCULAR | Status: AC
  Administered 2016-10-21: 10 mg via INTRAMUSCULAR
  Filled 2016-10-21: qty 1

## 2016-10-21 MED ORDER — ACETAMINOPHEN 325 MG PO TABS
ORAL_TABLET | ORAL | Status: AC
  Filled 2016-10-21: qty 2

## 2016-10-21 MED ORDER — ACETAMINOPHEN 325 MG PO TABS
650.0000 mg | ORAL_TABLET | Freq: Once | ORAL | Status: AC | PRN
  Administered 2016-10-21: 650 mg via ORAL

## 2016-10-21 MED ORDER — KETOROLAC TROMETHAMINE 30 MG/ML IJ SOLN: 60.0000 mg | Freq: Once | INTRAMUSCULAR | Status: DC

## 2016-10-21 NOTE — ED Provider Notes (Signed)
TIME SEEN: 5:53 AM  CHIEF COMPLAINT: Sore throat  HPI: Patient is a 36 year old female who presents emergency department with 2 days of subjective fevers, chills, bodyaches, sore throat. States pain is worse with swallowing. No sick contacts. No recent travel. No cough. No vomiting or diarrhea.  ROS: See HPI Constitutional: Subjective fever  Eyes: no drainage  ENT: no runny nose   Cardiovascular:  no chest pain  Resp: no SOB  GI: no vomiting GU: no dysuria Integumentary: no rash  Allergy: no hives  Musculoskeletal: no leg swelling  Neurological: no slurred speech ROS otherwise negative  PAST MEDICAL HISTORY/PAST SURGICAL HISTORY:  Past Medical History:  Diagnosis Date  . Anemia   . Asthma   . Erosive esophagitis    distal esophagus, with low grade bleed 03/2009  . GERD (gastroesophageal reflux disease)   . Headache(784.0)   . History of migraine headaches   . IBS (irritable bowel syndrome)   . Infection    UTI    MEDICATIONS:  Prior to Admission medications   Medication Sig Start Date End Date Taking? Authorizing Provider  clindamycin (CLEOCIN) 150 MG capsule Take 2 capsules (300 mg total) by mouth 4 (four) times daily. 02/29/16   [name], MD  HYDROcodone-acetaminophen (NORCO/VICODIN) 5-325 MG tablet Take 1 tablet by mouth every 4 (four) hours as needed for moderate pain. 02/29/16   [name], MD    ALLERGIES:  Allergies  Allergen Reactions  . Ibuprofen Other (See Comments)    Ulcer flare up     SOCIAL HISTORY:  Social History  Substance Use Topics  . Smoking status: Light Tobacco Smoker  . Smokeless tobacco: Never Used  . Alcohol use No     Comment: occasional on holidays    FAMILY HISTORY: Family History  Problem Relation Age of Onset  . Migraines Mother   . Asthma Mother   . Migraines Father   . Heart disease Father   . Asthma Daughter   . Cancer Maternal Grandmother     EXAM: BP (!) 146/72 (BP Location: Left Arm)    Pulse (!) 102   Temp 99.9 F (37.7 C) (Oral)   Resp 16   Ht  (1.676 m)   Wt 210 lb (95.3 kg)   SpO2 100%   BMI 33.89 kg/m  CONSTITUTIONAL: Alert and oriented and responds appropriately to questions. Well-appearing; well-nourished HEAD: Normocephalic EYES: Conjunctivae clear, pupils appear equal, EOMI ENT: normal nose; moist mucous membranes; patient does have posterior pharyngeal erythema without petechiae, bilateral tonsillar hypertrophy with exudate, no uvular deviation, no unilateral swelling, no trismus or drooling, no muffled voice, normal phonation, no stridor, no dental caries present, no drainable dental abscess noted, no Ludwig's angina, tongue sits flat in the bottom of the mouth, no angioedema, no facial erythema or warmth, no facial swelling; no pain with movement of the neck.  TMs are clear bilaterally without erythema, purulence, bulging, perforation, effusion.  No cerumen impaction or sign of foreign body in the external auditory canal. No inflammation, erythema or drainage from the external auditory canal. No signs of mastoiditis. No pain with manipulation of the pinna bilaterally. NECK: Supple, no meningismus, no nuchal rigidity, no LAD  CARD: RRR; S1 and S2 appreciated; no murmurs, no clicks, no rubs, no gallops RESP: Normal chest excursion without splinting or tachypnea; breath sounds clear and equal bilaterally; no wheezes, no rhonchi, no rales, no hypoxia or respiratory distress, speaking full sentences ABD/GI: Normal bowel sounds; non-distended; soft, non-tender, no  rebound, no guarding, no peritoneal signs, no hepatosplenomegaly BACK:  The back appears normal and is non-tender to palpation, there is no CVA tenderness EXT: Normal ROM in all joints; non-tender to palpation; no edema; normal capillary refill; no cyanosis, no calf tenderness or swelling    SKIN: Normal color for age and race; warm; no rash NEURO: Moves all extremities equally PSYCH: The patient's mood and  manner are appropriate. Grooming and personal hygiene are appropriate.  MEDICAL DECISION MAKING: Patient here tonsillitis. Strep test is negative. Nothing at this time to suggest deep space neck infection, peritonsillar abscess, meningitis. We'll discharge on clindamycin. Recommended Tylenol for pain control. States she cannot take NSAIDs because history of ulcers. Will give dose of IM Decadron for symptomatically relief here. I feel she is safe to be discharged home.   At this time, I do not feel there is any life-threatening condition present. I have reviewed and discussed all results (EKG, imaging, lab, urine as appropriate) and exam findings with patient/family. I have reviewed nursing notes and appropriate previous records.  I feel the patient is safe to be discharged home without further emergent workup and can continue workup as an outpatient as needed. Discussed usual and customary return precautions. Patient/family verbalize understanding and are comfortable with this plan.  Outpatient follow-up has been provided if needed. All questions have been answered.      Layla Maw Ward, DO 10/21/16 [phone]

## 2016-10-21 NOTE — ED Triage Notes (Signed)
Pt endorses sore throat that began yesterday morning with chills. Oral temp 100.7 in triage. Breath sounds clear. Airway intact. Pt took tylenol yesterday without relief.

## 2016-10-21 NOTE — Discharge Instructions (Signed)
You can take Tylenol 1000 mg every 6 hours as needed for pain or fever.

## 2016-10-21 NOTE — ED Notes (Signed)
Pt stable, ambulatory, states understanding of discharge instructions 

## 2016-10-23 LAB — CULTURE, GROUP A STREP (THRC)

## 2016-10-24 ENCOUNTER — Telehealth: Payer: Self-pay | Admitting: Emergency Medicine

## 2016-10-24 NOTE — Telephone Encounter (Signed)
Post ED Visit - Positive Culture Follow-up  Culture report reviewed by antimicrobial stewardship pharmacist:   Enzo Bi, Pharm.D.  Celedonio Miyamoto, Pharm.D., BCPS AQ-ID  Garvin Fila, Pharm.D., BCPS  Georgina Pillion, Pharm.D., BCPS  [place], [address].D., BCPS, AAHIVP  Estella Husk, Pharm.D., BCPS, AAHIVP  Lysle Pearl, PharmD, BCPS  Casilda Carls, PharmD, BCPS  Pollyann Samples, PharmD, BCPS  Positive strep culture Treated with clindamycin, organism sensitive to the same and no further patient follow-up is required at this time.  Berle Mull 10/24/2016, 1:53 PM

## 2017-01-25 ENCOUNTER — Other Ambulatory Visit: Payer: Self-pay | Admitting: Neurosurgery

## 2017-01-25 DIAGNOSIS — M5126 Other intervertebral disc displacement, lumbar region: Secondary | ICD-10-CM

## 2017-02-05 ENCOUNTER — Ambulatory Visit
Admission: RE | Admit: 2017-02-05 | Discharge: 2017-02-05 | Disposition: A | Discharge status: Home / Self Care | Payer: 59 | Source: Ambulatory Visit | Attending: Neurosurgery | Admitting: Neurosurgery

## 2017-02-05 ENCOUNTER — Other Ambulatory Visit: Payer: Self-pay | Admitting: Neurosurgery

## 2017-02-05 DIAGNOSIS — M5126 Other intervertebral disc displacement, lumbar region: Secondary | ICD-10-CM

## 2017-02-05 MED ORDER — IOPAMIDOL (ISOVUE-M 200) INJECTION 41%
1.0000 mL | Freq: Once | INTRAMUSCULAR | Status: AC
  Administered 2017-02-05: 1 mL via EPIDURAL

## 2017-02-05 MED ORDER — METHYLPREDNISOLONE ACETATE 40 MG/ML INJ SUSP (RADIOLOG
120.0000 mg | Freq: Once | INTRAMUSCULAR | Status: AC
  Administered 2017-02-05: 120 mg via EPIDURAL

## 2017-02-05 NOTE — Discharge Instructions (Signed)

## 2017-10-02 ENCOUNTER — Emergency Department (HOSPITAL_COMMUNITY)
Admission: EM | Admit: 2017-10-02 | Discharge: 2017-10-02 | Disposition: A | Discharge status: Home / Self Care | Payer: 59 | Attending: Emergency Medicine | Admitting: Emergency Medicine

## 2017-10-02 ENCOUNTER — Emergency Department (HOSPITAL_COMMUNITY): Payer: 59

## 2017-10-02 ENCOUNTER — Other Ambulatory Visit: Payer: Self-pay

## 2017-10-02 DIAGNOSIS — J45909 Unspecified asthma, uncomplicated: Secondary | ICD-10-CM | POA: Insufficient documentation

## 2017-10-02 DIAGNOSIS — R0789 Other chest pain: Secondary | ICD-10-CM

## 2017-10-02 DIAGNOSIS — F17290 Nicotine dependence, other tobacco product, uncomplicated: Secondary | ICD-10-CM | POA: Insufficient documentation

## 2017-10-02 LAB — I-STAT TROPONIN, ED
Troponin i, poc: 0 ng/mL (ref 0.00–0.08)
Troponin i, poc: 0.01 ng/mL (ref 0.00–0.08)

## 2017-10-02 LAB — CBC
HEMATOCRIT: 37.9 % (ref 36.0–46.0)
HEMOGLOBIN: 12.8 g/dL (ref 12.0–15.0)
MCH: 31.2 pg (ref 26.0–34.0)
MCHC: 33.8 g/dL (ref 30.0–36.0)
MCV: 92.4 fL (ref 78.0–100.0)
Platelets: 290 10*3/uL (ref 150–400)
RBC: 4.1 MIL/uL (ref 3.87–5.11)
RDW: 13.9 % (ref 11.5–15.5)
WBC: 7.3 10*3/uL (ref 4.0–10.5)

## 2017-10-02 LAB — D-DIMER, QUANTITATIVE (NOT AT ARMC): D-Dimer, Quant: <0.27 ug/mL-FEU (ref 0.00–0.50)

## 2017-10-02 LAB — BASIC METABOLIC PANEL
ANION GAP: 9 (ref 5–15)
BUN: 6 mg/dL (ref 6–20)
CALCIUM: 9.1 mg/dL (ref 8.9–10.3)
CO2: 20 mmol/L — AB (ref 22–32)
Chloride: 109 mmol/L (ref 101–111)
Creatinine, Ser: 0.79 mg/dL (ref 0.44–1.00)
GFR calc Af Amer: >60 mL/min (ref >60)
GFR calc non Af Amer: >60 mL/min (ref >60)
GLUCOSE: 98 mg/dL (ref 65–99)
POTASSIUM: 3.6 mmol/L (ref 3.5–5.1)
Sodium: 138 mmol/L (ref 135–145)

## 2017-10-02 LAB — I-STAT BETA HCG BLOOD, ED (MC, WL, AP ONLY): I-stat hCG, quantitative: <5 m[IU]/mL (ref <5)

## 2017-10-02 MED ORDER — TRAMADOL HCL 50 MG PO TABS: 50.0000 mg | ORAL_TABLET | Freq: Four times a day (QID) | ORAL | 0 refills | Status: DC | PRN

## 2017-10-02 MED ORDER — KETOROLAC TROMETHAMINE 30 MG/ML IJ SOLN
30.0000 mg | Freq: Once | INTRAMUSCULAR | Status: AC
  Administered 2017-10-02: 30 mg via INTRAVENOUS
  Filled 2017-10-02: qty 1

## 2017-10-02 MED ORDER — HYDROCODONE-ACETAMINOPHEN 5-325 MG PO TABS
1.0000 | ORAL_TABLET | Freq: Once | ORAL | Status: AC
  Administered 2017-10-02: 1 via ORAL
  Filled 2017-10-02: qty 1

## 2017-10-02 MED ORDER — GI COCKTAIL ~~LOC~~
30.0000 mL | Freq: Once | ORAL | Status: AC
  Administered 2017-10-02: 30 mL via ORAL
  Filled 2017-10-02: qty 30

## 2017-10-02 NOTE — Discharge Instructions (Addendum)
Return here as needed.  Your cardiac enzyme test were negative.  Your EKG did not show any signs of heart damage or heart attack.  Your d-dimer did not show any elevation which could indicate blood clot.  Follow-up with a primary doctor.

## 2017-10-02 NOTE — ED Triage Notes (Signed)
Pt in c/o sudden onset of left sided chest pain this morning, denies cough or recent illness, states pain started suddenly while cleaning her house, no improvement with rest, pt appears uncomfortable on arrival

## 2017-10-02 NOTE — ED Notes (Signed)
Hx of gerd; pain made worse by inhalation and swallowing food; occasional smoker. No fevers.

## 2017-10-02 NOTE — ED Provider Notes (Signed)
[facility] EMERGENCY DEPARTMENT Provider Note   CSN: [id number] Arrival date & time: 10/02/17  4098     History   Chief Complaint Chief Complaint  Patient presents with  . Chest Pain    HPI [name] is a 37 y.o. female.  HPI Patient presents to the emergency department with left-sided chest pain that started around 820 this morning.  The patient states that certain movements and palpation make the pain worse here the patient states that she did not take any medications prior to arrival.  Patient states that she is never had pain like this in the past.  She does have a history of acid reflux.  The patient denies  shortness of breath, headache,blurred vision, neck pain, fever, cough, weakness, numbness, dizziness, anorexia, edema, abdominal pain, nausea, vomiting, diarrhea, rash, back pain, dysuria, hematemesis, bloody stool, near syncope, or syncope. Past Medical History:  Diagnosis Date  . Anemia   . Asthma   . Erosive esophagitis    distal esophagus, with low grade bleed 03/2009  . GERD (gastroesophageal reflux disease)   . Headache(784.0)   . History of migraine headaches   . IBS (irritable bowel syndrome)   . Infection    UTI    Patient Active Problem List   Diagnosis Date Noted  . HNP (herniated nucleus pulposus), lumbar 01/05/2016  . Depo contraception 02/17/2015  . Chronic back pain greater than 3 months duration 12/14/2014  . Normal labor 06/12/2014  . Flu vaccine need 11/03/2013  . Hyperemesis complicating pregnancy, antepartum 11/03/2013  . Healthcare maintenance 01/13/2013  . Seasonal allergies 01/13/2013  . Mesenteric adenitis 02/05/2012  . Pes planus (flat feet) 06/11/2011  . Reflux esophagitis 03/28/2009  . Asthma, mild intermittent, well-controlled 03/11/2009    Past Surgical History:  Procedure Laterality Date  . LUMBAR LAMINECTOMY/DECOMPRESSION MICRODISCECTOMY Right 01/05/2016   Procedure: Right Lumbar three-four  Microdiskectomy;  Surgeon: Coletta Memos, MD;  Location: [facility];  Service: Neurosurgery;  Laterality: Right;  right  . NO PAST SURGERIES       OB History    Gravida  3   Para  3   Term  3   Preterm      AB      Living  3     SAB      TAB      Ectopic      Multiple  0   Live Births  3            Home Medications    Prior to Admission medications   Medication Sig Start Date End Date Taking? Authorizing Provider  medroxyPROGESTERone Acetate 150 MG/ML SUSY Inject 150 mLs into the skin every 3 (three) months. 08/12/17  Yes [provider]    Family History Family History  Problem Relation Age of Onset  . Migraines Mother   . Asthma Mother   . Migraines Father   . Heart disease Father   . Asthma Daughter   . Cancer Maternal Grandmother     Social History Social History   Tobacco Use  . Smoking status: Light Tobacco Smoker  . Smokeless tobacco: Never Used  Substance Use Topics  . Alcohol use: No    Comment: occasional on holidays  . Drug use: No     Allergies   Ibuprofen   Review of Systems Review of Systems All other systems negative except as documented in the HPI. All pertinent positives and negatives as reviewed in the HPI.  Physical Exam Updated Vital Signs BP 121/81   Pulse (!) 59   Temp 98.5 F (36.9 C) (Oral)   Resp 20   SpO2 99%   Physical Exam  Constitutional: She is oriented to person, place, and time. She appears well-developed and well-nourished. No distress.  HENT:  Head: Normocephalic and atraumatic.  Mouth/Throat: Oropharynx is clear and moist.  Eyes: Pupils are equal, round, and reactive to light.  Neck: Normal range of motion. Neck supple.  Cardiovascular: Normal rate, regular rhythm and normal heart sounds. Exam reveals no gallop and no friction rub.  No murmur heard. Pulmonary/Chest: Effort normal and breath sounds normal. No respiratory distress. She has no wheezes.  Abdominal: Soft. Normal  appearance and bowel sounds are normal. She exhibits no distension. There is tenderness.    Neurological: She is alert and oriented to person, place, and time. She exhibits normal muscle tone. Coordination normal.  Skin: Skin is warm and dry. Capillary refill takes less than 2 seconds. No rash noted. No erythema.  Psychiatric: She has a normal mood and affect. Her behavior is normal.  Nursing note and vitals reviewed.    ED Treatments / Results  Labs (all labs ordered are listed, but only abnormal results are displayed) Labs Reviewed  BASIC METABOLIC PANEL - Abnormal; Notable for the following components:      Result Value   CO2 20 (*)    All other components within normal limits  CBC  D-DIMER, QUANTITATIVE (NOT AT [facility])  I-STAT TROPONIN, ED  I-STAT BETA HCG BLOOD, ED ([facility], [facility], [facility] ONLY)  I-STAT TROPONIN, ED    EKG EKG Interpretation  Date/Time:  Wednesday October 02 2017 08:43:59 EDT Ventricular Rate:  86 PR Interval:  142 QRS Duration: 82 QT Interval:  376 QTC Calculation: 449 R Axis:   74 Text Interpretation:  Normal sinus rhythm Nonspecific T wave abnormality NO STEMI Confirmed by Drema Pry [phone]) on 10/02/2017 9:59:03 AM   Radiology Dg Chest 2 View  Result Date: 10/02/2017 CLINICAL DATA:  Chest pain EXAM: CHEST - 2 VIEW COMPARISON:  October 06, 2015 FINDINGS: Lungs are clear. Heart size and pulmonary vascularity are normal. No adenopathy. No pneumothorax. No bone lesions. IMPRESSION: No edema or consolidation. Electronically Signed   By: Bretta Bang III M.D.   On: 10/02/2017 09:32    Procedures Procedures (including critical care time)  Medications Ordered in ED Medications  ketorolac (TORADOL) 30 MG/ML injection 30 mg (has no administration in time range)  gi cocktail (Maalox,Lidocaine,Donnatal) (30 mLs Oral Given 10/02/17 1222)  HYDROcodone-acetaminophen (NORCO/VICODIN) 5-325 MG per tablet 1 tablet (1 tablet Oral Given 10/02/17 1330)     Initial Impression  / Assessment and Plan / ED Course  I have reviewed the triage vital signs and the nursing notes.  Pertinent labs & imaging results that were available during my care of the patient were reviewed by me and considered in my medical decision making (see chart for details).     Patient's chest pain is been constant since 8:20.  She states she was folding laundry when it started.  The patient will be treated for chest wall pain based on her HPI and physical exam findings.  She does have significantly reproducible pain on exam.  Patient is advised to return here as needed.  Patient is advised to follow-up with a primary doctor.  Told return here as needed.  Patient is low risk based on well's criteria for PE and has a negative d-dimer.  She has  2 sets of negative cardiac enzymes and no acute abnormalities noted on her EKG.  Final Clinical Impressions(s) / ED Diagnoses   Final diagnoses:  None    ED Discharge Orders    None       Charlestine NightLawyer, Sherea Liptak, PA-C 10/02/17 1522    Nira Connardama, Pedro Eduardo, MD 10/02/17 2131

## 2017-10-02 NOTE — ED Notes (Signed)
Phlebotomy at bedside.

## 2018-01-16 ENCOUNTER — Emergency Department (HOSPITAL_COMMUNITY): Payer: Self-pay

## 2018-01-16 ENCOUNTER — Emergency Department (HOSPITAL_COMMUNITY)
Admission: EM | Admit: 2018-01-16 | Discharge: 2018-01-17 | Disposition: A | Discharge status: Home / Self Care | Payer: Self-pay | Attending: Emergency Medicine | Admitting: Emergency Medicine

## 2018-01-16 ENCOUNTER — Encounter (HOSPITAL_COMMUNITY): Payer: Self-pay | Admitting: Emergency Medicine

## 2018-01-16 ENCOUNTER — Other Ambulatory Visit: Payer: Self-pay

## 2018-01-16 DIAGNOSIS — J45909 Unspecified asthma, uncomplicated: Secondary | ICD-10-CM | POA: Insufficient documentation

## 2018-01-16 DIAGNOSIS — F17210 Nicotine dependence, cigarettes, uncomplicated: Secondary | ICD-10-CM | POA: Insufficient documentation

## 2018-01-16 DIAGNOSIS — Z79899 Other long term (current) drug therapy: Secondary | ICD-10-CM | POA: Insufficient documentation

## 2018-01-16 DIAGNOSIS — G8929 Other chronic pain: Secondary | ICD-10-CM | POA: Insufficient documentation

## 2018-01-16 DIAGNOSIS — M5441 Lumbago with sciatica, right side: Secondary | ICD-10-CM | POA: Insufficient documentation

## 2018-01-16 MED ORDER — HYDROMORPHONE HCL 1 MG/ML IJ SOLN
1.0000 mg | Freq: Once | INTRAMUSCULAR | Status: AC
  Administered 2018-01-16: 1 mg via INTRAMUSCULAR
  Filled 2018-01-16: qty 1

## 2018-01-16 MED ORDER — DEXAMETHASONE SODIUM PHOSPHATE 10 MG/ML IJ SOLN
10.0000 mg | Freq: Once | INTRAMUSCULAR | Status: AC
  Administered 2018-01-16: 10 mg via INTRAMUSCULAR
  Filled 2018-01-16: qty 1

## 2018-01-16 MED ORDER — HYDROMORPHONE HCL 1 MG/ML IJ SOLN
1.0000 mg | Freq: Once | INTRAMUSCULAR | Status: AC
  Administered 2018-01-16: 1 mg via INTRAVENOUS
  Filled 2018-01-16: qty 1

## 2018-01-16 NOTE — ED Notes (Signed)
Pt returned from MRI without having scan completed. Pt was brought back to the ED to change out of her clothes and remove her piercings. Pt family member visibly upset about the long wait.

## 2018-01-16 NOTE — ED Notes (Signed)
Patient transported to MRI 

## 2018-01-16 NOTE — ED Notes (Signed)
Patient transported to CT 

## 2018-01-16 NOTE — ED Provider Notes (Addendum)
[facility] EMERGENCY DEPARTMENT Provider Note   CSN: [id number] Arrival date & time: 01/16/18  1325     History   Chief Complaint Chief Complaint  Patient presents with  . Back Pain    HPI [name] is a 37 y.o. female.  HPI Patient has prior history of lumbar laminectomy.  2 years ago.  She reports that she was better after surgery.  For about a year she does get on and off problems with back pain and right-sided leg pain.  Usually she can rest and after a while it just goes away and she is back to normal activity.  For 4 days however pain has been persistent and severe.  Any movement of the leg makes severe pain in her lower back.  Reports that the right leg has been numb now. Past Medical History:  Diagnosis Date  . Anemia   . Asthma   . Erosive esophagitis    distal esophagus, with low grade bleed 03/2009  . GERD (gastroesophageal reflux disease)   . Headache(784.0)   . History of migraine headaches   . IBS (irritable bowel syndrome)   . Infection    UTI    Patient Active Problem List   Diagnosis Date Noted  . HNP (herniated nucleus pulposus), lumbar 01/05/2016  . Depo contraception 02/17/2015  . Chronic back pain greater than 3 months duration 12/14/2014  . Normal labor 06/12/2014  . Flu vaccine need 11/03/2013  . Hyperemesis complicating pregnancy, antepartum 11/03/2013  . Healthcare maintenance 01/13/2013  . Seasonal allergies 01/13/2013  . Mesenteric adenitis 02/05/2012  . Pes planus (flat feet) 06/11/2011  . Reflux esophagitis 03/28/2009  . Asthma, mild intermittent, well-controlled 03/11/2009    Past Surgical History:  Procedure Laterality Date  . LUMBAR LAMINECTOMY/DECOMPRESSION MICRODISCECTOMY Right 01/05/2016   Procedure: Right Lumbar three-four Microdiskectomy;  Surgeon: Coletta Memos, MD;  Location: [facility];  Service: Neurosurgery;  Laterality: Right;  right  . NO PAST SURGERIES       OB History    Gravida  3   Para  3   Term  3   Preterm      AB      Living  3     SAB      TAB      Ectopic      Multiple  0   Live Births  3            Home Medications    Prior to Admission medications   Medication Sig Start Date End Date Taking? Authorizing Provider  medroxyPROGESTERone Acetate 150 MG/ML SUSY Inject 150 mLs into the skin every 3 (three) months. 08/12/17  Yes [provider]  acetaminophen-codeine (TYLENOL #3) 300-30 MG tablet Take 1-2 tablets by mouth every 6 (six) hours as needed for moderate pain. 01/17/18   Arby Barrette, MD  orphenadrine (NORFLEX) 100 MG tablet Take 1 tablet (100 mg total) by mouth 2 (two) times daily. 01/17/18   Arby Barrette, MD  traMADol (ULTRAM) 50 MG tablet Take 1 tablet (50 mg total) by mouth every 6 (six) hours as needed for severe pain. Patient not taking: Reported on 01/16/2018 10/02/17   Charlestine Night, PA-C    Family History Family History  Problem Relation Age of Onset  . Migraines Mother   . Asthma Mother   . Migraines Father   . Heart disease Father   . Asthma Daughter   . Cancer Maternal Grandmother     Social  History Social History   Tobacco Use  . Smoking status: Light Tobacco Smoker  . Smokeless tobacco: Never Used  Substance Use Topics  . Alcohol use: No    Comment: occasional on holidays  . Drug use: No     Allergies   Ibuprofen   Review of Systems Review of Systems 10 Systems reviewed and are negative for acute change except as noted in the HPI.   Physical Exam Updated Vital Signs BP (!) 141/83 (BP Location: Left Arm)   Pulse 66   Temp 98.2 F (36.8 C) (Oral)   Resp 14   SpO2 100%   Physical Exam  Constitutional: She is oriented to person, place, and time. She appears well-developed and well-nourished.  HENT:  Head: Normocephalic and atraumatic.  Eyes: Pupils are equal, round, and reactive to light. EOM are normal.  Neck: Neck supple.  Cardiovascular: Normal rate, regular rhythm,  normal heart sounds and intact distal pulses.  Pulmonary/Chest: Effort normal and breath sounds normal.  Abdominal: Soft. Bowel sounds are normal. She exhibits no distension. There is no tenderness.  Musculoskeletal: She exhibits tenderness. She exhibits no edema.  Of ear pain was trying to go from supine to sitting upright in stretcher.  This localizes into the lumbar spine.  Patient is able to roll onto her side.  She endorses pain to palpation over the lumbar spine from about L3-L5.  Endorses pain with straight leg raise on the right.  Dorsa decreased sensation to light touch on the right lower extremity.  Patient can plantar flex against resistance on the right.  She can elevate the leg but not hold very well against resistance.  Neurological: She is alert and oriented to person, place, and time. She has normal strength. Coordination normal. GCS eye subscore is 4. GCS verbal subscore is 5. GCS motor subscore is 6.  Skin: Skin is warm, dry and intact.  Psychiatric: She has a normal mood and affect.     ED Treatments / Results  Labs (all labs ordered are listed, but only abnormal results are displayed) Labs Reviewed - No data to display  EKG None  Radiology Mr Lumbar Spine Wo Contrast  Result Date: 01/17/2018 CLINICAL DATA:  37 y/o F; back pain radiating into the right leg. History of right L3-4 hemilaminectomy. EXAM: MRI LUMBAR SPINE WITHOUT CONTRAST TECHNIQUE: Multiplanar, multisequence MR imaging of the lumbar spine was performed. No intravenous contrast was administered. COMPARISON:  06/20/2016 and 12/21/2015 lumbar spine MRI. FINDINGS: Segmentation:  Standard. Alignment:  Physiologic. Vertebrae:  No fracture, evidence of discitis, or bone lesion. Conus medullaris and cauda equina: Conus extends to the L1-2 level. Conus and cauda equina appear normal. Paraspinal and other soft tissues: Negative. Disc levels: L1-2: No significant disc displacement, foraminal stenosis, or canal stenosis.  L2-3: Stable mild disc bulge. No significant foraminal or canal stenosis. L3-4: Postsurgical changes related to a right laminectomy and microdiscectomy. No recurrent disc protrusion. Stable mild disc bulge. No foraminal or canal stenosis. L4-5: Stable mild disc bulge eccentric to the left and central annular fissure. Mild left foraminal stenosis. No canal stenosis. L5-S1: No significant disc displacement, foraminal stenosis, or canal stenosis. IMPRESSION: 1. No acute osseous abnormality or malalignment. 2. Stable postsurgical changes related to right L3-4 hemilaminectomy and microdiscectomy. No recurrent disc protrusion. 3. Stable mild lumbar spondylosis greatest at the L4-5 level where there is mild left foraminal stenosis. No high-grade foraminal or canal stenosis. Electronically Signed   By: Mitzi Hansen M.D.   On: 01/17/2018  00:15    Procedures Procedures (including critical care time)  Medications Ordered in ED Medications  HYDROcodone-acetaminophen (NORCO/VICODIN) 5-325 MG per tablet 2 tablet (has no administration in time range)  dexamethasone (DECADRON) injection 10 mg (10 mg Intramuscular Given 01/16/18 1741)  HYDROmorphone (DILAUDID) injection 1 mg (1 mg Intramuscular Given 01/16/18 1737)  HYDROmorphone (DILAUDID) injection 1 mg (1 mg Intravenous Given 01/16/18 2221)     Initial Impression / Assessment and Plan / ED Course  I have reviewed the triage vital signs and the nursing notes.  Pertinent labs & imaging results that were available during my care of the patient were reviewed by me and considered in my medical decision making (see chart for details).      Final Clinical Impressions(s) / ED Diagnoses   Final diagnoses:  Acute midline low back pain with right-sided sciatica   Describes radiculopathy with numbness of the right lower extremity.  She had prior history of back surgery.  She does have use of the lower extremity.  MRI does not show any compression.  At  this time I feel patient stable for discharge with close follow-up with her neurosurgeon and/or PCP.  Is given a dose of Decadron in the emergency department.  She is to continue some as needed tylenol  with codeine (she reports she does not tolerate Vicodin and she has tolerated this in the past) and Norflex with close follow-up.  Administered in the Emergency Department. ED Discharge Orders        Ordered    HYDROcodone-acetaminophen (NORCO/VICODIN) 5-325 MG tablet  Every 4 hours PRN,   Status:  Discontinued     01/17/18 0021    orphenadrine (NORFLEX) 100 MG tablet  2 times daily     01/17/18 0021    acetaminophen-codeine (TYLENOL #3) 300-30 MG tablet  Every 6 hours PRN     01/17/18 0031       Arby BarrettePfeiffer, Malahki Gasaway, MD 01/17/18 Sable [id number]    Charliee Krenz, MD 01/17/18 [id number]

## 2018-01-16 NOTE — ED Triage Notes (Signed)
Patient complains of back pain for th last few days radiating into her right leg. States she had the same issue two years ago and it was relieved by surgery. Denies recent injury, states she woke up two days ago and the pain had returned. Patient alert, oriented, and in no apparent distress at this time.

## 2018-01-17 MED ORDER — HYDROCODONE-ACETAMINOPHEN 5-325 MG PO TABS: 1.0000 | ORAL_TABLET | ORAL | 0 refills | Status: DC | PRN

## 2018-01-17 MED ORDER — ORPHENADRINE CITRATE ER 100 MG PO TB12: 100.0000 mg | ORAL_TABLET | Freq: Two times a day (BID) | ORAL | 0 refills | Status: DC

## 2018-01-17 MED ORDER — ACETAMINOPHEN-CODEINE #3 300-30 MG PO TABS: 1.0000 | ORAL_TABLET | Freq: Four times a day (QID) | ORAL | 0 refills | Status: DC | PRN

## 2018-01-17 MED ORDER — HYDROCODONE-ACETAMINOPHEN 5-325 MG PO TABS: 2.0000 | ORAL_TABLET | Freq: Once | ORAL | Status: DC

## 2018-01-17 NOTE — Discharge Instructions (Addendum)
1.  Use the referral number in your discharge instructions to help find a family doctor.  Family doctor can help you with referrals to physical therapy, neurosurgery or pain management if needed.

## 2018-07-18 ENCOUNTER — Ambulatory Visit (INDEPENDENT_AMBULATORY_CARE_PROVIDER_SITE_OTHER): Payer: Medicaid Other

## 2018-07-18 ENCOUNTER — Encounter (HOSPITAL_COMMUNITY): Payer: Self-pay

## 2018-07-18 ENCOUNTER — Ambulatory Visit (HOSPITAL_COMMUNITY)
Admission: EM | Admit: 2018-07-18 | Discharge: 2018-07-18 | Disposition: A | Discharge status: Home / Self Care | Payer: Medicaid Other | Attending: Internal Medicine | Admitting: Internal Medicine

## 2018-07-18 DIAGNOSIS — R0789 Other chest pain: Secondary | ICD-10-CM | POA: Diagnosis not present

## 2018-07-18 DIAGNOSIS — K219 Gastro-esophageal reflux disease without esophagitis: Secondary | ICD-10-CM

## 2018-07-18 DIAGNOSIS — M940 Chondrocostal junction syndrome [Tietze]: Secondary | ICD-10-CM

## 2018-07-18 MED ORDER — ALUM & MAG HYDROXIDE-SIMETH 200-200-20 MG/5ML PO SUSP
30.0000 mL | Freq: Once | ORAL | Status: AC
  Administered 2018-07-18: 30 mL via ORAL

## 2018-07-18 MED ORDER — ALUM & MAG HYDROXIDE-SIMETH 200-200-20 MG/5ML PO SUSP
ORAL | Status: AC
  Filled 2018-07-18: qty 30

## 2018-07-18 MED ORDER — LIDOCAINE VISCOUS HCL 2 % MT SOLN
OROMUCOSAL | Status: AC
  Filled 2018-07-18: qty 15

## 2018-07-18 MED ORDER — KETOROLAC TROMETHAMINE 60 MG/2ML IM SOLN
60.0000 mg | Freq: Once | INTRAMUSCULAR | Status: AC
  Administered 2018-07-18: 60 mg via INTRAMUSCULAR

## 2018-07-18 MED ORDER — KETOROLAC TROMETHAMINE 60 MG/2ML IM SOLN
INTRAMUSCULAR | Status: AC
  Filled 2018-07-18: qty 2

## 2018-07-18 MED ORDER — OMEPRAZOLE 20 MG PO CPDR: 20.0000 mg | DELAYED_RELEASE_CAPSULE | Freq: Every day | ORAL | 0 refills | Status: DC

## 2018-07-18 MED ORDER — LIDOCAINE VISCOUS HCL 2 % MT SOLN
15.0000 mL | Freq: Once | OROMUCOSAL | Status: AC
  Administered 2018-07-18: 15 mL via ORAL

## 2018-07-18 MED ORDER — PREDNISONE 10 MG PO TABS: 20.0000 mg | ORAL_TABLET | Freq: Every day | ORAL | 0 refills | Status: DC

## 2018-07-18 NOTE — Discharge Instructions (Addendum)
Starting tomorrow, start on the prednisone.  Follow up with your family Dr in 7-10 days.  Stay on the Prilosec til your family Dr tell you when to stop or if your see the gastroenterologist.

## 2018-07-18 NOTE — ED Triage Notes (Signed)
Pt presents with left chest pain with no complaints of dizziness, weakness, or shortness of breath.

## 2018-07-18 NOTE — ED Provider Notes (Signed)
[facility]    CSN: [id number] Arrival date & time: 07/18/18  0807     History   Chief Complaint Chief Complaint  Patient presents with  . Chest Pain    HPI [name] is a 38 y.o. female.   Onset of gradual chest pain off and on last week. Denies heavy lifting or hitting her chest on anything or with anything.  Yesterday pm the pain hit her under her R breast  And R top chest and has not resolved. She took Tylenol and eased a little. She went to ER at 3 am, but the wait was too long and left since she had to get her kids to the bus for school.  She describes the pain  as pressure which has been constants since 6 pm yesterday. Pain gets Worse to breath, laying down on her back helped a little. Leaning forward does not make it worse. Denies coughing or being SOB. She has not been sick in the past month. Has not been on any long car rides or flights. Denies calf pain. Has past history of esophageal bleed 2012, but has not taken any medication for this in a few years. When she gets heart burn she just drinks milk which makes it resolve. Denies trouble swallowing.    Past Medical History:  Diagnosis Date  . Anemia   . Asthma   . Erosive esophagitis    distal esophagus, with low grade bleed 03/2009  . GERD (gastroesophageal reflux disease)   . Headache(784.0)   . History of migraine headaches   . IBS (irritable bowel syndrome)   . Infection    UTI    Patient Active Problem List   Diagnosis Date Noted  . HNP (herniated nucleus pulposus), lumbar 01/05/2016  . Depo contraception 02/17/2015  . Chronic back pain greater than 3 months duration 12/14/2014  . Normal labor 06/12/2014  . Flu vaccine need 11/03/2013  . Hyperemesis complicating pregnancy, antepartum 11/03/2013  . Healthcare maintenance 01/13/2013  . Seasonal allergies 01/13/2013  . Mesenteric adenitis 02/05/2012  . Pes planus (flat feet) 06/11/2011  . Reflux esophagitis 03/28/2009  . Asthma, mild  intermittent, well-controlled 03/11/2009    Past Surgical History:  Procedure Laterality Date  . LUMBAR LAMINECTOMY/DECOMPRESSION MICRODISCECTOMY Right 01/05/2016   Procedure: Right Lumbar three-four Microdiskectomy;  Surgeon: Coletta Memos, MD;  Location: [facility];  Service: Neurosurgery;  Laterality: Right;  right  . NO PAST SURGERIES      OB History    Gravida  3   Para  3   Term  3   Preterm      AB      Living  3     SAB      TAB      Ectopic      Multiple  0   Live Births  3            Home Medications    Prior to Admission medications   Medication Sig Start Date End Date Taking? Authorizing Provider  acetaminophen-codeine (TYLENOL #3) 300-30 MG tablet Take 1-2 tablets by mouth every 6 (six) hours as needed for moderate pain. 01/17/18   Arby Barrette, MD  medroxyPROGESTERone Acetate 150 MG/ML SUSY Inject 150 mLs into the skin every 3 (three) months. 08/12/17   [provider]  omeprazole (PRILOSEC) 20 MG capsule Take 1 capsule (20 mg total) by mouth daily. 07/18/18   Rodriguez-Southworth, Nettie Elm, PA-C  orphenadrine (NORFLEX) 100 MG tablet Take  1 tablet (100 mg total) by mouth 2 (two) times daily. 01/17/18   Arby BarrettePfeiffer, Marcy, MD  predniSONE (DELTASONE) 10 MG tablet Take 2 tablets (20 mg total) by mouth daily. 07/18/18   Rodriguez-Southworth, Nettie ElmSylvia, PA-C    Family History Family History  Problem Relation Age of Onset  . Migraines Mother   . Asthma Mother   . Migraines Father   . Heart disease Father   . Asthma Daughter   . Cancer Maternal Grandmother     Social History Social History   Tobacco Use  . Smoking status: Light Tobacco Smoker  . Smokeless tobacco: Never Used  Substance Use Topics  . Alcohol use: No    Comment: occasional on holidays  . Drug use: No     Allergies   Ibuprofen   Review of Systems Review of Systems  Constitutional: Negative for chills, diaphoresis and fever.  HENT: Negative.   Respiratory: Positive  for chest tightness. Negative for cough, shortness of breath and wheezing.   Cardiovascular: Positive for chest pain. Negative for palpitations and leg swelling.  Gastrointestinal: Negative for abdominal distention, abdominal pain, blood in stool, constipation, diarrhea, nausea and vomiting.       Has GERD and drinks mild when symptoms bother her. Has past hx of esophageal bleed 2012 and has not taken medication for a few years.   Genitourinary: Negative for difficulty urinating, flank pain and frequency.  Musculoskeletal: Positive for back pain.       Has chronic back pain  Skin: Negative for rash.  Neurological: Negative for dizziness, syncope, numbness and headaches.  Hematological: Negative for adenopathy.     Physical Exam Triage Vital Signs ED Triage Vitals  Enc Vitals Group     BP 07/18/18 0850 (!) 151/92     Pulse Rate 07/18/18 0850 75     Resp 07/18/18 0850 18     Temp 07/18/18 0850 97.9 F (36.6 C)     Temp Source 07/18/18 0850 Oral     SpO2 07/18/18 0850 100 %     Weight --      Height --      Head Circumference --      Peak Flow --      Pain Score 07/18/18 0851 9     Pain Loc --      Pain Edu? --      Excl. in GC? --    No data found. Repeated BP 127/88  Updated Vital Signs BP (!) 151/92 (BP Location: Left Arm)   Pulse 75   Temp 97.9 F (36.6 C) (Oral)   Resp 18   SpO2 100%   Visual Acuity Right Eye Distance:   Left Eye Distance:   Bilateral Distance:    Right Eye Near:   Left Eye Near:    Bilateral Near:     Physical Exam Constitutional:      General: She is in acute distress.     Appearance: She is well-developed. She is obese. She is not ill-appearing, toxic-appearing or diaphoretic.     Comments: Seemed in pain and was tearful  HENT:     Head: Normocephalic.  Eyes:     Pupils: Pupils are equal, round, and reactive to light.  Neck:     Musculoskeletal: Neck supple.     Thyroid: No thyromegaly.  Cardiovascular:     Rate and Rhythm: Normal  rate and regular rhythm.     Heart sounds: Normal heart sounds. No murmur.  Pulmonary:  Effort: Pulmonary effort is normal. No respiratory distress.     Breath sounds: Normal breath sounds.  Chest:     Chest wall: Tenderness present.     Comments: Has tenderness on L upper chest and below breast region similar to the pain she has been experiencing Abdominal:     General: Bowel sounds are normal.     Palpations: Abdomen is soft. There is no hepatomegaly, splenomegaly or mass.     Tenderness: There is no abdominal tenderness.  Musculoskeletal: Normal range of motion.  Lymphadenopathy:     Cervical: No cervical adenopathy.  Skin:    General: Skin is warm and dry.     Findings: No erythema.  Neurological:     General: No focal deficit present.     Mental Status: She is alert and oriented to person, place, and time.  Psychiatric:        Behavior: Behavior normal.     Comments: tearful      UC Treatments / Results  Labs (all labs ordered are listed, but only abnormal results are displayed) Labs Reviewed - No data to display  EKG normal  Radiology Dg Chest 2 View  Result Date: 07/18/2018 CLINICAL DATA:  Chest pain. EXAM: CHEST - 2 VIEW COMPARISON:  10/02/2017 FINDINGS: The heart size and mediastinal contours are within normal limits. Both lungs are clear. The visualized skeletal structures are unremarkable. IMPRESSION: Normal exam. Electronically Signed   By: Francene Boyers M.D.   On: 07/18/2018 09:24    Procedures Procedures  Medications Ordered in UC Medications  alum & mag hydroxide-simeth (MAALOX/MYLANTA) 200-200-20 MG/5ML suspension 30 mL (30 mLs Oral Given 07/18/18 0917)    And  lidocaine (XYLOCAINE) 2 % viscous mouth solution 15 mL (15 mLs Oral Given 07/18/18 0917)  ketorolac (TORADOL) injection 60 mg (60 mg Intramuscular Given 07/18/18 0950)    Initial Impression / Assessment and Plan / UC Course  I have reviewed the triage vital signs and the nursing  notes. Pertinent imaging results that were available during my care of the patient were reviewed by me and considered in my medical decision making (see chart for details). GI cocktail given in office and helped her pain by 20%, she did not seem in much distress as when I initially walked in the room and she was crying. She seemed comfortably texting on her phone. I ordered Toradol 60 mg IM for pain, and may start prednisone tomorrow.  She was also advised to try ice and heat on her chest wall area.    Final Clinical Impressions(s) / UC Diagnoses   Final diagnoses:  Costochondritis  Gastroesophageal reflux disease without esophagitis     Discharge Instructions     Starting tomorrow, start on the prednisone.  Follow up with your family Dr in 7-10 days.  Stay on the Prilosec til your family Dr tell you when to stop or if your see the gastroenterologist.     ED Prescriptions    Medication Sig Dispense Auth. Provider   omeprazole (PRILOSEC) 20 MG capsule Take 1 capsule (20 mg total) by mouth daily. 30 capsule Rodriguez-Southworth, Nettie Elm, PA-C   predniSONE (DELTASONE) 10 MG tablet Take 2 tablets (20 mg total) by mouth daily. 10 tablet Rodriguez-Southworth, Nettie Elm, PA-C     Controlled Substance Prescriptions [place] Controlled Substance Registry consulted?    Garey Ham, PA-C 07/18/18 2252

## 2018-08-19 ENCOUNTER — Other Ambulatory Visit (HOSPITAL_COMMUNITY): Payer: Self-pay | Admitting: Internal Medicine

## 2018-12-23 ENCOUNTER — Other Ambulatory Visit: Payer: Self-pay

## 2018-12-23 ENCOUNTER — Encounter (HOSPITAL_COMMUNITY): Payer: Self-pay | Admitting: Emergency Medicine

## 2018-12-23 ENCOUNTER — Emergency Department (HOSPITAL_COMMUNITY)
Admission: EM | Admit: 2018-12-23 | Discharge: 2018-12-23 | Disposition: A | Discharge status: Home / Self Care | Payer: Medicaid Other | Attending: Emergency Medicine | Admitting: Emergency Medicine

## 2018-12-23 DIAGNOSIS — J45909 Unspecified asthma, uncomplicated: Secondary | ICD-10-CM | POA: Insufficient documentation

## 2018-12-23 DIAGNOSIS — Z79899 Other long term (current) drug therapy: Secondary | ICD-10-CM | POA: Diagnosis not present

## 2018-12-23 DIAGNOSIS — F17200 Nicotine dependence, unspecified, uncomplicated: Secondary | ICD-10-CM | POA: Insufficient documentation

## 2018-12-23 DIAGNOSIS — M545 Low back pain: Secondary | ICD-10-CM | POA: Diagnosis present

## 2018-12-23 DIAGNOSIS — M5441 Lumbago with sciatica, right side: Secondary | ICD-10-CM | POA: Diagnosis not present

## 2018-12-23 MED ORDER — CYCLOBENZAPRINE HCL 10 MG PO TABS: 10.0000 mg | ORAL_TABLET | Freq: Three times a day (TID) | ORAL | 0 refills | Status: DC | PRN

## 2018-12-23 MED ORDER — LIDOCAINE 5 % EX PTCH: 1.0000 | MEDICATED_PATCH | CUTANEOUS | 0 refills | Status: DC

## 2018-12-23 MED ORDER — HYDROMORPHONE HCL 2 MG/ML IJ SOLN
2.0000 mg | Freq: Once | INTRAMUSCULAR | Status: AC
  Administered 2018-12-23: 13:00:00 2 mg via INTRAMUSCULAR
  Filled 2018-12-23: qty 1

## 2018-12-23 MED ORDER — HYDROMORPHONE HCL 4 MG PO TABS: 4.0000 mg | ORAL_TABLET | ORAL | 0 refills | Status: DC | PRN

## 2018-12-23 NOTE — ED Notes (Signed)
Bed: WA07 Expected date:  Expected time:  Means of arrival:  Comments: EMS 38 yo back pain, numbness down leg x 4 days

## 2018-12-23 NOTE — ED Triage Notes (Signed)
Per EMS, patient from home, c/o lower back pain x3 days after "twisting while sitting down." States pain radiating down right leg.  Hx back surgery 2017.

## 2018-12-23 NOTE — ED Notes (Signed)
ED Provider at bedside. 

## 2018-12-23 NOTE — ED Provider Notes (Signed)
[facility]-EMERGENCY DEPT Provider Note   CSN: [id number] Arrival date & time: 12/23/18  1134    History   Chief Complaint Chief Complaint  Patient presents with  . Back Pain    HPI [name] is a 38 y.o. female.     HPI  38 year old female presents with severe low back pain.  Started 3 days ago.  She stood up and all of a sudden had pain.  Is been radiating down her right thigh to her knee.  It is posterior and lateral.  She also reports associated numbness.  Her leg feels heavy.  No incontinence of her bowels or bladder.  No saddle anesthesia.  She denies any fevers.  She is tried Tylenol, Voltaren gel, and heating pad with no relief.  The pain feels sharp.  Feels like when she needed surgery in 2017 by Dr. Franky Machoabbell.  Past Medical History:  Diagnosis Date  . Anemia   . Asthma   . Erosive esophagitis    distal esophagus, with low grade bleed 03/2009  . GERD (gastroesophageal reflux disease)   . Headache(784.0)   . History of migraine headaches   . IBS (irritable bowel syndrome)   . Infection    UTI    Patient Active Problem List   Diagnosis Date Noted  . HNP (herniated nucleus pulposus), lumbar 01/05/2016  . Depo contraception 02/17/2015  . Chronic back pain greater than 3 months duration 12/14/2014  . Normal labor 06/12/2014  . Flu vaccine need 11/03/2013  . Hyperemesis complicating pregnancy, antepartum 11/03/2013  . Healthcare maintenance 01/13/2013  . Seasonal allergies 01/13/2013  . Mesenteric adenitis 02/05/2012  . Pes planus (flat feet) 06/11/2011  . Reflux esophagitis 03/28/2009  . Asthma, mild intermittent, well-controlled 03/11/2009    Past Surgical History:  Procedure Laterality Date  . LUMBAR LAMINECTOMY/DECOMPRESSION MICRODISCECTOMY Right 01/05/2016   Procedure: Right Lumbar three-four Microdiskectomy;  Surgeon: Coletta MemosKyle Cabbell, MD;  Location: [facility];  Service: Neurosurgery;  Laterality: Right;  right  . NO PAST  SURGERIES       OB History    Gravida  3   Para  3   Term  3   Preterm      AB      Living  3     SAB      TAB      Ectopic      Multiple  0   Live Births  3            Home Medications    Prior to Admission medications   Medication Sig Start Date End Date Taking? Authorizing Provider  acetaminophen (TYLENOL) 500 MG tablet Take 500 mg by mouth every 6 (six) hours as needed for mild pain.   Yes [provider]  medroxyPROGESTERone Acetate 150 MG/ML SUSY Inject 150 mLs into the skin every 3 (three) months. 08/12/17  Yes [provider]  acetaminophen-codeine (TYLENOL #3) 300-30 MG tablet Take 1-2 tablets by mouth every 6 (six) hours as needed for moderate pain. Patient not taking: Reported on 12/23/2018 01/17/18   Arby BarrettePfeiffer, Marcy, MD  cyclobenzaprine (FLEXERIL) 10 MG tablet Take 1 tablet (10 mg total) by mouth 3 (three) times daily as needed for muscle spasms. 12/23/18   Pricilla LovelessGoldston, Traniya Prichett, MD  HYDROmorphone (DILAUDID) 4 MG tablet Take 1 tablet (4 mg total) by mouth every 4 (four) hours as needed for severe pain. 12/23/18   Pricilla LovelessGoldston, Omelia Marquart, MD  lidocaine (LIDODERM) 5 % Place 1 patch  onto the skin daily. Remove & Discard patch within 12 hours or as directed by MD 12/23/18   Sherwood Gambler, MD  omeprazole (PRILOSEC) 20 MG capsule Take 1 capsule (20 mg total) by mouth daily. Patient not taking: Reported on 12/23/2018 07/18/18   Rodriguez-Southworth, Sunday Spillers, PA-C  orphenadrine (NORFLEX) 100 MG tablet Take 1 tablet (100 mg total) by mouth 2 (two) times daily. Patient not taking: Reported on 12/23/2018 01/17/18   Charlesetta Shanks, MD  predniSONE (DELTASONE) 10 MG tablet Take 2 tablets (20 mg total) by mouth daily. Patient not taking: Reported on 12/23/2018 07/18/18   Rodriguez-Southworth, Sunday Spillers, PA-C    Family History Family History  Problem Relation Age of Onset  . Migraines Mother   . Asthma Mother   . Migraines Father   . Heart disease Father   . Asthma  Daughter   . Cancer Maternal Grandmother     Social History Social History   Tobacco Use  . Smoking status: Light Tobacco Smoker  . Smokeless tobacco: Never Used  Substance Use Topics  . Alcohol use: No    Comment: occasional on holidays  . Drug use: No     Allergies   Ibuprofen   Review of Systems Review of Systems  Constitutional: Negative for fever.  Gastrointestinal: Negative for abdominal pain.  Genitourinary: Negative for dysuria and menstrual problem.  Musculoskeletal: Positive for back pain.  Neurological: Positive for numbness.  All other systems reviewed and are negative.    Physical Exam Updated Vital Signs BP 135/70 (BP Location: Left Arm)   Pulse 74   Temp 98.6 F (37 C) (Oral)   Resp 19   SpO2 100%   Physical Exam Vitals signs and nursing note reviewed.  Constitutional:      Appearance: She is well-developed. She is not diaphoretic.  HENT:     Head: Normocephalic and atraumatic.     Right Ear: External ear normal.     Left Ear: External ear normal.     Nose: Nose normal.  Eyes:     General:        Right eye: No discharge.        Left eye: No discharge.  Cardiovascular:     Rate and Rhythm: Normal rate and regular rhythm.     Pulses:          Dorsalis pedis pulses are 2+ on the right side and 2+ on the left side.  Pulmonary:     Effort: Pulmonary effort is normal.     Breath sounds: Normal breath sounds.  Abdominal:     Palpations: Abdomen is soft.     Tenderness: There is no abdominal tenderness.  Musculoskeletal:     Lumbar back: She exhibits tenderness.       Back:  Skin:    General: Skin is warm and dry.  Neurological:     Mental Status: She is alert.     Comments: Subjective decreased sensation to right lateral thigh. 5/5 strength in LLE. 4/5 strength in RLE but exam is limited by severe pain.  Psychiatric:        Mood and Affect: Mood is not anxious.      ED Treatments / Results  Labs (all labs ordered are listed, but  only abnormal results are displayed) Labs Reviewed - No data to display  EKG None  Radiology No results found.  Procedures Procedures (including critical care time)  Medications Ordered in ED Medications  HYDROmorphone (DILAUDID) injection 2 mg (2 mg Intramuscular  Given 12/23/18 1230)     Initial Impression / Assessment and Plan / ED Course  I have reviewed the triage vital signs and the nursing notes.  Pertinent labs & imaging results that were available during my care of the patient were reviewed by me and considered in my medical decision making (see chart for details).        After being given IM dilaudid, patient is moving her right leg much better.  She is able to hold it up against resistance, 5/5 strength.  I think originally her "weakness" was from severe pain.  While she does report some subjective numbness, she does not have any other concerning findings such as fever, bowel/bladder incontinence or weakness.  I think this is recurrent sciatica and she does have some chronic back pain issues but I do not think she needs an emergent MRI.  My suspicion of cauda equina, osteomyelitis, etc. is very low.  I have discussed strict return precautions and will give short course of pain control, muscle relaxer.  Advised to continue to use Tylenol.  She can use ibuprofen based on prior stomach issues.  We will also prescribe Lidoderm.  Follow-up with Dr. Franky Machoabbell.  Final Clinical Impressions(s) / ED Diagnoses   Final diagnoses:  Acute right-sided low back pain with right-sided sciatica    ED Discharge Orders         Ordered    HYDROmorphone (DILAUDID) 4 MG tablet  Every 4 hours PRN     12/23/18 1316    cyclobenzaprine (FLEXERIL) 10 MG tablet  3 times daily PRN     12/23/18 1316    lidocaine (LIDODERM) 5 %  Every 24 hours     12/23/18 1316           Pricilla LovelessGoldston, Terin Cragle, MD 12/23/18 1331

## 2018-12-23 NOTE — Discharge Instructions (Addendum)
If you develop worsening, recurrent, or continued back pain, numbness or weakness in the legs, incontinence of your bowels or bladders, numbness of your buttocks, fever, abdominal pain, or any other new/concerning symptoms then return to the ER for evaluation.  

## 2019-01-05 ENCOUNTER — Emergency Department (HOSPITAL_COMMUNITY): Payer: Medicaid Other

## 2019-01-05 ENCOUNTER — Encounter (HOSPITAL_COMMUNITY): Payer: Self-pay

## 2019-01-05 ENCOUNTER — Emergency Department (HOSPITAL_COMMUNITY)
Admission: EM | Admit: 2019-01-05 | Discharge: 2019-01-05 | Disposition: A | Discharge status: Home / Self Care | Payer: Medicaid Other | Attending: Emergency Medicine | Admitting: Emergency Medicine

## 2019-01-05 ENCOUNTER — Other Ambulatory Visit: Payer: Self-pay

## 2019-01-05 DIAGNOSIS — Y998 Other external cause status: Secondary | ICD-10-CM | POA: Insufficient documentation

## 2019-01-05 DIAGNOSIS — S39012A Strain of muscle, fascia and tendon of lower back, initial encounter: Secondary | ICD-10-CM | POA: Insufficient documentation

## 2019-01-05 DIAGNOSIS — Y939 Activity, unspecified: Secondary | ICD-10-CM | POA: Insufficient documentation

## 2019-01-05 DIAGNOSIS — Y929 Unspecified place or not applicable: Secondary | ICD-10-CM | POA: Diagnosis not present

## 2019-01-05 DIAGNOSIS — F17200 Nicotine dependence, unspecified, uncomplicated: Secondary | ICD-10-CM | POA: Diagnosis not present

## 2019-01-05 DIAGNOSIS — M5416 Radiculopathy, lumbar region: Secondary | ICD-10-CM | POA: Diagnosis not present

## 2019-01-05 DIAGNOSIS — Y33XXXA Other specified events, undetermined intent, initial encounter: Secondary | ICD-10-CM | POA: Insufficient documentation

## 2019-01-05 DIAGNOSIS — M5432 Sciatica, left side: Secondary | ICD-10-CM | POA: Diagnosis not present

## 2019-01-05 DIAGNOSIS — S3992XA Unspecified injury of lower back, initial encounter: Secondary | ICD-10-CM | POA: Diagnosis present

## 2019-01-05 DIAGNOSIS — J45909 Unspecified asthma, uncomplicated: Secondary | ICD-10-CM | POA: Insufficient documentation

## 2019-01-05 MED ORDER — MORPHINE SULFATE (PF) 4 MG/ML IV SOLN
4.0000 mg | Freq: Once | INTRAVENOUS | Status: AC
  Administered 2019-01-05: 20:00:00 4 mg via INTRAVENOUS
  Filled 2019-01-05: qty 1

## 2019-01-05 MED ORDER — LIDOCAINE 5 % EX PTCH: 1.0000 | MEDICATED_PATCH | CUTANEOUS | 0 refills | Status: DC

## 2019-01-05 MED ORDER — GADOBUTROL 1 MMOL/ML IV SOLN
9.0000 mL | Freq: Once | INTRAVENOUS | Status: AC | PRN
  Administered 2019-01-05: 9 mL via INTRAVENOUS

## 2019-01-05 MED ORDER — ONDANSETRON HCL 4 MG/2ML IJ SOLN
4.0000 mg | Freq: Once | INTRAMUSCULAR | Status: AC
  Administered 2019-01-05: 4 mg via INTRAVENOUS
  Filled 2019-01-05: qty 2

## 2019-01-05 MED ORDER — CYCLOBENZAPRINE HCL 10 MG PO TABS: 10.0000 mg | ORAL_TABLET | Freq: Two times a day (BID) | ORAL | 0 refills | Status: AC | PRN

## 2019-01-05 MED ORDER — PREDNISONE 10 MG PO TABS: 20.0000 mg | ORAL_TABLET | Freq: Every day | ORAL | 0 refills | Status: DC

## 2019-01-05 MED ORDER — CYCLOBENZAPRINE HCL 10 MG PO TABS
10.0000 mg | ORAL_TABLET | Freq: Once | ORAL | Status: AC
  Administered 2019-01-05: 10 mg via ORAL
  Filled 2019-01-05: qty 1

## 2019-01-05 MED ORDER — DEXAMETHASONE SODIUM PHOSPHATE 10 MG/ML IJ SOLN
10.0000 mg | Freq: Once | INTRAMUSCULAR | Status: AC
  Administered 2019-01-05: 10 mg via INTRAMUSCULAR
  Filled 2019-01-05: qty 1

## 2019-01-05 MED ORDER — KETOROLAC TROMETHAMINE 60 MG/2ML IM SOLN
60.0000 mg | Freq: Once | INTRAMUSCULAR | Status: AC
  Administered 2019-01-05: 16:00:00 60 mg via INTRAMUSCULAR
  Filled 2019-01-05: qty 2

## 2019-01-05 MED ORDER — CYCLOBENZAPRINE HCL 10 MG PO TABS: 10.0000 mg | ORAL_TABLET | Freq: Two times a day (BID) | ORAL | 0 refills | Status: DC | PRN

## 2019-01-05 NOTE — ED Notes (Signed)
Patient transported to MRI 

## 2019-01-05 NOTE — ED Notes (Signed)
Patient transported to X-ray 

## 2019-01-05 NOTE — ED Triage Notes (Signed)
Crying c/o back pain states she was here 2 weeks ago for the same. States she was given Morphine and muscle relaxant and it felt better, she has run out of  Her medication. States she was seeing Dr. Hetty Blend however she lost her job and isn't able to go.

## 2019-01-05 NOTE — ED Notes (Signed)
Placed pt on bedpan to urinate and took her off.

## 2019-01-05 NOTE — ED Notes (Signed)
ED Provider at bedside to discuss dipo

## 2019-01-05 NOTE — ED Provider Notes (Signed)
[facility] Provider Note   CSN: [id number] Arrival date & time: 01/05/19  1356     History   Chief Complaint Chief Complaint  Patient presents with   Back Pain    HPI [name] is a 38 y.o. female.     The history is provided by the patient and medical records.  Back Pain Location:  Lumbar spine Quality:  Shooting, stabbing and burning Radiates to:  L posterior upper leg Pain severity:  Severe Pain is:  Same all the time Onset quality:  Sudden Duration:  10 hours Timing:  Constant Progression:  Unchanged Chronicity:  Recurrent Context: not falling, not jumping from heights, not lifting heavy objects, not recent illness, not recent injury and not twisting   Context comment:  Prior L3-L4 hemilaminectomy Ineffective treatments: 500mg  tylenol. Associated symptoms: numbness (down posterior left buttocks and left upper leg) and tingling   Associated symptoms: no abdominal pain, no bladder incontinence, no bowel incontinence, no chest pain, no dysuria and no fever      Past Medical History:  Diagnosis Date   Anemia    Asthma    Erosive esophagitis    distal esophagus, with low grade bleed 03/2009   GERD (gastroesophageal reflux disease)    Headache(784.0)    History of migraine headaches    IBS (irritable bowel syndrome)    Infection    UTI    Patient Active Problem List   Diagnosis Date Noted   HNP (herniated nucleus pulposus), lumbar 01/05/2016   Depo contraception 02/17/2015   Chronic back pain greater than 3 months duration 12/14/2014   Normal labor 06/12/2014   Flu vaccine need 11/03/2013   Hyperemesis complicating pregnancy, antepartum 11/03/2013   Healthcare maintenance 01/13/2013   Seasonal allergies 01/13/2013   Mesenteric adenitis 02/05/2012   Pes planus (flat feet) 06/11/2011   Reflux esophagitis 03/28/2009   Asthma, mild intermittent, well-controlled 03/11/2009    Past  Surgical History:  Procedure Laterality Date   LUMBAR LAMINECTOMY/DECOMPRESSION MICRODISCECTOMY Right 01/05/2016   Procedure: Right Lumbar three-four Microdiskectomy;  Surgeon: Coletta MemosKyle Cabbell, MD;  Location: [facility];  Service: Neurosurgery;  Laterality: Right;  right   NO PAST SURGERIES       OB History    Gravida  3   Para  3   Term  3   Preterm      AB      Living  3     SAB      TAB      Ectopic      Multiple  0   Live Births  3            Home Medications    Prior to Admission medications   Medication Sig Start Date End Date Taking? Authorizing Provider  acetaminophen (TYLENOL) 500 MG tablet Take 500 mg by mouth every 6 (six) hours as needed for mild pain.    [provider]  acetaminophen-codeine (TYLENOL #3) 300-30 MG tablet Take 1-2 tablets by mouth every 6 (six) hours as needed for moderate pain. Patient not taking: Reported on 12/23/2018 01/17/18   Arby BarrettePfeiffer, Marcy, MD  cyclobenzaprine (FLEXERIL) 10 MG tablet Take 1 tablet (10 mg total) by mouth 3 (three) times daily as needed for muscle spasms. 12/23/18   Pricilla LovelessGoldston, Scott, MD  HYDROmorphone (DILAUDID) 4 MG tablet Take 1 tablet (4 mg total) by mouth every 4 (four) hours as needed for severe pain. 12/23/18   Pricilla LovelessGoldston, Scott, MD  lidocaine (LIDODERM) 5 % Place 1 patch onto the skin daily. Remove & Discard patch within 12 hours or as directed by MD 12/23/18   Pricilla Loveless, MD  medroxyPROGESTERone Acetate 150 MG/ML SUSY Inject 150 mLs into the skin every 3 (three) months. 08/12/17   [provider]  omeprazole (PRILOSEC) 20 MG capsule Take 1 capsule (20 mg total) by mouth daily. Patient not taking: Reported on 12/23/2018 07/18/18   Rodriguez-Southworth, Nettie Elm, PA-C  orphenadrine (NORFLEX) 100 MG tablet Take 1 tablet (100 mg total) by mouth 2 (two) times daily. Patient not taking: Reported on 12/23/2018 01/17/18   Arby Barrette, MD  predniSONE (DELTASONE) 10 MG tablet Take 2 tablets (20 mg total)  by mouth daily. Patient not taking: Reported on 12/23/2018 07/18/18   Rodriguez-Southworth, Nettie Elm, PA-C    Family History Family History  Problem Relation Age of Onset   Migraines Mother    Asthma Mother    Migraines Father    Heart disease Father    Asthma Daughter    Cancer Maternal Grandmother     Social History Social History   Tobacco Use   Smoking status: Light Tobacco Smoker   Smokeless tobacco: Never Used  Substance Use Topics   Alcohol use: No    Comment: occasional on holidays   Drug use: No     Allergies   Ibuprofen   Review of Systems Review of Systems  Constitutional: Positive for activity change (unable to walk since this morning due to pain) and chills. Negative for fever.  HENT: Negative for ear pain and sore throat.   Eyes: Negative for pain and visual disturbance.  Respiratory: Negative for cough and shortness of breath.   Cardiovascular: Negative for chest pain and palpitations.  Gastrointestinal: Negative for abdominal pain, bowel incontinence and vomiting.       Now fecal incontinence  Genitourinary: Negative for bladder incontinence, dyspareunia, dysuria and hematuria.       No urinary incontinence  Musculoskeletal: Positive for back pain (lumbar region). Negative for arthralgias.  Skin: Negative for color change and rash.  Neurological: Positive for tingling and numbness (down posterior left buttocks and left upper leg). Negative for seizures and syncope.  All other systems reviewed and are negative.    Physical Exam Updated Vital Signs BP 117/64    Pulse (!) 56    Temp 99.5 F (37.5 C) (Oral)    Resp (!) 22    Ht  (1.676 m)    Wt 93 kg    SpO2 100%    BMI 33.09 kg/m   Physical Exam Vitals signs and nursing note reviewed.  Constitutional:      General: She is in acute distress (appears to be in significant pain).     Appearance: She is well-developed. She is obese.     Comments: On entering room she was found lying in a  right lateral recumbent position, crying out in pain.  HENT:     Head: Normocephalic and atraumatic.  Eyes:     Conjunctiva/sclera: Conjunctivae normal.  Neck:     Musculoskeletal: Neck supple.  Cardiovascular:     Rate and Rhythm: Normal rate and regular rhythm.     Pulses: Normal pulses.     Heart sounds: No murmur.  Pulmonary:     Effort: Pulmonary effort is normal. No respiratory distress.     Breath sounds: Normal breath sounds.  Abdominal:     General: There is no distension.     Palpations: Abdomen is  soft. There is no mass.     Tenderness: There is no abdominal tenderness.     Hernia: No hernia is present.  Musculoskeletal: Normal range of motion.        General: Tenderness present.     Lumbar back: She exhibits tenderness and pain.     Comments: She does have midline spinal TTP at the L3-L4 spinous processes. There is bilateral lumbar paraspinal tenderness that begins in area of L3 extending to left paraspinal region following the expected course of the sciatic nerve down the posterior left buttocks. Tenderness also elicited along anteromedial aspect of the left upper leg.   Left hip extension reproduces sharp "electric shock-like" pain down the posterior left buttocks and posterior left upper leg to the knee  Sensory examination: L2 (mid anterior thigh): Intact & symmetric bilat  L3 (medial femoral condyle): Intact & symmetric bilat  L4 (medial lower leg & medial malleolus): Intact & symmetric bilat  L5 (lateral lower leg & dorsum of foot): slightly decreased on the left compared to right  S1 (lateral heel): Intact & symmetric bilat  S2 (medial popliteal fossa): mild numbness on the left, right intact  Motor Examination: L2,3 (hip flexors) pt would not complete due to pain L3,4 (knee extensors) pt would not complete due to pain L4,5 (ankle dorsiflexion) Left: 4/5. Right: 5/5 L5 (EHL) Left: 4/5. Right: 5/5 S1 (ankle plantarflexion): Left: 4/5. Right: 5/5 S2 (FHL) Left  4/5 Right 5/5  Skin:    General: Skin is warm and dry.  Neurological:     Mental Status: She is alert.      ED Treatments / Results  Labs (all labs ordered are listed, but only abnormal results are displayed) Labs Reviewed - No data to display  EKG None  Radiology Dg Lumbar Spine 2-3 Views  Result Date: 01/05/2019 CLINICAL DATA:  LEFT side low back radiating to LEFT leg for 1 day, no injury, previous RIGHT-sided L3-L4 micro diskectomy in 2017 EXAM: LUMBAR SPINE - 2-3 VIEW COMPARISON:  MRI lumbar spine 01/16/2018 FINDINGS: 5 non-rib-bearing lumbar vertebra. Disc space narrowing L3-L4 with tiny anterior endplate spurs. Minimal disc space narrowing L4-L5. Vertebral body heights maintained without fracture or subluxation. No bone destruction or spondylolysis. IMPRESSION: Mild degenerative disc disease changes of the lumbar spine as above. No acute abnormalities. Electronically Signed   By: Ulyses SouthwardMark  Boles M.D.   On: 01/05/2019 16:45   Mr Lumbar Spine W Wo Contrast  Result Date: 01/05/2019 CLINICAL DATA:  Acute pain recurrence, intractable. History of L3-4 hemilaminectomy in 2017 EXAM: MRI LUMBAR SPINE WITHOUT AND WITH CONTRAST TECHNIQUE: Multiplanar and multiecho pulse sequences of the lumbar spine were obtained without and with intravenous contrast. CONTRAST:  9 cc Gadavist intravenous COMPARISON:  01/16/2018 FINDINGS: Segmentation:  5 lumbar type vertebrae Alignment:  Physiologic Vertebrae:  No fracture, evidence of discitis, or bone lesion. Conus medullaris and cauda equina: Conus extends to the L1-2 level. Conus and cauda equina appear normal. Paraspinal and other soft tissues: Negative Disc levels: T12- L1: Unremarkable. L1-L2: Unremarkable. L2-L3: Minor disc desiccation and bulging.  No impingement L3-L4: Mild disc narrowing and bulging with small downward pointing protrusion. No impingement L4-L5: Disc narrowing and desiccation with shallow central protrusion. Patent canal and foramina  L5-S1:Unremarkable. IMPRESSION: 1. No acute finding or neural impingement. 2. L3-4 and L4-5 disc degeneration without evident progression from 2019. Electronically Signed   By: Marnee SpringJonathon  Watts M.D.   On: 01/05/2019 20:40     Procedures Procedures (including critical care time)  Medications Ordered in ED Medications  ketorolac (TORADOL) injection 60 mg (60 mg Intramuscular Given 01/05/19 1603)  cyclobenzaprine (FLEXERIL) tablet 10 mg (10 mg Oral Given 01/05/19 1603)  dexamethasone (DECADRON) injection 10 mg (10 mg Intramuscular Given 01/05/19 1703)  morphine 4 MG/ML injection 4 mg (4 mg Intravenous Given 01/05/19 1931)  ondansetron (ZOFRAN) injection 4 mg (4 mg Intravenous Given 01/05/19 1931)  gadobutrol (GADAVIST) 1 MMOL/ML injection 9 mL (9 mLs Intravenous Contrast Given 01/05/19 2032)     Initial Impression / Assessment and Plan / ED Course  I have reviewed the triage vital signs and the nursing notes.  Pertinent labs & imaging results that were available during my care of the patient were reviewed by me and considered in my medical decision making (see chart for details).  Differentials considered: Acute on chronic lumbar radiculopathy with sciatica, acute disc herniation, spinal epidural abscess, spinal epidural hematoma, osteomyelitis, cauda equina syndrome   Medical Decision Making:  [name] GasserFealethea D Mac is a 38 y.o. female with PMHx significant for lumbar disc herniation s/p right L3-L4 microdiscectomy (12/2015 with Dr. Franky Machoabbell) who presents to the ED today for acute onset, severe midline lower back pain radiating down the left buttocks with numbness down the posterior and anterior-medial left upper leg.  The pain started this morning and woke her up from her sleep around 0530 hrs. there was no known proceeding traumatic events.  She was seen for acute lower back pain radiating to the right leg several weeks ago (on 6/23) [facility] and was treated with IM Dilaudid and given prescriptions  for Lidoderm patches, Flexeril, and oral Dilaudid for acute right sciatica.  She improved and her symptoms resolved following that visit until this morning.  She is crying out in pain on exam and expressed she has been unable to get in to her spinal surgeon because she has lost her job and insurance in the last year and says she was told she would have to pay around $750 up front to be seen again at that office.   She arrives afebrile, though with temp of 99.5, and hemodynamically stable.  Her physical examination was limited by tenderness and was done with her primarily in the right lateral recumbent position.  On initial exam she would not stand or attempt to ambulate due to the severity of her pain.  She was given an oral Flexeril, intramuscular Toradol, intramuscular Decadron and reassessed.  Her pain was slightly improved but she still would not ambulate and, again, had midline tenderness.  There was no obvious acute fracture or traumatic malalignment on plain film imaging of the lumbar spine, however, normal plain film lumbar imaging has very low sensitivity for acute cord injury, epidural hematoma, and epidural abscess. Given the lack of improvement, her acute exam findings and her objective painful distress, we did elect to obtain MRI.   Findings of the MRI are described above, and showed no evidence of acute spinal epidural pathology or acute emergent cord compression.  She was given a single dose of IV morphine at the time of obtaining the MRI.  Her reassessment following the MR imaging did show clinical improvement and she was able to stand and ambulate, slowly, but with pain in the department.  She was offered reassurance and given prescriptions for Flexeril and a short course of oral steroid and was encouraged to follow-up with her PCP and her spinal surgeon as soon as she is able.  I explained the symptoms that would warrant  return to the emergency department.  She expressed understanding and is  in agreement with the plan at this time.  Disposition: Discharge   Key discharge instructions: Strict return precautions provided. Patient was encouraged to return to the ED should they experience worsening or persistence of current symptoms, or should they develop new concerning symptoms. Encouraged them to f/u with their PCP on an outpatient basis. Questions regarding the diagnosis were answered, and side effects regarding therapies were provided in writing or orally. Patient discharged in stable condition.   The care of this patient was supervised by Dr. Quintella Reichert, who agreed with the plan and management of the patient.   Final Clinical Impressions(s) / ED Diagnoses   Final diagnoses:  Sciatica of left side  Acute lumbar radiculopathy  Strain of lumbar region, initial encounter    ED Discharge Orders         Ordered    cyclobenzaprine (FLEXERIL) 10 MG tablet  2 times daily PRN,   Status:  Discontinued     01/05/19 2155    lidocaine (LIDODERM) 5 %  Every 24 hours,   Status:  Discontinued     01/05/19 2155    predniSONE (DELTASONE) 10 MG tablet  Daily     01/05/19 2155    cyclobenzaprine (FLEXERIL) 10 MG tablet  2 times daily PRN     01/05/19 2226           Jefm Petty, MD 01/06/19 1622    Quintella Reichert, MD 01/07/19 1242

## 2019-03-16 ENCOUNTER — Other Ambulatory Visit: Payer: Self-pay

## 2019-03-16 ENCOUNTER — Ambulatory Visit (HOSPITAL_COMMUNITY)
Admission: EM | Admit: 2019-03-16 | Discharge: 2019-03-16 | Disposition: A | Discharge status: Home / Self Care | Payer: Medicaid Other | Attending: Family Medicine | Admitting: Family Medicine

## 2019-03-16 ENCOUNTER — Encounter (HOSPITAL_COMMUNITY): Payer: Self-pay

## 2019-03-16 DIAGNOSIS — L03317 Cellulitis of buttock: Secondary | ICD-10-CM | POA: Diagnosis not present

## 2019-03-16 MED ORDER — TRAMADOL HCL 50 MG PO TABS: 100.0000 mg | ORAL_TABLET | Freq: Four times a day (QID) | ORAL | 0 refills | Status: DC | PRN

## 2019-03-16 MED ORDER — SULFAMETHOXAZOLE-TRIMETHOPRIM 800-160 MG PO TABS: 1.0000 | ORAL_TABLET | Freq: Two times a day (BID) | ORAL | 0 refills | Status: AC

## 2019-03-16 NOTE — ED Provider Notes (Signed)
[facility]    CSN: [id number] Arrival date & time: 03/16/19  1925      History   Chief Complaint Chief Complaint  Patient presents with  . Insect Bite    HPI [name] is a 38 y.o. female.   HPI  Patient has a growing area of painful red rash on her right thigh.  It is getting larger.  Very firm.  Is very painful.  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  They told her it was an infection.  She is here for evaluation.  She states that the pain is terrible Initially I offered her pain medication but then after looking at her record, she does receive Dilaudid and oxycodone from her spine surgeon on a regular basis.  I think additional narcotics are ill advised  Past Medical History:  Diagnosis Date  . Anemia   . Asthma   . Erosive esophagitis    distal esophagus, with low grade bleed 03/2009  . GERD (gastroesophageal reflux disease)   . Headache(784.0)   . History of migraine headaches   . IBS (irritable bowel syndrome)   . Infection    UTI    Patient Active Problem List   Diagnosis Date Noted  . HNP (herniated nucleus pulposus), lumbar 01/05/2016  . Depo contraception 02/17/2015  . Chronic back pain greater than 3 months duration 12/14/2014  . Normal labor 06/12/2014  . Flu vaccine need 11/03/2013  . Hyperemesis complicating pregnancy, antepartum 11/03/2013  . Healthcare maintenance 01/13/2013  . Seasonal allergies 01/13/2013  . Mesenteric adenitis 02/05/2012  . Pes planus (flat feet) 06/11/2011  . Reflux esophagitis 03/28/2009  . Asthma, mild intermittent, well-controlled 03/11/2009    Past Surgical History:  Procedure Laterality Date  . LUMBAR LAMINECTOMY/DECOMPRESSION MICRODISCECTOMY Right 01/05/2016   Procedure: Right Lumbar three-four Microdiskectomy;  Surgeon: Coletta Memos, MD;  Location: [facility];  Service: Neurosurgery;  Laterality: Right;  right  . NO PAST SURGERIES      OB History    Gravida  3   Para  3   Term  3   Preterm      AB      Living  3     SAB      TAB      Ectopic      Multiple  0   Live Births  3            Home Medications    Prior to Admission medications   Medication Sig Start Date End Date Taking? Authorizing Provider  cyclobenzaprine (FLEXERIL) 10 MG tablet Take 10 mg by mouth 3 (three) times daily as needed for muscle spasms.   Yes [provider]  acetaminophen (TYLENOL) 500 MG tablet Take 500 mg by mouth every 6 (six) hours as needed for mild pain.    [provider]  medroxyPROGESTERone Acetate 150 MG/ML SUSY Inject 150 mLs into the skin every 3 (three) months. 08/12/17   [provider]  sulfamethoxazole-trimethoprim (BACTRIM DS) 800-160 MG tablet Take 1 tablet by mouth 2 (two) times daily for 7 days. 03/16/19 03/23/19  Eustace Sharrow, MD  traMADol (ULTRAM) 50 MG tablet Take 2 tablets (100 mg total) by mouth every 6 (six) hours as needed. 03/16/19   Eustace Jehle, MD  omeprazole (PRILOSEC) 20 MG capsule Take 1 capsule (20 mg total) by mouth daily. Patient not taking: Reported on 12/23/2018 07/18/18 03/16/19  Rodriguez-Southworth, Nettie Elm, PA-C    Family History  Family History  Problem Relation Age of Onset  . Migraines Mother   . Asthma Mother   . Migraines Father   . Heart disease Father   . Asthma Daughter   . Cancer Maternal Grandmother     Social History Social History   Tobacco Use  . Smoking status: Light Tobacco Smoker  . Smokeless tobacco: Never Used  Substance Use Topics  . Alcohol use: No    Comment: occasional on holidays  . Drug use: No     Allergies   Ibuprofen   Review of Systems Review of Systems  Constitutional: Negative for chills and fever.  HENT: Negative for ear pain and sore throat.   Eyes: Negative for pain and visual disturbance.  Respiratory: Negative for cough and shortness of breath.   Cardiovascular: Negative for chest pain and palpitations.  Gastrointestinal: Negative for  abdominal pain and vomiting.  Genitourinary: Negative for dysuria and hematuria.  Musculoskeletal: Negative for arthralgias and back pain.  Skin: Positive for wound. Negative for color change and rash.  Neurological: Negative for seizures and syncope.  All other systems reviewed and are negative.    Physical Exam Triage Vital Signs ED Triage Vitals  Enc Vitals Group     BP 03/16/19 1954 125/89     Pulse Rate 03/16/19 1954 (!) 101     Resp 03/16/19 1954 16     Temp 03/16/19 1954 98.5 F (36.9 C)     Temp Source 03/16/19 1954 Temporal     SpO2 03/16/19 1954 100 %     Weight --      Height --      Head Circumference --      Peak Flow --      Pain Score 03/16/19 1951 9     Pain Loc --      Pain Edu? --      Excl. in GC? --    No data found.  Updated Vital Signs BP 125/89 (BP Location: Right Arm)   Pulse (!) 101   Temp 98.5 F (36.9 C) (Temporal)   Resp 16   SpO2 100%       Physical Exam Constitutional:      General: She is not in acute distress.    Appearance: She is well-developed. She is obese.  HENT:     Head: Normocephalic and atraumatic.  Eyes:     Conjunctiva/sclera: Conjunctivae normal.     Pupils: Pupils are equal, round, and reactive to light.  Neck:     Musculoskeletal: Normal range of motion.  Cardiovascular:     Rate and Rhythm: Normal rate.  Pulmonary:     Effort: Pulmonary effort is normal. No respiratory distress.  Abdominal:     General: There is no distension.     Palpations: Abdomen is soft.  Musculoskeletal: Normal range of motion.  Skin:    General: Skin is warm and dry.     Comments: The upper thigh lateral buttock area on the right has a 7 cm indurated erythematous lesion that is very painful.  No fluctuance  Neurological:     Mental Status: She is alert.      UC Treatments / Results  Labs (all labs ordered are listed, but only abnormal results are displayed) Labs Reviewed - No data to display  EKG   Radiology No results  found.  Procedures Procedures (including critical care time)  Medications Ordered in UC Medications - No data to display  Initial Impression / Assessment and  Plan / UC Course  I have reviewed the triage vital signs and the nursing notes.  Pertinent labs & imaging results that were available during my care of the patient were reviewed by me and considered in my medical decision making (see chart for details).      Final Clinical Impressions(s) / UC Diagnoses   Final diagnoses:  Cellulitis of buttock     Discharge Instructions     Take the antibiotic 2 times a day.  Warm compresses to area You can try tramadol 2 pills up to 3 times a day for the pain.  Hopefully this does not bother your migraine    ED Prescriptions    Medication Sig Dispense Auth. Provider   sulfamethoxazole-trimethoprim (BACTRIM DS) 800-160 MG tablet Take 1 tablet by mouth 2 (two) times daily for 7 days. 14 tablet Raylene Everts, MD   traMADol (ULTRAM) 50 MG tablet Take 2 tablets (100 mg total) by mouth every 6 (six) hours as needed. 15 tablet Raylene Everts, MD     Controlled Substance Prescriptions [place] Controlled Substance Registry consulted? y   Raylene Everts, MD 03/16/19 2038

## 2019-03-16 NOTE — Discharge Instructions (Signed)
Take the antibiotic 2 times a day.  Warm compresses to area You can try tramadol 2 pills up to 3 times a day for the pain.  Hopefully this does not bother your migraine

## 2019-03-16 NOTE — ED Triage Notes (Signed)
Patient presents to Urgent Care with complaints of insect bite to upper right thigh since yesterday. Patient reports there is approx 2.5inches in diameter of swelling.

## 2019-03-20 ENCOUNTER — Ambulatory Visit (HOSPITAL_COMMUNITY)
Admission: EM | Admit: 2019-03-20 | Discharge: 2019-03-20 | Disposition: A | Discharge status: Home / Self Care | Payer: Medicaid Other | Attending: Family Medicine | Admitting: Family Medicine

## 2019-03-20 ENCOUNTER — Encounter (HOSPITAL_COMMUNITY): Payer: Self-pay | Admitting: Emergency Medicine

## 2019-03-20 ENCOUNTER — Other Ambulatory Visit: Payer: Self-pay

## 2019-03-20 DIAGNOSIS — L0231 Cutaneous abscess of buttock: Secondary | ICD-10-CM | POA: Diagnosis not present

## 2019-03-20 MED ORDER — CEFTRIAXONE SODIUM 1 G IJ SOLR
1.0000 g | Freq: Once | INTRAMUSCULAR | Status: AC
  Administered 2019-03-20: 20:00:00 1 g via INTRAMUSCULAR

## 2019-03-20 MED ORDER — CEFTRIAXONE SODIUM 1 G IJ SOLR
INTRAMUSCULAR | Status: AC
  Filled 2019-03-20: qty 10

## 2019-03-20 MED ORDER — LIDOCAINE HCL (PF) 1 % IJ SOLN
INTRAMUSCULAR | Status: AC
  Filled 2019-03-20: qty 2

## 2019-03-20 MED ORDER — KETOROLAC TROMETHAMINE 60 MG/2ML IM SOLN
60.0000 mg | Freq: Once | INTRAMUSCULAR | Status: AC
  Administered 2019-03-20: 60 mg via INTRAMUSCULAR

## 2019-03-20 MED ORDER — KETOROLAC TROMETHAMINE 60 MG/2ML IM SOLN
INTRAMUSCULAR | Status: AC
  Filled 2019-03-20: qty 2

## 2019-03-20 NOTE — ED Triage Notes (Signed)
Pt here for f/u of the cellulitis to her right buttocks.  Pt states it is getting worse, it has turned green and it is oozing pus.  Pt did not pick up Tramadol from pharmacy as she states she gets hives when she takes it.

## 2019-03-20 NOTE — Discharge Instructions (Signed)
Continue warm compresses. The Toradol will help with the pain. We gave you Rocephin, to have some stronger antibiotic Now that this is draining it should resolve

## 2019-03-20 NOTE — ED Provider Notes (Signed)
[facility]    CSN: [id number] Arrival date & time: 03/20/19  1942      History   Chief Complaint Chief Complaint  Patient presents with  . Follow-up  . Cellulitis    HPI [name] is a 38 y.o. female.   HPI   Patient is back here for her cellulitis.  It has enlarged and now is draining purulence.  She states it still very painful.  It formed an abscess and drained spontaneously.  She states she is taking her antibiotics intermittently.  I advised her it is important to take twice a day. Past Medical History:  Diagnosis Date  . Anemia   . Asthma   . Erosive esophagitis    distal esophagus, with low grade bleed 03/2009  . GERD (gastroesophageal reflux disease)   . Headache(784.0)   . History of migraine headaches   . IBS (irritable bowel syndrome)   . Infection    UTI    Patient Active Problem List   Diagnosis Date Noted  . HNP (herniated nucleus pulposus), lumbar 01/05/2016  . Depo contraception 02/17/2015  . Chronic back pain greater than 3 months duration 12/14/2014  . Normal labor 06/12/2014  . Flu vaccine need 11/03/2013  . Hyperemesis complicating pregnancy, antepartum 11/03/2013  . Healthcare maintenance 01/13/2013  . Seasonal allergies 01/13/2013  . Mesenteric adenitis 02/05/2012  . Pes planus (flat feet) 06/11/2011  . Reflux esophagitis 03/28/2009  . Asthma, mild intermittent, well-controlled 03/11/2009    Past Surgical History:  Procedure Laterality Date  . LUMBAR LAMINECTOMY/DECOMPRESSION MICRODISCECTOMY Right 01/05/2016   Procedure: Right Lumbar three-four Microdiskectomy;  Surgeon: Coletta Memos, MD;  Location: [facility];  Service: Neurosurgery;  Laterality: Right;  right  . NO PAST SURGERIES      OB History    Gravida  3   Para  3   Term  3   Preterm      AB      Living  3     SAB      TAB      Ectopic      Multiple  0   Live Births  3            Home Medications    Prior to Admission  medications   Medication Sig Start Date End Date Taking? Authorizing Provider  sulfamethoxazole-trimethoprim (BACTRIM DS) 800-160 MG tablet Take 1 tablet by mouth 2 (two) times daily for 7 days. 03/16/19 03/23/19 Yes Eustace Wheller, MD  acetaminophen (TYLENOL) 500 MG tablet Take 500 mg by mouth every 6 (six) hours as needed for mild pain.    [provider]  cyclobenzaprine (FLEXERIL) 10 MG tablet Take 10 mg by mouth 3 (three) times daily as needed for muscle spasms.    [provider]  medroxyPROGESTERone Acetate 150 MG/ML SUSY Inject 150 mLs into the skin every 3 (three) months. 08/12/17   [provider]  traMADol (ULTRAM) 50 MG tablet Take 2 tablets (100 mg total) by mouth every 6 (six) hours as needed. 03/16/19   Eustace Miranda, MD  omeprazole (PRILOSEC) 20 MG capsule Take 1 capsule (20 mg total) by mouth daily. Patient not taking: Reported on 12/23/2018 07/18/18 03/16/19  Rodriguez-Southworth, Nettie Elm, PA-C    Family History Family History  Problem Relation Age of Onset  . Migraines Mother   . Asthma Mother   . Migraines Father   . Heart disease Father   . Asthma Daughter   . Cancer  Maternal Grandmother     Social History Social History   Tobacco Use  . Smoking status: Light Tobacco Smoker  . Smokeless tobacco: Never Used  Substance Use Topics  . Alcohol use: No    Comment: occasional on holidays  . Drug use: No     Allergies   Ibuprofen and Tramadol   Review of Systems Review of Systems  Constitutional: Negative for chills and fever.  HENT: Negative for ear pain and sore throat.   Eyes: Negative for pain and visual disturbance.  Respiratory: Negative for cough and shortness of breath.   Cardiovascular: Negative for chest pain and palpitations.  Gastrointestinal: Negative for abdominal pain and vomiting.  Genitourinary: Negative for dysuria and hematuria.  Musculoskeletal: Negative for arthralgias and back pain.  Skin: Positive for  wound. Negative for color change and rash.  Neurological: Negative for seizures and syncope.  All other systems reviewed and are negative.    Physical Exam Triage Vital Signs ED Triage Vitals  Enc Vitals Group     BP 03/20/19 2004 138/84     Pulse Rate 03/20/19 2004 (!) 104     Resp 03/20/19 2004 20     Temp 03/20/19 2004 98.5 F (36.9 C)     Temp Source 03/20/19 2004 Oral     SpO2 03/20/19 2004 100 %     Weight --      Height --      Head Circumference --      Peak Flow --      Pain Score 03/20/19 2002 7     Pain Loc --      Pain Edu? --      Excl. in [place]? --    No data found.  Updated Vital Signs BP 138/84 (BP Location: Right Arm)   Pulse (!) 104   Temp 98.5 F (36.9 C) (Oral)   Resp 20   SpO2 100%   Visual Acuity Right Eye Distance:   Left Eye Distance:   Bilateral Distance:    Right Eye Near:   Left Eye Near:    Bilateral Near:     Physical Exam Constitutional:      General: She is not in acute distress.    Appearance: She is well-developed.  HENT:     Head: Normocephalic and atraumatic.  Eyes:     Conjunctiva/sclera: Conjunctivae normal.     Pupils: Pupils are equal, round, and reactive to light.  Neck:     Musculoskeletal: Normal range of motion.  Cardiovascular:     Rate and Rhythm: Normal rate.  Pulmonary:     Effort: Pulmonary effort is normal. No respiratory distress.  Abdominal:     General: There is no distension.     Palpations: Abdomen is soft.  Musculoskeletal: Normal range of motion.  Skin:    General: Skin is warm and dry.     Comments: Right buttock has a large 6 x 8 cm area of cellulitis with a punctate 1 cm opening, draining purulence  Neurological:     Mental Status: She is alert.      UC Treatments / Results  Labs (all labs ordered are listed, but only abnormal results are displayed) Labs Reviewed - No data to display  EKG   Radiology No results found.  Procedures Procedures (including critical care time)   Medications Ordered in UC Medications  ketorolac (TORADOL) injection 60 mg (60 mg Intramuscular Given 03/20/19 2017)  cefTRIAXone (ROCEPHIN) injection 1 g (1 g Intramuscular Given  03/20/19 2017)  ketorolac (TORADOL) 60 MG/2ML injection (has no administration in time range)  cefTRIAXone (ROCEPHIN) 1 g injection (has no administration in time range)  lidocaine (PF) (XYLOCAINE) 1 % injection (has no administration in time range)    Initial Impression / Assessment and Plan / UC Course  I have reviewed the triage vital signs and the nursing notes.  Pertinent labs & imaging results that were available during my care of the patient were reviewed by me and considered in my medical decision making (see chart for details).     Abscess of buttocks Final Clinical Impressions(s) / UC Diagnoses   Final diagnoses:  Abscess of right buttock     Discharge Instructions     Continue warm compresses. The Toradol will help with the pain. We gave you Rocephin, to have some stronger antibiotic Now that this is draining it should resolve    ED Prescriptions    None     PDMP not reviewed this encounter.   Eustace MooreNelson, Yvonne Sue, MD 03/20/19 2018

## 2020-03-16 ENCOUNTER — Encounter (HOSPITAL_COMMUNITY): Payer: Self-pay | Admitting: Emergency Medicine

## 2020-03-16 ENCOUNTER — Ambulatory Visit (INDEPENDENT_AMBULATORY_CARE_PROVIDER_SITE_OTHER): Payer: Medicaid Other

## 2020-03-16 ENCOUNTER — Other Ambulatory Visit: Payer: Self-pay

## 2020-03-16 ENCOUNTER — Ambulatory Visit (HOSPITAL_COMMUNITY)
Admission: EM | Admit: 2020-03-16 | Discharge: 2020-03-16 | Disposition: A | Discharge status: Home / Self Care | Payer: Medicaid Other | Attending: Family Medicine | Admitting: Family Medicine

## 2020-03-16 DIAGNOSIS — S66911A Strain of unspecified muscle, fascia and tendon at wrist and hand level, right hand, initial encounter: Secondary | ICD-10-CM

## 2020-03-16 DIAGNOSIS — S6991XA Unspecified injury of right wrist, hand and finger(s), initial encounter: Secondary | ICD-10-CM | POA: Diagnosis not present

## 2020-03-16 MED ORDER — HYDROCODONE-ACETAMINOPHEN 5-325 MG PO TABS
ORAL_TABLET | ORAL | Status: AC
  Filled 2020-03-16: qty 1

## 2020-03-16 MED ORDER — HYDROCODONE-ACETAMINOPHEN 5-325 MG PO TABS
1.0000 | ORAL_TABLET | Freq: Once | ORAL | Status: AC
  Administered 2020-03-16: 1 via ORAL

## 2020-03-16 MED ORDER — ACETAMINOPHEN 500 MG PO TABS: 1000.0000 mg | ORAL_TABLET | Freq: Three times a day (TID) | ORAL | 0 refills | Status: DC | PRN

## 2020-03-16 NOTE — ED Provider Notes (Signed)
[facility]    CSN: [id number] Arrival date & time: 03/16/20  9983      History   Chief Complaint Chief Complaint  Patient presents with  . Hand Injury    HPI [name] is a 39 y.o. female.   [name] presents with complaints of right wrist pain which started suddenly today. She was reaching with a flexed wrist under her axilla to adjust her clothing, when the pain started and has persisted. She types for work. No other injury. No numbness or tingling. Pain with any movement of the wrist. She feels it is swollen as well. Hasn't taken any medications for symptoms. History of esophagitis.    ROS per HPI, negative if not otherwise mentioned.      Past Medical History:  Diagnosis Date  . Anemia   . Asthma   . Erosive esophagitis    distal esophagus, with low grade bleed 03/2009  . GERD (gastroesophageal reflux disease)   . Headache(784.0)   . History of migraine headaches   . IBS (irritable bowel syndrome)   . Infection    UTI    Patient Active Problem List   Diagnosis Date Noted  . HNP (herniated nucleus pulposus), lumbar 01/05/2016  . Depo contraception 02/17/2015  . Chronic back pain greater than 3 months duration 12/14/2014  . Normal labor 06/12/2014  . Flu vaccine need 11/03/2013  . Hyperemesis complicating pregnancy, antepartum 11/03/2013  . Healthcare maintenance 01/13/2013  . Seasonal allergies 01/13/2013  . Mesenteric adenitis 02/05/2012  . Pes planus (flat feet) 06/11/2011  . Reflux esophagitis 03/28/2009  . Asthma, mild intermittent, well-controlled 03/11/2009    Past Surgical History:  Procedure Laterality Date  . LUMBAR LAMINECTOMY/DECOMPRESSION MICRODISCECTOMY Right 01/05/2016   Procedure: Right Lumbar three-four Microdiskectomy;  Surgeon: Coletta Memos, MD;  Location: [facility];  Service: Neurosurgery;  Laterality: Right;  right  . NO PAST SURGERIES      OB History    Gravida  3   Para  3   Term  3    Preterm      AB      Living  3     SAB      TAB      Ectopic      Multiple  0   Live Births  3            Home Medications    Prior to Admission medications   Medication Sig Start Date End Date Taking? Authorizing Provider  albuterol (VENTOLIN HFA) 108 (90 Base) MCG/ACT inhaler Inhale into the lungs every 6 (six) hours as needed for wheezing or shortness of breath.   Yes [provider]  acetaminophen (TYLENOL) 500 MG tablet Take 2 tablets (1,000 mg total) by mouth every 8 (eight) hours as needed for moderate pain. 03/16/20   Georgetta Haber, NP  cyclobenzaprine (FLEXERIL) 10 MG tablet Take 10 mg by mouth 3 (three) times daily as needed for muscle spasms.    [provider]  medroxyPROGESTERone Acetate 150 MG/ML SUSY Inject 150 mLs into the skin every 3 (three) months. 08/12/17   [provider]  traMADol (ULTRAM) 50 MG tablet Take 2 tablets (100 mg total) by mouth every 6 (six) hours as needed. 03/16/19   Eustace Overacker, MD  omeprazole (PRILOSEC) 20 MG capsule Take 1 capsule (20 mg total) by mouth daily. Patient not taking: Reported on 12/23/2018 07/18/18 03/16/19  Rodriguez-Southworth, Nettie Elm, PA-C    Family History  Family History  Problem Relation Age of Onset  . Migraines Mother   . Asthma Mother   . Migraines Father   . Heart disease Father   . Asthma Daughter   . Cancer Maternal Grandmother     Social History Social History   Tobacco Use  . Smoking status: Light Tobacco Smoker  . Smokeless tobacco: Never Used  Substance Use Topics  . Alcohol use: No    Comment: occasional on holidays  . Drug use: No     Allergies   Ibuprofen and Tramadol   Review of Systems Review of Systems   Physical Exam Triage Vital Signs ED Triage Vitals  Enc Vitals Group     BP 03/16/20 1039 (!) 161/90     Pulse Rate 03/16/20 1039 67     Resp 03/16/20 1039 14     Temp 03/16/20 1039 99 F (37.2 C)     Temp Source 03/16/20 1039 Oral      SpO2 03/16/20 1039 100 %     Weight --      Height --      Head Circumference --      Peak Flow --      Pain Score 03/16/20 1035 10     Pain Loc --      Pain Edu? --      Excl. in [place]? --    No data found.  Updated Vital Signs BP (!) 161/90 (BP Location: Left Arm)   Pulse 67   Temp 99 F (37.2 C) (Oral)   Resp 14   SpO2 100%   Visual Acuity Right Eye Distance:   Left Eye Distance:   Bilateral Distance:    Right Eye Near:   Left Eye Near:    Bilateral Near:     Physical Exam Constitutional:      General: She is not in acute distress.    Appearance: She is well-developed.  Cardiovascular:     Rate and Rhythm: Normal rate.  Pulmonary:     Effort: Pulmonary effort is normal.  Musculoskeletal:     Right wrist: Tenderness and bony tenderness present. No swelling, deformity, effusion or lacerations. Decreased range of motion.     Right hand: Normal.     Comments: Generalized right wrist tenderness on palpation, exam limited by pain, patient is tearful; pain with flexion, extension and rotation of the wrist; strong radial pulse and cap refill <2 seconds to fingers   Skin:    General: Skin is warm and dry.  Neurological:     Mental Status: She is alert and oriented to person, place, and time.      UC Treatments / Results  Labs (all labs ordered are listed, but only abnormal results are displayed) Labs Reviewed - No data to display  EKG   Radiology DG Wrist Complete Right  Result Date: 03/16/2020 CLINICAL DATA:  Pain following trauma EXAM: RIGHT WRIST - COMPLETE 3+ VIEW COMPARISON:  None. FINDINGS: Frontal, oblique, lateral, and ulnar deviation scaphoid images were obtained. There is no appreciable fracture or dislocation. Joint spaces appear normal. No erosive change. IMPRESSION: No fracture or dislocation.  No evident arthropathy. Electronically Signed   By: Bretta Bang III M.D.   On: 03/16/2020 11:22    Procedures Procedures (including critical care  time)  Medications Ordered in UC Medications  HYDROcodone-acetaminophen (NORCO/VICODIN) 5-325 MG per tablet 1 tablet (has no administration in time range)    Initial Impression / Assessment and Plan / UC Course  I have reviewed the triage vital signs and the nursing notes.  Pertinent labs & imaging results that were available during my care of the patient were reviewed by me and considered in my medical decision making (see chart for details).     Non traumatic injury of right wrist with normal xray. Spasm and pain today after flexion of the wrist. 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 Brace provided. Pain management discussed. Follow up recommendations provided. Patient verbalized understanding and agreeable to plan.   Final Clinical Impressions(s) / UC Diagnoses   Final diagnoses:  Strain of right wrist, initial encounter     Discharge Instructions     Your xray is normal today which is reassuring.  Based on the mechanism of injury this is likely some strain and spasm.  You may use the brace we have provided as needed for comfort.  Hopefully the hydrocodone helps with pain here today.  Tylenol as needed for breakthrough pain.  Follow up with sports medicine as needed for persistent symptoms.    ED Prescriptions    Medication Sig Dispense Auth. Provider   acetaminophen (TYLENOL) 500 MG tablet Take 2 tablets (1,000 mg total) by mouth every 8 (eight) hours as needed for moderate pain. 30 tablet Georgetta Haber, NP     PDMP not reviewed this encounter.   Georgetta Haber, NP 03/16/20 1148

## 2020-03-16 NOTE — Discharge Instructions (Signed)
Your xray is normal today which is reassuring.  Based on the mechanism of injury this is likely some strain and spasm.  You may use the brace we have provided as needed for comfort.  Hopefully the hydrocodone helps with pain here today.  Tylenol as needed for breakthrough pain.  Follow up with sports medicine as needed for persistent symptoms.

## 2020-03-16 NOTE — ED Triage Notes (Signed)
Pt states this morning when she was getting dressing she injured her right wrist. She thinks she might have dislocated it. Her hand has started swelling.

## 2020-03-17 ENCOUNTER — Ambulatory Visit (INDEPENDENT_AMBULATORY_CARE_PROVIDER_SITE_OTHER): Payer: Commercial Managed Care - PPO | Admitting: Family Medicine

## 2020-03-17 ENCOUNTER — Ambulatory Visit: Payer: Self-pay

## 2020-03-17 ENCOUNTER — Encounter: Payer: Self-pay | Admitting: Family Medicine

## 2020-03-17 VITALS — BP 131/83 | HR 84 | Ht 65.0 in | Wt 210.0 lb

## 2020-03-17 DIAGNOSIS — S56211A Strain of other flexor muscle, fascia and tendon at forearm level, right arm, initial encounter: Secondary | ICD-10-CM

## 2020-03-17 DIAGNOSIS — S63091D Other subluxation of right wrist and hand, subsequent encounter: Secondary | ICD-10-CM | POA: Insufficient documentation

## 2020-03-17 DIAGNOSIS — M25531 Pain in right wrist: Secondary | ICD-10-CM

## 2020-03-17 MED ORDER — PREDNISONE 5 MG PO TABS: ORAL_TABLET | ORAL | 0 refills | Status: DC

## 2020-03-17 NOTE — Patient Instructions (Signed)
Nice to meet you Please try the splint  Please try the duexis  Please try ice   Please send me a message in MyChart with any questions or updates.  Please see me back in 1 week.   --Dr. Jordan Likes

## 2020-03-17 NOTE — Progress Notes (Signed)
[name] - 39 y.o. female MRN [id number]  Date of birth: 05-14-1981  SUBJECTIVE:  Including CC & ROS.  Chief Complaint  Patient presents with  . Wrist Pain    right x 1 day    [name] is a 39 y.o. female that is presenting with acute right wrist pain.  The pain occurred yesterday while she was pulling a tag from the dress.  Since that time she has had excruciating ulnar-sided wrist pain.  Denies any history of similar pain.  Pain exacerbated with any supination or pronation.  Independent review of the right wrist x-ray from 9/15 shows no acute changes.   Review of Systems See HPI   HISTORY: Past Medical, Surgical, Social, and Family History Reviewed & Updated per EMR.   Pertinent Historical Findings include:  Past Medical History:  Diagnosis Date  . Anemia   . Asthma   . Erosive esophagitis    distal esophagus, with low grade bleed 03/2009  . GERD (gastroesophageal reflux disease)   . Headache(784.0)   . History of migraine headaches   . IBS (irritable bowel syndrome)   . Infection    UTI    Past Surgical History:  Procedure Laterality Date  . LUMBAR LAMINECTOMY/DECOMPRESSION MICRODISCECTOMY Right 01/05/2016   Procedure: Right Lumbar three-four Microdiskectomy;  Surgeon: Coletta Memos, MD;  Location: [facility];  Service: Neurosurgery;  Laterality: Right;  right  . NO PAST SURGERIES      Family History  Problem Relation Age of Onset  . Migraines Mother   . Asthma Mother   . Migraines Father   . Heart disease Father   . Asthma Daughter   . Cancer Maternal Grandmother     Social History   Socioeconomic History  . Marital status: Married    Spouse name: Not on file  . Number of children: Not on file  . Years of education: Not on file  . Highest education level: Not on file  Occupational History  . Not on file  Tobacco Use  . Smoking status: Light Tobacco Smoker  . Smokeless tobacco: Never Used  Substance and Sexual Activity  . Alcohol  use: No    Comment: occasional on holidays  . Drug use: No  . Sexual activity: Yes    Partners: Male    Birth control/protection: Injection, Condom  Other Topics Concern  . Not on file  Social History Narrative   Lives in [place], with 2 children   Works at [employer], job requires frequent phone calling, works Tuesday - Friday   Social Determinants of Corporate investment banker Strain:   . Difficulty of Paying Living Expenses: Not on file  Food Insecurity:   . Worried About Programme researcher, broadcasting/film/video in the Last Year: Not on file  . Ran Out of Food in the Last Year: Not on file  Transportation Needs:   . Lack of Transportation (Medical): Not on file  . Lack of Transportation (Non-Medical): Not on file  Physical Activity:   . Days of Exercise per Week: Not on file  . Minutes of Exercise per Session: Not on file  Stress:   . Feeling of Stress : Not on file  Social Connections:   . Frequency of Communication with Friends and Family: Not on file  . Frequency of Social Gatherings with Friends and Family: Not on file  . Attends Religious Services: Not on file  . Active Member of Clubs or Organizations: Not on file  .  Attends Banker Meetings: Not on file  . Marital Status: Not on file  Intimate Partner Violence:   . Fear of Current or Ex-Partner: Not on file  . Emotionally Abused: Not on file  . Physically Abused: Not on file  . Sexually Abused: Not on file     PHYSICAL EXAM:  VS: BP 131/83   Pulse 84   Ht 5\' 5"  (1.651 m)   Wt 210 lb (95.3 kg)   BMI 34.95 kg/m  Physical Exam Gen: NAD, alert, cooperative with exam, well-appearing MSK:  Right wrist and arm: Tenderness to palpation over the volar aspect of the distal ulna and shaft. Limited pronation and supination. Limited flexion extension of the wrist. Neurovascularly intact.  Limited ultrasound: Right forearm and wrist:  No changes of the ulna. No changes of the dorsal compartment. Scanning of the  short axis of the ulna from proximal to distal shows an increased hyperemia at the ulnar aspect to suggest a tear of the pronator quadratus.  Summary: Concern for pronator quadratus tear.  Ultrasound and interpretation by , MD  1. Arm/wrist/hand 2. Right 3. Sugar tong splint 4. Ortho-glass 5. Applied by Dr. [name]    ASSESSMENT & PLAN:   Tear of pronator muscle of right upper extremity Pain started yesterday morning and has progressed through today.  It is severe in nature and exam is limited due to pain.  Concern for a tear of the pronator quadratus based on exam. -Counseled on supportive care. -Prednisone. -Sugar tong splint today. -Follow-up in 1 week.   `

## 2020-03-17 NOTE — Assessment & Plan Note (Signed)
Pain started yesterday morning and has progressed through today.  It is severe in nature and exam is limited due to pain.  Concern for a tear of the pronator quadratus based on exam. -Counseled on supportive care. -Prednisone. -Sugar tong splint today. -Follow-up in 1 week.

## 2020-03-24 ENCOUNTER — Encounter: Payer: Self-pay | Admitting: Family Medicine

## 2020-03-24 ENCOUNTER — Other Ambulatory Visit: Payer: Self-pay

## 2020-03-24 ENCOUNTER — Ambulatory Visit (INDEPENDENT_AMBULATORY_CARE_PROVIDER_SITE_OTHER): Payer: Commercial Managed Care - PPO | Admitting: Family Medicine

## 2020-03-24 VITALS — BP 154/101 | HR 85 | Ht 65.0 in | Wt 210.0 lb

## 2020-03-24 DIAGNOSIS — S56211A Strain of other flexor muscle, fascia and tendon at forearm level, right arm, initial encounter: Secondary | ICD-10-CM

## 2020-03-24 MED ORDER — GABAPENTIN 100 MG PO CAPS: 100.0000 mg | ORAL_CAPSULE | Freq: Three times a day (TID) | ORAL | 1 refills | Status: DC

## 2020-03-24 NOTE — Progress Notes (Signed)
[name] - 39 y.o. female MRN [id number]  Date of birth: 01-07-1981  SUBJECTIVE:  Including CC & ROS.  Chief Complaint  Patient presents with  . Follow-up    right arm    [name] is a 39 y.o. female that is presenting with worsening of her right arm pain.  Her pain is severe.  It is occurring in the forearm as well as the wrist.  She did get improvement of the swelling.  She pain wakes her up in the middle night.  She is unable to supinate or pronate without significant pain.  Unable to move her fingers without significant pain.  Having pain that radiates from the wrist proximally to the elbow.   Review of Systems See HPI   HISTORY: Past Medical, Surgical, Social, and Family History Reviewed & Updated per EMR.   Pertinent Historical Findings include:  Past Medical History:  Diagnosis Date  . Anemia   . Asthma   . Erosive esophagitis    distal esophagus, with low grade bleed 03/2009  . GERD (gastroesophageal reflux disease)   . Headache(784.0)   . History of migraine headaches   . IBS (irritable bowel syndrome)   . Infection    UTI    Past Surgical History:  Procedure Laterality Date  . LUMBAR LAMINECTOMY/DECOMPRESSION MICRODISCECTOMY Right 01/05/2016   Procedure: Right Lumbar three-four Microdiskectomy;  Surgeon: Coletta Memos, MD;  Location: [facility];  Service: Neurosurgery;  Laterality: Right;  right  . NO PAST SURGERIES      Family History  Problem Relation Age of Onset  . Migraines Mother   . Asthma Mother   . Migraines Father   . Heart disease Father   . Asthma Daughter   . Cancer Maternal Grandmother     Social History   Socioeconomic History  . Marital status: Married    Spouse name: Not on file  . Number of children: Not on file  . Years of education: Not on file  . Highest education level: Not on file  Occupational History  . Not on file  Tobacco Use  . Smoking status: Light Tobacco Smoker  . Smokeless tobacco: Never Used    Substance and Sexual Activity  . Alcohol use: No    Comment: occasional on holidays  . Drug use: No  . Sexual activity: Yes    Partners: Male    Birth control/protection: Injection, Condom  Other Topics Concern  . Not on file  Social History Narrative   Lives in [place], with 2 children   Works at [employer], job requires frequent phone calling, works Tuesday - Friday   Social Determinants of Corporate investment banker Strain:   . Difficulty of Paying Living Expenses: Not on file  Food Insecurity:   . Worried About Programme researcher, broadcasting/film/video in the Last Year: Not on file  . Ran Out of Food in the Last Year: Not on file  Transportation Needs:   . Lack of Transportation (Medical): Not on file  . Lack of Transportation (Non-Medical): Not on file  Physical Activity:   . Days of Exercise per Week: Not on file  . Minutes of Exercise per Session: Not on file  Stress:   . Feeling of Stress : Not on file  Social Connections:   . Frequency of Communication with Friends and Family: Not on file  . Frequency of Social Gatherings with Friends and Family: Not on file  . Attends Religious Services: Not on file  .  Active Member of Clubs or Organizations: Not on file  . Attends Banker Meetings: Not on file  . Marital Status: Not on file  Intimate Partner Violence:   . Fear of Current or Ex-Partner: Not on file  . Emotionally Abused: Not on file  . Physically Abused: Not on file  . Sexually Abused: Not on file     PHYSICAL EXAM:  VS: BP (!) 154/101   Pulse 85   Ht 5\' 5"  (1.651 m)   Wt 210 lb (95.3 kg)   BMI 34.95 kg/m  Physical Exam Gen: NAD, alert, cooperative with exam, well-appearing MSK:  Right arm: Swelling of the fingers. Tenderness to palpation in the ulnar aspect of the forearm as well as the anterior aspect. Limited flexion and extension of the wrist. Normal elbow range of motion. Limited grip strength. Neurovascularly intact  1. Forearm/wrist 2. Right   3. Short arm posterior splint  4. Ortho-glass 5. Applied by Dr.     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  Likely having a component of nerve irritation of the ulnar nerve that radiates proximally.  Swelling has improved somewhat. -Counseled on home exercise therapy and supportive care. -Splint placed today. -MRI to evaluate for muscle tear or occult fracture.

## 2020-03-24 NOTE — Patient Instructions (Signed)
Good to see you Please try the gabapentin at night. This medicine may make you sleepy. You can increase to 2 or 3 pills as you tolerate.    Please send me a message in MyChart with any questions or updates.  We will set up a virtual visit once the MRI is resulted.   --Dr. Jordan Likes

## 2020-03-24 NOTE — Assessment & Plan Note (Signed)
She is still having severe pain with any pronation and supination.  Likely having a component of nerve irritation of the ulnar nerve that radiates proximally.  Swelling has improved somewhat. -Counseled on home exercise therapy and supportive care. -Splint placed today. -MRI to evaluate for muscle tear or occult fracture.

## 2020-04-02 ENCOUNTER — Ambulatory Visit (INDEPENDENT_AMBULATORY_CARE_PROVIDER_SITE_OTHER): Payer: Commercial Managed Care - PPO

## 2020-04-02 ENCOUNTER — Other Ambulatory Visit: Payer: Self-pay

## 2020-04-02 DIAGNOSIS — S63591D Other specified sprain of right wrist, subsequent encounter: Secondary | ICD-10-CM

## 2020-04-02 DIAGNOSIS — S56211A Strain of other flexor muscle, fascia and tendon at forearm level, right arm, initial encounter: Secondary | ICD-10-CM

## 2020-04-05 ENCOUNTER — Other Ambulatory Visit: Payer: Self-pay

## 2020-04-05 ENCOUNTER — Telehealth (INDEPENDENT_AMBULATORY_CARE_PROVIDER_SITE_OTHER): Payer: Commercial Managed Care - PPO | Admitting: Family Medicine

## 2020-04-05 DIAGNOSIS — S63591D Other specified sprain of right wrist, subsequent encounter: Secondary | ICD-10-CM | POA: Diagnosis not present

## 2020-04-05 NOTE — Progress Notes (Signed)
Virtual Visit via Video Note  I connected with [name] on 04/05/20 at  8:10 AM EDT by a video enabled telemedicine application and verified that I am speaking with the correct person using two identifiers.   I discussed the limitations of evaluation and management by telemedicine and the availability of in person appointments. The patient expressed understanding and agreed to proceed.  Patient: home  Physician: office   History of Present Illness:  [name] is a 39 year old female that is presenting with still ongoing right wrist pain.  MRI of her forearm was unrevealing for any tear or dislocation.  She is still having persistent pain in the arm.  Seems more localized to the lateral wrist.  She has trouble with flexion and extension.  She is also having swelling over this area.   Observations/Objective:  Gen: NAD, alert, cooperative with exam, well-appearing  Assessment and Plan:  TFCC tear:  She is still having ongoing pain.  Seems more localized to the lateral wrist.  Seems it could be a tear of the TFCC as to why she is having trouble with flexion and extension of the wrist as well as the swelling.  X-ray of the wrist was normal.  MRI of the forearm did not reveal any tear or abnormality. -Counseled on supportive care. -Counseled on splint use. -Pursue an MRI of the wrist with contrast to evaluate for tear of the TFCC.  Can consider injection at that time.  Follow Up Instructions:    I discussed the assessment and treatment plan with the patient. The patient was provided an opportunity to ask questions and all were answered. The patient agreed with the plan and demonstrated an understanding of the instructions.   The patient was advised to call back or seek an in-person evaluation if the symptoms worsen or if the condition fails to improve as anticipated.   [name], MD

## 2020-04-05 NOTE — Assessment & Plan Note (Signed)
She is still having ongoing pain.  Seems more localized to the lateral wrist.  Seems it could be a tear of the TFCC as to why she is having trouble with flexion and extension of the wrist as well as the swelling.  X-ray of the wrist was normal.  MRI of the forearm did not reveal any tear or abnormality. -Counseled on supportive care. -Counseled on splint use. -Pursue an MRI of the wrist with contrast to evaluate for tear of the TFCC.  Can consider injection at that time.

## 2020-04-07 ENCOUNTER — Telehealth: Payer: Self-pay | Admitting: Family Medicine

## 2020-04-07 NOTE — Telephone Encounter (Signed)
Faxed Pre-auth request form for 2nd MRI to [employer] @ [phone]   Tracking # [id number]  --FYI .  glh

## 2020-04-13 ENCOUNTER — Telehealth: Payer: Self-pay | Admitting: Family Medicine

## 2020-04-13 NOTE — Telephone Encounter (Signed)
Automated message rcvd from [employer]/ [employer] precert company @ [phone] stating submitted medical information does NOT support medical necessity, service will be denied unless addt'l info submitted or Peer to Peer conducted.  Tracking# [id number] for [name]-- DOS 10-19-1980.  --Forwarding message to Dr. Jordan Likes.  --glh

## 2020-04-21 ENCOUNTER — Telehealth: Payer: Self-pay | Admitting: Family Medicine

## 2020-04-21 NOTE — Telephone Encounter (Signed)
Patient called states she rcvd her MRI approval letter Friday 10/15 but does Not know where the MRI is to be done--No one has called her.  --Patient ask to be called back by nurse to advise her on where she is going.  Forwarding message to med asst.  --glh

## 2020-04-26 ENCOUNTER — Encounter (HOSPITAL_COMMUNITY): Payer: Self-pay

## 2020-04-26 ENCOUNTER — Ambulatory Visit (HOSPITAL_COMMUNITY)
Admission: EM | Admit: 2020-04-26 | Discharge: 2020-04-26 | Disposition: A | Discharge status: Home / Self Care | Payer: Medicaid Other | Attending: Family Medicine | Admitting: Family Medicine

## 2020-04-26 ENCOUNTER — Other Ambulatory Visit: Payer: Self-pay

## 2020-04-26 DIAGNOSIS — J029 Acute pharyngitis, unspecified: Secondary | ICD-10-CM | POA: Diagnosis not present

## 2020-04-26 DIAGNOSIS — J020 Streptococcal pharyngitis: Secondary | ICD-10-CM | POA: Diagnosis present

## 2020-04-26 LAB — POCT RAPID STREP A, ED / UC: Streptococcus, Group A Screen (Direct): NEGATIVE

## 2020-04-26 MED ORDER — AMOXICILLIN-POT CLAVULANATE 400-57 MG/5ML PO SUSR: 875.0000 mg | Freq: Two times a day (BID) | ORAL | 0 refills | Status: AC

## 2020-04-26 MED ORDER — DEXAMETHASONE SODIUM PHOSPHATE 10 MG/ML IJ SOLN
10.0000 mg | Freq: Once | INTRAMUSCULAR | Status: AC
  Administered 2020-04-26: 10 mg via INTRAMUSCULAR

## 2020-04-26 MED ORDER — LIDOCAINE VISCOUS HCL 2 % MT SOLN: 15.0000 mL | OROMUCOSAL | 0 refills | Status: DC | PRN

## 2020-04-26 MED ORDER — DEXAMETHASONE SODIUM PHOSPHATE 10 MG/ML IJ SOLN
INTRAMUSCULAR | Status: AC
  Filled 2020-04-26: qty 1

## 2020-04-26 MED ORDER — ACETAMINOPHEN 160 MG/5ML PO ELIX: 640.0000 mg | ORAL_SOLUTION | Freq: Four times a day (QID) | ORAL | 0 refills | Status: DC | PRN

## 2020-04-26 MED ORDER — CETIRIZINE HCL 5 MG/5ML PO SOLN: 10.0000 mg | Freq: Every day | ORAL | 0 refills | Status: DC

## 2020-04-26 NOTE — Discharge Instructions (Signed)
Zyrtec daily for postnasal drip from sinuses. Treating you for strep throat based on symptoms and history.  Amoxicillin twice a day for 7 days. Tylenol every 4 hours as needed Lidocaine gargle and spit for pain prior to eating

## 2020-04-26 NOTE — ED Triage Notes (Signed)
Pt presents with sore throat x 1 week States when swallow the pain is worse and radiates to the right ear. Denies fever, cough, sob.

## 2020-04-26 NOTE — ED Provider Notes (Signed)
[facility]    CSN: [id number] Arrival date & time: 04/26/20  5681      History   Chief Complaint Chief Complaint  Patient presents with  . Sore Throat    HPI [name] is a 39 y.o. female.   Patient is a 39 year old female presents today with worsening sore throat the past week.  Does have a history of strep throat in the past.  Feels similar.  Reporting feels like "razor blades" when she swallows.  No trouble swallowing or breathing.  No fevers, chills, cough, chest congestion.  Pain radiates to right ear.  Taking liquid Tylenol, using hot tea and other remedies without much relief     Past Medical History:  Diagnosis Date  . Anemia   . Asthma   . Erosive esophagitis    distal esophagus, with low grade bleed 03/2009  . GERD (gastroesophageal reflux disease)   . Headache(784.0)   . History of migraine headaches   . IBS (irritable bowel syndrome)   . Infection    UTI    Patient Active Problem List   Diagnosis Date Noted  . 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 HNP (herniated nucleus pulposus), lumbar 01/05/2016  . Depo contraception 02/17/2015  . Chronic back pain greater than 3 months duration 12/14/2014  . Normal labor 06/12/2014  . Flu vaccine need 11/03/2013  . Hyperemesis complicating pregnancy, antepartum 11/03/2013  . Healthcare maintenance 01/13/2013  . Seasonal allergies 01/13/2013  . Mesenteric adenitis 02/05/2012  . Pes planus (flat feet) 06/11/2011  . Reflux esophagitis 03/28/2009  . Asthma, mild intermittent, well-controlled 03/11/2009    Past Surgical History:  Procedure Laterality Date  . LUMBAR LAMINECTOMY/DECOMPRESSION MICRODISCECTOMY Right 01/05/2016   Procedure: Right Lumbar three-four Microdiskectomy;  Surgeon: Coletta Memos, MD;  Location: [facility];  Service: Neurosurgery;  Laterality: Right;  right  . NO PAST SURGERIES      OB History    Gravida  3   Para  3     Term  3   Preterm      AB      Living  3     SAB      TAB      Ectopic      Multiple  0   Live Births  3            Home Medications    Prior to Admission medications   Medication Sig Start Date End Date Taking? Authorizing Provider  acetaminophen (TYLENOL) 160 MG/5ML elixir Take 20 mLs (640 mg total) by mouth 4 (four) times daily as needed for fever or pain. 04/26/20   Kimetha Trulson, Gloris Manchester A, NP  albuterol (VENTOLIN HFA) 108 (90 Base) MCG/ACT inhaler Inhale into the lungs every 6 (six) hours as needed for wheezing or shortness of breath.    [provider]  amoxicillin-clavulanate (AUGMENTIN) 400-57 MG/5ML suspension Take 10.9 mLs (875 mg total) by mouth 2 (two) times daily for 7 days. 04/26/20 05/03/20  Dahlia Byes A, NP  cetirizine HCl (ZYRTEC) 5 MG/5ML SOLN Take 10 mLs (10 mg total) by mouth daily. 04/26/20   Dahlia Byes A, NP  cyclobenzaprine (FLEXERIL) 10 MG tablet Take 10 mg by mouth 3 (three) times daily as needed for muscle spasms.    [provider]  gabapentin (NEURONTIN) 100 MG capsule Take 1 capsule (100 mg total) by mouth 3 (three) times daily. 03/24/20   Myra Rude, MD  lidocaine (  XYLOCAINE) 2 % solution Use as directed 15 mLs in the mouth or throat as needed for mouth pain. 04/26/20   Janace Aris, NP  medroxyPROGESTERone Acetate 150 MG/ML SUSY Inject 150 mLs into the skin every 3 (three) months. 08/12/17   [provider]  predniSONE (DELTASONE) 5 MG tablet Take 6 pills for first day, 5 pills second day, 4 pills third day, 3 pills fourth day, 2 pills the fifth day, and 1 pill sixth day. 03/17/20   Myra Rude, MD  traMADol (ULTRAM) 50 MG tablet Take 2 tablets (100 mg total) by mouth every 6 (six) hours as needed. 03/16/19   Eustace Johnstone, MD  omeprazole (PRILOSEC) 20 MG capsule Take 1 capsule (20 mg total) by mouth daily. Patient not taking: Reported on 12/23/2018 07/18/18 03/16/19  Rodriguez-Southworth, Nettie Elm, PA-C     Family History Family History  Problem Relation Age of Onset  . Migraines Mother   . Asthma Mother   . Migraines Father   . Heart disease Father   . Asthma Daughter   . Cancer Maternal Grandmother     Social History Social History   Tobacco Use  . Smoking status: Light Tobacco Smoker  . Smokeless tobacco: Never Used  Substance Use Topics  . Alcohol use: No    Comment: occasional on holidays  . Drug use: No     Allergies   Ibuprofen and Tramadol   Review of Systems Review of Systems   Physical Exam Triage Vital Signs ED Triage Vitals  Enc Vitals Group     BP 04/26/20 0840 (!) 134/96     Pulse Rate 04/26/20 0840 79     Resp 04/26/20 0840 18     Temp 04/26/20 0840 98.7 F (37.1 C)     Temp Source 04/26/20 0840 Oral     SpO2 04/26/20 0840 100 %     Weight --      Height --      Head Circumference --      Peak Flow --      Pain Score 04/26/20 0838 8     Pain Loc --      Pain Edu? --      Excl. in GC? --    No data found.  Updated Vital Signs BP (!) 134/96 (BP Location: Right Arm)   Pulse 79   Temp 98.7 F (37.1 C) (Oral)   Resp 18   SpO2 100%   Visual Acuity Right Eye Distance:   Left Eye Distance:   Bilateral Distance:    Right Eye Near:   Left Eye Near:    Bilateral Near:     Physical Exam Vitals and nursing note reviewed.  Constitutional:      General: She is not in acute distress.    Appearance: Normal appearance. She is not ill-appearing, toxic-appearing or diaphoretic.  HENT:     Head: Normocephalic.     Right Ear: There is impacted cerumen.     Left Ear: Tympanic membrane and ear canal normal.     Nose: Nose normal.     Mouth/Throat:     Pharynx: Posterior oropharyngeal erythema present.  Eyes:     Conjunctiva/sclera: Conjunctivae normal.  Cardiovascular:     Rate and Rhythm: Normal rate and regular rhythm.  Pulmonary:     Effort: Pulmonary effort is normal.     Breath sounds: Normal breath sounds.  Musculoskeletal:         General: Normal range of motion.  Cervical back: Normal range of motion.  Skin:    General: Skin is warm and dry.  Neurological:     Mental Status: She is alert.  Psychiatric:        Mood and Affect: Mood normal.      UC Treatments / Results  Labs (all labs ordered are listed, but only abnormal results are displayed) Labs Reviewed  CULTURE, GROUP A STREP [facility])  POCT RAPID STREP A, ED / UC    EKG   Radiology No results found.  Procedures Procedures (including critical care time)  Medications Ordered in UC Medications  dexamethasone (DECADRON) injection 10 mg (10 mg Intramuscular Given 04/26/20 0935)    Initial Impression / Assessment and Plan / UC Course  I have reviewed the triage vital signs and the nursing notes.  Pertinent labs & imaging results that were available during my care of the patient were reviewed by me and considered in my medical decision making (see chart for details).     Strep pharyngitis We will go ahead and cover for strep based on patient's past medical history of having negative point-of-care swab and positive strep culture.  Patient has had a significant amount of pain and throat is very erythematous. Decadron injection given here for pain, inflammation and swelling. Will treat with Augmentin and Tylenol liquid form Also recommended Zyrtec daily for present drip from sinuses. Lidocaine for pain prior to eating Warm liquids.  Follow up as needed for continued or worsening symptoms  Final Clinical Impressions(s) / UC Diagnoses   Final diagnoses:  Strep pharyngitis     Discharge Instructions     Zyrtec daily for postnasal drip from sinuses. Treating you for strep throat based on symptoms and history.  Amoxicillin twice a day for 7 days. Tylenol every 4 hours as needed Lidocaine gargle and spit for pain prior to eating    ED Prescriptions    Medication Sig Dispense Auth. Provider   amoxicillin-clavulanate (AUGMENTIN)  400-57 MG/5ML suspension Take 10.9 mLs (875 mg total) by mouth 2 (two) times daily for 7 days. 152.6 mL Jyden Kromer A, NP   acetaminophen (TYLENOL) 160 MG/5ML elixir Take 20 mLs (640 mg total) by mouth 4 (four) times daily as needed for fever or pain. 237 mL Lillyth Spong A, NP   cetirizine HCl (ZYRTEC) 5 MG/5ML SOLN Take 10 mLs (10 mg total) by mouth daily. 118 mL Kylii Ennis A, NP   lidocaine (XYLOCAINE) 2 % solution Use as directed 15 mLs in the mouth or throat as needed for mouth pain. 100 mL Dahlia Byes A, NP     PDMP not reviewed this encounter.   Janace Aris, NP 04/26/20 1053

## 2020-04-28 LAB — CULTURE, GROUP A STREP (THRC)

## 2020-05-02 ENCOUNTER — Ambulatory Visit (INDEPENDENT_AMBULATORY_CARE_PROVIDER_SITE_OTHER): Payer: Medicaid Other

## 2020-05-02 ENCOUNTER — Other Ambulatory Visit: Payer: Self-pay

## 2020-05-02 DIAGNOSIS — M25431 Effusion, right wrist: Secondary | ICD-10-CM | POA: Diagnosis not present

## 2020-05-02 DIAGNOSIS — S63591D Other specified sprain of right wrist, subsequent encounter: Secondary | ICD-10-CM

## 2020-05-02 MED ORDER — GADOBUTROL 1 MMOL/ML IV SOLN
9.5000 mL | Freq: Once | INTRAVENOUS | Status: AC | PRN
  Administered 2020-05-02: 9.5 mL via INTRAVENOUS

## 2020-05-04 ENCOUNTER — Other Ambulatory Visit: Payer: Self-pay

## 2020-05-04 ENCOUNTER — Encounter: Payer: Self-pay | Admitting: Family Medicine

## 2020-05-04 ENCOUNTER — Telehealth (INDEPENDENT_AMBULATORY_CARE_PROVIDER_SITE_OTHER): Payer: Commercial Managed Care - PPO | Admitting: Family Medicine

## 2020-05-04 DIAGNOSIS — S63091D Other subluxation of right wrist and hand, subsequent encounter: Secondary | ICD-10-CM | POA: Diagnosis not present

## 2020-05-04 NOTE — Assessment & Plan Note (Signed)
Based on the history it seemed that she had a subluxation of the ECU with a posttraumatic tenosynovitis observed on MRI. -Counseled on home exercise therapy and supportive care -We have splinted for some time and we will try an injection. -Could consider physical therapy.

## 2020-05-04 NOTE — Progress Notes (Signed)
Virtual Visit via Video Note  I connected with [name] on 05/04/20 at  8:00 AM EDT by a video enabled telemedicine application and verified that I am speaking with the correct person using two identifiers.  Location: Patient: home  Provider: office   I discussed the limitations of evaluation and management by telemedicine and the availability of in person appointments. The patient expressed understanding and agreed to proceed.  History of Present Illness:  Ms. Bertoni is a 39 year old female that is following up for her right wrist MRI.  The MRI was revealing for prominent soft tissue enhancement surrounding the extensor carpi ulnaris after the distal ulna for a suspicious posttraumatic tenosynovitis.  Her symptoms have improved but she is still experiencing pain.   Observations/Objective:  Gen: NAD, alert, cooperative with exam, well-appearing  Assessment and Plan:  Subluxation of ECU: Based on the history it seemed that she had a subluxation of the ECU with a posttraumatic tenosynovitis observed on MRI. -Counseled on home exercise therapy and supportive care -We have splinted for some time and we will try an injection. -Could consider physical therapy.   Follow Up Instructions:    I discussed the assessment and treatment plan with the patient. The patient was provided an opportunity to ask questions and all were answered. The patient agreed with the plan and demonstrated an understanding of the instructions.   The patient was advised to call back or seek an in-person evaluation if the symptoms worsen or if the condition fails to improve as anticipated.    [name], MD

## 2020-05-10 ENCOUNTER — Ambulatory Visit (INDEPENDENT_AMBULATORY_CARE_PROVIDER_SITE_OTHER): Payer: Commercial Managed Care - PPO | Admitting: Family Medicine

## 2020-05-10 ENCOUNTER — Ambulatory Visit: Payer: Self-pay

## 2020-05-10 ENCOUNTER — Encounter: Payer: Self-pay | Admitting: Family Medicine

## 2020-05-10 ENCOUNTER — Other Ambulatory Visit: Payer: Self-pay

## 2020-05-10 VITALS — BP 126/85 | HR 80 | Ht 65.0 in | Wt 215.0 lb

## 2020-05-10 DIAGNOSIS — S63091D Other subluxation of right wrist and hand, subsequent encounter: Secondary | ICD-10-CM

## 2020-05-10 MED ORDER — METHYLPREDNISOLONE ACETATE 40 MG/ML IJ SUSP
40.0000 mg | Freq: Once | INTRAMUSCULAR | Status: AC
  Administered 2020-05-10: 40 mg via INTRA_ARTICULAR

## 2020-05-10 NOTE — Patient Instructions (Signed)
Good to see you Please continue the brace Please try ice if needed   Please send me a message in MyChart with any questions or updates.  Please see me back in 4 weeks.   --Dr. Jordan Likes

## 2020-05-10 NOTE — Assessment & Plan Note (Signed)
It appears that most of her symptoms are associated with the ECU.  She has significant hyperemia on ultrasound.  Has swelling overlying the ulnar side of the wrist. -Counseled on home exercise therapy and supportive care. -Can continue brace. -Injection.

## 2020-05-10 NOTE — Progress Notes (Signed)
[name] - 39 y.o. female MRN [id number]  Date of birth: [birth year]  SUBJECTIVE:  Including CC & ROS.  Chief Complaint  Patient presents with  . Wrist Pain    right    [name] is a 39 y.o. female that is presenting for an injection for her ongoing wrist pain.   Review of Systems See HPI   HISTORY: Past Medical, Surgical, Social, and Family History Reviewed & Updated per EMR.   Pertinent Historical Findings include:  Past Medical History:  Diagnosis Date  . Anemia   . Asthma   . Erosive esophagitis    distal esophagus, with low grade bleed 03/2009  . GERD (gastroesophageal reflux disease)   . Headache(784.0)   . History of migraine headaches   . IBS (irritable bowel syndrome)   . Infection    UTI    Past Surgical History:  Procedure Laterality Date  . LUMBAR LAMINECTOMY/DECOMPRESSION MICRODISCECTOMY Right 01/05/2016   Procedure: Right Lumbar three-four Microdiskectomy;  Surgeon: Coletta Memos, MD;  Location: [facility];  Service: Neurosurgery;  Laterality: Right;  right  . NO PAST SURGERIES      Family History  Problem Relation Age of Onset  . Migraines Mother   . Asthma Mother   . Migraines Father   . Heart disease Father   . Asthma Daughter   . Cancer Maternal Grandmother     Social History   Socioeconomic History  . Marital status: Married    Spouse name: Not on file  . Number of children: Not on file  . Years of education: Not on file  . Highest education level: Not on file  Occupational History  . Not on file  Tobacco Use  . Smoking status: Light Tobacco Smoker  . Smokeless tobacco: Never Used  Substance and Sexual Activity  . Alcohol use: No    Comment: occasional on holidays  . Drug use: No  . Sexual activity: Yes    Partners: Male    Birth control/protection: Injection, Condom  Other Topics Concern  . Not on file  Social History Narrative   Lives in [place], with 2 children   Works at [employer], job requires  frequent phone calling, works Tuesday - Friday   Social Determinants of Corporate investment banker Strain:   . Difficulty of Paying Living Expenses: Not on file  Food Insecurity:   . Worried About Programme researcher, broadcasting/film/video in the Last Year: Not on file  . Ran Out of Food in the Last Year: Not on file  Transportation Needs:   . Lack of Transportation (Medical): Not on file  . Lack of Transportation (Non-Medical): Not on file  Physical Activity:   . Days of Exercise per Week: Not on file  . Minutes of Exercise per Session: Not on file  Stress:   . Feeling of Stress : Not on file  Social Connections:   . Frequency of Communication with Friends and Family: Not on file  . Frequency of Social Gatherings with Friends and Family: Not on file  . Attends Religious Services: Not on file  . Active Member of Clubs or Organizations: Not on file  . Attends Banker Meetings: Not on file  . Marital Status: Not on file  Intimate Partner Violence:   . Fear of Current or Ex-Partner: Not on file  . Emotionally Abused: Not on file  . Physically Abused: Not on file  . Sexually Abused: Not on file  PHYSICAL EXAM:  VS: BP 126/85   Pulse 80   Ht 5\' 5"  (1.651 m)   Wt 215 lb (97.5 kg)   BMI 35.78 kg/m  Physical Exam Gen: NAD, alert, cooperative with exam, well-appearing   Limited ultrasound: Right wrist:  Significant hyperemia associated with the ECU tendon  Summary: Findings suggest tenosynovitis of the ECU  Ultrasound and interpretation by , MD   Aspiration/Injection Procedure Note [name] September 30, 1980  Procedure: Injection Indications: Right wrist pain  Procedure Details Consent: Risks of procedure as well as the alternatives and risks of each were explained to the (patient/caregiver).  Consent for procedure obtained. Time Out: Verified patient identification, verified procedure, site/side was marked, verified correct patient position, special  equipment/implants available, medications/allergies/relevent history reviewed, required imaging and test results available.  Performed.  The area was cleaned with iodine and alcohol swabs.    The ECU tendon sheath was injected using 1 cc's of 40 mg Depo-Medrol and 1 cc's of 0.25% bupivacaine with a 25 1 1/2" needle.  Ultrasound was used. Images were obtained in short views showing the injection.     A sterile dressing was applied.  Patient did tolerate procedure well.     ASSESSMENT & PLAN:   Subluxation of extensor carpi ulnaris tendon, right, subsequent encounter It appears that most of her symptoms are associated with the ECU.  She has significant hyperemia on ultrasound.  Has swelling overlying the ulnar side of the wrist. -Counseled on home exercise therapy and supportive care. -Can continue brace. -Injection.

## 2020-05-17 ENCOUNTER — Telehealth: Payer: Self-pay | Admitting: Family Medicine

## 2020-05-17 NOTE — Telephone Encounter (Signed)
Provided work note.   Myra Rude, MD [facility] 05/17/2020, 1:36 PM

## 2020-05-17 NOTE — Telephone Encounter (Signed)
Patient called says provider told her to call when she is ready to return to work ( she is ) ---[employer] employer Administrator, sports) requires doctor clearance letter stating specifics :   Any work restrictions-- sitting, lifting, how many hours, etc.  Patient ask provider to call her if any questions.  --We will fax letter to her HR personnel Camelia Eng Dinwiddie @ [phone]  --glh

## 2020-05-23 ENCOUNTER — Ambulatory Visit: Payer: Medicaid Other | Admitting: Family Medicine

## 2020-05-23 ENCOUNTER — Telehealth: Payer: Self-pay | Admitting: Family Medicine

## 2020-05-23 NOTE — Progress Notes (Deleted)
[name] - 39 y.o. female MRN [id number]  Date of birth: 07-29-1980  SUBJECTIVE:  Including CC & ROS.  No chief complaint on file.   [name] is a 39 y.o. female that is  ***.  ***   Review of Systems See HPI   HISTORY: Past Medical, Surgical, Social, and Family History Reviewed & Updated per EMR.   Pertinent Historical Findings include:  Past Medical History:  Diagnosis Date  . Anemia   . Asthma   . Erosive esophagitis    distal esophagus, with low grade bleed 03/2009  . GERD (gastroesophageal reflux disease)   . Headache(784.0)   . History of migraine headaches   . IBS (irritable bowel syndrome)   . Infection    UTI    Past Surgical History:  Procedure Laterality Date  . LUMBAR LAMINECTOMY/DECOMPRESSION MICRODISCECTOMY Right 01/05/2016   Procedure: Right Lumbar three-four Microdiskectomy;  Surgeon: Coletta Memos, MD;  Location: [facility];  Service: Neurosurgery;  Laterality: Right;  right  . NO PAST SURGERIES      Family History  Problem Relation Age of Onset  . Migraines Mother   . Asthma Mother   . Migraines Father   . Heart disease Father   . Asthma Daughter   . Cancer Maternal Grandmother     Social History   Socioeconomic History  . Marital status: Married    Spouse name: Not on file  . Number of children: Not on file  . Years of education: Not on file  . Highest education level: Not on file  Occupational History  . Not on file  Tobacco Use  . Smoking status: Light Tobacco Smoker  . Smokeless tobacco: Never Used  Substance and Sexual Activity  . Alcohol use: No    Comment: occasional on holidays  . Drug use: No  . Sexual activity: Yes    Partners: Male    Birth control/protection: Injection, Condom  Other Topics Concern  . Not on file  Social History Narrative   Lives in [place], with 2 children   Works at [employer], job requires frequent phone calling, works Tuesday - Friday   Social Determinants of Manufacturing engineer Strain:   . Difficulty of Paying Living Expenses: Not on file  Food Insecurity:   . Worried About Programme researcher, broadcasting/film/video in the Last Year: Not on file  . Ran Out of Food in the Last Year: Not on file  Transportation Needs:   . Lack of Transportation (Medical): Not on file  . Lack of Transportation (Non-Medical): Not on file  Physical Activity:   . Days of Exercise per Week: Not on file  . Minutes of Exercise per Session: Not on file  Stress:   . Feeling of Stress : Not on file  Social Connections:   . Frequency of Communication with Friends and Family: Not on file  . Frequency of Social Gatherings with Friends and Family: Not on file  . Attends Religious Services: Not on file  . Active Member of Clubs or Organizations: Not on file  . Attends Banker Meetings: Not on file  . Marital Status: Not on file  Intimate Partner Violence:   . Fear of Current or Ex-Partner: Not on file  . Emotionally Abused: Not on file  . Physically Abused: Not on file  . Sexually Abused: Not on file     PHYSICAL EXAM:  VS: There were no vitals taken for this visit. Physical Exam  Gen: NAD, alert, cooperative with exam, well-appearing MSK:  ***      ASSESSMENT & PLAN:   No problem-specific Assessment & Plan notes found for this encounter.

## 2020-05-23 NOTE — Telephone Encounter (Signed)
Patient called seeking advice, states thought after she got Injection on 11-9/21 pain would decrease but today it's hurting worst than ever.  --Forwarding message to med asst to review w/provider & contact pt with suggestions @  Phone: [phone]  --glh

## 2020-05-24 ENCOUNTER — Encounter: Payer: Self-pay | Admitting: Family Medicine

## 2020-05-24 ENCOUNTER — Ambulatory Visit (INDEPENDENT_AMBULATORY_CARE_PROVIDER_SITE_OTHER): Payer: Commercial Managed Care - PPO | Admitting: Family Medicine

## 2020-05-24 ENCOUNTER — Other Ambulatory Visit: Payer: Self-pay

## 2020-05-24 DIAGNOSIS — S63091D Other subluxation of right wrist and hand, subsequent encounter: Secondary | ICD-10-CM | POA: Diagnosis not present

## 2020-05-24 MED ORDER — HYDROCODONE-ACETAMINOPHEN 5-325 MG PO TABS: 1.0000 | ORAL_TABLET | Freq: Three times a day (TID) | ORAL | 0 refills | Status: DC | PRN

## 2020-05-24 NOTE — Progress Notes (Signed)
[name] - 39 y.o. female MRN [id number]  Date of birth: 1981/04/18  SUBJECTIVE:  Including CC & ROS.  Chief Complaint  Patient presents with  . Follow-up    right wrist    [name] is a 39 y.o. female that is presenting with worsening of the right wrist pain.  She did not receive improvement with the steroid injection.  She is to continue to have swelling over the ulnar aspect of the wrist and limitations in her range of motion.  Continues to wear the Velcro brace.   Review of Systems See HPI   HISTORY: Past Medical, Surgical, Social, and Family History Reviewed & Updated per EMR.   Pertinent Historical Findings include:  Past Medical History:  Diagnosis Date  . Anemia   . Asthma   . Erosive esophagitis    distal esophagus, with low grade bleed 03/2009  . GERD (gastroesophageal reflux disease)   . Headache(784.0)   . History of migraine headaches   . IBS (irritable bowel syndrome)   . Infection    UTI    Past Surgical History:  Procedure Laterality Date  . LUMBAR LAMINECTOMY/DECOMPRESSION MICRODISCECTOMY Right 01/05/2016   Procedure: Right Lumbar three-four Microdiskectomy;  Surgeon: Coletta Memos, MD;  Location: [facility];  Service: Neurosurgery;  Laterality: Right;  right  . NO PAST SURGERIES      Family History  Problem Relation Age of Onset  . Migraines Mother   . Asthma Mother   . Migraines Father   . Heart disease Father   . Asthma Daughter   . Cancer Maternal Grandmother     Social History   Socioeconomic History  . Marital status: Married    Spouse name: Not on file  . Number of children: Not on file  . Years of education: Not on file  . Highest education level: Not on file  Occupational History  . Not on file  Tobacco Use  . Smoking status: Light Tobacco Smoker  . Smokeless tobacco: Never Used  Substance and Sexual Activity  . Alcohol use: No    Comment: occasional on holidays  . Drug use: No  . Sexual activity: Yes     Partners: Male    Birth control/protection: Injection, Condom  Other Topics Concern  . Not on file  Social History Narrative   Lives in [place], with 2 children   Works at [employer], job requires frequent phone calling, works Tuesday - Friday   Social Determinants of Corporate investment banker Strain:   . Difficulty of Paying Living Expenses: Not on file  Food Insecurity:   . Worried About Programme researcher, broadcasting/film/video in the Last Year: Not on file  . Ran Out of Food in the Last Year: Not on file  Transportation Needs:   . Lack of Transportation (Medical): Not on file  . Lack of Transportation (Non-Medical): Not on file  Physical Activity:   . Days of Exercise per Week: Not on file  . Minutes of Exercise per Session: Not on file  Stress:   . Feeling of Stress : Not on file  Social Connections:   . Frequency of Communication with Friends and Family: Not on file  . Frequency of Social Gatherings with Friends and Family: Not on file  . Attends Religious Services: Not on file  . Active Member of Clubs or Organizations: Not on file  . Attends Banker Meetings: Not on file  . Marital Status: Not on file  Intimate Partner Violence:   . Fear of Current or Ex-Partner: Not on file  . Emotionally Abused: Not on file  . Physically Abused: Not on file  . Sexually Abused: Not on file     PHYSICAL EXAM:  VS: BP (!) 145/100   Pulse 70   Ht 5\' 5"  (1.651 m)   Wt 215 lb (97.5 kg)   BMI 35.78 kg/m  Physical Exam Gen: NAD, alert, cooperative with exam, well-appearing MSK:  Left wrist: Obvious swelling over the ulnar aspect of the wrist. Limited range of motion in the wrist flexion and extension. Pain with ulnar deviation. Neurovascularly intact     ASSESSMENT & PLAN:   Subluxation of extensor carpi ulnaris tendon, right, subsequent encounter Worsening of her symptoms.  Unclear if she is having continued subluxations as to why the pain is still occurring and getting  worse.  Not much improvement with the steroid injection. -Counseled on supportive care. -Counseled on use of the brace. -Norco. -Referral to orthopedics.

## 2020-05-24 NOTE — Patient Instructions (Signed)
Good to see you Please use ice as needed  Please wean off of the gabapentin  Please use the norco as needed for severe pain  The orthopedic office should give you a call.   Please send me a message in MyChart with any questions or updates.  Please see [place] back as needed.   --Dr. Jordan Likes

## 2020-05-24 NOTE — Assessment & Plan Note (Addendum)
Worsening of her symptoms.  Unclear if she is having continued subluxations as to why the pain is still occurring and getting worse.  Not much improvement with the steroid injection. -Counseled on supportive care. -Counseled on use of the brace. -Norco. -Referral to orthopedics.

## 2020-06-14 ENCOUNTER — Ambulatory Visit: Payer: Medicaid Other | Admitting: Family Medicine

## 2021-01-29 ENCOUNTER — Encounter (HOSPITAL_COMMUNITY): Payer: Self-pay | Admitting: Emergency Medicine

## 2021-01-29 ENCOUNTER — Other Ambulatory Visit: Payer: Self-pay

## 2021-01-29 ENCOUNTER — Emergency Department (HOSPITAL_COMMUNITY): Payer: Medicaid Other

## 2021-01-29 ENCOUNTER — Observation Stay (HOSPITAL_COMMUNITY)
Admission: EM | Admit: 2021-01-29 | Discharge: 2021-01-30 | Disposition: A | Discharge status: Home / Self Care | Payer: Medicaid Other | Attending: Emergency Medicine | Admitting: Emergency Medicine

## 2021-01-29 DIAGNOSIS — J4520 Mild intermittent asthma, uncomplicated: Secondary | ICD-10-CM

## 2021-01-29 DIAGNOSIS — B9681 Helicobacter pylori [H. pylori] as the cause of diseases classified elsewhere: Secondary | ICD-10-CM | POA: Diagnosis not present

## 2021-01-29 DIAGNOSIS — Z20822 Contact with and (suspected) exposure to covid-19: Secondary | ICD-10-CM | POA: Insufficient documentation

## 2021-01-29 DIAGNOSIS — R1010 Upper abdominal pain, unspecified: Secondary | ICD-10-CM

## 2021-01-29 DIAGNOSIS — K922 Gastrointestinal hemorrhage, unspecified: Secondary | ICD-10-CM | POA: Diagnosis not present

## 2021-01-29 DIAGNOSIS — K2970 Gastritis, unspecified, without bleeding: Principal | ICD-10-CM | POA: Insufficient documentation

## 2021-01-29 DIAGNOSIS — J45909 Unspecified asthma, uncomplicated: Secondary | ICD-10-CM | POA: Insufficient documentation

## 2021-01-29 DIAGNOSIS — F17200 Nicotine dependence, unspecified, uncomplicated: Secondary | ICD-10-CM | POA: Diagnosis not present

## 2021-01-29 DIAGNOSIS — K920 Hematemesis: Secondary | ICD-10-CM | POA: Diagnosis not present

## 2021-01-29 DIAGNOSIS — K2100 Gastro-esophageal reflux disease with esophagitis, without bleeding: Secondary | ICD-10-CM | POA: Diagnosis present

## 2021-01-29 DIAGNOSIS — Z79899 Other long term (current) drug therapy: Secondary | ICD-10-CM | POA: Diagnosis not present

## 2021-01-29 DIAGNOSIS — K2901 Acute gastritis with bleeding: Secondary | ICD-10-CM

## 2021-01-29 DIAGNOSIS — K253 Acute gastric ulcer without hemorrhage or perforation: Secondary | ICD-10-CM

## 2021-01-29 LAB — CBC WITH DIFFERENTIAL/PLATELET
Abs Immature Granulocytes: 0.01 10*3/uL (ref 0.00–0.07)
Basophils Absolute: 0.1 10*3/uL (ref 0.0–0.1)
Basophils Relative: 1 %
Eosinophils Absolute: 0.1 10*3/uL (ref 0.0–0.5)
Eosinophils Relative: 1 %
HCT: 41.1 % (ref 36.0–46.0)
Hemoglobin: 13.9 g/dL (ref 12.0–15.0)
Immature Granulocytes: 0 %
Lymphocytes Relative: 36 %
Lymphs Abs: 2.9 10*3/uL (ref 0.7–4.0)
MCH: 31.5 pg (ref 26.0–34.0)
MCHC: 33.8 g/dL (ref 30.0–36.0)
MCV: 93.2 fL (ref 80.0–100.0)
Monocytes Absolute: 0.4 10*3/uL (ref 0.1–1.0)
Monocytes Relative: 5 %
Neutro Abs: 4.7 10*3/uL (ref 1.7–7.7)
Neutrophils Relative %: 57 %
Platelets: 385 10*3/uL (ref 150–400)
RBC: 4.41 MIL/uL (ref 3.87–5.11)
RDW: 13.9 % (ref 11.5–15.5)
WBC: 8.2 10*3/uL (ref 4.0–10.5)
nRBC: 0 % (ref 0.0–0.2)

## 2021-01-29 LAB — RESP PANEL BY RT-PCR (FLU A&B, COVID) ARPGX2
Influenza A by PCR: NEGATIVE
Influenza B by PCR: NEGATIVE
SARS Coronavirus 2 by RT PCR: NEGATIVE

## 2021-01-29 LAB — COMPREHENSIVE METABOLIC PANEL
ALT: 16 U/L (ref 0–44)
AST: 16 U/L (ref 15–41)
Albumin: 4 g/dL (ref 3.5–5.0)
Alkaline Phosphatase: 53 U/L (ref 38–126)
Anion gap: 9 (ref 5–15)
BUN: 7 mg/dL (ref 6–20)
CO2: 21 mmol/L — ABNORMAL LOW (ref 22–32)
Calcium: 9.6 mg/dL (ref 8.9–10.3)
Chloride: 109 mmol/L (ref 98–111)
Creatinine, Ser: 0.83 mg/dL (ref 0.44–1.00)
GFR, Estimated: >60 mL/min (ref >60)
Glucose, Bld: 102 mg/dL — ABNORMAL HIGH (ref 70–99)
Potassium: 4 mmol/L (ref 3.5–5.1)
Sodium: 139 mmol/L (ref 135–145)
Total Bilirubin: 0.1 mg/dL — ABNORMAL LOW (ref 0.3–1.2)
Total Protein: 7.4 g/dL (ref 6.5–8.1)

## 2021-01-29 LAB — I-STAT BETA HCG BLOOD, ED (MC, WL, AP ONLY): I-stat hCG, quantitative: <5 m[IU]/mL (ref <5)

## 2021-01-29 LAB — URINALYSIS, ROUTINE W REFLEX MICROSCOPIC
Bilirubin Urine: NEGATIVE
Glucose, UA: NEGATIVE mg/dL
Ketones, ur: NEGATIVE mg/dL
Leukocytes,Ua: NEGATIVE
Nitrite: NEGATIVE
Protein, ur: NEGATIVE mg/dL
Specific Gravity, Urine: 1.021 (ref 1.005–1.030)
pH: 6 (ref 5.0–8.0)

## 2021-01-29 LAB — LIPASE, BLOOD: Lipase: 24 U/L (ref 11–51)

## 2021-01-29 MED ORDER — HYDROMORPHONE HCL 1 MG/ML IJ SOLN
1.0000 mg | Freq: Once | INTRAMUSCULAR | Status: AC
  Administered 2021-01-29: 1 mg via INTRAVENOUS
  Filled 2021-01-29: qty 1

## 2021-01-29 MED ORDER — SODIUM CHLORIDE 0.9 % IV BOLUS
1000.0000 mL | Freq: Once | INTRAVENOUS | Status: AC
  Administered 2021-01-29: 1000 mL via INTRAVENOUS

## 2021-01-29 MED ORDER — ACETAMINOPHEN 650 MG RE SUPP: 650.0000 mg | Freq: Four times a day (QID) | RECTAL | Status: DC | PRN

## 2021-01-29 MED ORDER — HYDROCODONE-ACETAMINOPHEN 5-325 MG PO TABS
1.0000 | ORAL_TABLET | Freq: Three times a day (TID) | ORAL | Status: DC | PRN
  Administered 2021-01-30 (×3): 1 via ORAL
  Filled 2021-01-29 (×4): qty 1

## 2021-01-29 MED ORDER — ACETAMINOPHEN 325 MG PO TABS: 650.0000 mg | ORAL_TABLET | Freq: Four times a day (QID) | ORAL | Status: DC | PRN

## 2021-01-29 MED ORDER — SODIUM CHLORIDE 0.9% FLUSH
3.0000 mL | Freq: Two times a day (BID) | INTRAVENOUS | Status: DC
  Administered 2021-01-30: 3 mL via INTRAVENOUS

## 2021-01-29 MED ORDER — SODIUM CHLORIDE 0.9 % IV SOLN: INTRAVENOUS | Status: AC

## 2021-01-29 MED ORDER — MORPHINE SULFATE (PF) 4 MG/ML IV SOLN
4.0000 mg | Freq: Once | INTRAVENOUS | Status: AC
Start: 2021-01-29 — End: 2021-01-29
  Administered 2021-01-29: 4 mg via INTRAVENOUS
  Filled 2021-01-29: qty 1

## 2021-01-29 MED ORDER — FENTANYL CITRATE (PF) 100 MCG/2ML IJ SOLN
75.0000 ug | Freq: Once | INTRAMUSCULAR | Status: AC
  Administered 2021-01-29: 75 ug via INTRAVENOUS
  Filled 2021-01-29: qty 2

## 2021-01-29 MED ORDER — ONDANSETRON 4 MG PO TBDP
4.0000 mg | ORAL_TABLET | Freq: Once | ORAL | Status: AC
  Administered 2021-01-29: 4 mg via ORAL
  Filled 2021-01-29: qty 1

## 2021-01-29 MED ORDER — ONDANSETRON HCL 4 MG PO TABS: 4.0000 mg | ORAL_TABLET | Freq: Four times a day (QID) | ORAL | Status: DC | PRN

## 2021-01-29 MED ORDER — ONDANSETRON HCL 4 MG/2ML IJ SOLN: 4.0000 mg | Freq: Four times a day (QID) | INTRAMUSCULAR | Status: DC | PRN

## 2021-01-29 MED ORDER — SENNOSIDES-DOCUSATE SODIUM 8.6-50 MG PO TABS: 1.0000 | ORAL_TABLET | Freq: Every evening | ORAL | Status: DC | PRN

## 2021-01-29 MED ORDER — PANTOPRAZOLE INFUSION (NEW) - SIMPLE MED
8.0000 mg/h | INTRAVENOUS | Status: DC
  Administered 2021-01-29 – 2021-01-30 (×2): 8 mg/h via INTRAVENOUS
  Filled 2021-01-29 (×2): qty 80

## 2021-01-29 MED ORDER — ALBUTEROL SULFATE HFA 108 (90 BASE) MCG/ACT IN AERS
1.0000 | INHALATION_SPRAY | Freq: Four times a day (QID) | RESPIRATORY_TRACT | Status: DC | PRN
  Administered 2021-01-30: 2 via RESPIRATORY_TRACT
  Filled 2021-01-29: qty 6.7

## 2021-01-29 MED ORDER — PANTOPRAZOLE SODIUM 40 MG IV SOLR
40.0000 mg | Freq: Once | INTRAVENOUS | Status: AC
  Administered 2021-01-29: 40 mg via INTRAVENOUS
  Filled 2021-01-29: qty 40

## 2021-01-29 MED ORDER — HYDROMORPHONE HCL 1 MG/ML IJ SOLN
1.0000 mg | INTRAMUSCULAR | Status: DC | PRN
  Administered 2021-01-30 (×2): 1 mg via INTRAVENOUS
  Filled 2021-01-29 (×2): qty 1

## 2021-01-29 MED ORDER — IOHEXOL 350 MG/ML SOLN
100.0000 mL | Freq: Once | INTRAVENOUS | Status: AC | PRN
  Administered 2021-01-29: 100 mL via INTRAVENOUS

## 2021-01-29 NOTE — ED Notes (Signed)
Received verbal report from Zoe H RN at this time 

## 2021-01-29 NOTE — H&P (Signed)
History and Physical    [name] PNT:[id number] DOB: 1981-03-03 DOA: 01/29/2021  PCP: Pcp, No Sees Dr. Langston Masker, OB/Gyn for primary needs Patient coming from: Home  Chief Complaint: coffee ground emesis  HPI: [name] is a 40 y.o. female with medical history significant of GERD/esophagitis, IBS, migraine, asthma who presents for coffee ground emesis.  She reports waking up from sleep today and immediately vomiting up brownish fluid.  She then vomited twice more into the toilet bowl, brownish liquid.  She has a history of esophagitis from 2010 which required endoscopy and cauterization.  She is no longer on a PPI, but reports daily heartburn.  Further symptoms include epigastric and LUQ pain which has been modestly relieved with IV narcotics in the ED.  She is not taking any NSAIDs at this time per report.  She has been receiving norco for her wrist.  She is s/p surgery and wearing a pressure wrap at this time.  Denies chest pain other than heartburn, recent fever, chills, diarrhea, dysuria, lower abdominal pain, swelling, lymphadenopathy, recent travel, recent steroid use.  She has no bruising or abnormal pigment.  No petechiae.  She does note some chronic constipation.  Reports eating chili and a homemade philly cheese steak last evening, nothing outside of her normal.   ED Course: in the ED, she was noted to have a normal H/H.  CT abdomen and CXR did not show any acute findings.  She was treated for pain but still in significant pain per report.  GI was consulted and will plan to see her in the AM.  She was started on a protonix drip.   Review of Systems: As per HPI otherwise all other systems reviewed and are negative.  Past Medical History:  Diagnosis Date   Anemia    Asthma    Erosive esophagitis    distal esophagus, with low grade bleed 03/2009   GERD (gastroesophageal reflux disease)    Headache(784.0)    History of migraine headaches    IBS (irritable bowel syndrome)     Infection    UTI    Past Surgical History:  Procedure Laterality Date   LUMBAR LAMINECTOMY/DECOMPRESSION MICRODISCECTOMY Right 01/05/2016   Procedure: Right Lumbar three-four Microdiskectomy;  Surgeon: Coletta Memos, MD;  Location: [facility];  Service: Neurosurgery;  Laterality: Right;  right   WRIST SURGERY Right     Social History  reports that she has been smoking. She has never used smokeless tobacco. She reports that she does not drink alcohol and does not use drugs.  Rare black and mild.   Allergies  Allergen Reactions   Ibuprofen Other (See Comments)    Ulcer flare up    Tramadol Hives    Family History  Problem Relation Age of Onset   Migraines Mother    Asthma Mother    Migraines Father    Heart disease Father    Asthma Daughter    Cancer Maternal Grandmother     Prior to Admission medications   Medication Sig Start Date End Date Taking? Authorizing Provider  albuterol (VENTOLIN HFA) 108 (90 Base) MCG/ACT inhaler Inhale into the lungs every 6 (six) hours as needed for wheezing or shortness of breath.    [provider]  cetirizine HCl (ZYRTEC) 5 MG/5ML SOLN Take 10 mLs (10 mg total) by mouth daily. 04/26/20   Janace Aris, NP                HYDROcodone-acetaminophen (NORCO/VICODIN) [phone]  MG tablet Take 1 tablet by mouth every 8 (eight) hours as needed. 05/24/20   Myra Rude, MD         medroxyPROGESTERone Acetate 150 MG/ML SUSY Inject 150 mLs into the skin every 3 (three) months. 08/12/17   [provider]                  Physical Exam: Vitals:   01/29/21 1830 01/29/21 2015 01/29/21 2030 01/29/21 2030  BP: 133/78 (!) 147/84 (!) 163/90   Pulse: 71 91 74   Resp: 14 16 13    Temp:    98.8 F (37.1 C)  TempSrc:    Oral  SpO2: 97% 98% 100%   Weight:    97.5 kg  Height:    5\' 5"  (1.651 m)    Constitutional: NAD, calm, comfortable Eyes: lids and conjunctivae normal, no pallor ENMT: Mucous membranes are moist. Posterior  pharynx clear of any exudate or lesions. Neck: normal, supple.  Mild thyromegaly noted, no lymphadenopathy Respiratory: Breathing comfortably, no wheezing, no crackles Cardiovascular: RR, NR, no murmur noted, no pedal edema.  Abdomen: + TTP in the epigastrium/LUQ.  Soft, +BS, non distended Musculoskeletal: no clubbing / cyanosis. Normal tone and bulk for age.  Skin: no rashes, lesions, ulcers on exposed skin.  Neurologic: CN grossly intact, she is moving all extremities and has normal speech Psychiatric: Normal judgment and insight. Alert and oriented x 3. Normal mood.    Labs on Admission: I have personally reviewed following labs and imaging studies  CBC: Recent Labs  Lab 01/29/21 1223  WBC 8.2  NEUTROABS 4.7  HGB 13.9  HCT 41.1  MCV 93.2  PLT 385    Basic Metabolic Panel: Recent Labs  Lab 01/29/21 1223  NA 139  K 4.0  CL 109  CO2 21*  GLUCOSE 102*  BUN 7  CREATININE 0.83  CALCIUM 9.6    GFR: Estimated Creatinine Clearance: 102 mL/min (by C-G formula based on SCr of 0.83 mg/dL).  Liver Function Tests: Recent Labs  Lab 01/29/21 1223  AST 16  ALT 16  ALKPHOS 53  BILITOT 0.1*  PROT 7.4  ALBUMIN 4.0    Urine analysis:    Component Value Date/Time   COLORURINE YELLOW 01/29/2021 1223   APPEARANCEUR HAZY (A) 01/29/2021 1223   LABSPEC 1.021 01/29/2021 1223   PHURINE 6.0 01/29/2021 1223   GLUCOSEU NEGATIVE 01/29/2021 1223   HGBUR SMALL (A) 01/29/2021 1223   HGBUR negative 06/14/2009 1430   BILIRUBINUR NEGATIVE 01/29/2021 1223   KETONESUR NEGATIVE 01/29/2021 1223   PROTEINUR NEGATIVE 01/29/2021 1223   UROBILINOGEN 0.2 04/02/2015 0425   NITRITE NEGATIVE 01/29/2021 1223   LEUKOCYTESUR NEGATIVE 01/29/2021 1223    Radiological Exams on Admission: DG Chest 2 View  Result Date: 01/29/2021 CLINICAL DATA:  Chest pain, nausea and vomiting. EXAM: CHEST - 2 VIEW COMPARISON:  Chest radiograph 07/18/2018 FINDINGS: The heart size and mediastinal contours are  within normal limits. The lungs are clear. No pneumothorax or pleural effusion. No acute finding in the visualized skeleton. IMPRESSION: No active cardiopulmonary disease. Electronically Signed   By: 01/31/2021 M.D.   On: 01/29/2021 16:22   CT ABDOMEN PELVIS W CONTRAST  Result Date: 01/29/2021 CLINICAL DATA:  Abdominal pain. Hematemesis. EXAM: CT ABDOMEN AND PELVIS WITH CONTRAST TECHNIQUE: Multidetector CT imaging of the abdomen and pelvis was performed using the standard protocol following bolus administration of intravenous contrast. CONTRAST:  01/31/2021 OMNIPAQUE IOHEXOL 350 MG/ML SOLN COMPARISON:  Noncontrast CT on 02/05/2012  FINDINGS: Lower Chest: No acute findings. Hepatobiliary: No hepatic masses identified. Gallbladder is unremarkable. No evidence of biliary ductal dilatation. Pancreas:  No mass or inflammatory changes. Spleen: Within normal limits in size and appearance. Adrenals/Urinary Tract: No masses identified. No evidence of ureteral calculi or hydronephrosis. Stomach/Bowel: No evidence of obstruction, inflammatory process or abnormal fluid collections. Normal appendix visualized. Vascular/Lymphatic: No pathologically enlarged lymph nodes. No acute vascular findings. Reproductive:  No mass or other significant abnormality. Other: Small midline epigastric and umbilical ventral hernias are again seen, both containing only fat. Musculoskeletal:  No suspicious bone lesions identified. IMPRESSION: No acute findings within the abdomen or pelvis. Stable small epigastric and umbilical ventral hernias, both containing only fat. Electronically Signed   By: Danae Orleans M.D.   On: 01/29/2021 18:04     Assessment/Plan Acute upper GI bleeding - H/H stable, recheck in the AM - GI to see in the AM.  EDP contacted Dr. Orvan Falconer - Protonix drip - Zofran for nausea - Pain control with tylenol, hydrocodone-apap, severe with hydromorphone - Patient consented to blood transfusion if needed - Monitor on  telemetry  Reflux esophagitis - PPI ggt - Probably needs lifelong PPI    Asthma, mild intermittent, well-controlled - Albuterol inhaler PRN  H/O Migraine - Pain regimen as above  Recent wrist surgery - pain regimen as above - Continue brace       DVT prophylaxis: SCDs  Code Status:   Full  Family Communication:  Daughter at bedside  Disposition Plan:   Patient is from:  Home  Anticipated DC to:  Home  Anticipated DC date:  01/30/21 unless endoscopy needed  Anticipated DC barriers: Possible procedure  Consults called:  Dr. Orvan Falconer, GI Admission status:  Obv, medical telemetry   Severity of Illness: The appropriate patient status for this patient is OBSERVATION. Observation status is judged to be reasonable and necessary in order to provide the required intensity of service to ensure the patient's safety. The patient's presenting symptoms, physical exam findings, and initial radiographic and laboratory data in the context of their medical condition is felt to place them at decreased risk for further clinical deterioration. Furthermore, it is anticipated that the patient will be medically stable for discharge from the hospital within 2 midnights of admission. The following factors support the patient status of observation.   " The patient's presenting symptoms include coffee ground emesis. " The physical exam findings include Abdominal pain. " The initial radiographic and laboratory data are benign.    Debe Coder MD [facility]  How to contact the [facility] Attending or Consulting provider 7A - 7P or covering provider during after hours 7P -7A, for this patient?   Check the care team in [facility] and look for a) attending/consulting [facility] provider listed and b) the [facility] team listed Log into [email or web address] and use [place]'s universal password to access. If you do not have the password, please contact the hospital operator. Locate the [facility] provider you are looking for under [facility] and page to a number that you can be directly reached. If you still have difficulty reaching the provider, please page the [facility] (Director on Call) for the Hospitalists listed on amion for assistance.  01/29/2021, 9:11 PM

## 2021-01-29 NOTE — ED Triage Notes (Signed)
C/o upper abd pain since this morning with nausea, vomiting, and constipation.  States she vomited blood this morning.

## 2021-01-29 NOTE — Progress Notes (Signed)
Received patient from ED, AOx4, denies abdominal pain, VSS, ambulatory, oriented to room, bed controls and plan of care.  Patient verbalized understanding of plan of care and call bell within reach.

## 2021-01-29 NOTE — ED Provider Notes (Signed)
Emergency Medicine Provider Triage Evaluation Note  [name] , Archer 40 y.o. female  was evaluated in triage.  Pt complains of abd pain. Epigastric. Multiple episode of coffee ground emesis. No bloody stool. No chronic NSAID use, etoh use. Hx of GI bleed previously.  Review of Systems  Positive: Abd pain, coffee ground emesis Negative: Dark stool, back pain  Physical Exam  BP (!) 143/102 (BP Location: Left Arm)   Pulse 88   Temp 98.7 F (37.1 C) (Oral)   Resp 20   SpO2 100%  Gen:   Awake, no distress   Resp:  Normal effort  MSK:   Moves extremities without difficulty  ABD:  Tenderness to epigastric region Other:    Medical Decision Making  Medically screening exam initiated at 12:22 PM.  Appropriate orders placed.  [name] was informed that the remainder of the evaluation will be completed by another provider, this initial triage assessment does not replace that evaluation, and the importance of remaining in the ED until their evaluation is complete.  Abd pain, coffee ground emesis   [name] Wrubel A, PA-C 01/29/21 1223    [name] Sleeper, MD 01/30/21 [phone]

## 2021-01-29 NOTE — ED Provider Notes (Signed)
[facility] Provider Note   CSN: [id number] Arrival date & time: 01/29/21  1151     History Chief Complaint  Patient presents with   Abdominal Pain    Nani GasserFealethea D Stapel is a 40 y.o. female with PMH/o Anemia, asthma, GERD, Migraine, IBS who presents for evaluation of hematemesis.  She reports this morning she has had 3 times episode of what she describes as dark, coffee-ground emesis.  She states her last episode was around 11 AM.  She also reports some upper abdominal pain.  She states she has a history of ulcer.  She reports that about 10 years ago, she was having bloody stools.  She followed up with GI did endoscopy showed esophagitis with an active bleeding.  She states that she was on a PPI but states she has not been on it for several years.  She has not seen GI in several years.  She states that she was in her normal state of health prior to onset of symptoms.  She now reports some soreness in her chest after vomiting.  She states her upper abdominal pain has been constant since this morning.  No diarrhea, bloody bowel movements.  Denies any fevers.  Is not on blood thinners, denies any chronic NSAID use.  Denies any smoking.  Denies any difficulty breathing, dysuria, hematuria.  The history is provided by the patient.      Past Medical History:  Diagnosis Date   Anemia    Asthma    Erosive esophagitis    distal esophagus, with low grade bleed 03/2009   GERD (gastroesophageal reflux disease)    Headache(784.0)    History of migraine headaches    IBS (irritable bowel syndrome)    Infection    UTI    Patient Active Problem List   Diagnosis Date Noted   Subluxation of extensor carpi ulnaris tendon, right, subsequent encounter 03/17/2020   HNP (herniated nucleus pulposus), lumbar 01/05/2016   Depo contraception 02/17/2015   Chronic back pain greater than 3 months duration 12/14/2014   Normal labor 06/12/2014   Flu vaccine need  11/03/2013   Hyperemesis complicating pregnancy, antepartum 11/03/2013   Healthcare maintenance 01/13/2013   Seasonal allergies 01/13/2013   Mesenteric adenitis 02/05/2012   Pes planus (flat feet) 06/11/2011   Reflux esophagitis 03/28/2009   Asthma, mild intermittent, well-controlled 03/11/2009    Past Surgical History:  Procedure Laterality Date   LUMBAR LAMINECTOMY/DECOMPRESSION MICRODISCECTOMY Right 01/05/2016   Procedure: Right Lumbar three-four Microdiskectomy;  Surgeon: Coletta MemosKyle Cabbell, MD;  Location: [facility];  Service: Neurosurgery;  Laterality: Right;  right   NO PAST SURGERIES       OB History     Gravida  3   Para  3   Term  3   Preterm      AB      Living  3      SAB      IAB      Ectopic      Multiple  0   Live Births  3           Family History  Problem Relation Age of Onset   Migraines Mother    Asthma Mother    Migraines Father    Heart disease Father    Asthma Daughter    Cancer Maternal Grandmother     Social History   Tobacco Use   Smoking status: Light Smoker   Smokeless tobacco: Never  Substance  Use Topics   Alcohol use: No    Comment: occasional on holidays   Drug use: No    Home Medications Prior to Admission medications   Medication Sig Start Date End Date Taking? Authorizing Provider  albuterol (VENTOLIN HFA) 108 (90 Base) MCG/ACT inhaler Inhale into the lungs every 6 (six) hours as needed for wheezing or shortness of breath.    [provider]  cetirizine HCl (ZYRTEC) 5 MG/5ML SOLN Take 10 mLs (10 mg total) by mouth daily. 04/26/20   Dahlia Byes A, NP  cyclobenzaprine (FLEXERIL) 10 MG tablet Take 10 mg by mouth 3 (three) times daily as needed for muscle spasms.    [provider]  gabapentin (NEURONTIN) 100 MG capsule Take 1 capsule (100 mg total) by mouth 3 (three) times daily. 03/24/20   Myra Rude, MD  HYDROcodone-acetaminophen (NORCO/VICODIN) 5-325 MG tablet Take 1 tablet by mouth every 8  (eight) hours as needed. 05/24/20   Myra Rude, MD  lidocaine (XYLOCAINE) 2 % solution Use as directed 15 mLs in the mouth or throat as needed for mouth pain. 04/26/20   Janace Aris, NP  medroxyPROGESTERone Acetate 150 MG/ML SUSY Inject 150 mLs into the skin every 3 (three) months. 08/12/17   [provider]  predniSONE (DELTASONE) 5 MG tablet Take 6 pills for first day, 5 pills second day, 4 pills third day, 3 pills fourth day, 2 pills the fifth day, and 1 pill sixth day. 03/17/20   Myra Rude, MD  omeprazole (PRILOSEC) 20 MG capsule Take 1 capsule (20 mg total) by mouth daily. Patient not taking: Reported on 12/23/2018 07/18/18 03/16/19  Rodriguez-Southworth, Nettie Elm, PA-C    Allergies    Ibuprofen and Tramadol  Review of Systems   Review of Systems  Constitutional:  Negative for fever.  Respiratory:  Negative for cough and shortness of breath.   Cardiovascular:  Negative for chest pain.  Gastrointestinal:  Positive for abdominal pain, nausea and vomiting. Negative for blood in stool.  Genitourinary:  Negative for dysuria and hematuria.  Neurological:  Negative for headaches.  All other systems reviewed and are negative.  Physical Exam Updated Vital Signs BP (!) 163/90   Pulse 74   Temp 98.8 F (37.1 C) (Oral)   Resp 13   Ht 5\' 5"  (1.651 m)   Wt 97.5 kg   SpO2 100%   BMI 35.78 kg/m   Physical Exam Vitals and nursing note reviewed.  Constitutional:      Appearance: Normal appearance. She is well-developed.  HENT:     Head: Normocephalic and atraumatic.  Eyes:     General: Lids are normal.     Conjunctiva/sclera: Conjunctivae normal.     Pupils: Pupils are equal, round, and reactive to light.  Cardiovascular:     Rate and Rhythm: Normal rate and regular rhythm.     Pulses: Normal pulses.     Heart sounds: Normal heart sounds. No murmur heard.   No friction rub. No gallop.  Pulmonary:     Effort: Pulmonary effort is normal.     Breath sounds:  Normal breath sounds.     Comments: Lungs clear to auscultation bilaterally.  Symmetric chest rise.  No wheezing, rales, rhonchi. Abdominal:     Palpations: Abdomen is soft. Abdomen is not rigid.     Tenderness: There is abdominal tenderness in the right upper quadrant, epigastric area and left upper quadrant. There is no guarding.     Comments: Abdomen is soft,  non-distended.  Tenderness palpation in upper abdomen.  No rigidity, guarding.  No CVA tenderness.  Musculoskeletal:        General: Normal range of motion.     Cervical back: Full passive range of motion without pain.  Skin:    General: Skin is warm and dry.     Capillary Refill: Capillary refill takes less than 2 seconds.  Neurological:     Mental Status: She is alert and oriented to person, place, and time.  Psychiatric:        Speech: Speech normal.    ED Results / Procedures / Treatments   Labs (all labs ordered are listed, but only abnormal results are displayed) Labs Reviewed  COMPREHENSIVE METABOLIC PANEL - Abnormal; Notable for the following components:      Result Value   CO2 21 (*)    Glucose, Bld 102 (*)    Total Bilirubin 0.1 (*)    All other components within normal limits  URINALYSIS, ROUTINE W REFLEX MICROSCOPIC - Abnormal; Notable for the following components:   APPearance HAZY (*)    Hgb urine dipstick SMALL (*)    Bacteria, UA RARE (*)    All other components within normal limits  RESP PANEL BY RT-PCR (FLU A&B, COVID) ARPGX2  CBC WITH DIFFERENTIAL/PLATELET  LIPASE, BLOOD  OCCULT BLOOD GASTRIC / DUODENUM (SPECIMEN CUP)  I-STAT BETA HCG BLOOD, ED ([facility], [facility], [facility] ONLY)    EKG None  Radiology DG Chest 2 View  Result Date: 01/29/2021 CLINICAL DATA:  Chest pain, nausea and vomiting. EXAM: CHEST - 2 VIEW COMPARISON:  Chest radiograph 07/18/2018 FINDINGS: The heart size and mediastinal contours are within normal limits. The lungs are clear. No pneumothorax or pleural effusion. No acute finding in the  visualized skeleton. IMPRESSION: No active cardiopulmonary disease. Electronically Signed   By: Emmaline Kluver M.D.   On: 01/29/2021 16:22   CT ABDOMEN PELVIS W CONTRAST  Result Date: 01/29/2021 CLINICAL DATA:  Abdominal pain. Hematemesis. EXAM: CT ABDOMEN AND PELVIS WITH CONTRAST TECHNIQUE: Multidetector CT imaging of the abdomen and pelvis was performed using the standard protocol following bolus administration of intravenous contrast. CONTRAST:  OMNIPAQUE IOHEXOL 350 MG/ML SOLN COMPARISON:  Noncontrast CT on 02/05/2012 FINDINGS: Lower Chest: No acute findings. Hepatobiliary: No hepatic masses identified. Gallbladder is unremarkable. No evidence of biliary ductal dilatation. Pancreas:  No mass or inflammatory changes. Spleen: Within normal limits in size and appearance. Adrenals/Urinary Tract: No masses identified. No evidence of ureteral calculi or hydronephrosis. Stomach/Bowel: No evidence of obstruction, inflammatory process or abnormal fluid collections. Normal appendix visualized. Vascular/Lymphatic: No pathologically enlarged lymph nodes. No acute vascular findings. Reproductive:  No mass or other significant abnormality. Other: Small midline epigastric and umbilical ventral hernias are again seen, both containing only fat. Musculoskeletal:  No suspicious bone lesions identified. IMPRESSION: No acute findings within the abdomen or pelvis. Stable small epigastric and umbilical ventral hernias, both containing only fat. Electronically Signed   By: Danae Orleans M.D.   On: 01/29/2021 18:04    Procedures Procedures   Medications Ordered in ED Medications  pantoprozole (PROTONIX) 80 mg /NS 100 mL infusion (8 mg/hr Intravenous New Bag/Given 01/29/21 2027)  pantoprazole (PROTONIX) injection 40 mg (40 mg Intravenous Given 01/29/21 1554)  morphine 4 MG/ML injection 4 mg (4 mg Intravenous Given 01/29/21 1556)  ondansetron (ZOFRAN-ODT) disintegrating tablet 4 mg (4 mg Oral Given 01/29/21 1549)   sodium chloride 0.9 % bolus 1,000 mL (0 mLs Intravenous Stopped 01/29/21 1829)  fentaNYL (  SUBLIMAZE) injection 75 mcg (75 mcg Intravenous Given 01/29/21 1758)  iohexol (OMNIPAQUE) 350 MG/ML injection 100 mL (100 mLs Intravenous Contrast Given 01/29/21 1729)  HYDROmorphone (DILAUDID) injection 1 mg (1 mg Intravenous Given 01/29/21 2023)    ED Course  I have reviewed the triage vital signs and the nursing notes.  Pertinent labs & imaging results that were available during my care of the patient were reviewed by me and considered in my medical decision making (see chart for details).    MDM Rules/Calculators/A&P                           40 year old female past medical history of GERD presents for evaluation of abdominal pain, hematemesis.  Reports 3 times episodes of dark coffee-ground emesis today.  She reports history of bleeding ulcer that was found on EGD several years ago.  She was previously on PPI but states she is not on that anymore.  On initial arrival, she is afebrile, toxic appearing.  Vital signs are stable.  On exam, she has tenderness diffusely noted in the upper abdomen.  She also reports that since she has been vomiting, she has had some soreness in her chest.  She states she will occasionally get burning pain in the middle of her chest at home consistent and GERD.  We will plan to check labs, CT.  I-stat beta is negative. Lipase is normal.  CMP shows bicarb of 21, normal BUN and creatinine.  UA shows small hemoglobin.  Otherwise unremarkable.  CBC shows no leukocytosis.  Hemoglobin stable at 13.9.  CT on pelvis shows no acute findings in the abdomen pelvis.  Stable small epigastric and umbilical ventral hernias.  Reevaluation.  Patient is still having pain.  We will plan to give additional analgesics.  She has not any vomiting here in the ED.  We will plan to p.o. challenge.  Patient with significantly more pain after p.o. challenge.  She feels nauseous.  Given her history and  the fact that she has not been able to get her pain under control, we will plan for admission.  Dr. Orvan Falconer ( GI) has been secure chatted. Her team will plan to consult on patient tomorrow.   Discussed patient with Dr. Criselda Peaches (hospitalist) who accepts patient for admission.  Portions of this note were generated with Scientist, clinical (histocompatibility and immunogenetics). Dictation errors may occur despite best attempts at proofreading.   Final Clinical Impression(s) / ED Diagnoses Final diagnoses:  Hematemesis, presence of nausea not specified  Pain of upper abdomen    Rx / DC Orders ED Discharge Orders     None        Rosana Hoes 01/29/21 2049    Melene Plan, DO 01/29/21 2127

## 2021-01-30 ENCOUNTER — Encounter (HOSPITAL_COMMUNITY)
Admission: EM | Disposition: A | Discharge status: Home / Self Care | Payer: Self-pay | Source: Home / Self Care | Attending: Emergency Medicine

## 2021-01-30 ENCOUNTER — Encounter (HOSPITAL_COMMUNITY): Payer: Self-pay | Admitting: Internal Medicine

## 2021-01-30 ENCOUNTER — Observation Stay (HOSPITAL_COMMUNITY): Payer: Medicaid Other | Admitting: Certified Registered Nurse Anesthetist

## 2021-01-30 DIAGNOSIS — B9681 Helicobacter pylori [H. pylori] as the cause of diseases classified elsewhere: Secondary | ICD-10-CM | POA: Diagnosis not present

## 2021-01-30 DIAGNOSIS — K920 Hematemesis: Secondary | ICD-10-CM

## 2021-01-30 DIAGNOSIS — K2970 Gastritis, unspecified, without bleeding: Secondary | ICD-10-CM | POA: Diagnosis not present

## 2021-01-30 DIAGNOSIS — K922 Gastrointestinal hemorrhage, unspecified: Secondary | ICD-10-CM | POA: Diagnosis not present

## 2021-01-30 DIAGNOSIS — R1010 Upper abdominal pain, unspecified: Secondary | ICD-10-CM | POA: Diagnosis not present

## 2021-01-30 DIAGNOSIS — K253 Acute gastric ulcer without hemorrhage or perforation: Secondary | ICD-10-CM

## 2021-01-30 DIAGNOSIS — K2901 Acute gastritis with bleeding: Secondary | ICD-10-CM

## 2021-01-30 DIAGNOSIS — Z20822 Contact with and (suspected) exposure to covid-19: Secondary | ICD-10-CM | POA: Diagnosis not present

## 2021-01-30 HISTORY — PX: ESOPHAGOGASTRODUODENOSCOPY (EGD) WITH PROPOFOL: SHX5813

## 2021-01-30 HISTORY — PX: BIOPSY: SHX5522

## 2021-01-30 LAB — COMPREHENSIVE METABOLIC PANEL
ALT: 13 U/L (ref 0–44)
AST: 15 U/L (ref 15–41)
Albumin: 3.5 g/dL (ref 3.5–5.0)
Alkaline Phosphatase: 50 U/L (ref 38–126)
Anion gap: 7 (ref 5–15)
BUN: 8 mg/dL (ref 6–20)
CO2: 23 mmol/L (ref 22–32)
Calcium: 8.8 mg/dL — ABNORMAL LOW (ref 8.9–10.3)
Chloride: 109 mmol/L (ref 98–111)
Creatinine, Ser: 0.92 mg/dL (ref 0.44–1.00)
GFR, Estimated: >60 mL/min (ref >60)
Glucose, Bld: 91 mg/dL (ref 70–99)
Potassium: 3.5 mmol/L (ref 3.5–5.1)
Sodium: 139 mmol/L (ref 135–145)
Total Bilirubin: 0.4 mg/dL (ref 0.3–1.2)
Total Protein: 6.6 g/dL (ref 6.5–8.1)

## 2021-01-30 LAB — CBC
HCT: 37 % (ref 36.0–46.0)
Hemoglobin: 12.2 g/dL (ref 12.0–15.0)
MCH: 31 pg (ref 26.0–34.0)
MCHC: 33 g/dL (ref 30.0–36.0)
MCV: 94.1 fL (ref 80.0–100.0)
Platelets: 310 10*3/uL (ref 150–400)
RBC: 3.93 MIL/uL (ref 3.87–5.11)
RDW: 13.8 % (ref 11.5–15.5)
WBC: 7.9 10*3/uL (ref 4.0–10.5)
nRBC: 0 % (ref 0.0–0.2)

## 2021-01-30 LAB — HIV ANTIBODY (ROUTINE TESTING W REFLEX): HIV Screen 4th Generation wRfx: NONREACTIVE

## 2021-01-30 LAB — PROTIME-INR
INR: 1.1 (ref 0.8–1.2)
Prothrombin Time: 13.9 seconds (ref 11.4–15.2)

## 2021-01-30 LAB — GLUCOSE, CAPILLARY: Glucose-Capillary: 92 mg/dL (ref 70–99)

## 2021-01-30 SURGERY — ESOPHAGOGASTRODUODENOSCOPY (EGD) WITH PROPOFOL
Anesthesia: Monitor Anesthesia Care

## 2021-01-30 MED ORDER — PANTOPRAZOLE SODIUM 40 MG PO TBEC
40.0000 mg | DELAYED_RELEASE_TABLET | Freq: Two times a day (BID) | ORAL | Status: DC
  Administered 2021-01-30: 40 mg via ORAL
  Filled 2021-01-30: qty 1

## 2021-01-30 MED ORDER — PHENOL 1.4 % MT LIQD
1.0000 | OROMUCOSAL | Status: DC | PRN
  Filled 2021-01-30: qty 177

## 2021-01-30 MED ORDER — LACTATED RINGERS IV SOLN
INTRAVENOUS | Status: AC | PRN
  Administered 2021-01-30: 1000 mL via INTRAVENOUS

## 2021-01-30 MED ORDER — LIDOCAINE 2% (20 MG/ML) 5 ML SYRINGE
INTRAMUSCULAR | Status: DC | PRN
  Administered 2021-01-30: 60 mg via INTRAVENOUS

## 2021-01-30 MED ORDER — PROPOFOL 10 MG/ML IV BOLUS
INTRAVENOUS | Status: DC | PRN
  Administered 2021-01-30: 20 mg via INTRAVENOUS
  Administered 2021-01-30: 15 mg via INTRAVENOUS
  Administered 2021-01-30: 10 mg via INTRAVENOUS

## 2021-01-30 MED ORDER — POLYETHYLENE GLYCOL 3350 17 G PO PACK: 17.0000 g | PACK | Freq: Two times a day (BID) | ORAL | Status: DC

## 2021-01-30 MED ORDER — PANTOPRAZOLE SODIUM 40 MG PO TBEC: 40.0000 mg | DELAYED_RELEASE_TABLET | Freq: Two times a day (BID) | ORAL | 0 refills | Status: DC

## 2021-01-30 MED ORDER — PROPOFOL 500 MG/50ML IV EMUL
INTRAVENOUS | Status: DC | PRN
  Administered 2021-01-30: 125 ug/kg/min via INTRAVENOUS

## 2021-01-30 SURGICAL SUPPLY — 14 items

## 2021-01-30 NOTE — Consult Note (Addendum)
Consultation  Referring Provider:  Dr. Renford DillsAdhikari    Primary Care Physician:  Pcp, No Primary Gastroenterologist: Previous Dr. Arlyce DiceKaplan       Reason for Consultation: Coffee-ground emesis            HPI:   [name] is a 40 y.o. female with a past medical history significant of GERD/esophagitis, IBS, migraine, asthma and others listed below, who presented to the hospital on 01/29/2021 for coffee-ground emesis.    Today, the patient explains that on Sunday morning, 01/29/2021 around 8 AM she woke up and felt nauseous upon sitting up at the side of the bed she vomited into a towel and it was a lot of coffee-ground material, she then made her way to the bathroom and vomited another time on the way and then copious more amounts when she got to the toilet.  They came straight to the ER.  After that point her stomach started to hurt rated as a 10/10.  She tells me this reminds her of when she had esophagitis back in 2010, although at that point she was seeing melena.  Tells me she has not had a bowel movement since Thursday, 01/26/2021 which can be somewhat normal for her, has been trying an over-the-counter laxative from the dollar store but it has not been helping.  Tells me this is chronic for her.  Denies any lower abdominal discomfort.  Denies abnormal diet prior to episode of vomiting, recent NSAIDs or alcohol use.  Tells me she does not have chronic reflux symptoms and is not on any medicine for this.  Has only been using Tylenol 3 for her recent wrist surgery which she had not taken since 7/28.  The only way she can control her pain in the hospital here is with pain medicines when allowed, otherwise it is still a 10/10 and described as very sharp radiating across the upper part of her stomach.    Denies fever, chills, weight loss, change in bowel habits or symptoms that awaken her from sleep.  ED course: Normal H/H, CT abdomen and chest x-ray with no acute finding     GI history: 03/12/2009  EGD in the hospital with esophagitis with active bleeding in the distal esophagus status post successful cauterization, hiatal hernia and otherwise normal  Past Medical History:  Diagnosis Date   Anemia    Asthma    Erosive esophagitis    distal esophagus, with low grade bleed 03/2009   GERD (gastroesophageal reflux disease)    Headache(784.0)    History of migraine headaches    IBS (irritable bowel syndrome)    Infection    UTI    Past Surgical History:  Procedure Laterality Date   LUMBAR LAMINECTOMY/DECOMPRESSION MICRODISCECTOMY Right 01/05/2016   Procedure: Right Lumbar three-four Microdiskectomy;  Surgeon: Coletta MemosKyle Cabbell, MD;  Location: [facility];  Service: Neurosurgery;  Laterality: Right;  right   WRIST SURGERY Right     Family History  Problem Relation Age of Onset   Migraines Mother    Asthma Mother    Migraines Father    Heart disease Father    Asthma Daughter    Cancer Maternal Grandmother     Social History   Tobacco Use   Smoking status: Light Smoker   Smokeless tobacco: Never  Substance Use Topics   Alcohol use: No    Comment: occasional on holidays   Drug use: No    Prior to Admission medications   Medication Sig  Start Date End Date Taking? Authorizing Provider  Acetaminophen-Codeine 300-30 MG tablet Take 1 tablet by mouth every 6 (six) hours as needed for pain. 01/11/21  Yes [provider]  albuterol (VENTOLIN HFA) 108 (90 Base) MCG/ACT inhaler Inhale into the lungs every 6 (six) hours as needed for wheezing or shortness of breath.   Yes [provider]  medroxyPROGESTERone (DEPO-PROVERA) 150 MG/ML injection Inject 150 mg into the muscle every 3 (three) months. 12/06/20  Yes [provider]  cyclobenzaprine (FLEXERIL) 10 MG tablet Take 10 mg by mouth 3 (three) times daily as needed for muscle spasms.    [provider]  medroxyPROGESTERone Acetate 150 MG/ML SUSY Inject 150 mLs into the skin every 3 (three) months.  08/12/17   [provider]  omeprazole (PRILOSEC) 20 MG capsule Take 1 capsule (20 mg total) by mouth daily. Patient not taking: Reported on 12/23/2018 07/18/18 03/16/19  Rodriguez-Southworth, Nettie Elm, PA-C    Current Facility-Administered Medications  Medication Dose Route Frequency Provider Last Rate Last Admin   0.9 %  sodium chloride infusion   Intravenous Continuous Inez Catalina, MD 75 mL/hr at 01/30/21 0700 Infusion Verify at 01/30/21 0700   acetaminophen (TYLENOL) tablet 650 mg  650 mg Oral Q6H PRN Inez Catalina, MD       Or   acetaminophen (TYLENOL) suppository 650 mg  650 mg Rectal Q6H PRN Inez Catalina, MD       albuterol (VENTOLIN HFA) 108 (90 Base) MCG/ACT inhaler 1-2 puff  1-2 puff Inhalation Q6H PRN Inez Catalina, MD   2 puff at 01/30/21 0644   HYDROcodone-acetaminophen (NORCO/VICODIN) 5-325 MG per tablet 1 tablet  1 tablet Oral Q8H PRN Inez Catalina, MD   1 tablet at 01/30/21 0811   HYDROmorphone (DILAUDID) injection 1 mg  1 mg Intravenous Q4H PRN Debe Coder B, MD   1 mg at 01/30/21 0443   ondansetron (ZOFRAN) tablet 4 mg  4 mg Oral Q6H PRN Inez Catalina, MD       Or   ondansetron [facility]) injection 4 mg  4 mg Intravenous Q6H PRN Inez Catalina, MD       pantoprozole (PROTONIX) 80 mg /NS 100 mL infusion  8 mg/hr Intravenous Continuous Debe Coder B, MD 10 mL/hr at 01/30/21 0700 8 mg/hr at 01/30/21 0700   senna-docusate (Senokot-S) tablet 1 tablet  1 tablet Oral QHS PRN Inez Catalina, MD       sodium chloride flush (NS) 0.9 % injection 3 mL  3 mL Intravenous Q12H Debe Coder B, MD   3 mL at 01/30/21 0041    Allergies as of 01/29/2021 - Review Complete 01/29/2021  Allergen Reaction Noted   Ibuprofen Other (See Comments) 01/30/2011   Tramadol Hives 03/20/2019     Review of Systems:    Constitutional: No weight loss, fever or chills Skin: No rash  Cardiovascular: No chest pain Respiratory: No SOB  Gastrointestinal: See HPI and otherwise  negative Genitourinary: No dysuria  Neurological: No headache, dizziness or syncope Musculoskeletal: No new muscle or joint pain Hematologic: No bruising Psychiatric: No history of depression or anxiety    Physical Exam:  Vital signs in last 24 hours: Temp:  [97.8 F (36.6 C)-98.8 F (37.1 C)] 98.4 F (36.9 C) (08/01 0727) Pulse Rate:  [60-91] 62 (08/01 0727) Resp:  [13-20] 18 (08/01 0727) BP: (124-163)/(66-107) 144/91 (08/01 0727) SpO2:  [97 %-100 %] 100 % (08/01 0727) Weight:  [97.5 kg] 97.5 kg (07/31  2030) Last BM Date: 01/24/21 General:   Pleasant AA female appears to be in NAD, Well developed, Well nourished, alert and cooperative Head:  Normocephalic and atraumatic. Eyes:   PEERL, EOMI. No icterus. Conjunctiva pink. Ears:  Normal auditory acuity. Neck:  Supple Throat: Oral cavity and pharynx without inflammation, swelling or lesion. Teeth in good condition. Lungs: Respirations even and unlabored. Lungs clear to auscultation bilaterally.   No wheezes, crackles, or rhonchi.  Heart: Normal S1, S2. No MRG. Regular rate and rhythm. No peripheral edema, cyanosis or pallor.  Abdomen:  Soft, nondistended, moderate epigastric ttp with involuntary guarding, decreased BS all four quadrants, No appreciable masses or hepatomegaly. Rectal:  Not performed.  Msk:  Symmetrical without gross deformities. Peripheral pulses intact.  Extremities:  Without edema, no deformity or joint abnormality.  Neurologic:  Alert and  oriented x4;  grossly normal neurologically.   Skin:   Dry and intact without significant lesions or rashes. Psychiatric: Demonstrates good judgement and reason without abnormal affect or behaviors.   LAB RESULTS: Recent Labs    01/29/21 1223 01/30/21 0039  WBC 8.2 7.9  HGB 13.9 12.2  HCT 41.1 37.0  PLT 385 310   BMET Recent Labs    01/29/21 1223 01/30/21 0039  NA 139 139  K 4.0 3.5  CL 109 109  CO2 21* 23  GLUCOSE 102* 91  BUN 7 8  CREATININE 0.83 0.92   CALCIUM 9.6 8.8*   LFT Recent Labs    01/30/21 0039  PROT 6.6  ALBUMIN 3.5  AST 15  ALT 13  ALKPHOS 50  BILITOT 0.4   PT/INR Recent Labs    01/30/21 0039  LABPROT 13.9  INR 1.1    STUDIES: DG Chest 2 View  Result Date: 01/29/2021 CLINICAL DATA:  Chest pain, nausea and vomiting. EXAM: CHEST - 2 VIEW COMPARISON:  Chest radiograph 07/18/2018 FINDINGS: The heart size and mediastinal contours are within normal limits. The lungs are clear. No pneumothorax or pleural effusion. No acute finding in the visualized skeleton. IMPRESSION: No active cardiopulmonary disease. Electronically Signed   By: Emmaline Kluver M.D.   On: 01/29/2021 16:22   CT ABDOMEN PELVIS W CONTRAST  Result Date: 01/29/2021 CLINICAL DATA:  Abdominal pain. Hematemesis. EXAM: CT ABDOMEN AND PELVIS WITH CONTRAST TECHNIQUE: Multidetector CT imaging of the abdomen and pelvis was performed using the standard protocol following bolus administration of intravenous contrast. CONTRAST:  OMNIPAQUE IOHEXOL 350 MG/ML SOLN COMPARISON:  Noncontrast CT on 02/05/2012 FINDINGS: Lower Chest: No acute findings. Hepatobiliary: No hepatic masses identified. Gallbladder is unremarkable. No evidence of biliary ductal dilatation. Pancreas:  No mass or inflammatory changes. Spleen: Within normal limits in size and appearance. Adrenals/Urinary Tract: No masses identified. No evidence of ureteral calculi or hydronephrosis. Stomach/Bowel: No evidence of obstruction, inflammatory process or abnormal fluid collections. Normal appendix visualized. Vascular/Lymphatic: No pathologically enlarged lymph nodes. No acute vascular findings. Reproductive:  No mass or other significant abnormality. Other: Small midline epigastric and umbilical ventral hernias are again seen, both containing only fat. Musculoskeletal:  No suspicious bone lesions identified. IMPRESSION: No acute findings within the abdomen or pelvis. Stable small epigastric and umbilical  ventral hernias, both containing only fat. Electronically Signed   By: Danae Orleans M.D.   On: 01/29/2021 18:04      Impression / Plan:   Impression: 1.  Coffee-ground emesis: 3 episodes of high-volume hematemesis on 01/30/2019 2 AM, none since admission, hemoglobin did drop from 13.9--> 12.2 overnight (likely at least  part dilutional), continues with severe epigastric pain requiring morphine and Dilaudid, CT abdomen and chest x-ray negative; consider PUD+/- H. Pylori+/-esophagitis  2.  History of reflux esophagitis: In 2010, see HPI and EGD results 3.  Recent wrist surgery: On Tylenol No. 3  4.  Constipation: Chronic for the patient, currently using over-the-counter laxatives which are not helping, typically 1 bowel movement a week; consider relation to medicines versus IBS versus other 5.  Epigastric pain: 10/10, see above  Plan: 1.  Patient will be set up for an EGD today at 1130 with Dr. Rhea Belton given ongoing 10/10 epigastric pain requiring IV pain meds.  Did discuss risks, benefits, limitations and alternatives with the patient and she agrees to proceed. 2.  Okay to continue with Protonix drip for now, but likely this can be changed to 40 mg IV twice daily after time of procedure pending findings. 3.  Will start MiraLAX twice daily, this can be titrated when she goes home to a soft solid stool at least once every other day.  Likely she will need to see [place] in outpatient clinic for further management of constipation. 4.  Continue antiemetics and analgesics as well as fluid resuscitation. 5.  Dr. Rhea Belton will provide further recommendations after time of EGD today.  Thank you for your kind consultation.  Violet Baldy [facility]  01/30/2021, 8:50 AM

## 2021-01-30 NOTE — Anesthesia Preprocedure Evaluation (Signed)
Anesthesia Evaluation  Patient identified by MRN, date of birth, ID band Patient awake    Reviewed: Allergy & Precautions, H&P , NPO status , Patient's Chart, lab work & pertinent test results  Airway Mallampati: II   Neck ROM: full    Dental   Pulmonary asthma , Current Smoker,    breath sounds clear to auscultation       Cardiovascular negative cardio ROS   Rhythm:regular Rate:Normal     Neuro/Psych  Headaches,    GI/Hepatic PUD, GERD  ,Coffee ground emesis   Endo/Other  obese  Renal/GU      Musculoskeletal   Abdominal   Peds  Hematology  (+) Blood dyscrasia, anemia ,   Anesthesia Other Findings   Reproductive/Obstetrics                             Anesthesia Physical Anesthesia Plan  ASA: 2  Anesthesia Plan: MAC   Post-op Pain Management:    Induction: Intravenous  PONV Risk Score and Plan: 1 and Propofol infusion and Treatment may vary due to age or medical condition  Airway Management Planned: Nasal Cannula  Additional Equipment:   Intra-op Plan:   Post-operative Plan:   Informed Consent: I have reviewed the patients History and Physical, chart, labs and discussed the procedure including the risks, benefits and alternatives for the proposed anesthesia with the patient or authorized representative who has indicated his/her understanding and acceptance.     Dental advisory given  Plan Discussed with: Anesthesiologist, Surgeon and CRNA  Anesthesia Plan Comments:         Anesthesia Quick Evaluation

## 2021-01-30 NOTE — Op Note (Signed)
[facility] Patient Name: [name] Procedure Date : 01/30/2021 MRN: [id number] Attending MD: Beverley Fiedler , MD Date of Birth: 1981/06/07 CSN: [id number] Age: 40 Admit Type: Inpatient Procedure:                Upper GI endoscopy Indications:              Epigastric abdominal pain, Coffee-ground emesis Providers:                Carie Caddy. Rhea Belton, MD, Janae Sauce. Steele Berg, RN, Clae Houle,                            Little Ishikawa, CRNA Referring MD:             [facility] Medicines:                Monitored Anesthesia Care Complications:            No immediate complications. Estimated Blood Loss:     Estimated blood loss was minimal. Procedure:                Pre-Anesthesia Assessment:                           - Prior to the procedure, a History and Physical                            was performed, and patient medications and                            allergies were reviewed. The patient's tolerance of                            previous anesthesia was also reviewed. The risks                            and benefits of the procedure and the sedation                            options and risks were discussed with the patient.                            All questions were answered, and informed consent                            was obtained. Prior Anticoagulants: The patient has                            taken no previous anticoagulant or antiplatelet                            agents. ASA Grade Assessment: II - A patient with                            mild systemic disease. After reviewing the risks  and benefits, the patient was deemed in                            satisfactory condition to undergo the procedure.                           After obtaining informed consent, the endoscope was                            passed under direct vision. Throughout the                            procedure, the patient's blood pressure, pulse,  and                            oxygen saturations were monitored continuously. The                            [id number]-[id number] ([id number]) Olympus endoscope was introduced                            through the mouth, and advanced to the second part                            of duodenum. The upper GI endoscopy was                            accomplished without difficulty. The patient                            tolerated the procedure well. Scope In: Scope Out: Findings:      The examined esophagus was normal.      The gastroesophageal flap valve was visualized endoscopically and       classified as Hill Grade II (fold present, opens with respiration).      Scattered moderate inflammation characterized by erosions, erythema and       shallow ulcerations was found in the cardia and in the gastric fundus.       No stigmata of bleeding or active bleeding. This is the source of recent       coffee-ground emesis. Biopsies were taken with a cold forceps for       histology.      The gastric body and gastric antrum were normal. Biopsies were taken       with a cold forceps for histology and Helicobacter pylori testing.      The examined duodenum was normal. Impression:               - Normal esophagus.                           - Gastritis with erosion and shallow ulcerations.                            Explanation of recent coffee-ground emesis.  Biopsied.                           - Normal gastric body and antrum. Biopsied.                           - Normal examined duodenum. Moderate Sedation:      N/A Recommendation:           - Return patient to hospital ward for possible                            discharge same day.                           - Advance diet as tolerated.                           - Continue present medications.                           - BID BO PPI x 8 weeks.                           - No aspirin, ibuprofen, naproxen, or other                             non-steroidal anti-inflammatory drugs.                           - Await pathology results.                           - Screening colonoscopy recommended at age 5.                           - GI will sign off, call if questions. GI follow-up                            as an outpatient can be as needed. I will                            communicate biopsy results to patient when                            completed. Procedure Code(s):        --- Professional ---                           [phone], Esophagogastroduodenoscopy, flexible,                            transoral; with biopsy, single or multiple Diagnosis Code(s):        --- Professional ---                           K29.70, Gastritis, unspecified, without bleeding  R10.13, Epigastric pain                           K92.0, Hematemesis CPT copyright 2019 American Medical Association. All rights reserved. The codes documented in this report are preliminary and upon coder review may  be revised to meet current compliance requirements. Beverley Fiedler, MD 01/30/2021 12:12:21 PM This report has been signed electronically. Number of Addenda: 0

## 2021-01-30 NOTE — Plan of Care (Signed)

## 2021-01-30 NOTE — Discharge Summary (Signed)
Physician Discharge Summary  [name]:[id number] DOB: Dec 02, 1980 DOA: 01/29/2021  PCP: Oneita Hurt, No  Admit date: 01/29/2021 Discharge date: 01/30/2021  Admitted From: Home Disposition:  Home  Discharge Condition:Stable CODE STATUS:FULL Diet recommendation: Soft diet for next 1-2 days  Brief/Interim Summary:  HPI: [name] is a 40 y.o. female with medical history significant of GERD/esophagitis, IBS, migraine, asthma who presents for coffee ground emesis.  She reports waking up from sleep today and immediately vomiting up brownish fluid.  She then vomited twice more into the toilet bowl, brownish liquid.  She has a history of esophagitis from 2010 which required endoscopy and cauterization.  She is no longer on a PPI, but reports daily heartburn.  Further symptoms include epigastric and LUQ pain which has been modestly relieved with IV narcotics in the ED.  She is not taking any NSAIDs at this time per report.  She has been receiving norco for her wrist.  She is s/p surgery and wearing a pressure wrap at this time.  Denies chest pain other than heartburn, recent fever, chills, diarrhea, dysuria, lower abdominal pain, swelling, lymphadenopathy, recent travel, recent steroid use.  She has no bruising or abnormal pigment.  No petechiae.  She does note some chronic constipation.  Reports eating chili and a homemade philly cheese steak last evening, nothing outside of her normal.   ED Course: in the ED, she was noted to have a normal H/H.  CT abdomen and CXR did not show any acute findings.  She was treated for pain but still in significant pain per report.  GI was consulted and will plan to see her in the AM.  She was started on a protonix drip.  Hospital course:  Her hospital course remained stable.  GI was consulted after admission.  She underwent EGD today with findings of gastritis with erosions, shallow ulcers.  Her hemoglobin has remained stable.  GI recommended 8 weeks of PPI  twice daily.  She is medically stable for discharge to home today.  We recommend to avoid NSAIDs, aspirin.   Discharge Diagnoses:  Principal Problem:   Acute upper GI bleeding Active Problems:   Asthma, mild intermittent, well-controlled   Reflux esophagitis   Hematemesis   Acute gastric ulcer   Acute gastritis with hemorrhage    Discharge Instructions  Discharge Instructions     Diet general   Complete by: As directed    Take soft diet for next 1-2 days   Discharge instructions   Complete by: As directed    1)Please take prescribed medication as instructed 2)Follow up with a PCP in a week   Increase activity slowly   Complete by: As directed       Allergies as of 01/30/2021       Reactions   Ibuprofen Other (See Comments)   Ulcer flare up   Tramadol Hives        Medication List     STOP taking these medications    cyclobenzaprine 10 MG tablet Commonly known as: FLEXERIL       TAKE these medications    Acetaminophen-Codeine 300-30 MG tablet Take 1 tablet by mouth every 6 (six) hours as needed for pain.   albuterol 108 (90 Base) MCG/ACT inhaler Commonly known as: VENTOLIN HFA Inhale into the lungs every 6 (six) hours as needed for wheezing or shortness of breath.   medroxyPROGESTERone 150 MG/ML injection Commonly known as: DEPO-PROVERA Inject 150 mg into the muscle every 3 (three) months. What  changed: Another medication with the same name was removed. Continue taking this medication, and follow the directions you see here.   pantoprazole 40 MG tablet Commonly known as: Protonix Take 1 tablet (40 mg total) by mouth 2 (two) times daily.        Allergies  Allergen Reactions   Ibuprofen Other (See Comments)    Ulcer flare up    Tramadol Hives    Consultations: GI   Procedures/Studies: DG Chest 2 View  Result Date: 01/29/2021 CLINICAL DATA:  Chest pain, nausea and vomiting. EXAM: CHEST - 2 VIEW COMPARISON:  Chest radiograph 07/18/2018  FINDINGS: The heart size and mediastinal contours are within normal limits. The lungs are clear. No pneumothorax or pleural effusion. No acute finding in the visualized skeleton. IMPRESSION: No active cardiopulmonary disease. Electronically Signed   By: Emmaline KluverNancy  Ballantyne M.D.   On: 01/29/2021 16:22   CT ABDOMEN PELVIS W CONTRAST  Result Date: 01/29/2021 CLINICAL DATA:  Abdominal pain. Hematemesis. EXAM: CT ABDOMEN AND PELVIS WITH CONTRAST TECHNIQUE: Multidetector CT imaging of the abdomen and pelvis was performed using the standard protocol following bolus administration of intravenous contrast. CONTRAST:  100mL OMNIPAQUE IOHEXOL 350 MG/ML SOLN COMPARISON:  Noncontrast CT on 02/05/2012 FINDINGS: Lower Chest: No acute findings. Hepatobiliary: No hepatic masses identified. Gallbladder is unremarkable. No evidence of biliary ductal dilatation. Pancreas:  No mass or inflammatory changes. Spleen: Within normal limits in size and appearance. Adrenals/Urinary Tract: No masses identified. No evidence of ureteral calculi or hydronephrosis. Stomach/Bowel: No evidence of obstruction, inflammatory process or abnormal fluid collections. Normal appendix visualized. Vascular/Lymphatic: No pathologically enlarged lymph nodes. No acute vascular findings. Reproductive:  No mass or other significant abnormality. Other: Small midline epigastric and umbilical ventral hernias are again seen, both containing only fat. Musculoskeletal:  No suspicious bone lesions identified. IMPRESSION: No acute findings within the abdomen or pelvis. Stable small epigastric and umbilical ventral hernias, both containing only fat. Electronically Signed   By: Danae OrleansJohn A Stahl M.D.   On: 01/29/2021 18:04      Subjective: Patient seen and examined the bedside this morning.  Hemodynamically stable for discharge today.  Discharge Exam: Vitals:   01/30/21 1211 01/30/21 1221  BP: 122/85 (!) 155/93  Pulse: 85 63  Resp: 19 (!) 24  Temp: (!) 97.5 F  (36.4 C)   SpO2: 100% 100%   Vitals:   01/30/21 0727 01/30/21 1035 01/30/21 1211 01/30/21 1221  BP: (!) 144/91 (!) 169/112 122/85 (!) 155/93  Pulse: 62 64 85 63  Resp: 18 19 19  (!) 24  Temp: 98.4 F (36.9 C) 98.7 F (37.1 C) (!) 97.5 F (36.4 C)   TempSrc: Oral Oral Temporal   SpO2: 100% 100% 100% 100%  Weight:  97.5 kg    Height:  5\' 5"  (1.651 m)      General: Pt is alert, awake, not in acute distress Cardiovascular: RRR, S1/S2 +, no rubs, no gallops Respiratory: CTA bilaterally, no wheezing, no rhonchi Abdominal: Soft, NT, ND, bowel sounds + Extremities: no edema, no cyanosis    The results of significant diagnostics from this hospitalization (including imaging, microbiology, ancillary and laboratory) are listed below for reference.     Microbiology: Recent Results (from the past 240 hour(s))  Resp Panel by RT-PCR (Flu A&B, Covid) Nasopharyngeal Swab     Status: None   Collection Time: 01/29/21  8:32 PM   Specimen: Nasopharyngeal Swab; Nasopharyngeal(NP) swabs in vial transport medium  Result Value Ref Range Status   SARS Coronavirus 2  by RT PCR NEGATIVE NEGATIVE Final    Comment: (NOTE) SARS-CoV-2 target nucleic acids are NOT DETECTED.  The SARS-CoV-2 RNA is generally detectable in upper respiratory specimens during the acute phase of infection. The lowest concentration of SARS-CoV-2 viral copies this assay can detect is 138 copies/mL. A negative result does not preclude SARS-Cov-2 infection and should not be used as the sole basis for treatment or other patient management decisions. A negative result may occur with  improper specimen collection/handling, submission of specimen other than nasopharyngeal swab, presence of viral mutation(s) within the areas targeted by this assay, and inadequate number of viral copies(<138 copies/mL). A negative result must be combined with clinical observations, patient history, and epidemiological information. The expected result  is Negative.  Fact Sheet for Patients:  BloggerCourse.com  Fact Sheet for Healthcare Providers:  SeriousBroker.it  This test is no t yet approved or cleared by the [place] FDA and  has been authorized for detection and/or diagnosis of SARS-CoV-2 by FDA under an Emergency Use Authorization (EUA). This EUA will remain  in effect (meaning this test can be used) for the duration of the COVID-19 declaration under Section 564(b)(1) of the Act, 21 U.S.C.section 360bbb-3(b)(1), unless the authorization is terminated  or revoked sooner.       Influenza A by PCR NEGATIVE NEGATIVE Final   Influenza B by PCR NEGATIVE NEGATIVE Final    Comment: (NOTE) The Xpert Xpress SARS-CoV-2/FLU/RSV plus assay is intended as an aid in the diagnosis of influenza from Nasopharyngeal swab specimens and should not be used as a sole basis for treatment. Nasal washings and aspirates are unacceptable for Xpert Xpress SARS-CoV-2/FLU/RSV testing.  Fact Sheet for Patients: BloggerCourse.com  Fact Sheet for Healthcare Providers: SeriousBroker.it  This test is not yet approved or cleared by the [place] FDA and has been authorized for detection and/or diagnosis of SARS-CoV-2 by FDA under an Emergency Use Authorization (EUA). This EUA will remain in effect (meaning this test can be used) for the duration of the COVID-19 declaration under Section 564(b)(1) of the Act, 21 U.S.C. section 360bbb-3(b)(1), unless the authorization is terminated or revoked.  Performed at [facility], [address], [place], [place] [address]      Labs: BNP (last 3 results) No results for input(s): BNP in the last 8760 hours. Basic Metabolic Panel: Recent Labs  Lab 01/29/21 1223 01/30/21 0039  NA 139 139  K 4.0 3.5  CL 109 109  CO2 21* 23  GLUCOSE 102* 91  BUN 7 8  CREATININE 0.83 0.92  CALCIUM 9.6 8.8*    Liver Function Tests: Recent Labs  Lab 01/29/21 1223 01/30/21 0039  AST 16 15  ALT 16 13  ALKPHOS 53 50  BILITOT 0.1* 0.4  PROT 7.4 6.6  ALBUMIN 4.0 3.5   Recent Labs  Lab 01/29/21 1223  LIPASE 24   No results for input(s): AMMONIA in the last 168 hours. CBC: Recent Labs  Lab 01/29/21 1223 01/30/21 0039  WBC 8.2 7.9  NEUTROABS 4.7  --   HGB 13.9 12.2  HCT 41.1 37.0  MCV 93.2 94.1  PLT 385 310   Cardiac Enzymes: No results for input(s): CKTOTAL, CKMB, CKMBINDEX, TROPONINI in the last 168 hours. BNP: Invalid input(s): POCBNP CBG: Recent Labs  Lab 01/30/21 0727  GLUCAP 92   D-Dimer No results for input(s): DDIMER in the last 72 hours. Hgb A1c No results for input(s): HGBA1C in the last 72 hours. Lipid Profile No results for input(s):  CHOL, HDL, LDLCALC, TRIG, CHOLHDL, LDLDIRECT in the last 72 hours. Thyroid function studies No results for input(s): TSH, T4TOTAL, T3FREE, THYROIDAB in the last 72 hours.  Invalid input(s): FREET3 Anemia work up No results for input(s): VITAMINB12, FOLATE, FERRITIN, TIBC, IRON, RETICCTPCT in the last 72 hours. Urinalysis    Component Value Date/Time   COLORURINE YELLOW 01/29/2021 1223   APPEARANCEUR HAZY (A) 01/29/2021 1223   LABSPEC 1.021 01/29/2021 1223   PHURINE 6.0 01/29/2021 1223   GLUCOSEU NEGATIVE 01/29/2021 1223   HGBUR SMALL (A) 01/29/2021 1223   HGBUR negative 06/14/2009 1430   BILIRUBINUR NEGATIVE 01/29/2021 1223   KETONESUR NEGATIVE 01/29/2021 1223   PROTEINUR NEGATIVE 01/29/2021 1223   UROBILINOGEN 0.2 04/02/2015 0425   NITRITE NEGATIVE 01/29/2021 1223   LEUKOCYTESUR NEGATIVE 01/29/2021 1223   Sepsis Labs Invalid input(s): PROCALCITONIN,  WBC,  LACTICIDVEN Microbiology Recent Results (from the past 240 hour(s))  Resp Panel by RT-PCR (Flu A&B, Covid) Nasopharyngeal Swab     Status: None   Collection Time: 01/29/21  8:32 PM   Specimen: Nasopharyngeal Swab; Nasopharyngeal(NP) swabs in vial transport  medium  Result Value Ref Range Status   SARS Coronavirus 2 by RT PCR NEGATIVE NEGATIVE Final    Comment: (NOTE) SARS-CoV-2 target nucleic acids are NOT DETECTED.  The SARS-CoV-2 RNA is generally detectable in upper respiratory specimens during the acute phase of infection. The lowest concentration of SARS-CoV-2 viral copies this assay can detect is 138 copies/mL. A negative result does not preclude SARS-Cov-2 infection and should not be used as the sole basis for treatment or other patient management decisions. A negative result may occur with  improper specimen collection/handling, submission of specimen other than nasopharyngeal swab, presence of viral mutation(s) within the areas targeted by this assay, and inadequate number of viral copies(<138 copies/mL). A negative result must be combined with clinical observations, patient history, and epidemiological information. The expected result is Negative.  Fact Sheet for Patients:  BloggerCourse.com  Fact Sheet for Healthcare Providers:  SeriousBroker.it  This test is no t yet approved or cleared by the [place] FDA and  has been authorized for detection and/or diagnosis of SARS-CoV-2 by FDA under an Emergency Use Authorization (EUA). This EUA will remain  in effect (meaning this test can be used) for the duration of the COVID-19 declaration under Section 564(b)(1) of the Act, 21 U.S.C.section 360bbb-3(b)(1), unless the authorization is terminated  or revoked sooner.       Influenza A by PCR NEGATIVE NEGATIVE Final   Influenza B by PCR NEGATIVE NEGATIVE Final    Comment: (NOTE) The Xpert Xpress SARS-CoV-2/FLU/RSV plus assay is intended as an aid in the diagnosis of influenza from Nasopharyngeal swab specimens and should not be used as a sole basis for treatment. Nasal washings and aspirates are unacceptable for Xpert Xpress SARS-CoV-2/FLU/RSV testing.  Fact Sheet for  Patients: BloggerCourse.com  Fact Sheet for Healthcare Providers: SeriousBroker.it  This test is not yet approved or cleared by the [place] FDA and has been authorized for detection and/or diagnosis of SARS-CoV-2 by FDA under an Emergency Use Authorization (EUA). This EUA will remain in effect (meaning this test can be used) for the duration of the COVID-19 declaration under Section 564(b)(1) of the Act, 21 U.S.C. section 360bbb-3(b)(1), unless the authorization is terminated or revoked.  Performed at [facility], [address], [place], [place] [address]     Please note: You were cared for by a hospitalist during your hospital stay. Once you  are discharged, your primary care physician will handle any further medical issues. Please note that NO REFILLS for any discharge medications will be authorized once you are discharged, as it is imperative that you return to your primary care physician (or establish a relationship with a primary care physician if you do not have one) for your post hospital discharge needs so that they can reassess your need for medications and monitor your lab values.    Time coordinating discharge: 40 minutes  SIGNED:   Burnadette Pop, MD  [facility] 01/30/2021, 12:44 PM Pager [phone]  If 7PM-7AM, please contact night-coverage [email or web address] Password [email or web address]

## 2021-01-30 NOTE — Anesthesia Procedure Notes (Signed)
Procedure Name: MAC Date/Time: 01/30/2021 11:44 AM Performed by: Mariea Clonts, CRNA Pre-anesthesia Checklist: Patient identified, Emergency Drugs available, Suction available, Patient being monitored and Timeout performed Patient Re-evaluated:Patient Re-evaluated prior to induction Oxygen Delivery Method: Simple face mask

## 2021-01-30 NOTE — Transfer of Care (Signed)
Immediate Anesthesia Transfer of Care Note  Patient: [name]  Procedure(s) Performed: ESOPHAGOGASTRODUODENOSCOPY (EGD) WITH PROPOFOL  Patient Location: PACU  Anesthesia Type:MAC  Level of Consciousness: awake and alert   Airway & Oxygen Therapy: Patient Spontanous Breathing and Patient connected to nasal cannula oxygen  Post-op Assessment: Report given to RN and Post -op Vital signs reviewed and stable  Post vital signs: Reviewed and stable  Last Vitals:  Vitals Value Taken Time  BP    Temp    Pulse    Resp    SpO2      Last Pain:  Vitals:   01/30/21 1035  TempSrc: Oral  PainSc: 7          Complications: No notable events documented.

## 2021-01-31 ENCOUNTER — Encounter (HOSPITAL_COMMUNITY): Payer: Self-pay | Admitting: Internal Medicine

## 2021-01-31 LAB — SURGICAL PATHOLOGY

## 2021-01-31 NOTE — Anesthesia Postprocedure Evaluation (Signed)
Anesthesia Post Note  Patient: [name]  Procedure(s) Performed: ESOPHAGOGASTRODUODENOSCOPY (EGD) WITH PROPOFOL BIOPSY     Patient location during evaluation: Endoscopy Anesthesia Type: MAC Level of consciousness: awake and alert Pain management: pain level controlled Vital Signs Assessment: post-procedure vital signs reviewed and stable Respiratory status: spontaneous breathing, nonlabored ventilation, respiratory function stable and patient connected to nasal cannula oxygen Cardiovascular status: stable and blood pressure returned to baseline Postop Assessment: no apparent nausea or vomiting Anesthetic complications: no   No notable events documented.  Last Vitals:  Vitals:   01/30/21 1347 01/30/21 1606  BP: (!) 147/90 (!) 147/96  Pulse: 69 79  Resp: 18 18  Temp: 36.7 C 37.1 C  SpO2: 100% 100%    Last Pain:  Vitals:   01/30/21 1633  TempSrc:   PainSc: [place]

## 2021-02-02 ENCOUNTER — Encounter (HOSPITAL_BASED_OUTPATIENT_CLINIC_OR_DEPARTMENT_OTHER): Payer: Self-pay | Admitting: *Deleted

## 2021-02-02 ENCOUNTER — Other Ambulatory Visit: Payer: Self-pay

## 2021-02-02 ENCOUNTER — Inpatient Hospital Stay (HOSPITAL_BASED_OUTPATIENT_CLINIC_OR_DEPARTMENT_OTHER)
Admission: EM | Admit: 2021-02-02 | Discharge: 2021-02-05 | DRG: 601 | Disposition: A | Discharge status: Home / Self Care | Payer: Medicaid Other | Attending: Internal Medicine | Admitting: Internal Medicine

## 2021-02-02 ENCOUNTER — Emergency Department (HOSPITAL_BASED_OUTPATIENT_CLINIC_OR_DEPARTMENT_OTHER): Payer: Medicaid Other | Admitting: Radiology

## 2021-02-02 ENCOUNTER — Emergency Department (HOSPITAL_BASED_OUTPATIENT_CLINIC_OR_DEPARTMENT_OTHER): Payer: Medicaid Other

## 2021-02-02 DIAGNOSIS — F17200 Nicotine dependence, unspecified, uncomplicated: Secondary | ICD-10-CM | POA: Diagnosis not present

## 2021-02-02 DIAGNOSIS — Z79899 Other long term (current) drug therapy: Secondary | ICD-10-CM | POA: Diagnosis not present

## 2021-02-02 DIAGNOSIS — N61 Mastitis without abscess: Secondary | ICD-10-CM

## 2021-02-02 DIAGNOSIS — J4520 Mild intermittent asthma, uncomplicated: Secondary | ICD-10-CM | POA: Diagnosis not present

## 2021-02-02 DIAGNOSIS — G43909 Migraine, unspecified, not intractable, without status migrainosus: Secondary | ICD-10-CM | POA: Diagnosis present

## 2021-02-02 DIAGNOSIS — K2970 Gastritis, unspecified, without bleeding: Secondary | ICD-10-CM | POA: Diagnosis present

## 2021-02-02 DIAGNOSIS — Z8719 Personal history of other diseases of the digestive system: Secondary | ICD-10-CM | POA: Diagnosis not present

## 2021-02-02 DIAGNOSIS — Z825 Family history of asthma and other chronic lower respiratory diseases: Secondary | ICD-10-CM

## 2021-02-02 DIAGNOSIS — N644 Mastodynia: Secondary | ICD-10-CM | POA: Diagnosis present

## 2021-02-02 DIAGNOSIS — R234 Changes in skin texture: Secondary | ICD-10-CM | POA: Diagnosis not present

## 2021-02-02 DIAGNOSIS — N6451 Induration of breast: Secondary | ICD-10-CM | POA: Diagnosis present

## 2021-02-02 DIAGNOSIS — Z809 Family history of malignant neoplasm, unspecified: Secondary | ICD-10-CM

## 2021-02-02 DIAGNOSIS — K760 Fatty (change of) liver, not elsewhere classified: Secondary | ICD-10-CM | POA: Diagnosis present

## 2021-02-02 DIAGNOSIS — K589 Irritable bowel syndrome without diarrhea: Secondary | ICD-10-CM | POA: Diagnosis present

## 2021-02-02 DIAGNOSIS — K5900 Constipation, unspecified: Secondary | ICD-10-CM | POA: Diagnosis present

## 2021-02-02 DIAGNOSIS — N611 Abscess of the breast and nipple: Secondary | ICD-10-CM | POA: Diagnosis not present

## 2021-02-02 DIAGNOSIS — R590 Localized enlarged lymph nodes: Secondary | ICD-10-CM | POA: Diagnosis not present

## 2021-02-02 DIAGNOSIS — Z886 Allergy status to analgesic agent status: Secondary | ICD-10-CM

## 2021-02-02 DIAGNOSIS — K2100 Gastro-esophageal reflux disease with esophagitis, without bleeding: Secondary | ICD-10-CM | POA: Diagnosis not present

## 2021-02-02 DIAGNOSIS — Z8249 Family history of ischemic heart disease and other diseases of the circulatory system: Secondary | ICD-10-CM

## 2021-02-02 DIAGNOSIS — K219 Gastro-esophageal reflux disease without esophagitis: Secondary | ICD-10-CM | POA: Diagnosis present

## 2021-02-02 DIAGNOSIS — Z20822 Contact with and (suspected) exposure to covid-19: Secondary | ICD-10-CM | POA: Diagnosis present

## 2021-02-02 DIAGNOSIS — Z79890 Hormone replacement therapy (postmenopausal): Secondary | ICD-10-CM

## 2021-02-02 LAB — COMPREHENSIVE METABOLIC PANEL
ALT: 14 U/L (ref 0–44)
AST: 13 U/L — ABNORMAL LOW (ref 15–41)
Albumin: 4.2 g/dL (ref 3.5–5.0)
Alkaline Phosphatase: 54 U/L (ref 38–126)
Anion gap: 11 (ref 5–15)
BUN: 6 mg/dL (ref 6–20)
CO2: 22 mmol/L (ref 22–32)
Calcium: 9.5 mg/dL (ref 8.9–10.3)
Chloride: 104 mmol/L (ref 98–111)
Creatinine, Ser: 0.76 mg/dL (ref 0.44–1.00)
GFR, Estimated: >60 mL/min (ref >60)
Glucose, Bld: 99 mg/dL (ref 70–99)
Potassium: 3.6 mmol/L (ref 3.5–5.1)
Sodium: 137 mmol/L (ref 135–145)
Total Bilirubin: 0.7 mg/dL (ref 0.3–1.2)
Total Protein: 7.4 g/dL (ref 6.5–8.1)

## 2021-02-02 LAB — RESP PANEL BY RT-PCR (FLU A&B, COVID) ARPGX2
Influenza A by PCR: NEGATIVE
Influenza B by PCR: NEGATIVE
SARS Coronavirus 2 by RT PCR: NEGATIVE

## 2021-02-02 LAB — CBC WITH DIFFERENTIAL/PLATELET
Abs Immature Granulocytes: 0.04 10*3/uL (ref 0.00–0.07)
Basophils Absolute: 0 10*3/uL (ref 0.0–0.1)
Basophils Relative: 0 %
Eosinophils Absolute: 0.1 10*3/uL (ref 0.0–0.5)
Eosinophils Relative: 0 %
HCT: 37.8 % (ref 36.0–46.0)
Hemoglobin: 13 g/dL (ref 12.0–15.0)
Immature Granulocytes: 0 %
Lymphocytes Relative: 14 %
Lymphs Abs: 1.8 10*3/uL (ref 0.7–4.0)
MCH: 31.6 pg (ref 26.0–34.0)
MCHC: 34.4 g/dL (ref 30.0–36.0)
MCV: 92 fL (ref 80.0–100.0)
Monocytes Absolute: 1 10*3/uL (ref 0.1–1.0)
Monocytes Relative: 7 %
Neutro Abs: 10.5 10*3/uL — ABNORMAL HIGH (ref 1.7–7.7)
Neutrophils Relative %: 79 %
Platelets: 304 10*3/uL (ref 150–400)
RBC: 4.11 MIL/uL (ref 3.87–5.11)
RDW: 13.8 % (ref 11.5–15.5)
WBC: 13.4 10*3/uL — ABNORMAL HIGH (ref 4.0–10.5)
nRBC: 0 % (ref 0.0–0.2)

## 2021-02-02 LAB — LACTIC ACID, PLASMA: Lactic Acid, Venous: 0.7 mmol/L (ref 0.5–1.9)

## 2021-02-02 LAB — PREGNANCY, URINE: Preg Test, Ur: NEGATIVE

## 2021-02-02 MED ORDER — IOHEXOL 300 MG/ML  SOLN
75.0000 mL | Freq: Once | INTRAMUSCULAR | Status: AC | PRN
  Administered 2021-02-02: 75 mL via INTRAVENOUS

## 2021-02-02 MED ORDER — DIPHENHYDRAMINE HCL 50 MG/ML IJ SOLN
25.0000 mg | Freq: Once | INTRAMUSCULAR | Status: AC
  Administered 2021-02-02: 25 mg via INTRAVENOUS
  Filled 2021-02-02: qty 1

## 2021-02-02 MED ORDER — ACETAMINOPHEN 325 MG PO TABS
650.0000 mg | ORAL_TABLET | Freq: Four times a day (QID) | ORAL | Status: DC | PRN
  Administered 2021-02-02 – 2021-02-03 (×2): 650 mg via ORAL
  Filled 2021-02-02 (×2): qty 2

## 2021-02-02 MED ORDER — MORPHINE SULFATE (PF) 2 MG/ML IV SOLN
2.0000 mg | INTRAVENOUS | Status: DC | PRN
Start: 2021-02-02 — End: 2021-02-03
  Administered 2021-02-02 – 2021-02-03 (×3): 2 mg via INTRAVENOUS
  Filled 2021-02-02 (×3): qty 1

## 2021-02-02 MED ORDER — ONDANSETRON HCL 4 MG PO TABS: 4.0000 mg | ORAL_TABLET | Freq: Four times a day (QID) | ORAL | Status: DC | PRN

## 2021-02-02 MED ORDER — ONDANSETRON HCL 4 MG/2ML IJ SOLN: 4.0000 mg | Freq: Four times a day (QID) | INTRAMUSCULAR | Status: DC | PRN

## 2021-02-02 MED ORDER — SENNOSIDES-DOCUSATE SODIUM 8.6-50 MG PO TABS
1.0000 | ORAL_TABLET | Freq: Every evening | ORAL | Status: DC | PRN
  Administered 2021-02-02: 1 via ORAL
  Filled 2021-02-02: qty 1

## 2021-02-02 MED ORDER — VANCOMYCIN HCL IN DEXTROSE 1-5 GM/200ML-% IV SOLN
1000.0000 mg | Freq: Once | INTRAVENOUS | Status: AC
  Administered 2021-02-02: 1000 mg via INTRAVENOUS
  Filled 2021-02-02: qty 200

## 2021-02-02 MED ORDER — ENOXAPARIN SODIUM 40 MG/0.4ML IJ SOSY
40.0000 mg | PREFILLED_SYRINGE | INTRAMUSCULAR | Status: DC
  Filled 2021-02-02: qty 0.4

## 2021-02-02 MED ORDER — ALBUTEROL SULFATE (2.5 MG/3ML) 0.083% IN NEBU: 3.0000 mL | INHALATION_SOLUTION | Freq: Four times a day (QID) | RESPIRATORY_TRACT | Status: DC | PRN

## 2021-02-02 MED ORDER — BISACODYL 5 MG PO TBEC
5.0000 mg | DELAYED_RELEASE_TABLET | Freq: Every day | ORAL | Status: DC | PRN
  Administered 2021-02-02: 5 mg via ORAL
  Filled 2021-02-02: qty 1

## 2021-02-02 MED ORDER — CEFAZOLIN SODIUM-DEXTROSE 1-4 GM/50ML-% IV SOLN
1.0000 g | Freq: Three times a day (TID) | INTRAVENOUS | Status: DC
  Administered 2021-02-02 – 2021-02-03 (×3): 1 g via INTRAVENOUS
  Filled 2021-02-02 (×3): qty 50

## 2021-02-02 MED ORDER — SODIUM CHLORIDE 0.9 % IV SOLN
2.0000 g | Freq: Once | INTRAVENOUS | Status: AC
  Administered 2021-02-02: 2 g via INTRAVENOUS
  Filled 2021-02-02: qty 2

## 2021-02-02 MED ORDER — PANTOPRAZOLE SODIUM 40 MG PO TBEC
40.0000 mg | DELAYED_RELEASE_TABLET | Freq: Two times a day (BID) | ORAL | Status: DC
  Administered 2021-02-02 – 2021-02-05 (×6): 40 mg via ORAL
  Filled 2021-02-02 (×6): qty 1

## 2021-02-02 MED ORDER — ACETAMINOPHEN 650 MG RE SUPP: 650.0000 mg | Freq: Four times a day (QID) | RECTAL | Status: DC | PRN

## 2021-02-02 MED ORDER — HYDROMORPHONE HCL 1 MG/ML IJ SOLN
1.0000 mg | Freq: Once | INTRAMUSCULAR | Status: AC
  Administered 2021-02-02: 1 mg via INTRAVENOUS
  Filled 2021-02-02: qty 1

## 2021-02-02 MED ORDER — MORPHINE SULFATE (PF) 2 MG/ML IV SOLN
2.0000 mg | Freq: Once | INTRAVENOUS | Status: AC
  Administered 2021-02-02: 2 mg via INTRAVENOUS
  Filled 2021-02-02: qty 1

## 2021-02-02 MED ORDER — LACTATED RINGERS IV BOLUS
1000.0000 mL | Freq: Once | INTRAVENOUS | Status: AC
  Administered 2021-02-02: 1000 mL via INTRAVENOUS

## 2021-02-02 MED ORDER — SODIUM CHLORIDE 0.9 % IV BOLUS
1000.0000 mL | Freq: Once | INTRAVENOUS | Status: AC
  Administered 2021-02-02: 1000 mL via INTRAVENOUS

## 2021-02-02 NOTE — ED Provider Notes (Signed)
  Physical Exam  BP (!) 145/83   Pulse (!) 114   Temp 98.5 F (36.9 C)   Resp (!) 26   Ht 1.651 m (5\' 5" )   Wt 97.5 kg   SpO2 100%   BMI 35.77 kg/m   Physical Exam  ED Course/Procedures     Procedures  MDM  40 yo female with recent admission for GERD/esophagitis presents today with left breast pain.  Seen and evaluated by Dr. 45. Firm swollen tender area below left areola with tenderness and swelling. CT breast pending. Mild leukocytosis  40 year old female with recent admission for GERD/esophagitis with coffee-ground emesis.  Today she presents with increasing left breast induration and pain.  Here in the ED, she is tachycardic, has a leukocytosis of 13,400 CT chest showing thickening of the last breast with marked stranding throughout the left breast in the subcutaneous fat suggestive of cellulitis and mastitis.  Inflammatory breast neoplasm is not excluded.  Patient's physical exam reveals diffusely tender, warm, swollen breast. She does have left axillary lymph nodes noted on CT and exam. Patient is receiving additional pain medications and IV antibiotics. Additional pain medicines and IV fluids infusing Blood cultures and lactic acid added Additionally patient is complaining of some rectal pain and constipation.  States she has not had a bowel movement since her hospitalization.  Dr. 45 evaluated for this and did a rectal exam which revealed no fecal impaction, stool or gross blood. Plan consult to hospitalist for consultation regarding ongoing evaluation and management inpatient setting. 5:01 PM Discussed with Dr. Criss Alvine, on-call for hospitalist.  He we discussed IV antibiotics choices.  He will place bed order for either [facility] or [facility] Patient's heart rate has decreased from 1 10-90 after fluids infusing and pain medication.  Pain is improved from 10 out of 10 to 7 out of 10 with 2 mg of morphine.      Redge Gainer, MD 02/03/21 [phone]

## 2021-02-02 NOTE — ED Notes (Signed)
ED Provider at bedside. 

## 2021-02-02 NOTE — H&P (Signed)
History and Physical    [name] GasserFealethea D Riffel ZOX:[id number] DOB: [birth year] DOA: 02/02/2021  PCP: Pcp, No  Patient coming from: [facility]  I have personally briefly reviewed patient's old medical records [facility]  Chief Complaint: Left breast pain  HPI: [name] is a 40 y.o. female with medical history significant for asthma, migraine, GERD/esophagitis, IBS who presented to the ED for evaluation of left breast pain.  Patient was recently hospitalized 01/29/2021-01/30/2021 for coffee-ground emesis.  She underwent EGD 01/30/2021 which showed gastritis with erosion and shallow ulcerations.  She was recommended to start 8 weeks of a PPI and avoid NSAIDs and was discharged home.  2 days ago she developed new onset of left breast pain which she first noticed when she was taking a shower.  Since then her breast has become more swollen and painful without relief.  She has had some erythema at the lower aspect of the breast.  She has not seen any open wound, discharge, or lactation.  She denies any injury/trauma.  She also complains of constipation with cold bowel movement in the last week.  She is passing gas.  She says she had a mammogram about 3 months ago which was reportedly normal.  She has had chills.  She has not had any nausea, vomiting, chest pain, dyspnea, dysuria.  [facility] ED Course:  Initial vitals showed BP 150/97, pulse 114, RR 20, temp 98.5 F, SPO2 100% on room air.  Labs show WBC 13.4, hemoglobin 13.0, platelets 304,000, sodium 137, potassium 3.6, bicarb 22, BUN 6, creatinine 0.76, serum glucose 99.  Urine pregnancy test negative.  Blood cultures obtained and pending.  Lactic acid 0.7.  SARS-CoV-2 PCR negative.  Influenza A/B PCR's are negative.  Abdominal x-ray showed moderate stool burden within the colon.  CT chest without contrast showed marked skin thickening at the left breast and findings suggestive of cellulitis/marked mastitis  without fluid collection.  Enlarged left axillary lymph nodes seen, potentially reactive.  Hepatic steatosis noted.  Patient was given 1 L LR, 1 L NS, IV morphine and Dilaudid.  She was given IV vancomycin and cefepime.  The hospitalist service was consulted to admit for further evaluation and management.  Review of Systems: All systems reviewed and are negative except as documented in history of present illness above.   Past Medical History:  Diagnosis Date   Anemia    Asthma    Erosive esophagitis    distal esophagus, with low grade bleed 03/2009   GERD (gastroesophageal reflux disease)    Headache(784.0)    History of migraine headaches    IBS (irritable bowel syndrome)    Infection    UTI    Past Surgical History:  Procedure Laterality Date   BIOPSY  01/30/2021   Procedure: BIOPSY;  Surgeon: Beverley FiedlerPyrtle, Jay M, MD;  [facility];  Service: Gastroenterology;;   ESOPHAGOGASTRODUODENOSCOPY (EGD) WITH PROPOFOL N/A 01/30/2021   Procedure: ESOPHAGOGASTRODUODENOSCOPY (EGD) WITH PROPOFOL;  Surgeon: Beverley FiedlerPyrtle, Jay M, MD;  [facility];  Service: Gastroenterology;  Laterality: N/A;   LUMBAR LAMINECTOMY/DECOMPRESSION MICRODISCECTOMY Right 01/05/2016   Procedure: Right Lumbar three-four Microdiskectomy;  Surgeon: Coletta MemosKyle Cabbell, MD;  Location: [facility] NEURO ORS;  Service: Neurosurgery;  Laterality: Right;  right   WRIST SURGERY Right     Social History:  reports that she has been smoking. She has never used smokeless tobacco. She reports that she does not drink alcohol and does not use drugs.  Allergies  Allergen Reactions  Ibuprofen Other (See Comments)    Ulcer flare up    Tramadol Hives    Family History  Problem Relation Age of Onset   Migraines Mother    Asthma Mother    Migraines Father    Heart disease Father    Asthma Daughter    Cancer Maternal Grandmother      Prior to Admission medications   Medication Sig Start Date End Date Taking? Authorizing Provider   pantoprazole (PROTONIX) 40 MG tablet Take 1 tablet (40 mg total) by mouth 2 (two) times daily. 01/30/21 03/31/21 Yes Burnadette Pop, MD  Acetaminophen-Codeine 300-30 MG tablet Take 1 tablet by mouth every 6 (six) hours as needed for pain. 01/11/21   [provider]  albuterol (VENTOLIN HFA) 108 (90 Base) MCG/ACT inhaler Inhale into the lungs every 6 (six) hours as needed for wheezing or shortness of breath.    [provider]  medroxyPROGESTERone (DEPO-PROVERA) 150 MG/ML injection Inject 150 mg into the muscle every 3 (three) months. 12/06/20   [provider]  omeprazole (PRILOSEC) 20 MG capsule Take 1 capsule (20 mg total) by mouth daily. Patient not taking: Reported on 12/23/2018 07/18/18 03/16/19  Rodriguez-Southworth, Nettie Elm, PA-C    Physical Exam: Vitals:   02/02/21 1742 02/02/21 1838 02/02/21 1854 02/02/21 1941  BP:  (!) 146/90  (!) 154/85  Pulse: 91   89  Resp: 20 19 20 18   Temp:    (!) 101.2 F (38.4 C)  TempSrc:    Oral  SpO2: 99%   100%  Weight:    102.2 kg  Height:       Constitutional: Resting in bed with head slightly elevated, appears tired but in NAD, calm, comfortable Eyes: PERRL, lids and conjunctivae normal ENMT: Mucous membranes are moist. Posterior pharynx clear of any exudate or lesions.Normal dentition.  Neck: normal, supple, no masses. Chest: Left breast is swollen at the inferior aspect with mild erythema over the lower breast.  No open wound or discharge.  Nipple appears grossly normal.  Breast is tender to light palpation. Respiratory: clear to auscultation bilaterally, no wheezing, no crackles. Normal respiratory effort. No accessory muscle use.  Cardiovascular: Regular rate and rhythm, no murmurs / rubs / gallops. No extremity edema. 2+ pedal pulses. Abdomen: no tenderness, no masses palpated. No hepatosplenomegaly. Bowel sounds positive.  Musculoskeletal: no clubbing / cyanosis. No joint deformity upper and lower extremities. Good ROM,  no contractures. Normal muscle tone.  Skin: Slight erythema left lower breast as above.  No open wound or ulceration. Neurologic: CN 2-12 grossly intact. Sensation intact. Strength 5/5 in all 4.  Psychiatric: Normal judgment and insight. Alert and oriented x 3. Normal mood.   Labs on Admission: I have personally reviewed following labs and imaging studies  CBC: Recent Labs  Lab 01/29/21 1223 01/30/21 0039 02/02/21 1352  WBC 8.2 7.9 13.4*  NEUTROABS 4.7  --  10.5*  HGB 13.9 12.2 13.0  HCT 41.1 37.0 37.8  MCV 93.2 94.1 92.0  PLT 385 310 304   Basic Metabolic Panel: Recent Labs  Lab 01/29/21 1223 01/30/21 0039 02/02/21 1352  NA 139 139 137  K 4.0 3.5 3.6  CL 109 109 104  CO2 21* 23 22  GLUCOSE 102* 91 99  BUN 7 8 6   CREATININE 0.83 0.92 0.76  CALCIUM 9.6 8.8* 9.5   GFR: Estimated Creatinine Clearance: 108.6 mL/min (by C-G formula based on SCr of 0.76 mg/dL). Liver Function Tests: Recent Labs  Lab 01/29/21 1223 01/30/21  5366 02/02/21 1352  AST 16 15 13*  ALT 16 13 14   ALKPHOS 53 50 54  BILITOT 0.1* 0.4 0.7  PROT 7.4 6.6 7.4  ALBUMIN 4.0 3.5 4.2   Recent Labs  Lab 01/29/21 1223  LIPASE 24   No results for input(s): AMMONIA in the last 168 hours. Coagulation Profile: Recent Labs  Lab 01/30/21 0039  INR 1.1   Cardiac Enzymes: No results for input(s): CKTOTAL, CKMB, CKMBINDEX, TROPONINI in the last 168 hours. BNP (last 3 results) No results for input(s): PROBNP in the last 8760 hours. HbA1C: No results for input(s): HGBA1C in the last 72 hours. CBG: Recent Labs  Lab 01/30/21 0727  GLUCAP 92   Lipid Profile: No results for input(s): CHOL, HDL, LDLCALC, TRIG, CHOLHDL, LDLDIRECT in the last 72 hours. Thyroid Function Tests: No results for input(s): TSH, T4TOTAL, FREET4, T3FREE, THYROIDAB in the last 72 hours. Anemia Panel: No results for input(s): VITAMINB12, FOLATE, FERRITIN, TIBC, IRON, RETICCTPCT in the last 72 hours. Urine analysis:     Component Value Date/Time   COLORURINE YELLOW 01/29/2021 1223   APPEARANCEUR HAZY (A) 01/29/2021 1223   LABSPEC 1.021 01/29/2021 1223   PHURINE 6.0 01/29/2021 1223   GLUCOSEU NEGATIVE 01/29/2021 1223   HGBUR SMALL (A) 01/29/2021 1223   HGBUR negative 06/14/2009 1430   BILIRUBINUR NEGATIVE 01/29/2021 1223   KETONESUR NEGATIVE 01/29/2021 1223   PROTEINUR NEGATIVE 01/29/2021 1223   UROBILINOGEN 0.2 04/02/2015 0425   NITRITE NEGATIVE 01/29/2021 1223   LEUKOCYTESUR NEGATIVE 01/29/2021 1223    Radiological Exams on Admission: CT CHEST W CONTRAST  Result Date: 02/02/2021 CLINICAL DATA:  Chest wall pain, LEFT breast swelling and pain, history of GI bleed. EXAM: CT CHEST WITH CONTRAST TECHNIQUE: Multidetector CT imaging of the chest was performed during intravenous contrast administration. CONTRAST:  44mL OMNIPAQUE IOHEXOL 300 MG/ML  SOLN COMPARISON:  Comparison made with abdominal imaging from January 29, 2021. FINDINGS: Cardiovascular: Heart size normal without pericardial effusion. Normal caliber of the thoracic aorta. Central pulmonary vasculature is normal caliber. Mediastinum/Nodes: Mildly enlarged LEFT axillary lymph nodes in the range of 9 mm to 1 cm short axis. Many of these retain fatty hila. No thoracic inlet adenopathy. No internal mammary adenopathy. No mediastinal or hilar lymphadenopathy. Esophagus grossly normal. Lungs/Pleura: Basilar atelectasis. No effusion. No consolidative changes. Airways are patent. Upper Abdomen: Incidental imaging of upper abdominal contents with hepatic steatosis. Imaged portions of upper abdominal viscera otherwise unremarkable on very limited assessment with limited coverage. Musculoskeletal: Marked skin thickening and stranding within the subcutaneous fat of the LEFT breast. Asymmetry in general of LEFT breast with increased density in the area of glandular tissue throughout the LEFT breast compared to the RIGHT breast. No focal fluid collection. The inferior  aspect of the LEFT breast could be seen on prior imaging from July and did not appear to show these changes. The breast is incompletely evaluated however on CT and was not imaged in its entirety on prior CT of the abdomen. IMPRESSION: 1. Marked skin thickening about the LEFT breast with marked stranding throughout the LEFT breast in the subcutaneous fat. Findings suggest cellulitis/marked mastitis without fluid collection. This would be difficult to differentiate from inflammatory breast neoplasm. Correlate with physical exam and breast imaging along with any signs of infection clinically. 2. Mildly enlarged LEFT axillary lymph nodes in the range of 9 mm to 1 cm, potentially reactive, though nonspecific in the current context. 3. Hepatic steatosis Electronically Signed   By: August.D.  On: 02/02/2021 16:00   DG ABD ACUTE 2+V W 1V CHEST  Result Date: 02/02/2021 CLINICAL DATA:  Constipation. EXAM: DG ABDOMEN ACUTE WITH 1 VIEW CHEST COMPARISON:  01/29/2021 FINDINGS: Heart size and mediastinal contours are unremarkable. No pleural effusion or edema. No airspace densities. No dilated small bowel loops. Moderate stool burden identified within the colon up to the level of the rectum. IMPRESSION: Moderate stool burden within the colon compatible with constipation. Electronically Signed   By: Signa Kell M.D.   On: 02/02/2021 14:53    EKG: Personally reviewed. Normal sinus rhythm without acute ischemic changes.  Assessment/Plan Principal Problem:   Cellulitis of left breast Active Problems:   Asthma, mild intermittent, well-controlled   [name] is a 40 y.o. female with medical history significant for asthma, migraine, GERD/esophagitis, IBS who is admitted with left breast cellulitis.  Acute left breast cellulitis/mastitis: CT imaging without evidence of abscess/fluid collection.  Patient reports normal-appearing mammogram 3 months ago. -Continue IV Ancef -Follow blood cultures -Cold  compresses -IV Dilaudid as needed for pain control, avoid NSAIDs given recent gastritis/upper GI bleed  GERD/gastritis: Recently admitted for coffee-ground emesis and found to have gastritis with erosions and cephaloulcers on EGD 8/1.  Currently stable.  Continue oral Protonix 40 mg twice daily.  Asthma: Stable.  Continue albuterol as needed.  DVT prophylaxis: SCDs only for now given recent upper GI bleed Code Status: Full code, confirmed with patient Family Communication: Discussed with patient's daughter at bedside Disposition Plan: From home and likely discharge to home pending clinical progress Consults called: None Level of care: Med-Surg Admission status:  Status is: Observation  The patient remains OBS appropriate and will d/c before 2 midnights.  Dispo: The patient is from: Home              Anticipated d/c is to: Home              Patient currently is not medically stable to d/c.   Difficult to place patient No  Darreld Mclean MD [facility]  If 7PM-7AM, please contact night-coverage [email or web address]  02/02/2021, 8:46 PM

## 2021-02-02 NOTE — ED Provider Notes (Signed)
[facility] EMERGENCY DEPT Provider Note   CSN: [id number] Arrival date & time: 02/02/21  1313     History Chief Complaint  Patient presents with   Breast Pain   Constipation    [name] is a 39 y.o. female.  HPI 40 year old female presents with left breast pain and constipation.  She was released from the hospital 3 days ago.  2 days ago when taking a shower her left breast felt abnormal and painful.  It has become more painful and swollen ever since.  No trauma.  During her admission she had an EGD for presumed upper GI bleeding.  She has been taking Protonix.  She also endorses no bowel movements for about 9 days.  Has taken a laxative with no relief.  She is passing gas.  No vomiting or abdominal pain.  Her chest does not hurt, solely her breast.  She has not noticed any skin color changes to the skin and no fevers.  Pain is rated as severe.  Past Medical History:  Diagnosis Date   Anemia    Asthma    Erosive esophagitis    distal esophagus, with low grade bleed 03/2009   GERD (gastroesophageal reflux disease)    Headache(784.0)    History of migraine headaches    IBS (irritable bowel syndrome)    Infection    UTI    Patient Active Problem List   Diagnosis Date Noted   Hematemesis    Acute gastric ulcer    Acute gastritis with hemorrhage    Acute upper GI bleeding 01/29/2021   Pain of upper abdomen    Subluxation of extensor carpi ulnaris tendon, right, subsequent encounter 03/17/2020   HNP (herniated nucleus pulposus), lumbar 01/05/2016   Depo contraception 02/17/2015   Chronic back pain greater than 3 months duration 12/14/2014   Normal labor 06/12/2014   Flu vaccine need 11/03/2013   Hyperemesis complicating pregnancy, antepartum 11/03/2013   Healthcare maintenance 01/13/2013   Seasonal allergies 01/13/2013   Mesenteric adenitis 02/05/2012   Pes planus (flat feet) 06/11/2011   Reflux esophagitis 03/28/2009   Asthma, mild intermittent,  well-controlled 03/11/2009    Past Surgical History:  Procedure Laterality Date   BIOPSY  01/30/2021   Procedure: BIOPSY;  Surgeon: Beverley Fiedler, MD;  Location: [facility] ENDOSCOPY;  Service: Gastroenterology;;   ESOPHAGOGASTRODUODENOSCOPY (EGD) WITH PROPOFOL N/A 01/30/2021   Procedure: ESOPHAGOGASTRODUODENOSCOPY (EGD) WITH PROPOFOL;  Surgeon: Beverley Fiedler, MD;  Location: [facility] ENDOSCOPY;  Service: Gastroenterology;  Laterality: N/A;   LUMBAR LAMINECTOMY/DECOMPRESSION MICRODISCECTOMY Right 01/05/2016   Procedure: Right Lumbar three-four Microdiskectomy;  Surgeon: Coletta Memos, MD;  Location: [facility] NEURO ORS;  Service: Neurosurgery;  Laterality: Right;  right   WRIST SURGERY Right      OB History     Gravida  3   Para  3   Term  3   Preterm      AB      Living  3      SAB      IAB      Ectopic      Multiple  0   Live Births  3           Family History  Problem Relation Age of Onset   Migraines Mother    Asthma Mother    Migraines Father    Heart disease Father    Asthma Daughter    Cancer Maternal Grandmother     Social History   Tobacco Use  Smoking status: Light Smoker   Smokeless tobacco: Never  Vaping Use   Vaping Use: Never used  Substance Use Topics   Alcohol use: No    Comment: occasional on holidays   Drug use: No    Home Medications Prior to Admission medications   Medication Sig Start Date End Date Taking? Authorizing Provider  pantoprazole (PROTONIX) 40 MG tablet Take 1 tablet (40 mg total) by mouth 2 (two) times daily. 01/30/21 03/31/21 Yes Burnadette Pop, MD  Acetaminophen-Codeine 300-30 MG tablet Take 1 tablet by mouth every 6 (six) hours as needed for pain. 01/11/21   [provider]  albuterol (VENTOLIN HFA) 108 (90 Base) MCG/ACT inhaler Inhale into the lungs every 6 (six) hours as needed for wheezing or shortness of breath.    [provider]  medroxyPROGESTERone (DEPO-PROVERA) 150 MG/ML injection Inject 150 mg into the muscle  every 3 (three) months. 12/06/20   [provider]  omeprazole (PRILOSEC) 20 MG capsule Take 1 capsule (20 mg total) by mouth daily. Patient not taking: Reported on 12/23/2018 07/18/18 03/16/19  Rodriguez-Southworth, Nettie Elm, PA-C    Allergies    Ibuprofen and Tramadol  Review of Systems   Review of Systems  Constitutional:  Negative for fever.  Respiratory:  Negative for shortness of breath.   Cardiovascular:  Negative for chest pain.  Gastrointestinal:  Positive for constipation. Negative for abdominal pain and vomiting.  Skin:  Negative for color change.  All other systems reviewed and are negative.  Physical Exam Updated Vital Signs BP (!) 150/97 (BP Location: Right Arm)   Pulse (!) 114   Temp 98.5 F (36.9 C)   Resp 20   Ht 5\' 5"  (1.651 m)   Wt 97.5 kg   SpO2 100%   BMI 35.77 kg/m   Physical Exam Vitals and nursing note reviewed. Exam conducted with a chaperone present.  Constitutional:      General: She is in acute distress (in pain).     Appearance: She is well-developed. She is not ill-appearing or diaphoretic.  HENT:     Head: Normocephalic and atraumatic.     Right Ear: External ear normal.     Left Ear: External ear normal.     Nose: Nose normal.  Eyes:     General:        Right eye: No discharge.        Left eye: No discharge.  Cardiovascular:     Rate and Rhythm: Regular rhythm. Tachycardia present.     Heart sounds: Normal heart sounds.  Pulmonary:     Effort: Pulmonary effort is normal.     Breath sounds: Normal breath sounds.  Chest:     Comments: No obvious masses/swelling to right breast. No tenderness Left breast has focal swelling/tenderness surrounding the areola. No redness. Nipple appears grossly normal. Abdominal:     Palpations: Abdomen is soft.     Tenderness: There is no abdominal tenderness.  Genitourinary:    Comments: No stool or fecal impaction on DRE. No gross blood Skin:    General: Skin is warm and dry.  Neurological:      Mental Status: She is alert.  Psychiatric:        Mood and Affect: Mood is not anxious.    ED Results / Procedures / Treatments   Labs (all labs ordered are listed, but only abnormal results are displayed) Labs Reviewed  COMPREHENSIVE METABOLIC PANEL  CBC WITH DIFFERENTIAL/PLATELET  PREGNANCY, URINE    EKG None  Radiology No results found.  Procedures Procedures   Medications Ordered in ED Medications  sodium chloride 0.9 % bolus 1,000 mL (has no administration in time range)  HYDROmorphone (DILAUDID) injection 1 mg (has no administration in time range)    ED Course  I have reviewed the triage vital signs and the nursing notes.  Pertinent labs & imaging results that were available during my care of the patient were reviewed by me and considered in my medical decision making (see chart for details).    MDM Rules/Calculators/A&P                           From a constipation standpoint, x-ray shows stool but no obvious obstruction and there was no fecal impaction.  Her heart rate has significantly improved since treatment of pain.  As for the breast pain, question whether this is cellulitis versus abscess and will get CT.  Care to Dr. Rosalia Hammers. Final Clinical Impression(s) / ED Diagnoses Final diagnoses:  None    Rx / DC Orders ED Discharge Orders     None        Pricilla Loveless, MD 02/02/21 1552

## 2021-02-02 NOTE — Plan of Care (Signed)
40 year old female who was discharged recently on 01/30/2021 after hospitalization for GI bleed and underwent EGD and was found to have gastritis and was sent home on PPI.  She was doing fine up until 2 days ago when she noticed some pain in the left breast.  She returned to [facility] ED today with left breast pain and was subsequently diagnosed with left breast cellulitis.  CT chest was done which ruled out any abscess.  She has mild leukocytosis.  Slightly tachycardic.  She is not febrile.  She has received cefepime as well as vancomycin.  She has also received 2 L of IV fluids.  ED physician requested admission for treatment of the left breast cellulitis.  [facility] will assume care once patient will arrive at [facility].  Until then, EDP remains sole responsible for patient's care.  Patient's COVID test is pending.  Her lactic acid is normal.  Pregnancy test is negative and she is not lactating.

## 2021-02-02 NOTE — ED Triage Notes (Addendum)
Patient was just discharge from [facility] on Monday for GI bleed and came here for left breast pain and constipation.  Last BM was last week, Tuesday.  Breast pain started Tuesday evening.  Patient noticed the pain when she was taking a shower Tuesday evening.  Denies any injuries.

## 2021-02-03 ENCOUNTER — Inpatient Hospital Stay (HOSPITAL_COMMUNITY): Payer: Medicaid Other

## 2021-02-03 DIAGNOSIS — Z79890 Hormone replacement therapy (postmenopausal): Secondary | ICD-10-CM | POA: Diagnosis not present

## 2021-02-03 DIAGNOSIS — R590 Localized enlarged lymph nodes: Secondary | ICD-10-CM | POA: Diagnosis present

## 2021-02-03 DIAGNOSIS — Z79899 Other long term (current) drug therapy: Secondary | ICD-10-CM | POA: Diagnosis not present

## 2021-02-03 DIAGNOSIS — Z8249 Family history of ischemic heart disease and other diseases of the circulatory system: Secondary | ICD-10-CM | POA: Diagnosis not present

## 2021-02-03 DIAGNOSIS — Z8719 Personal history of other diseases of the digestive system: Secondary | ICD-10-CM | POA: Diagnosis not present

## 2021-02-03 DIAGNOSIS — N644 Mastodynia: Secondary | ICD-10-CM | POA: Diagnosis present

## 2021-02-03 DIAGNOSIS — K760 Fatty (change of) liver, not elsewhere classified: Secondary | ICD-10-CM | POA: Diagnosis present

## 2021-02-03 DIAGNOSIS — K2100 Gastro-esophageal reflux disease with esophagitis, without bleeding: Secondary | ICD-10-CM | POA: Diagnosis present

## 2021-02-03 DIAGNOSIS — Z20822 Contact with and (suspected) exposure to covid-19: Secondary | ICD-10-CM | POA: Diagnosis present

## 2021-02-03 DIAGNOSIS — Z809 Family history of malignant neoplasm, unspecified: Secondary | ICD-10-CM | POA: Diagnosis not present

## 2021-02-03 DIAGNOSIS — Z825 Family history of asthma and other chronic lower respiratory diseases: Secondary | ICD-10-CM | POA: Diagnosis not present

## 2021-02-03 DIAGNOSIS — N61 Mastitis without abscess: Secondary | ICD-10-CM | POA: Diagnosis not present

## 2021-02-03 DIAGNOSIS — N611 Abscess of the breast and nipple: Secondary | ICD-10-CM | POA: Diagnosis present

## 2021-02-03 DIAGNOSIS — K589 Irritable bowel syndrome without diarrhea: Secondary | ICD-10-CM | POA: Diagnosis present

## 2021-02-03 DIAGNOSIS — K2970 Gastritis, unspecified, without bleeding: Secondary | ICD-10-CM | POA: Diagnosis present

## 2021-02-03 DIAGNOSIS — G43909 Migraine, unspecified, not intractable, without status migrainosus: Secondary | ICD-10-CM | POA: Diagnosis present

## 2021-02-03 DIAGNOSIS — F17200 Nicotine dependence, unspecified, uncomplicated: Secondary | ICD-10-CM | POA: Diagnosis present

## 2021-02-03 DIAGNOSIS — J4520 Mild intermittent asthma, uncomplicated: Secondary | ICD-10-CM | POA: Diagnosis present

## 2021-02-03 DIAGNOSIS — N6451 Induration of breast: Secondary | ICD-10-CM | POA: Diagnosis present

## 2021-02-03 DIAGNOSIS — Z886 Allergy status to analgesic agent status: Secondary | ICD-10-CM | POA: Diagnosis not present

## 2021-02-03 DIAGNOSIS — R234 Changes in skin texture: Secondary | ICD-10-CM | POA: Diagnosis present

## 2021-02-03 DIAGNOSIS — K219 Gastro-esophageal reflux disease without esophagitis: Secondary | ICD-10-CM | POA: Diagnosis present

## 2021-02-03 DIAGNOSIS — K5900 Constipation, unspecified: Secondary | ICD-10-CM | POA: Diagnosis present

## 2021-02-03 LAB — CBC
HCT: 35.9 % — ABNORMAL LOW (ref 36.0–46.0)
Hemoglobin: 12.1 g/dL (ref 12.0–15.0)
MCH: 31.8 pg (ref 26.0–34.0)
MCHC: 33.7 g/dL (ref 30.0–36.0)
MCV: 94.5 fL (ref 80.0–100.0)
Platelets: 283 10*3/uL (ref 150–400)
RBC: 3.8 MIL/uL — ABNORMAL LOW (ref 3.87–5.11)
RDW: 13.9 % (ref 11.5–15.5)
WBC: 14.3 10*3/uL — ABNORMAL HIGH (ref 4.0–10.5)
nRBC: 0 % (ref 0.0–0.2)

## 2021-02-03 MED ORDER — POLYETHYLENE GLYCOL 3350 17 G PO PACK
17.0000 g | PACK | Freq: Every day | ORAL | Status: DC
  Administered 2021-02-03 – 2021-02-05 (×2): 17 g via ORAL
  Filled 2021-02-03 (×3): qty 1

## 2021-02-03 MED ORDER — OXYCODONE HCL 5 MG PO TABS
10.0000 mg | ORAL_TABLET | ORAL | Status: DC | PRN
Start: 2021-02-03 — End: 2021-02-05
  Administered 2021-02-03 – 2021-02-05 (×9): 10 mg via ORAL
  Filled 2021-02-03 (×9): qty 2

## 2021-02-03 MED ORDER — MORPHINE SULFATE (PF) 4 MG/ML IV SOLN
4.0000 mg | INTRAVENOUS | Status: DC | PRN
  Administered 2021-02-03 – 2021-02-05 (×10): 4 mg via INTRAVENOUS
  Filled 2021-02-03 (×10): qty 1

## 2021-02-03 MED ORDER — VANCOMYCIN HCL IN DEXTROSE 1-5 GM/200ML-% IV SOLN
1000.0000 mg | Freq: Two times a day (BID) | INTRAVENOUS | Status: DC
  Administered 2021-02-03 – 2021-02-04 (×3): 1000 mg via INTRAVENOUS
  Filled 2021-02-03 (×5): qty 200

## 2021-02-03 MED ORDER — LIP MEDEX EX OINT
TOPICAL_OINTMENT | CUTANEOUS | Status: AC
  Filled 2021-02-03: qty 7

## 2021-02-03 MED ORDER — PIPERACILLIN-TAZOBACTAM 3.375 G IVPB
3.3750 g | Freq: Three times a day (TID) | INTRAVENOUS | Status: DC
  Administered 2021-02-03 – 2021-02-05 (×6): 3.375 g via INTRAVENOUS
  Filled 2021-02-03 (×7): qty 50

## 2021-02-03 MED ORDER — FLEET ENEMA 7-19 GM/118ML RE ENEM
1.0000 | ENEMA | Freq: Once | RECTAL | Status: AC
  Administered 2021-02-03: 1 via RECTAL
  Filled 2021-02-03: qty 1

## 2021-02-03 NOTE — Progress Notes (Signed)
Pharmacy Antibiotic Note  [name] is a 40 y.o. female admitted on 02/02/2021 with mastitis of L breast, no abscess.  Pharmacy has been consulted for Vancomycin dosing.  Tmax 101.2 on 8/4 > 99.7 WBC 13 > 14  Plan: Vancomycin 1gm q12, for AUC 512, SCr 0.76, Vd 0.5 Discontinue Ancef  Height: 5\' 5"  (165.1 cm) Weight: 102.2 kg (225 lb 5 oz) IBW/kg (Calculated) : 57  Temp (24hrs), Avg:100 F (37.8 C), Min:99 F (37.2 C), Max:101.2 F (38.4 C)  Recent Labs  Lab 01/29/21 1223 01/30/21 0039 02/02/21 1352 02/02/21 1706 02/03/21 0526  WBC 8.2 7.9 13.4*  --  14.3*  CREATININE 0.83 0.92 0.76  --   --   LATICACIDVEN  --   --   --  0.7  --     Estimated Creatinine Clearance: 108.6 mL/min (by C-G formula based on SCr of 0.76 mg/dL).    Allergies  Allergen Reactions   Ibuprofen Other (See Comments)    Ulcer flare up    Tramadol Hives   Antimicrobials this admission: 8/4  Vanc/Cefepime x1 8/4 Cefazolin >> 8/5 8/5 Vancomycin >>  Dose adjustments this admission:  Microbiology results: No cx  Thank you for allowing pharmacy to be a part of this patient's care.  10/4 PharmD 02/03/2021 1:39 PM

## 2021-02-03 NOTE — Progress Notes (Signed)
PROGRESS NOTE    [name]  EEF:[id number] DOB: 1981/06/23 DOA: 02/02/2021 PCP: Pcp, No    Brief Narrative:  40 year old with history of asthma, migraine, recent admission for esophagitis gastritis admitted with 2 days of severe pain and swelling left inferior aspect of the breast.  Reported negative mammogram 3 months ago.  No injury, not currently lactating or breast-feeding.   Assessment & Plan:   Principal Problem:   Cellulitis of left breast Active Problems:   Asthma, mild intermittent, well-controlled  Left breast cellulitis: Significant swelling and induration with no evidence of underlying abscess.  Due to significant symptoms, treated with IV antibiotics.  Adequate oral and IV pain opiates for pain relief. Recent gastritis and upper GI bleeding, avoiding NSAIDs. Discussed with surgery, will be seen in consultation.  Anticipate conservative management. Will broaden antibiotics, add vancomycin today.  GERD/gastritis: Symptoms improved.  Currently on Protonix 40 mg twice daily.  Asthma: Mild intermittent asthma.  Stable.  Albuterol as needed.  Constipation: We will use 1 dose of Fleet enema and scheduled MiraLAX today.  Discussed case with surgery for consultation.   DVT prophylaxis: SCDs Start: 02/02/21 2046   Code Status: Full code Family Communication: None Disposition Plan: Status is: Observation  The patient will require care spanning > 2 midnights and should be moved to inpatient because: Ongoing active pain requiring inpatient pain management and Inpatient level of care appropriate due to severity of illness  Dispo: The patient is from: Home              Anticipated d/c is to: Home              Patient currently is not medically stable to d/c.   Difficult to place patient No         Consultants:  General surgery  Procedures:  None  Antimicrobials:  Vancomycin and cefazolin 8/4. Cefazolin 8/4----   Subjective: Patient seen and  examined.  Complains of severe pain and tenderness along the left breast.  Overnight temperature 101.2.  Denies any nausea vomiting. Patient has been constipated with no successful bowel movement since last 1 week.  She wants some laxatives.  Objective: Vitals:   02/02/21 1941 02/02/21 2351 02/03/21 0355 02/03/21 0759  BP: (!) 154/85 135/79 (!) 148/88 (!) 135/93  Pulse: 89 88 93 90  Resp: 18 16 18 18   Temp: (!) 101.2 F (38.4 C) 99 F (37.2 C) 100 F (37.8 C) 99.7 F (37.6 C)  TempSrc: Oral Oral Oral Oral  SpO2: 100% 100% 100% 100%  Weight: 102.2 kg     Height:        Intake/Output Summary (Last 24 hours) at 02/03/2021 1328 Last data filed at 02/03/2021 0910 Gross per 24 hour  Intake 2490 ml  Output --  Net 2490 ml   Filed Weights   02/02/21 1342 02/02/21 1941  Weight: 97.5 kg 102.2 kg    Examination:  General exam: Appears calm and comfortable at rest. Breast examination: Examination done with bedside nursing staff as chaperone. Right breast with no nodules or tenderness. Left breast with significant edema/tender swelling with no fluctuation mostly on lower outer quadrant.  No secretions from the nipple. Respiratory system: Bilateral clear. Cardiovascular system: S1 & S2 heard, RRR.  Gastrointestinal system: Abdomen is nondistended, soft and nontender. No organomegaly or masses felt. Normal bowel sounds heard. Central nervous system: Alert and oriented. No focal neurological deficits. Extremities: Symmetric 5 x 5 power. Skin: No rashes, lesions or ulcers Psychiatry: Judgement  and insight appear normal. Mood & affect appropriate.     Data Reviewed: I have personally reviewed following labs and imaging studies  CBC: Recent Labs  Lab 01/29/21 1223 01/30/21 0039 02/02/21 1352 02/03/21 0526  WBC 8.2 7.9 13.4* 14.3*  NEUTROABS 4.7  --  10.5*  --   HGB 13.9 12.2 13.0 12.1  HCT 41.1 37.0 37.8 35.9*  MCV 93.2 94.1 92.0 94.5  PLT 385 310 304 283   Basic Metabolic  Panel: Recent Labs  Lab 01/29/21 1223 01/30/21 0039 02/02/21 1352  NA 139 139 137  K 4.0 3.5 3.6  CL 109 109 104  CO2 21* 23 22  GLUCOSE 102* 91 99  BUN 7 8 6   CREATININE 0.83 0.92 0.76  CALCIUM 9.6 8.8* 9.5   GFR: Estimated Creatinine Clearance: 108.6 mL/min (by C-G formula based on SCr of 0.76 mg/dL). Liver Function Tests: Recent Labs  Lab 01/29/21 1223 01/30/21 0039 02/02/21 1352  AST 16 15 13*  ALT 16 13 14   ALKPHOS 53 50 54  BILITOT 0.1* 0.4 0.7  PROT 7.4 6.6 7.4  ALBUMIN 4.0 3.5 4.2   Recent Labs  Lab 01/29/21 1223  LIPASE 24   No results for input(s): AMMONIA in the last 168 hours. Coagulation Profile: Recent Labs  Lab 01/30/21 0039  INR 1.1   Cardiac Enzymes: No results for input(s): CKTOTAL, CKMB, CKMBINDEX, TROPONINI in the last 168 hours. BNP (last 3 results) No results for input(s): PROBNP in the last 8760 hours. HbA1C: No results for input(s): HGBA1C in the last 72 hours. CBG: Recent Labs  Lab 01/30/21 0727  GLUCAP 92   Lipid Profile: No results for input(s): CHOL, HDL, LDLCALC, TRIG, CHOLHDL, LDLDIRECT in the last 72 hours. Thyroid Function Tests: No results for input(s): TSH, T4TOTAL, FREET4, T3FREE, THYROIDAB in the last 72 hours. Anemia Panel: No results for input(s): VITAMINB12, FOLATE, FERRITIN, TIBC, IRON, RETICCTPCT in the last 72 hours. Sepsis Labs: Recent Labs  Lab 02/02/21 1706  LATICACIDVEN 0.7    Recent Results (from the past 240 hour(s))  Resp Panel by RT-PCR (Flu A&B, Covid) Nasopharyngeal Swab     Status: None   Collection Time: 01/29/21  8:32 PM   Specimen: Nasopharyngeal Swab; Nasopharyngeal(NP) swabs in vial transport medium  Result Value Ref Range Status   SARS Coronavirus 2 by RT PCR NEGATIVE NEGATIVE Final    Comment: (NOTE) SARS-CoV-2 target nucleic acids are NOT DETECTED.  The SARS-CoV-2 RNA is generally detectable in upper respiratory specimens during the acute phase of infection. The  lowest concentration of SARS-CoV-2 viral copies this assay can detect is 138 copies/mL. A negative result does not preclude SARS-Cov-2 infection and should not be used as the sole basis for treatment or other patient management decisions. A negative result may occur with  improper specimen collection/handling, submission of specimen other than nasopharyngeal swab, presence of viral mutation(s) within the areas targeted by this assay, and inadequate number of viral copies(<138 copies/mL). A negative result must be combined with clinical observations, patient history, and epidemiological information. The expected result is Negative.  Fact Sheet for Patients:  04/04/21  Fact Sheet for Healthcare Providers:  01/31/21  This test is no t yet approved or cleared by the [employer] FDA and  has been authorized for detection and/or diagnosis of SARS-CoV-2 by FDA under an Emergency Use Authorization (EUA). This EUA will remain  in effect (meaning this test can be used) for the duration of the COVID-19 declaration under Section 564(b)(1) of  the Act, 21 U.S.C.section 360bbb-3(b)(1), unless the authorization is terminated  or revoked sooner.       Influenza A by PCR NEGATIVE NEGATIVE Final   Influenza B by PCR NEGATIVE NEGATIVE Final    Comment: (NOTE) The Xpert Xpress SARS-CoV-2/FLU/RSV plus assay is intended as an aid in the diagnosis of influenza from Nasopharyngeal swab specimens and should not be used as a sole basis for treatment. Nasal washings and aspirates are unacceptable for Xpert Xpress SARS-CoV-2/FLU/RSV testing.  Fact Sheet for Patients: BloggerCourse.comhttps://www.fda.gov/media/152166/download  Fact Sheet for Healthcare Providers: SeriousBroker.ithttps://www.fda.gov/media/152162/download  This test is not yet approved or cleared by the [place] FDA and has been authorized for detection and/or diagnosis of SARS-CoV-2 by FDA under  an Emergency Use Authorization (EUA). This EUA will remain in effect (meaning this test can be used) for the duration of the COVID-19 declaration under Section 564(b)(1) of the Act, 21 U.S.C. section 360bbb-3(b)(1), unless the authorization is terminated or revoked.  Performed at [facility], [address] [place], [place] [address]   Blood culture (routine x 2)     Status: None (Preliminary result)   Collection Time: 02/02/21  4:53 PM   Specimen: BLOOD  Result Value Ref Range Status   Specimen Description   Final    BLOOD RIGHT ANTECUBITAL Performed at [facility], [address], [place], [place] [address]    Special Requests   Final    Blood Culture adequate volume BOTTLES DRAWN AEROBIC AND ANAEROBIC Performed at [facility], [address], [place], [place] [address]    Culture   Final    NO GROWTH < 24 HOURS Performed at [facility], [address] [place], [place] [address]    Report Status PENDING  Incomplete  Blood culture (routine x 2)     Status: None (Preliminary result)   Collection Time: 02/02/21  4:55 PM   Specimen: BLOOD  Result Value Ref Range Status   Specimen Description   Final    BLOOD BLOOD LEFT WRIST BLOOD RIGHT WRIST Performed at [facility], [address], [place], [place] [address]    Special Requests   Final    Blood Culture adequate volume BOTTLES DRAWN AEROBIC AND ANAEROBIC Performed at [facility], [address], [place], [place] [address]    Culture   Final    NO GROWTH < 24 HOURS Performed at [facility], [address] [place], [place] [address]    Report Status PENDING  Incomplete  Resp Panel by RT-PCR (Flu A&B, Covid) Nasopharyngeal Swab     Status: None   Collection Time: 02/02/21  5:06 PM   Specimen: Nasopharyngeal Swab; Nasopharyngeal(NP) swabs in vial transport medium  Result Value Ref Range Status   SARS Coronavirus 2 by  RT PCR NEGATIVE NEGATIVE Final    Comment: (NOTE) SARS-CoV-2 target nucleic acids are NOT DETECTED.  The SARS-CoV-2 RNA is generally detectable in upper respiratory specimens during the acute phase of infection. The lowest concentration of SARS-CoV-2 viral copies this assay can detect is 138 copies/mL. A negative result does not preclude SARS-Cov-2 infection and should not be used as the sole basis for treatment or other patient management decisions. A negative result may occur with  improper specimen collection/handling, submission of specimen other than nasopharyngeal swab, presence of viral mutation(s) within the areas targeted by this assay, and inadequate number of viral copies(<138 copies/mL). A negative result must be combined with clinical observations, patient history,  and epidemiological information. The expected result is Negative.  Fact Sheet for Patients:  BloggerCourse.com  Fact Sheet for Healthcare Providers:  SeriousBroker.it  This test is no t yet approved or cleared by the [place] FDA and  has been authorized for detection and/or diagnosis of SARS-CoV-2 by FDA under an Emergency Use Authorization (EUA). This EUA will remain  in effect (meaning this test can be used) for the duration of the COVID-19 declaration under Section 564(b)(1) of the Act, 21 U.S.C.section 360bbb-3(b)(1), unless the authorization is terminated  or revoked sooner.       Influenza A by PCR NEGATIVE NEGATIVE Final   Influenza B by PCR NEGATIVE NEGATIVE Final    Comment: (NOTE) The Xpert Xpress SARS-CoV-2/FLU/RSV plus assay is intended as an aid in the diagnosis of influenza from Nasopharyngeal swab specimens and should not be used as a sole basis for treatment. Nasal washings and aspirates are unacceptable for Xpert Xpress SARS-CoV-2/FLU/RSV testing.  Fact Sheet for Patients: BloggerCourse.com  Fact Sheet  for Healthcare Providers: SeriousBroker.it  This test is not yet approved or cleared by the [place] FDA and has been authorized for detection and/or diagnosis of SARS-CoV-2 by FDA under an Emergency Use Authorization (EUA). This EUA will remain in effect (meaning this test can be used) for the duration of the COVID-19 declaration under Section 564(b)(1) of the Act, 21 U.S.C. section 360bbb-3(b)(1), unless the authorization is terminated or revoked.  Performed at [facility], [address] [place], [place] [address]          Radiology Studies: CT CHEST W CONTRAST  Result Date: 02/02/2021 CLINICAL DATA:  Chest wall pain, LEFT breast swelling and pain, history of GI bleed. EXAM: CT CHEST WITH CONTRAST TECHNIQUE: Multidetector CT imaging of the chest was performed during intravenous contrast administration. CONTRAST:  19mL OMNIPAQUE IOHEXOL 300 MG/ML  SOLN COMPARISON:  Comparison made with abdominal imaging from January 29, 2021. FINDINGS: Cardiovascular: Heart size normal without pericardial effusion. Normal caliber of the thoracic aorta. Central pulmonary vasculature is normal caliber. Mediastinum/Nodes: Mildly enlarged LEFT axillary lymph nodes in the range of 9 mm to 1 cm short axis. Many of these retain fatty hila. No thoracic inlet adenopathy. No internal mammary adenopathy. No mediastinal or hilar lymphadenopathy. Esophagus grossly normal. Lungs/Pleura: Basilar atelectasis. No effusion. No consolidative changes. Airways are patent. Upper Abdomen: Incidental imaging of upper abdominal contents with hepatic steatosis. Imaged portions of upper abdominal viscera otherwise unremarkable on very limited assessment with limited coverage. Musculoskeletal: Marked skin thickening and stranding within the subcutaneous fat of the LEFT breast. Asymmetry in general of LEFT breast with increased density in the area of glandular tissue throughout the LEFT  breast compared to the RIGHT breast. No focal fluid collection. The inferior aspect of the LEFT breast could be seen on prior imaging from July and did not appear to show these changes. The breast is incompletely evaluated however on CT and was not imaged in its entirety on prior CT of the abdomen. IMPRESSION: 1. Marked skin thickening about the LEFT breast with marked stranding throughout the LEFT breast in the subcutaneous fat. Findings suggest cellulitis/marked mastitis without fluid collection. This would be difficult to differentiate from inflammatory breast neoplasm. Correlate with physical exam and breast imaging along with any signs of infection clinically. 2. Mildly enlarged LEFT axillary lymph nodes in the range of 9 mm to 1 cm, potentially reactive, though nonspecific in the current context. 3. Hepatic steatosis Electronically Signed   By: Juliene Pina  Wile M.D.   On: 02/02/2021 16:00   DG ABD ACUTE 2+V W 1V CHEST  Result Date: 02/02/2021 CLINICAL DATA:  Constipation. EXAM: DG ABDOMEN ACUTE WITH 1 VIEW CHEST COMPARISON:  01/29/2021 FINDINGS: Heart size and mediastinal contours are unremarkable. No pleural effusion or edema. No airspace densities. No dilated small bowel loops. Moderate stool burden identified within the colon up to the level of the rectum. IMPRESSION: Moderate stool burden within the colon compatible with constipation. Electronically Signed   By: Signa Kell M.D.   On: 02/02/2021 14:53        Scheduled Meds:  pantoprazole  40 mg Oral BID   polyethylene glycol  17 g Oral Daily   Continuous Infusions:   ceFAZolin (ANCEF) IV 1 g (02/03/21 1321)     LOS: 0 days    Time spent: 30 minutes    Dorcas Carrow, MD [facility] Pager [phone]

## 2021-02-03 NOTE — Consult Note (Signed)
[name] Dec 28, 1980  [id number].     Requesting MD: Dr. Dorcas Carrow Chief Complaint/Reason for Consult: left breast mastitis   HPI:  This is a pleasant 40 yo black female with a history of GERD who was just admitted over the weekend for coffee-ground emesis and underwent an upper endoscopy with gastritis as source of her coffee-ground emesis.  She was started on treatment and was discharged home on 8/1. The following day she noticed her left breast was tender like prior to her menstrual cycle.  She is on depo-provera at home.  She went to bed and then following day her pain persisted and continued to worsen.  She developed some fevers at home and decided to come back to the ED for evaluation.  She underwent a CT of the chest that reveals mastitis of her left breast with no abscess noted.  She has had a mammogram this year with no findings.  She denies any family history of breast cancer.  She has been admitted and started on IV abx therapy, ancef right now.  Her WBC is 14K today.  We have been asked to see her for further evaluation and recommendations.   ROS: ROS: Please see HPI, otherwise all other systems have been reviewed and are negative.        Family History  Problem Relation Age of Onset   Migraines Mother     Asthma Mother     Migraines Father     Heart disease Father     Asthma Daughter     Cancer Maternal Grandmother            Past Medical History:  Diagnosis Date   Anemia     Asthma     Erosive esophagitis      distal esophagus, with low grade bleed 03/2009   GERD (gastroesophageal reflux disease)     Headache(784.0)     History of migraine headaches     IBS (irritable bowel syndrome)     Infection      UTI           Past Surgical History:  Procedure Laterality Date   BIOPSY   01/30/2021    Procedure: BIOPSY;  Surgeon: Beverley Fiedler, MD;  Location: [facility] ENDOSCOPY;  Service: Gastroenterology;;   ESOPHAGOGASTRODUODENOSCOPY (EGD) WITH PROPOFOL  N/A 01/30/2021    Procedure: ESOPHAGOGASTRODUODENOSCOPY (EGD) WITH PROPOFOL;  Surgeon: Beverley Fiedler, MD;  Location: [facility] ENDOSCOPY;  Service: Gastroenterology;  Laterality: N/A;   LUMBAR LAMINECTOMY/DECOMPRESSION MICRODISCECTOMY Right 01/05/2016    Procedure: Right Lumbar three-four Microdiskectomy;  Surgeon: Coletta Memos, MD;  Location: [facility] NEURO ORS;  Service: Neurosurgery;  Laterality: Right;  right   WRIST SURGERY Right        Social History:  reports that she has been smoking. She has never used smokeless tobacco. She reports that she does not drink alcohol and does not use drugs.   Allergies:       Allergies  Allergen Reactions   Ibuprofen Other (See Comments)      Ulcer flare up     Tramadol Hives            Medications Prior to Admission  Medication Sig Dispense Refill   Acetaminophen-Codeine 300-30 MG tablet Take 1 tablet by mouth every 6 (six) hours as needed for pain.       albuterol (VENTOLIN HFA) 108 (90 Base) MCG/ACT inhaler Inhale into the lungs every 6 (six) hours as needed  for wheezing or shortness of breath.       medroxyPROGESTERone (DEPO-PROVERA) 150 MG/ML injection Inject 150 mg into the muscle every 3 (three) months.       pantoprazole (PROTONIX) 40 MG tablet Take 1 tablet (40 mg total) by mouth 2 (two) times daily. 120 tablet 0        Physical Exam: Blood pressure (!) 135/93, pulse 90, temperature 99.7 F (37.6 C), temperature source Oral, resp. rate 18, height 5\' 5"  (1.651 m), weight 102.2 kg, SpO2 100 %. General: pleasant, WD, WN black female who is laying in bed in NAD HEENT: head is normocephalic, atraumatic.  Sclera are noninjected.  PERRL.  Ears and nose without any masses or lesions.  Mouth is pink and moist Heart: regular, rate, and rhythm.  Normal s1,s2. No obvious murmurs, gallops, or rubs noted.  Palpable radial and pedal pulses bilaterally Lungs: CTAB, no wheezes, rhonchi, or rales noted.  Respiratory effort nonlabored Breast: left breast with  cellulitis extending circumferentially.  Tender greatest over nipple and areola.  No fluctuance noted or evidence of abscess Abd: soft, NT, ND, +BS, no masses, hernias, or organomegaly MS: all 4 extremities are symmetrical with no cyanosis, clubbing, or edema. Skin: warm and dry with no masses, lesions, or rashes Neuro: Cranial nerves 2-12 grossly intact, sensation is normal throughout Psych: A&Ox3 with an appropriate affect.     Lab Results Last 48 Hours        Results for orders placed or performed during the hospital encounter of 02/02/21 (from the past 48 hour(s))  Comprehensive metabolic panel     Status: Abnormal    Collection Time: 02/02/21  1:52 PM  Result Value Ref Range    Sodium 137 135 - 145 mmol/L    Potassium 3.6 3.5 - 5.1 mmol/L    Chloride 104 98 - 111 mmol/L    CO2 22 22 - 32 mmol/L    Glucose, Bld 99 70 - 99 mg/dL      Comment: Glucose reference range applies only to samples taken after fasting for at least 8 hours.    BUN 6 6 - 20 mg/dL    Creatinine, Ser 1.610.76 0.44 - 1.00 mg/dL    Calcium 9.5 8.9 - 09.610.3 mg/dL    Total Protein 7.4 6.5 - 8.1 g/dL    Albumin 4.2 3.5 - 5.0 g/dL    AST 13 (L) 15 - 41 U/L    ALT 14 0 - 44 U/L    Alkaline Phosphatase 54 38 - 126 U/L    Total Bilirubin 0.7 0.3 - 1.2 mg/dL    GFR, Estimated >04>60 >54>60 mL/min      Comment: (NOTE) Calculated using the CKD-EPI Creatinine Equation (2021)      Anion gap 11 5 - 15      Comment: Performed at [facility], [address], [place], [place] [id number]  CBC with Differential     Status: Abnormal    Collection Time: 02/02/21  1:52 PM  Result Value Ref Range    WBC 13.4 (H) 4.0 - 10.5 K/uL    RBC 4.11 3.87 - 5.11 MIL/uL    Hemoglobin 13.0 12.0 - 15.0 g/dL    HCT 91.437.8 78.236.0 - 95.646.0 %    MCV 92.0 80.0 - 100.0 fL    MCH 31.6 26.0 - 34.0 pg    MCHC 34.4 30.0 - 36.0 g/dL    RDW 21.313.8 08.611.5 - 57.815.5 %    Platelets 304 150 - 400  K/uL    nRBC 0.0 0.0 - 0.2 %    Neutrophils Relative % 79  %    Neutro Abs 10.5 (H) 1.7 - 7.7 K/uL    Lymphocytes Relative 14 %    Lymphs Abs 1.8 0.7 - 4.0 K/uL    Monocytes Relative 7 %    Monocytes Absolute 1.0 0.1 - 1.0 K/uL    Eosinophils Relative 0 %    Eosinophils Absolute 0.1 0.0 - 0.5 K/uL    Basophils Relative 0 %    Basophils Absolute 0.0 0.0 - 0.1 K/uL    Immature Granulocytes 0 %    Abs Immature Granulocytes 0.04 0.00 - 0.07 K/uL      Comment: Performed at [facility], [address] [place], [place] [address]  Pregnancy, urine     Status: None    Collection Time: 02/02/21  2:00 PM  Result Value Ref Range    Preg Test, Ur NEGATIVE NEGATIVE      Comment:        THE SENSITIVITY OF THIS METHODOLOGY IS >20 mIU/mL. Performed at [facility], [address] [place], [place] [address]    Blood culture (routine x 2)     Status: None (Preliminary result)    Collection Time: 02/02/21  4:53 PM    Specimen: BLOOD  Result Value Ref Range    Specimen Description          BLOOD RIGHT ANTECUBITAL Performed at [facility], [address] [place], [place] [address]      Special Requests          Blood Culture adequate volume BOTTLES DRAWN AEROBIC AND ANAEROBIC Performed at [facility], [address], [place], [place] [address]      Culture          NO GROWTH < 24 HOURS Performed at [facility], [address], [place], [place] [address]      Report Status PENDING    Blood culture (routine x 2)     Status: None (Preliminary result)    Collection Time: 02/02/21  4:55 PM    Specimen: BLOOD  Result Value Ref Range    Specimen Description          BLOOD BLOOD LEFT WRIST BLOOD RIGHT WRIST Performed at [facility], [address] [place], [place] [address]      Special Requests          Blood Culture adequate volume BOTTLES DRAWN AEROBIC AND ANAEROBIC Performed at [facility], [address],  [place], [place] [address]      Culture          NO GROWTH < 24 HOURS Performed at [facility], [address], [place], [place] [address]      Report Status PENDING    Resp Panel by RT-PCR (Flu A&B, Covid) Nasopharyngeal Swab     Status: None    Collection Time: 02/02/21  5:06 PM    Specimen: Nasopharyngeal Swab; Nasopharyngeal(NP) swabs in vial transport medium  Result Value Ref Range    SARS Coronavirus 2 by RT PCR NEGATIVE NEGATIVE      Comment: (NOTE) SARS-CoV-2 target nucleic acids are NOT DETECTED.   The SARS-CoV-2 RNA is generally detectable in upper respiratory specimens during the acute phase of infection. The lowest concentration of SARS-CoV-2 viral copies this assay can detect is 138 copies/mL. A negative result does not preclude SARS-Cov-2 infection and should not be  used as the sole basis for treatment or other patient management decisions. A negative result may occur with improper specimen collection/handling, submission of specimen other than nasopharyngeal swab, presence of viral mutation(s) within the areas targeted by this assay, and inadequate number of viral copies(<138 copies/mL). A negative result must be combined with clinical observations, patient history, and epidemiological information. The expected result is Negative.   Fact Sheet for Patients: BloggerCourse.com   Fact Sheet for Healthcare Providers: SeriousBroker.it   This test is no t yet approved or cleared by the [place] FDA and has been authorized for detection and/or diagnosis of SARS-CoV-2 by FDA under an Emergency Use Authorization (EUA). This EUA will remain in effect (meaning this test can be used) for the duration of the COVID-19 declaration under Section 564(b)(1) of the Act, 21 U.S.C.section 360bbb-3(b)(1), unless the authorization is terminated or revoked sooner.          Influenza A by PCR NEGATIVE NEGATIVE    Influenza B by  PCR NEGATIVE NEGATIVE      Comment: (NOTE) The Xpert Xpress SARS-CoV-2/FLU/RSV plus assay is intended as an aid in the diagnosis of influenza from Nasopharyngeal swab specimens and should not be used as a sole basis for treatment. Nasal washings and aspirates are unacceptable for Xpert Xpress SARS-CoV-2/FLU/RSV testing.   Fact Sheet for Patients: BloggerCourse.com   Fact Sheet for Healthcare Providers: SeriousBroker.it   This test is not yet approved or cleared by the [place] FDA and has been authorized for detection and/or diagnosis of SARS-CoV-2 by FDA under an Emergency Use Authorization (EUA). This EUA will remain in effect (meaning this test can be used) for the duration of the COVID-19 declaration under Section 564(b)(1) of the Act, 21 U.S.C. section 360bbb-3(b)(1), unless the authorization is terminated or revoked.   Performed at [facility], [address] [place], [place] [address]    Lactic acid, plasma     Status: None    Collection Time: 02/02/21  5:06 PM  Result Value Ref Range    Lactic Acid, Venous 0.7 0.5 - 1.9 mmol/L      Comment: Performed at [facility], [address] [place], [place], [place] [address]  CBC     Status: Abnormal    Collection Time: 02/03/21  5:26 AM  Result Value Ref Range    WBC 14.3 (H) 4.0 - 10.5 K/uL    RBC 3.80 (L) 3.87 - 5.11 MIL/uL    Hemoglobin 12.1 12.0 - 15.0 g/dL    HCT 78.5 (L) 88.5 - 46.0 %    MCV 94.5 80.0 - 100.0 fL    MCH 31.8 26.0 - 34.0 pg    MCHC 33.7 30.0 - 36.0 g/dL    RDW 02.7 74.1 - 28.7 %    Platelets 283 150 - 400 K/uL    nRBC 0.0 0.0 - 0.2 %      Comment: Performed at [facility], [address], [place], [place] [address]       Imaging Results (Last 48 hours)  CT CHEST W CONTRAST   Result Date: 02/02/2021 CLINICAL DATA:  Chest wall pain, LEFT breast swelling and pain, history of GI bleed. EXAM: CT  CHEST WITH CONTRAST TECHNIQUE: Multidetector CT imaging of the chest was performed during intravenous contrast administration. CONTRAST:  65mL OMNIPAQUE IOHEXOL 300 MG/ML  SOLN COMPARISON:  Comparison made with abdominal imaging from January 29, 2021. FINDINGS: Cardiovascular: Heart size normal without pericardial effusion. Normal caliber of the thoracic aorta.  Central pulmonary vasculature is normal caliber. Mediastinum/Nodes: Mildly enlarged LEFT axillary lymph nodes in the range of 9 mm to 1 cm short axis. Many of these retain fatty hila. No thoracic inlet adenopathy. No internal mammary adenopathy. No mediastinal or hilar lymphadenopathy. Esophagus grossly normal. Lungs/Pleura: Basilar atelectasis. No effusion. No consolidative changes. Airways are patent. Upper Abdomen: Incidental imaging of upper abdominal contents with hepatic steatosis. Imaged portions of upper abdominal viscera otherwise unremarkable on very limited assessment with limited coverage. Musculoskeletal: Marked skin thickening and stranding within the subcutaneous fat of the LEFT breast. Asymmetry in general of LEFT breast with increased density in the area of glandular tissue throughout the LEFT breast compared to the RIGHT breast. No focal fluid collection. The inferior aspect of the LEFT breast could be seen on prior imaging from July and did not appear to show these changes. The breast is incompletely evaluated however on CT and was not imaged in its entirety on prior CT of the abdomen. IMPRESSION: 1. Marked skin thickening about the LEFT breast with marked stranding throughout the LEFT breast in the subcutaneous fat. Findings suggest cellulitis/marked mastitis without fluid collection. This would be difficult to differentiate from inflammatory breast neoplasm. Correlate with physical exam and breast imaging along with any signs of infection clinically. 2. Mildly enlarged LEFT axillary lymph nodes in the range of 9 mm to 1 cm, potentially  reactive, though nonspecific in the current context. 3. Hepatic steatosis Electronically Signed   By: Donzetta Kohut M.D.   On: 02/02/2021 16:00   DG ABD ACUTE 2+V W 1V CHEST   Result Date: 02/02/2021 CLINICAL DATA:  Constipation. EXAM: DG ABDOMEN ACUTE WITH 1 VIEW CHEST COMPARISON:  01/29/2021 FINDINGS: Heart size and mediastinal contours are unremarkable. No pleural effusion or edema. No airspace densities. No dilated small bowel loops. Moderate stool burden identified within the colon up to the level of the rectum. IMPRESSION: Moderate stool burden within the colon compatible with constipation. Electronically Signed   By: Signa Kell M.D.   On: 02/02/2021 14:53           Assessment/Plan Left breast mastitis The patient appears to have mastitis of unclear etiology.  She rarely smokes a black and mild in stressful situations but otherwise does not smoke.  She has had no trauma, bite, or otherwise to this breast.  She does not have evidence of an abscess that will require drainage/aspiration.  For now, medical management with IV abx is recommended.  She is on Ancef currently.  We broaden this to vanc zosyn given elevated WBC and fever.  We will also order an [place] as this is more sensitive to breast abscess just to make sure we aren't missing anything else.  We will continue to follow with you at this time.   FEN - regular VTE - SCDs, ok for chemical prophylaxis from our standpoint ID - Ancef   Letha Cape, [facility] 02/03/2021, 12:59 PM Please see Amion for pager number during day hours 7:00am-4:30pm or 7:00am -11:30am on weekends

## 2021-02-03 NOTE — H&P (Deleted)
[name] 10-05-1980  [id number].    Requesting MD: Dr. Dorcas CarrowKuber [name] Chief Complaint/Reason for Consult: left breast mastitis  HPI:  This is a pleasant 40 yo black female with a history of GERD who was just admitted over the weekend for coffee-ground emesis and underwent an upper endoscopy with gastritis as source of her coffee-ground emesis.  She was started on treatment and was discharged home on 8/1. The following day she noticed her left breast was tender like prior to her menstrual cycle.  She is on depo-provera at home.  She went to bed and then following day her pain persisted and continued to worsen.  She developed some fevers at home and decided to come back to the ED for evaluation.  She underwent a CT of the chest that reveals mastitis of her left breast with no abscess noted.  She has had a mammogram this year with no findings.  She denies any family history of breast cancer.  She has been admitted and started on IV abx therapy, ancef right now.  Her WBC is 14K today.  We have been asked to see her for further evaluation and recommendations.  ROS: ROS: Please see HPI, otherwise all other systems have been reviewed and are negative.  Family History  Problem Relation Age of Onset   Migraines Mother    Asthma Mother    Migraines Father    Heart disease Father    Asthma Daughter    Cancer Maternal Grandmother     Past Medical History:  Diagnosis Date   Anemia    Asthma    Erosive esophagitis    distal esophagus, with low grade bleed 03/2009   GERD (gastroesophageal reflux disease)    Headache(784.0)    History of migraine headaches    IBS (irritable bowel syndrome)    Infection    UTI    Past Surgical History:  Procedure Laterality Date   BIOPSY  01/30/2021   Procedure: BIOPSY;  Surgeon: Beverley FiedlerPyrtle, Jay M, MD;  [facility];  Service: Gastroenterology;;   ESOPHAGOGASTRODUODENOSCOPY (EGD) WITH PROPOFOL N/A 01/30/2021   Procedure:  ESOPHAGOGASTRODUODENOSCOPY (EGD) WITH PROPOFOL;  Surgeon: Beverley FiedlerPyrtle, Jay M, MD;  [facility];  Service: Gastroenterology;  Laterality: N/A;   LUMBAR LAMINECTOMY/DECOMPRESSION MICRODISCECTOMY Right 01/05/2016   Procedure: Right Lumbar three-four Microdiskectomy;  Surgeon: Coletta MemosKyle Cabbell, MD;  Location: [facility] NEURO ORS;  Service: Neurosurgery;  Laterality: Right;  right   WRIST SURGERY Right     Social History:  reports that she has been smoking. She has never used smokeless tobacco. She reports that she does not drink alcohol and does not use drugs.  Allergies:  Allergies  Allergen Reactions   Ibuprofen Other (See Comments)    Ulcer flare up    Tramadol Hives    Medications Prior to Admission  Medication Sig Dispense Refill   Acetaminophen-Codeine 300-30 MG tablet Take 1 tablet by mouth every 6 (six) hours as needed for pain.     albuterol (VENTOLIN HFA) 108 (90 Base) MCG/ACT inhaler Inhale into the lungs every 6 (six) hours as needed for wheezing or shortness of breath.     medroxyPROGESTERone (DEPO-PROVERA) 150 MG/ML injection Inject 150 mg into the muscle every 3 (three) months.     pantoprazole (PROTONIX) 40 MG tablet Take 1 tablet (40 mg total) by mouth 2 (two) times daily. 120 tablet 0     Physical Exam: Blood pressure (!) 135/93, pulse 90, temperature 99.7 F (37.6 C), temperature  source Oral, resp. rate 18, height 5\' 5"  (1.651 m), weight 102.2 kg, SpO2 100 %. General: pleasant, WD, WN black female who is laying in bed in NAD HEENT: head is normocephalic, atraumatic.  Sclera are noninjected.  PERRL.  Ears and nose without any masses or lesions.  Mouth is pink and moist Heart: regular, rate, and rhythm.  Normal s1,s2. No obvious murmurs, gallops, or rubs noted.  Palpable radial and pedal pulses bilaterally Lungs: CTAB, no wheezes, rhonchi, or rales noted.  Respiratory effort nonlabored Breast: left breast with cellulitis extending circumferentially.  Tender greatest over  nipple and areola.  No fluctuance noted or evidence of abscess Abd: soft, NT, ND, +BS, no masses, hernias, or organomegaly MS: all 4 extremities are symmetrical with no cyanosis, clubbing, or edema. Skin: warm and dry with no masses, lesions, or rashes Neuro: Cranial nerves 2-12 grossly intact, sensation is normal throughout Psych: A&Ox3 with an appropriate affect.   Results for orders placed or performed during the hospital encounter of 02/02/21 (from the past 48 hour(s))  Comprehensive metabolic panel     Status: Abnormal   Collection Time: 02/02/21  1:52 PM  Result Value Ref Range   Sodium 137 135 - 145 mmol/L   Potassium 3.6 3.5 - 5.1 mmol/L   Chloride 104 98 - 111 mmol/L   CO2 22 22 - 32 mmol/L   Glucose, Bld 99 70 - 99 mg/dL    Comment: Glucose reference range applies only to samples taken after fasting for at least 8 hours.   BUN 6 6 - 20 mg/dL   Creatinine, Ser 04/04/21 0.44 - 1.00 mg/dL   Calcium 9.5 8.9 - 6.59 mg/dL   Total Protein 7.4 6.5 - 8.1 g/dL   Albumin 4.2 3.5 - 5.0 g/dL   AST 13 (L) 15 - 41 U/L   ALT 14 0 - 44 U/L   Alkaline Phosphatase 54 38 - 126 U/L   Total Bilirubin 0.7 0.3 - 1.2 mg/dL   GFR, Estimated 93.5 >70 mL/min    Comment: (NOTE) Calculated using the CKD-EPI Creatinine Equation (2021)    Anion gap 11 5 - 15    Comment: Performed at 06-27-1974, [address] [place], [place]  CBC with Differential     Status: Abnormal   Collection Time: 02/02/21  1:52 PM  Result Value Ref Range   WBC 13.4 (H) 4.0 - 10.5 K/uL   RBC 4.11 3.87 - 5.11 MIL/uL   Hemoglobin 13.0 12.0 - 15.0 g/dL   HCT 04/04/21 30.0 - 92.3 %   MCV 92.0 80.0 - 100.0 fL   MCH 31.6 26.0 - 34.0 pg   MCHC 34.4 30.0 - 36.0 g/dL   RDW 30.0 76.2 - 26.3 %   Platelets 304 150 - 400 K/uL   nRBC 0.0 0.0 - 0.2 %   Neutrophils Relative % 79 %   Neutro Abs 10.5 (H) 1.7 - 7.7 K/uL   Lymphocytes Relative 14 %   Lymphs Abs 1.8 0.7 - 4.0 K/uL   Monocytes Relative 7 %    Monocytes Absolute 1.0 0.1 - 1.0 K/uL   Eosinophils Relative 0 %   Eosinophils Absolute 0.1 0.0 - 0.5 K/uL   Basophils Relative 0 %   Basophils Absolute 0.0 0.0 - 0.1 K/uL   Immature Granulocytes 0 %   Abs Immature Granulocytes 0.04 0.00 - 0.07 K/uL    Comment: Performed at [facility] [place], [place]  Pregnancy, urine  Status: None   Collection Time: 02/02/21  2:00 PM  Result Value Ref Range   Preg Test, Ur NEGATIVE NEGATIVE    Comment:        THE SENSITIVITY OF THIS METHODOLOGY IS >20 mIU/mL. Performed at [facility], [address] [place], [place] [address]   Blood culture (routine x 2)     Status: None (Preliminary result)   Collection Time: 02/02/21  4:53 PM   Specimen: BLOOD  Result Value Ref Range   Specimen Description      BLOOD RIGHT ANTECUBITAL Performed at [facility], [address] [place], [place] [address]    Special Requests      Blood Culture adequate volume BOTTLES DRAWN AEROBIC AND ANAEROBIC Performed at [facility], [address], [place], [place] [address]    Culture      NO GROWTH < 24 HOURS Performed at [facility], [address], [place], [place] [address]    Report Status PENDING   Blood culture (routine x 2)     Status: None (Preliminary result)   Collection Time: 02/02/21  4:55 PM   Specimen: BLOOD  Result Value Ref Range   Specimen Description      BLOOD BLOOD LEFT WRIST BLOOD RIGHT WRIST Performed at [facility], [address], [place], [place] [address]    Special Requests      Blood Culture adequate volume BOTTLES DRAWN AEROBIC AND ANAEROBIC Performed at [facility], [address] [place], [place] [address]    Culture      NO GROWTH < 24 HOURS Performed at [facility], [address], [place], [place] [address]    Report Status PENDING   Resp Panel by RT-PCR  (Flu A&B, Covid) Nasopharyngeal Swab     Status: None   Collection Time: 02/02/21  5:06 PM   Specimen: Nasopharyngeal Swab; Nasopharyngeal(NP) swabs in vial transport medium  Result Value Ref Range   SARS Coronavirus 2 by RT PCR NEGATIVE NEGATIVE    Comment: (NOTE) SARS-CoV-2 target nucleic acids are NOT DETECTED.  The SARS-CoV-2 RNA is generally detectable in upper respiratory specimens during the acute phase of infection. The lowest concentration of SARS-CoV-2 viral copies this assay can detect is 138 copies/mL. A negative result does not preclude SARS-Cov-2 infection and should not be used as the sole basis for treatment or other patient management decisions. A negative result may occur with  improper specimen collection/handling, submission of specimen other than nasopharyngeal swab, presence of viral mutation(s) within the areas targeted by this assay, and inadequate number of viral copies(<138 copies/mL). A negative result must be combined with clinical observations, patient history, and epidemiological information. The expected result is Negative.  Fact Sheet for Patients:  BloggerCourse.com  Fact Sheet for Healthcare Providers:  SeriousBroker.it  This test is no t yet approved or cleared by the [place] FDA and  has been authorized for detection and/or diagnosis of SARS-CoV-2 by FDA under an Emergency Use Authorization (EUA). This EUA will remain  in effect (meaning this test can be used) for the duration of the COVID-19 declaration under Section 564(b)(1) of the Act, 21 U.S.C.section 360bbb-3(b)(1), unless the authorization is terminated  or revoked sooner.       Influenza A by PCR NEGATIVE NEGATIVE   Influenza B by PCR NEGATIVE NEGATIVE    Comment: (NOTE) The Xpert Xpress SARS-CoV-2/FLU/RSV plus assay is intended as an aid in the diagnosis of influenza from Nasopharyngeal  swab specimens and should not be used as  a sole basis for treatment. Nasal washings and aspirates are unacceptable for Xpert Xpress SARS-CoV-2/FLU/RSV testing.  Fact Sheet for Patients: BloggerCourse.com  Fact Sheet for Healthcare Providers: SeriousBroker.it  This test is not yet approved or cleared by the [place] FDA and has been authorized for detection and/or diagnosis of SARS-CoV-2 by FDA under an Emergency Use Authorization (EUA). This EUA will remain in effect (meaning this test can be used) for the duration of the COVID-19 declaration under Section 564(b)(1) of the Act, 21 U.S.C. section 360bbb-3(b)(1), unless the authorization is terminated or revoked.  Performed at [facility], [address] [place], [place] [address]   Lactic acid, plasma     Status: None   Collection Time: 02/02/21  5:06 PM  Result Value Ref Range   Lactic Acid, Venous 0.7 0.5 - 1.9 mmol/L    Comment: Performed at [facility], [address] [place], [place], [place] [address]  CBC     Status: Abnormal   Collection Time: 02/03/21  5:26 AM  Result Value Ref Range   WBC 14.3 (H) 4.0 - 10.5 K/uL   RBC 3.80 (L) 3.87 - 5.11 MIL/uL   Hemoglobin 12.1 12.0 - 15.0 g/dL   HCT 03.5 (L) 00.9 - 38.1 %   MCV 94.5 80.0 - 100.0 fL   MCH 31.8 26.0 - 34.0 pg   MCHC 33.7 30.0 - 36.0 g/dL   RDW 82.9 93.7 - 16.9 %   Platelets 283 150 - 400 K/uL   nRBC 0.0 0.0 - 0.2 %    Comment: Performed at [facility], [address] [place], [place] [address]   CT CHEST W CONTRAST  Result Date: 02/02/2021 CLINICAL DATA:  Chest wall pain, LEFT breast swelling and pain, history of GI bleed. EXAM: CT CHEST WITH CONTRAST TECHNIQUE: Multidetector CT imaging of the chest was performed during intravenous contrast administration. CONTRAST:  31mL OMNIPAQUE IOHEXOL 300 MG/ML  SOLN COMPARISON:  Comparison made with abdominal imaging from January 29, 2021. FINDINGS:  Cardiovascular: Heart size normal without pericardial effusion. Normal caliber of the thoracic aorta. Central pulmonary vasculature is normal caliber. Mediastinum/Nodes: Mildly enlarged LEFT axillary lymph nodes in the range of 9 mm to 1 cm short axis. Many of these retain fatty hila. No thoracic inlet adenopathy. No internal mammary adenopathy. No mediastinal or hilar lymphadenopathy. Esophagus grossly normal. Lungs/Pleura: Basilar atelectasis. No effusion. No consolidative changes. Airways are patent. Upper Abdomen: Incidental imaging of upper abdominal contents with hepatic steatosis. Imaged portions of upper abdominal viscera otherwise unremarkable on very limited assessment with limited coverage. Musculoskeletal: Marked skin thickening and stranding within the subcutaneous fat of the LEFT breast. Asymmetry in general of LEFT breast with increased density in the area of glandular tissue throughout the LEFT breast compared to the RIGHT breast. No focal fluid collection. The inferior aspect of the LEFT breast could be seen on prior imaging from July and did not appear to show these changes. The breast is incompletely evaluated however on CT and was not imaged in its entirety on prior CT of the abdomen. IMPRESSION: 1. Marked skin thickening about the LEFT breast with marked stranding throughout the LEFT breast in the subcutaneous fat. Findings suggest cellulitis/marked mastitis without fluid collection. This would be difficult to differentiate from inflammatory breast neoplasm. Correlate with physical exam and breast imaging along with any signs of infection clinically. 2. Mildly enlarged LEFT axillary lymph nodes in the range of 9  mm to 1 cm, potentially reactive, though nonspecific in the current context. 3. Hepatic steatosis Electronically Signed   By: Donzetta Kohut M.D.   On: 02/02/2021 16:00   DG ABD ACUTE 2+V W 1V CHEST  Result Date: 02/02/2021 CLINICAL DATA:  Constipation. EXAM: DG ABDOMEN ACUTE WITH 1  VIEW CHEST COMPARISON:  01/29/2021 FINDINGS: Heart size and mediastinal contours are unremarkable. No pleural effusion or edema. No airspace densities. No dilated small bowel loops. Moderate stool burden identified within the colon up to the level of the rectum. IMPRESSION: Moderate stool burden within the colon compatible with constipation. Electronically Signed   By: Signa Kell M.D.   On: 02/02/2021 14:53      Assessment/Plan Left breast mastitis The patient appears to have mastitis of unclear etiology.  She rarely smokes a black and mild in stressful situations but otherwise does not smoke.  She has had no trauma, bite, or otherwise to this breast.  She does not have evidence of an abscess that will require drainage/aspiration.  For now, medical management with IV abx is recommended.  She is on Ancef currently.  We likely need to broaden that, but will d/w MD prior to changing.  We will continue to follow with you at this time.  FEN - regular VTE - SCDs, ok for chemical prophylaxis from our standpoint ID - Ancef  Letha Cape, [facility] 02/03/2021, 12:59 PM Please see Amion for pager number during day hours 7:00am-4:30pm or 7:00am -11:30am on weekends

## 2021-02-04 DIAGNOSIS — N61 Mastitis without abscess: Secondary | ICD-10-CM | POA: Diagnosis not present

## 2021-02-04 LAB — CBC WITH DIFFERENTIAL/PLATELET
Abs Immature Granulocytes: 0.03 10*3/uL (ref 0.00–0.07)
Basophils Absolute: 0.1 10*3/uL (ref 0.0–0.1)
Basophils Relative: 1 %
Eosinophils Absolute: 0.2 10*3/uL (ref 0.0–0.5)
Eosinophils Relative: 2 %
HCT: 36.1 % (ref 36.0–46.0)
Hemoglobin: 12.3 g/dL (ref 12.0–15.0)
Immature Granulocytes: 0 %
Lymphocytes Relative: 19 %
Lymphs Abs: 1.9 10*3/uL (ref 0.7–4.0)
MCH: 31.9 pg (ref 26.0–34.0)
MCHC: 34.1 g/dL (ref 30.0–36.0)
MCV: 93.5 fL (ref 80.0–100.0)
Monocytes Absolute: 0.8 10*3/uL (ref 0.1–1.0)
Monocytes Relative: 8 %
Neutro Abs: 7.1 10*3/uL (ref 1.7–7.7)
Neutrophils Relative %: 70 %
Platelets: 287 10*3/uL (ref 150–400)
RBC: 3.86 MIL/uL — ABNORMAL LOW (ref 3.87–5.11)
RDW: 13.6 % (ref 11.5–15.5)
WBC: 10.1 10*3/uL (ref 4.0–10.5)
nRBC: 0 % (ref 0.0–0.2)

## 2021-02-04 MED ORDER — ZOLPIDEM TARTRATE 5 MG PO TABS
5.0000 mg | ORAL_TABLET | Freq: Once | ORAL | Status: AC
  Administered 2021-02-04: 5 mg via ORAL
  Filled 2021-02-04: qty 1

## 2021-02-04 MED ORDER — DIPHENHYDRAMINE HCL 50 MG/ML IJ SOLN
25.0000 mg | Freq: Two times a day (BID) | INTRAMUSCULAR | Status: DC | PRN
  Administered 2021-02-04 (×2): 25 mg via INTRAVENOUS
  Filled 2021-02-04 (×2): qty 1

## 2021-02-04 MED ORDER — LACTULOSE 10 GM/15ML PO SOLN: 20.0000 g | Freq: Two times a day (BID) | ORAL | Status: DC | PRN

## 2021-02-04 NOTE — Progress Notes (Signed)
Subjective/Chief Complaint: LEFT BREAST PAIN Pt still has painful swollen left breast  U/S done but not read I reviewed - edema , cannot discern large abscess   Objective: Vital signs in last 24 hours: Temp:  [97.9 F (36.6 C)-100.8 F (38.2 C)] 98.6 F (37 C) (08/06 0509) Pulse Rate:  [85-94] 85 (08/06 0509) Resp:  [16-18] 16 (08/06 0509) BP: (129-139)/(84-88) 129/86 (08/06 0509) SpO2:  [100 %] 100 % (08/06 0509) Last BM Date: 01/29/21  Intake/Output from previous day: 08/05 0701 - 08/06 0700 In: 660 [P.O.:360; IV Piggyback:300] Out: -  Intake/Output this shift: No intake/output data recorded.  General appearance: alert and cooperative Breasts: positive findings: edema of skin on the left lower outer quadrant and edema erythema involving lower qudrants  no palpable fluctuance or drainage  Neurologic: Grossly normal  Lab Results:  Recent Labs    02/03/21 0526 02/04/21 0730  WBC 14.3* 10.1  HGB 12.1 12.3  HCT 35.9* 36.1  PLT 283 287   BMET Recent Labs    02/02/21 1352  NA 137  K 3.6  CL 104  CO2 22  GLUCOSE 99  BUN 6  CREATININE 0.76  CALCIUM 9.5   PT/INR No results for input(s): LABPROT, INR in the last 72 hours. ABG No results for input(s): PHART, HCO3 in the last 72 hours.  Invalid input(s): PCO2, PO2  Studies/Results: CT CHEST W CONTRAST  Result Date: 02/02/2021 CLINICAL DATA:  Chest wall pain, LEFT breast swelling and pain, history of GI bleed. EXAM: CT CHEST WITH CONTRAST TECHNIQUE: Multidetector CT imaging of the chest was performed during intravenous contrast administration. CONTRAST:  64mL OMNIPAQUE IOHEXOL 300 MG/ML  SOLN COMPARISON:  Comparison made with abdominal imaging from January 29, 2021. FINDINGS: Cardiovascular: Heart size normal without pericardial effusion. Normal caliber of the thoracic aorta. Central pulmonary vasculature is normal caliber. Mediastinum/Nodes: Mildly enlarged LEFT axillary lymph nodes in the range of 9 mm to 1 cm  short axis. Many of these retain fatty hila. No thoracic inlet adenopathy. No internal mammary adenopathy. No mediastinal or hilar lymphadenopathy. Esophagus grossly normal. Lungs/Pleura: Basilar atelectasis. No effusion. No consolidative changes. Airways are patent. Upper Abdomen: Incidental imaging of upper abdominal contents with hepatic steatosis. Imaged portions of upper abdominal viscera otherwise unremarkable on very limited assessment with limited coverage. Musculoskeletal: Marked skin thickening and stranding within the subcutaneous fat of the LEFT breast. Asymmetry in general of LEFT breast with increased density in the area of glandular tissue throughout the LEFT breast compared to the RIGHT breast. No focal fluid collection. The inferior aspect of the LEFT breast could be seen on prior imaging from July and did not appear to show these changes. The breast is incompletely evaluated however on CT and was not imaged in its entirety on prior CT of the abdomen. IMPRESSION: 1. Marked skin thickening about the LEFT breast with marked stranding throughout the LEFT breast in the subcutaneous fat. Findings suggest cellulitis/marked mastitis without fluid collection. This would be difficult to differentiate from inflammatory breast neoplasm. Correlate with physical exam and breast imaging along with any signs of infection clinically. 2. Mildly enlarged LEFT axillary lymph nodes in the range of 9 mm to 1 cm, potentially reactive, though nonspecific in the current context. 3. Hepatic steatosis Electronically Signed   By: Donzetta Kohut M.D.   On: 02/02/2021 16:00   DG ABD ACUTE 2+V W 1V CHEST  Result Date: 02/02/2021 CLINICAL DATA:  Constipation. EXAM: DG ABDOMEN ACUTE WITH 1 VIEW CHEST COMPARISON:  01/29/2021 FINDINGS: Heart size and mediastinal contours are unremarkable. No pleural effusion or edema. No airspace densities. No dilated small bowel loops. Moderate stool burden identified within the colon up to the  level of the rectum. IMPRESSION: Moderate stool burden within the colon compatible with constipation. Electronically Signed   By: Signa Kell M.D.   On: 02/02/2021 14:53    Anti-infectives: Anti-infectives (From admission, onward)    Start     Dose/Rate Route Frequency Ordered Stop   02/03/21 2200  vancomycin (VANCOCIN) IVPB 1000 mg/200 mL premix        1,000 mg 200 mL/hr over 60 Minutes Intravenous Every 12 hours 02/03/21 1412     02/03/21 1515  piperacillin-tazobactam (ZOSYN) IVPB 3.375 g        3.375 g 12.5 mL/hr over 240 Minutes Intravenous Every 8 hours 02/03/21 1417     02/02/21 2200  ceFAZolin (ANCEF) IVPB 1 g/50 mL premix  Status:  Discontinued        1 g 100 mL/hr over 30 Minutes Intravenous Every 8 hours 02/02/21 2011 02/03/21 1412   02/02/21 1645  vancomycin (VANCOCIN) IVPB 1000 mg/200 mL premix        1,000 mg 200 mL/hr over 60 Minutes Intravenous  Once 02/02/21 1635 02/02/21 1836   02/02/21 1645  ceFEPIme (MAXIPIME) 2 g in sodium chloride 0.9 % 100 mL IVPB        2 g 200 mL/hr over 30 Minutes Intravenous  Once 02/02/21 1635 02/02/21 1735       Assessment/Plan: Left breast mastitis  Continue ABX  No abscess on CT  and U/S but report pending on this  No acute surgical need for today so continue diet  Less likely inflammatory breast cancer given presentation    LOS: 1 day    Maisie Fus A Timisha Mondry 02/04/2021

## 2021-02-04 NOTE — Progress Notes (Signed)
PROGRESS NOTE    [name]  SKA:[id number] DOB: 1980-09-09 DOA: 02/02/2021 PCP: Pcp, No    Brief Narrative:  40 year old with history of asthma, migraine, recent admission for esophagitis gastritis admitted with 2 days of severe pain and swelling left inferior aspect of the breast.  Reported negative mammogram 3 months ago.  No injury, not currently lactating or breast-feeding.   Assessment & Plan:   Principal Problem:   Cellulitis of left breast Active Problems:   Asthma, mild intermittent, well-controlled   Abscess of breast, left  Left breast cellulitis: Significant swelling and induration with no evidence of underlying abscess.  Due to significant symptoms, treated with IV antibiotics.  Adequate oral and IV pain opiates for pain relief. Recent gastritis and upper GI bleeding, avoiding NSAIDs. Followed by surgery.  Anticipating conservative management.  Currently remains on vancomycin and Zosyn.  GERD/gastritis: Symptoms improved.  Currently on Protonix 40 mg twice daily.  Asthma: Mild intermittent asthma.  Stable.  Albuterol as needed.  Constipation: Some response to fleets enema.  We will keep on a scheduled MiraLAX and lactulose.   DVT prophylaxis: SCDs Start: 02/02/21 2046   Code Status: Full code Family Communication: Husband at the bedside Disposition Plan: Status is: Inpatient  Dispo: The patient is from: Home              Anticipated d/c is to: Home              Patient currently is not medically stable to d/c.   Difficult to place patient No         Consultants:  General surgery  Procedures:  None  Antimicrobials:  Vancomycin and cefazolin 8/4. Cefazolin 8/4----   Subjective: Patient seen and examined.  Pain remains the issue.  Feels that left breast is more tighter.  She had itching with vancomycin.  Objective: Vitals:   02/03/21 1526 02/03/21 2048 02/04/21 0509 02/04/21 1326  BP: 137/88 139/84 129/86 (!) 127/91  Pulse: 90 94 85 92   Resp: 16 18 16 18   Temp: 97.9 F (36.6 C) (!) 100.8 F (38.2 C) 98.6 F (37 C) 98.1 F (36.7 C)  TempSrc: Oral Oral Oral Oral  SpO2: 100% 100% 100%   Weight:      Height:        Intake/Output Summary (Last 24 hours) at 02/04/2021 1537 Last data filed at 02/04/2021 1030 Gross per 24 hour  Intake 420 ml  Output --  Net 420 ml   Filed Weights   02/02/21 1342 02/02/21 1941  Weight: 97.5 kg 102.2 kg    Examination:  General: Looks fairly comfortable at rest.  Not in any distress. Cardiovascular: S1-S2 normal.  No added sounds. Respiratory: Bilateral clear. Gastrointestinal: Soft and nontender.  Bowel sounds present. Ext: No edema or cyanosis. Neuro: A&O x4.  No focal deficit. Left breast with swelling tenderness mostly on the inferior aspect.  No fluctuation.    Data Reviewed: I have personally reviewed following labs and imaging studies  CBC: Recent Labs  Lab 01/29/21 1223 01/30/21 0039 02/02/21 1352 02/03/21 0526 02/04/21 0730  WBC 8.2 7.9 13.4* 14.3* 10.1  NEUTROABS 4.7  --  10.5*  --  7.1  HGB 13.9 12.2 13.0 12.1 12.3  HCT 41.1 37.0 37.8 35.9* 36.1  MCV 93.2 94.1 92.0 94.5 93.5  PLT 385 310 304 283 287   Basic Metabolic Panel: Recent Labs  Lab 01/29/21 1223 01/30/21 0039 02/02/21 1352  NA 139 139 137  K 4.0 3.5  3.6  CL 109 109 104  CO2 21* 23 22  GLUCOSE 102* 91 99  BUN 7 8 6   CREATININE 0.83 0.92 0.76  CALCIUM 9.6 8.8* 9.5   GFR: Estimated Creatinine Clearance: 108.6 mL/min (by C-G formula based on SCr of 0.76 mg/dL). Liver Function Tests: Recent Labs  Lab 01/29/21 1223 01/30/21 0039 02/02/21 1352  AST 16 15 13*  ALT 16 13 14   ALKPHOS 53 50 54  BILITOT 0.1* 0.4 0.7  PROT 7.4 6.6 7.4  ALBUMIN 4.0 3.5 4.2   Recent Labs  Lab 01/29/21 1223  LIPASE 24   No results for input(s): AMMONIA in the last 168 hours. Coagulation Profile: Recent Labs  Lab 01/30/21 0039  INR 1.1   Cardiac Enzymes: No results for input(s): CKTOTAL, CKMB,  CKMBINDEX, TROPONINI in the last 168 hours. BNP (last 3 results) No results for input(s): PROBNP in the last 8760 hours. HbA1C: No results for input(s): HGBA1C in the last 72 hours. CBG: Recent Labs  Lab 01/30/21 0727  GLUCAP 92   Lipid Profile: No results for input(s): CHOL, HDL, LDLCALC, TRIG, CHOLHDL, LDLDIRECT in the last 72 hours. Thyroid Function Tests: No results for input(s): TSH, T4TOTAL, FREET4, T3FREE, THYROIDAB in the last 72 hours. Anemia Panel: No results for input(s): VITAMINB12, FOLATE, FERRITIN, TIBC, IRON, RETICCTPCT in the last 72 hours. Sepsis Labs: Recent Labs  Lab 02/02/21 1706  LATICACIDVEN 0.7    Recent Results (from the past 240 hour(s))  Resp Panel by RT-PCR (Flu A&B, Covid) Nasopharyngeal Swab     Status: None   Collection Time: 01/29/21  8:32 PM   Specimen: Nasopharyngeal Swab; Nasopharyngeal(NP) swabs in vial transport medium  Result Value Ref Range Status   SARS Coronavirus 2 by RT PCR NEGATIVE NEGATIVE Final    Comment: (NOTE) SARS-CoV-2 target nucleic acids are NOT DETECTED.  The SARS-CoV-2 RNA is generally detectable in upper respiratory specimens during the acute phase of infection. The lowest concentration of SARS-CoV-2 viral copies this assay can detect is 138 copies/mL. A negative result does not preclude SARS-Cov-2 infection and should not be used as the sole basis for treatment or other patient management decisions. A negative result may occur with  improper specimen collection/handling, submission of specimen other than nasopharyngeal swab, presence of viral mutation(s) within the areas targeted by this assay, and inadequate number of viral copies(<138 copies/mL). A negative result must be combined with clinical observations, patient history, and epidemiological information. The expected result is Negative.  Fact Sheet for Patients:  04/04/21  Fact Sheet for Healthcare Providers:   01/31/21  This test is no t yet approved or cleared by the [employer] FDA and  has been authorized for detection and/or diagnosis of SARS-CoV-2 by FDA under an Emergency Use Authorization (EUA). This EUA will remain  in effect (meaning this test can be used) for the duration of the COVID-19 declaration under Section 564(b)(1) of the Act, 21 U.S.C.section 360bbb-3(b)(1), unless the authorization is terminated  or revoked sooner.       Influenza A by PCR NEGATIVE NEGATIVE Final   Influenza B by PCR NEGATIVE NEGATIVE Final    Comment: (NOTE) The Xpert Xpress SARS-CoV-2/FLU/RSV plus assay is intended as an aid in the diagnosis of influenza from Nasopharyngeal swab specimens and should not be used as a sole basis for treatment. Nasal washings and aspirates are unacceptable for Xpert Xpress SARS-CoV-2/FLU/RSV testing.  Fact Sheet for Patients: SeriousBroker.it  Fact Sheet for Healthcare Providers: [place]  This test is not  yet approved or cleared by the [place] FDA and has been authorized for detection and/or diagnosis of SARS-CoV-2 by FDA under an Emergency Use Authorization (EUA). This EUA will remain in effect (meaning this test can be used) for the duration of the COVID-19 declaration under Section 564(b)(1) of the Act, 21 U.S.C. section 360bbb-3(b)(1), unless the authorization is terminated or revoked.  Performed at [facility], [address] [place], [place] [address]   Blood culture (routine x 2)     Status: None (Preliminary result)   Collection Time: 02/02/21  4:53 PM   Specimen: BLOOD  Result Value Ref Range Status   Specimen Description   Final    BLOOD RIGHT ANTECUBITAL Performed at [facility], [address], [place], [place] [address]    Special Requests   Final    Blood Culture adequate volume BOTTLES DRAWN AEROBIC AND  ANAEROBIC Performed at [facility], [address], [place], [place] [address]    Culture   Final    NO GROWTH 2 DAYS Performed at [facility], [address] [place], [place] [address]    Report Status PENDING  Incomplete  Blood culture (routine x 2)     Status: None (Preliminary result)   Collection Time: 02/02/21  4:55 PM   Specimen: BLOOD  Result Value Ref Range Status   Specimen Description   Final    BLOOD BLOOD LEFT WRIST BLOOD RIGHT WRIST Performed at [facility], [address], [place], [place] [address]    Special Requests   Final    Blood Culture adequate volume BOTTLES DRAWN AEROBIC AND ANAEROBIC Performed at [facility], [address], [place], [place] [address]    Culture   Final    NO GROWTH 2 DAYS Performed at [facility], [address] [place], [place] [address]    Report Status PENDING  Incomplete  Resp Panel by RT-PCR (Flu A&B, Covid) Nasopharyngeal Swab     Status: None   Collection Time: 02/02/21  5:06 PM   Specimen: Nasopharyngeal Swab; Nasopharyngeal(NP) swabs in vial transport medium  Result Value Ref Range Status   SARS Coronavirus 2 by RT PCR NEGATIVE NEGATIVE Final    Comment: (NOTE) SARS-CoV-2 target nucleic acids are NOT DETECTED.  The SARS-CoV-2 RNA is generally detectable in upper respiratory specimens during the acute phase of infection. The lowest concentration of SARS-CoV-2 viral copies this assay can detect is 138 copies/mL. A negative result does not preclude SARS-Cov-2 infection and should not be used as the sole basis for treatment or other patient management decisions. A negative result may occur with  improper specimen collection/handling, submission of specimen other than nasopharyngeal swab, presence of viral mutation(s) within the areas targeted by this assay, and inadequate number of viral copies(<138 copies/mL). A negative result must be combined  with clinical observations, patient history, and epidemiological information. The expected result is Negative.  Fact Sheet for Patients:  BloggerCourse.comhttps://www.fda.gov/media/152166/download  Fact Sheet for Healthcare Providers:  SeriousBroker.ithttps://www.fda.gov/media/152162/download  This test is no t yet approved or cleared by the [place] FDA and  has been authorized for detection and/or diagnosis of SARS-CoV-2 by FDA under an Emergency Use Authorization (EUA). This EUA will remain  in effect (meaning this test can be used) for the duration of the COVID-19 declaration under Section 564(b)(1) of the Act, 21 U.S.C.section 360bbb-3(b)(1), unless the authorization is terminated  or revoked sooner.       Influenza A  by PCR NEGATIVE NEGATIVE Final   Influenza B by PCR NEGATIVE NEGATIVE Final    Comment: (NOTE) The Xpert Xpress SARS-CoV-2/FLU/RSV plus assay is intended as an aid in the diagnosis of influenza from Nasopharyngeal swab specimens and should not be used as a sole basis for treatment. Nasal washings and aspirates are unacceptable for Xpert Xpress SARS-CoV-2/FLU/RSV testing.  Fact Sheet for Patients: BloggerCourse.com  Fact Sheet for Healthcare Providers: SeriousBroker.it  This test is not yet approved or cleared by the [place] FDA and has been authorized for detection and/or diagnosis of SARS-CoV-2 by FDA under an Emergency Use Authorization (EUA). This EUA will remain in effect (meaning this test can be used) for the duration of the COVID-19 declaration under Section 564(b)(1) of the Act, 21 U.S.C. section 360bbb-3(b)(1), unless the authorization is terminated or revoked.  Performed at [facility], [address] [place], [place] [address]          Radiology Studies: No results found.      Scheduled Meds:  pantoprazole  40 mg Oral BID   polyethylene glycol  17 g Oral Daily   Continuous  Infusions:  piperacillin-tazobactam (ZOSYN)  IV 3.375 g (02/04/21 1331)   vancomycin 1,000 mg (02/04/21 0915)     LOS: 1 day    Time spent: 30 minutes    Dorcas Carrow, MD [facility] Pager [phone]

## 2021-02-04 NOTE — Progress Notes (Addendum)
Pt reports mild itching on her body after 2nd dose of vancomycin. Notified to MD. Jovita Gamma benadryl. According to pt she had itching with vancomycin  at another hospital. Md said can give it slow infusion of vancomycin. Will continue monitor the pt.

## 2021-02-05 DIAGNOSIS — N61 Mastitis without abscess: Secondary | ICD-10-CM | POA: Diagnosis not present

## 2021-02-05 LAB — CREATININE, SERUM
Creatinine, Ser: 1.01 mg/dL — ABNORMAL HIGH (ref 0.44–1.00)
GFR, Estimated: >60 mL/min (ref >60)

## 2021-02-05 MED ORDER — OXYCODONE HCL 10 MG PO TABS: 10.0000 mg | ORAL_TABLET | ORAL | 0 refills | Status: AC | PRN

## 2021-02-05 MED ORDER — POLYETHYLENE GLYCOL 3350 17 G PO PACK: 17.0000 g | PACK | Freq: Every day | ORAL | 0 refills | Status: DC

## 2021-02-05 MED ORDER — VANCOMYCIN HCL 750 MG/150ML IV SOLN
750.0000 mg | Freq: Two times a day (BID) | INTRAVENOUS | Status: DC
  Filled 2021-02-05: qty 150

## 2021-02-05 MED ORDER — ALBUTEROL SULFATE HFA 108 (90 BASE) MCG/ACT IN AERS: 2.0000 | INHALATION_SPRAY | Freq: Four times a day (QID) | RESPIRATORY_TRACT | 1 refills | Status: DC | PRN

## 2021-02-05 MED ORDER — ACETAMINOPHEN 500 MG PO TABS: 1000.0000 mg | ORAL_TABLET | Freq: Four times a day (QID) | ORAL | Status: DC | PRN

## 2021-02-05 MED ORDER — DOXYCYCLINE MONOHYDRATE 100 MG PO TABS: 100.0000 mg | ORAL_TABLET | Freq: Two times a day (BID) | ORAL | 0 refills | Status: AC

## 2021-02-05 NOTE — Progress Notes (Addendum)
Secure chatted Dr. Fredrich Romans about PICC line benefits for this patient.  He will discuss with team later.

## 2021-02-05 NOTE — Progress Notes (Signed)
Pharmacy Antibiotic Note  [name] is a 40 y.o. female admitted on 02/02/2021 with mastitis of L breast, no abscess.  Pharmacy has been consulted for Vancomycin dosing.  SCr 0.76 (8/4) >> 1.01 (8/7) 1 urine occurrence documented 8/6 Patient developed itching with vancomycin infusion - benadryl ordered and MD ordered to slow infusion. WBC improved 10.1 Tmax 100.8 (8/5) >> 99 (8/6), now afebrile  Plan: Decrease Vancomycin to 750mg  q12 with rising SCr, for AUC 501, SCr Vd 0.5 Zosyn per MD Follow up plans for abx duration  Height: 5\' 5"  (165.1 cm) Weight: 102.2 kg (225 lb 5 oz) IBW/kg (Calculated) : 57  Temp (24hrs), Avg:98.4 F (36.9 C), Min:98.1 F (36.7 C), Max:99 F (37.2 C)  Recent Labs  Lab 01/29/21 1223 01/30/21 0039 02/02/21 1352 02/02/21 1706 02/03/21 0526 02/04/21 0730 02/05/21 0417  WBC 8.2 7.9 13.4*  --  14.3* 10.1  --   CREATININE 0.83 0.92 0.76  --   --   --  1.01*  LATICACIDVEN  --   --   --  0.7  --   --   --      Estimated Creatinine Clearance: 86 mL/min (A) (by C-G formula based on SCr of 1.01 mg/dL (H)).    Allergies  Allergen Reactions   Ibuprofen Other (See Comments)    Ulcer flare up    Tramadol Hives   Antimicrobials this admission: 8/4  Vanc/Cefepime x1 8/4 Cefazolin >> 8/5 8/5 Vancomycin >> 8/5 Zosyn >>  Dose adjustments this admission:  Microbiology results: 8/4 BCx:  Thank you for allowing pharmacy to be a part of this patient's care.  10/5, PharmD, BCPS Pharmacy: [phone] 02/05/2021 7:31 AM

## 2021-02-05 NOTE — Progress Notes (Signed)
   Subjective/Chief Complaint:left breast pain Pain seems better  U/S negative for abscess    Objective: Vital signs in last 24 hours: Temp:  [98.1 F (36.7 C)-99 F (37.2 C)] 98.2 F (36.8 C) (08/07 0538) Pulse Rate:  [82-92] 82 (08/07 0538) Resp:  [18-21] 19 (08/07 0538) BP: (127-131)/(78-91) 130/85 (08/07 0538) SpO2:  [99 %-100 %] 100 % (08/07 0538) Last BM Date: 02/04/21  Intake/Output from previous day: 08/06 0701 - 08/07 0700 In: 360 [P.O.:360] Out: -  Intake/Output this shift: No intake/output data recorded.  General appearance: alert and cooperative Breasts: erythema left breast lower quadrants less no mass no drainage or discharge  Neurologic: Grossly normal  Lab Results:  Recent Labs    02/03/21 0526 02/04/21 0730  WBC 14.3* 10.1  HGB 12.1 12.3  HCT 35.9* 36.1  PLT 283 287   BMET Recent Labs    02/02/21 1352 02/05/21 0417  NA 137  --   K 3.6  --   CL 104  --   CO2 22  --   GLUCOSE 99  --   BUN 6  --   CREATININE 0.76 1.01*  CALCIUM 9.5  --    PT/INR No results for input(s): LABPROT, INR in the last 72 hours. ABG No results for input(s): PHART, HCO3 in the last 72 hours.  Invalid input(s): PCO2, PO2  Studies/Results: No results found.  Anti-infectives: Anti-infectives (From admission, onward)    Start     Dose/Rate Route Frequency Ordered Stop   02/05/21 1200  vancomycin (VANCOREADY) IVPB 750 mg/150 mL        750 mg 75 mL/hr over 120 Minutes Intravenous Every 12 hours 02/05/21 0728     02/03/21 2200  vancomycin (VANCOCIN) IVPB 1000 mg/200 mL premix  Status:  Discontinued        1,000 mg 200 mL/hr over 60 Minutes Intravenous Every 12 hours 02/03/21 1412 02/05/21 0728   02/03/21 1515  piperacillin-tazobactam (ZOSYN) IVPB 3.375 g        3.375 g 12.5 mL/hr over 240 Minutes Intravenous Every 8 hours 02/03/21 1417     02/02/21 2200  ceFAZolin (ANCEF) IVPB 1 g/50 mL premix  Status:  Discontinued        1 g 100 mL/hr over 30 Minutes  Intravenous Every 8 hours 02/02/21 2011 02/03/21 1412   02/02/21 1645  vancomycin (VANCOCIN) IVPB 1000 mg/200 mL premix        1,000 mg 200 mL/hr over 60 Minutes Intravenous  Once 02/02/21 1635 02/02/21 1836   02/02/21 1645  ceFEPIme (MAXIPIME) 2 g in sodium chloride 0.9 % 100 mL IVPB        2 g 200 mL/hr over 30 Minutes Intravenous  Once 02/02/21 1635 02/02/21 1735       Assessment/Plan:  Left breast mastitis   Continue ABX No abscess on CT  or U/S No acute surgical need for today so continue diet Less likely inflammatory breast cancer given presentation  WBC improved   Possible home Monday Tuesday depending on condition Recommend doxycycline for 8 more days after D/C    LOS: 2 days    Dortha Schwalbe MD  02/05/2021

## 2021-02-05 NOTE — Plan of Care (Signed)
  Problem: Education: Goal: Knowledge of General Education information will improve Description: Including pain rating scale, medication(s)/side effects and non-pharmacologic comfort measures 02/05/2021 1039 by Kenedi Cilia, Tarri Abernethy, RN Outcome: Adequate for Discharge 02/05/2021 [phone] by Delorse Limber, RN Outcome: Progressing   Problem: Health Behavior/Discharge Planning: Goal: Ability to manage health-related needs will improve 02/05/2021 1039 by Imanuel Pruiett, Tarri Abernethy, RN Outcome: Adequate for Discharge 02/05/2021 0927 by Delorse Limber, RN Outcome: Progressing   Problem: Clinical Measurements: Goal: Ability to maintain clinical measurements within normal limits will improve 02/05/2021 1039 by Ruberta Holck, Tarri Abernethy, RN Outcome: Adequate for Discharge 02/05/2021 [phone] by Delorse Limber, RN Outcome: Progressing Goal: Will remain free from infection 02/05/2021 1039 by Carlei Huang, Tarri Abernethy, RN Outcome: Adequate for Discharge 02/05/2021 [phone] by Delorse Limber, RN Outcome: Progressing Goal: Diagnostic test results will improve 02/05/2021 1039 by Sheetal Lyall, Tarri Abernethy, RN Outcome: Adequate for Discharge 02/05/2021 [phone] by Delorse Limber, RN Outcome: Progressing Goal: Respiratory complications will improve 02/05/2021 1039 by Delorse Limber, RN Outcome: Adequate for Discharge 02/05/2021 [phone] by Delorse Limber, RN Outcome: Progressing Goal: Cardiovascular complication will be avoided 02/05/2021 1039 by Delorse Limber, RN Outcome: Adequate for Discharge 02/05/2021 [phone] by Delorse Limber, RN Outcome: Progressing   Problem: Activity: Goal: Risk for activity intolerance will decrease 02/05/2021 1039 by Dayvian Blixt, Tarri Abernethy, RN Outcome: Adequate for Discharge 02/05/2021 [phone] by Delorse Limber, RN Outcome: Progressing   Problem: Elimination: Goal: Will not experience complications related to bowel motility 02/05/2021 1039 by Delorse Limber, RN Outcome: Adequate for Discharge 02/05/2021 [phone] by Delorse Limber,  RN Outcome: Progressing Goal: Will not experience complications related to urinary retention 02/05/2021 1039 by Kashten Gowin, Tarri Abernethy, RN Outcome: Adequate for Discharge 02/05/2021 0927 by Delorse Limber, RN Outcome: Progressing   Problem: Pain Managment: Goal: General experience of comfort will improve 02/05/2021 1039 by Delorse Limber, RN Outcome: Adequate for Discharge 02/05/2021 0927 by Delorse Limber, RN Outcome: Progressing

## 2021-02-05 NOTE — Discharge Summary (Signed)
Physician Discharge Summary  [name] UJW:[id number] DOB: [birth year] DOA: 02/02/2021  PCP: Oneita Hurt, No  Admit date: 02/02/2021 Discharge date: 02/05/2021  Admitted From: Home Disposition: Home  Recommendations for Outpatient Follow-up:  Follow up with PCP in 1-2 weeks Schedule follow-up with surgery  Home Health: Not applicable Equipment/Devices: Not applicable  Discharge Condition: Stable CODE STATUS: Full code Diet recommendation: Regular diet   Discharge summary: 40 year old with recent esophagitis gastritis on PPI presented with severe pain and swelling inferior aspect of left breast.  No injury.  No lactating.  She had severe pain and elevated white count resulting in admission to the hospital and treatment with broad-spectrum antibiotics for 72 hours and symptomatic treatment with medical pain medications.  CT scan on initial presentation without abscess.  Ultrasound on repeat exam without abscess. Blood cultures negative.  Followed by surgery.  Plan: Left breast cellulitis/mastitis with no abscess.  Broad-spectrum antibiotics for 3 days, no drainable abscess. Discharged home with doxycycline for 7 days. Pain is managed with oral pain medications, prescribed oxycodone for 5 days.  Local therapy including cold compress and breast support bra. May develop into abscess in the future, she will call and schedule follow-up with surgery office tomorrow. Other long-term medications remain the same.  Discharge Diagnoses:  Principal Problem:   Cellulitis of left breast Active Problems:   Asthma, mild intermittent, well-controlled   Abscess of breast, left    Discharge Instructions  Discharge Instructions     Call MD for:  redness, tenderness, or signs of infection (pain, swelling, redness, odor or green/yellow discharge around incision site)   Complete by: As directed    Call MD for:  severe uncontrolled pain   Complete by: As directed    Call MD for:  temperature >100.4    Complete by: As directed    Diet general   Complete by: As directed    Discharge instructions   Complete by: As directed    Use tylenol for mild pain, oxycodone for moderate pain. Use support bra to relieve pain and swelling   Increase activity slowly   Complete by: As directed       Allergies as of 02/05/2021       Reactions   Ibuprofen Other (See Comments)   Ulcer flare up   Tramadol Hives        Medication List     STOP taking these medications    Acetaminophen-Codeine 300-30 MG tablet       TAKE these medications    acetaminophen 500 MG tablet Commonly known as: TYLENOL Take 2 tablets (1,000 mg total) by mouth every 6 (six) hours as needed for mild pain, moderate pain or fever (or Fever >/= 101).   albuterol 108 (90 Base) MCG/ACT inhaler Commonly known as: VENTOLIN HFA Inhale 2 puffs into the lungs every 6 (six) hours as needed for wheezing or shortness of breath. What changed: how much to take   doxycycline 100 MG tablet Commonly known as: ADOXA Take 1 tablet (100 mg total) by mouth 2 (two) times daily for 7 days.   medroxyPROGESTERone 150 MG/ML injection Commonly known as: DEPO-PROVERA Inject 150 mg into the muscle every 3 (three) months.   Oxycodone HCl 10 MG Tabs Take 1 tablet (10 mg total) by mouth every 4 (four) hours as needed for up to 5 days for moderate pain or severe pain.   pantoprazole 40 MG tablet Commonly known as: Protonix Take 1 tablet (40 mg total) by mouth 2 (two) times  daily.   polyethylene glycol 17 g packet Commonly known as: MIRALAX / GLYCOLAX Take 17 g by mouth daily. Start taking on: February 06, 2021        Follow-up Information     [facility] Follow up.   Specialty: General Surgery Why: please call tomorrow 8/8 to schedule a follow up exam. Contact information: [address] [place] [address] [phone]               Allergies  Allergen Reactions    Ibuprofen Other (See Comments)    Ulcer flare up    Tramadol Hives    Consultations: General surgery   Procedures/Studies: DG Chest 2 View  Result Date: 01/29/2021 CLINICAL DATA:  Chest pain, nausea and vomiting. EXAM: CHEST - 2 VIEW COMPARISON:  Chest radiograph 07/18/2018 FINDINGS: The heart size and mediastinal contours are within normal limits. The lungs are clear. No pneumothorax or pleural effusion. No acute finding in the visualized skeleton. IMPRESSION: No active cardiopulmonary disease. Electronically Signed   By: Emmaline Kluver M.D.   On: 01/29/2021 16:22   CT CHEST W CONTRAST  Result Date: 02/02/2021 CLINICAL DATA:  Chest wall pain, LEFT breast swelling and pain, history of GI bleed. EXAM: CT CHEST WITH CONTRAST TECHNIQUE: Multidetector CT imaging of the chest was performed during intravenous contrast administration. CONTRAST:  2mL OMNIPAQUE IOHEXOL 300 MG/ML  SOLN COMPARISON:  Comparison made with abdominal imaging from January 29, 2021. FINDINGS: Cardiovascular: Heart size normal without pericardial effusion. Normal caliber of the thoracic aorta. Central pulmonary vasculature is normal caliber. Mediastinum/Nodes: Mildly enlarged LEFT axillary lymph nodes in the range of 9 mm to 1 cm short axis. Many of these retain fatty hila. No thoracic inlet adenopathy. No internal mammary adenopathy. No mediastinal or hilar lymphadenopathy. Esophagus grossly normal. Lungs/Pleura: Basilar atelectasis. No effusion. No consolidative changes. Airways are patent. Upper Abdomen: Incidental imaging of upper abdominal contents with hepatic steatosis. Imaged portions of upper abdominal viscera otherwise unremarkable on very limited assessment with limited coverage. Musculoskeletal: Marked skin thickening and stranding within the subcutaneous fat of the LEFT breast. Asymmetry in general of LEFT breast with increased density in the area of glandular tissue throughout the LEFT breast compared to the RIGHT  breast. No focal fluid collection. The inferior aspect of the LEFT breast could be seen on prior imaging from July and did not appear to show these changes. The breast is incompletely evaluated however on CT and was not imaged in its entirety on prior CT of the abdomen. IMPRESSION: 1. Marked skin thickening about the LEFT breast with marked stranding throughout the LEFT breast in the subcutaneous fat. Findings suggest cellulitis/marked mastitis without fluid collection. This would be difficult to differentiate from inflammatory breast neoplasm. Correlate with physical exam and breast imaging along with any signs of infection clinically. 2. Mildly enlarged LEFT axillary lymph nodes in the range of 9 mm to 1 cm, potentially reactive, though nonspecific in the current context. 3. Hepatic steatosis Electronically Signed   By: Donzetta Kohut M.D.   On: 02/02/2021 16:00   CT ABDOMEN PELVIS W CONTRAST  Result Date: 01/29/2021 CLINICAL DATA:  Abdominal pain. Hematemesis. EXAM: CT ABDOMEN AND PELVIS WITH CONTRAST TECHNIQUE: Multidetector CT imaging of the abdomen and pelvis was performed using the standard protocol following bolus administration of intravenous contrast. CONTRAST:  OMNIPAQUE IOHEXOL 350 MG/ML SOLN COMPARISON:  Noncontrast CT on 02/05/2012 FINDINGS: Lower Chest: No acute findings. Hepatobiliary: No hepatic masses identified.  Gallbladder is unremarkable. No evidence of biliary ductal dilatation. Pancreas:  No mass or inflammatory changes. Spleen: Within normal limits in size and appearance. Adrenals/Urinary Tract: No masses identified. No evidence of ureteral calculi or hydronephrosis. Stomach/Bowel: No evidence of obstruction, inflammatory process or abnormal fluid collections. Normal appendix visualized. Vascular/Lymphatic: No pathologically enlarged lymph nodes. No acute vascular findings. Reproductive:  No mass or other significant abnormality. Other: Small midline epigastric and umbilical ventral  hernias are again seen, both containing only fat. Musculoskeletal:  No suspicious bone lesions identified. IMPRESSION: No acute findings within the abdomen or pelvis. Stable small epigastric and umbilical ventral hernias, both containing only fat. Electronically Signed   By: Danae Orleans M.D.   On: 01/29/2021 18:04   DG ABD ACUTE 2+V W 1V CHEST  Result Date: 02/02/2021 CLINICAL DATA:  Constipation. EXAM: DG ABDOMEN ACUTE WITH 1 VIEW CHEST COMPARISON:  01/29/2021 FINDINGS: Heart size and mediastinal contours are unremarkable. No pleural effusion or edema. No airspace densities. No dilated small bowel loops. Moderate stool burden identified within the colon up to the level of the rectum. IMPRESSION: Moderate stool burden within the colon compatible with constipation. Electronically Signed   By: Signa Kell M.D.   On: 02/02/2021 14:53   (Echo, Carotid, EGD, Colonoscopy, ERCP)    Subjective: Patient seen and examined.  Afebrile overnight.  Pain remains moderate to severe on the left breast.  Swelling remains about the same.   Discharge Exam: Vitals:   02/04/21 2035 02/05/21 0538  BP: 131/78 130/85  Pulse: 87 82  Resp: (!) 21 19  Temp: 99 F (37.2 C) 98.2 F (36.8 C)  SpO2: 99% 100%   Vitals:   02/04/21 0509 02/04/21 1326 02/04/21 2035 02/05/21 0538  BP: 129/86 (!) 127/91 131/78 130/85  Pulse: 85 92 87 82  Resp: 16 18 (!) 21 19  Temp: 98.6 F (37 C) 98.1 F (36.7 C) 99 F (37.2 C) 98.2 F (36.8 C)  TempSrc: Oral Oral Oral Oral  SpO2: 100%  99% 100%  Weight:      Height:        General: Pt is alert, awake, not in acute distress Cardiovascular: RRR, S1/S2 +, no rubs, no gallops Respiratory: CTA bilaterally, no wheezing, no rhonchi Abdominal: Soft, NT, ND, bowel sounds + Extremities: no edema, no cyanosis Left breast with asymmetrical swelling, erythema and edema left inferior quadrant.  No drainage.  No fluctuation.    The results of significant diagnostics from this  hospitalization (including imaging, microbiology, ancillary and laboratory) are listed below for reference.     Microbiology: Recent Results (from the past 240 hour(s))  Resp Panel by RT-PCR (Flu A&B, Covid) Nasopharyngeal Swab     Status: None   Collection Time: 01/29/21  8:32 PM   Specimen: Nasopharyngeal Swab; Nasopharyngeal(NP) swabs in vial transport medium  Result Value Ref Range Status   SARS Coronavirus 2 by RT PCR NEGATIVE NEGATIVE Final    Comment: (NOTE) SARS-CoV-2 target nucleic acids are NOT DETECTED.  The SARS-CoV-2 RNA is generally detectable in upper respiratory specimens during the acute phase of infection. The lowest concentration of SARS-CoV-2 viral copies this assay can detect is 138 copies/mL. A negative result does not preclude SARS-Cov-2 infection and should not be used as the sole basis for treatment or other patient management decisions. A negative result may occur with  improper specimen collection/handling, submission of specimen other than nasopharyngeal swab, presence of viral mutation(s) within the areas targeted by this assay, and inadequate number  of viral copies(<138 copies/mL). A negative result must be combined with clinical observations, patient history, and epidemiological information. The expected result is Negative.  Fact Sheet for Patients:  BloggerCourse.com  Fact Sheet for Healthcare Providers:  SeriousBroker.it  This test is no t yet approved or cleared by the [place] FDA and  has been authorized for detection and/or diagnosis of SARS-CoV-2 by FDA under an Emergency Use Authorization (EUA). This EUA will remain  in effect (meaning this test can be used) for the duration of the COVID-19 declaration under Section 564(b)(1) of the Act, 21 U.S.C.section 360bbb-3(b)(1), unless the authorization is terminated  or revoked sooner.       Influenza A by PCR NEGATIVE NEGATIVE Final    Influenza B by PCR NEGATIVE NEGATIVE Final    Comment: (NOTE) The Xpert Xpress SARS-CoV-2/FLU/RSV plus assay is intended as an aid in the diagnosis of influenza from Nasopharyngeal swab specimens and should not be used as a sole basis for treatment. Nasal washings and aspirates are unacceptable for Xpert Xpress SARS-CoV-2/FLU/RSV testing.  Fact Sheet for Patients: BloggerCourse.com  Fact Sheet for Healthcare Providers: SeriousBroker.it  This test is not yet approved or cleared by the [place] FDA and has been authorized for detection and/or diagnosis of SARS-CoV-2 by FDA under an Emergency Use Authorization (EUA). This EUA will remain in effect (meaning this test can be used) for the duration of the COVID-19 declaration under Section 564(b)(1) of the Act, 21 U.S.C. section 360bbb-3(b)(1), unless the authorization is terminated or revoked.  Performed at [facility], [address] [place], [place] [address]   Blood culture (routine x 2)     Status: None (Preliminary result)   Collection Time: 02/02/21  4:53 PM   Specimen: BLOOD  Result Value Ref Range Status   Specimen Description   Final    BLOOD RIGHT ANTECUBITAL Performed at [facility], [address] [place], [place] [address]    Special Requests   Final    Blood Culture adequate volume BOTTLES DRAWN AEROBIC AND ANAEROBIC Performed at [facility], [address] [place], [place] [address]    Culture   Final    NO GROWTH 3 DAYS Performed at [facility], [address] [place], [place] [address]    Report Status PENDING  Incomplete  Blood culture (routine x 2)     Status: None (Preliminary result)   Collection Time: 02/02/21  4:55 PM   Specimen: BLOOD  Result Value Ref Range Status   Specimen Description   Final    BLOOD BLOOD LEFT WRIST BLOOD RIGHT WRIST Performed at [facility], [address], [place], [place] [address]    Special Requests   Final    Blood Culture adequate volume BOTTLES DRAWN AEROBIC AND ANAEROBIC Performed at [facility], [address] [place], [place] [address]    Culture   Final    NO GROWTH 3 DAYS Performed at [facility], [address] [place], [place] [address]    Report Status PENDING  Incomplete  Resp Panel by RT-PCR (Flu A&B, Covid) Nasopharyngeal Swab     Status: None   Collection Time: 02/02/21  5:06 PM   Specimen: Nasopharyngeal Swab; Nasopharyngeal(NP) swabs in vial transport medium  Result Value Ref Range Status   SARS Coronavirus 2 by RT PCR NEGATIVE NEGATIVE Final    Comment: (NOTE) SARS-CoV-2 target nucleic acids are NOT DETECTED.  The SARS-CoV-2 RNA is generally detectable  in upper respiratory specimens during the acute phase of infection. The lowest concentration of SARS-CoV-2 viral copies this assay can detect is 138 copies/mL. A negative result does not preclude SARS-Cov-2 infection and should not be used as the sole basis for treatment or other patient management decisions. A negative result may occur with  improper specimen collection/handling, submission of specimen other than nasopharyngeal swab, presence of viral mutation(s) within the areas targeted by this assay, and inadequate number of viral copies(<138 copies/mL). A negative result must be combined with clinical observations, patient history, and epidemiological information. The expected result is Negative.  Fact Sheet for Patients:  BloggerCourse.com  Fact Sheet for Healthcare Providers:  SeriousBroker.it  This test is no t yet approved or cleared by the [place] FDA and  has been authorized for detection and/or diagnosis of SARS-CoV-2 by FDA under an Emergency Use Authorization (EUA). This EUA will remain  in effect (meaning this test can be used) for the duration of  the COVID-19 declaration under Section 564(b)(1) of the Act, 21 U.S.C.section 360bbb-3(b)(1), unless the authorization is terminated  or revoked sooner.       Influenza A by PCR NEGATIVE NEGATIVE Final   Influenza B by PCR NEGATIVE NEGATIVE Final    Comment: (NOTE) The Xpert Xpress SARS-CoV-2/FLU/RSV plus assay is intended as an aid in the diagnosis of influenza from Nasopharyngeal swab specimens and should not be used as a sole basis for treatment. Nasal washings and aspirates are unacceptable for Xpert Xpress SARS-CoV-2/FLU/RSV testing.  Fact Sheet for Patients: BloggerCourse.com  Fact Sheet for Healthcare Providers: SeriousBroker.it  This test is not yet approved or cleared by the [place] FDA and has been authorized for detection and/or diagnosis of SARS-CoV-2 by FDA under an Emergency Use Authorization (EUA). This EUA will remain in effect (meaning this test can be used) for the duration of the COVID-19 declaration under Section 564(b)(1) of the Act, 21 U.S.C. section 360bbb-3(b)(1), unless the authorization is terminated or revoked.  Performed at [facility], [address], [place], [place] [address]      Labs: BNP (last 3 results) No results for input(s): BNP in the last 8760 hours. Basic Metabolic Panel: Recent Labs  Lab 01/30/21 0039 02/02/21 1352 02/05/21 0417  NA 139 137  --   K 3.5 3.6  --   CL 109 104  --   CO2 23 22  --   GLUCOSE 91 99  --   BUN 8 6  --   CREATININE 0.92 0.76 1.01*  CALCIUM 8.8* 9.5  --    Liver Function Tests: Recent Labs  Lab 01/30/21 0039 02/02/21 1352  AST 15 13*  ALT 13 14  ALKPHOS 50 54  BILITOT 0.4 0.7  PROT 6.6 7.4  ALBUMIN 3.5 4.2   No results for input(s): LIPASE, AMYLASE in the last 168 hours.  No results for input(s): AMMONIA in the last 168 hours. CBC: Recent Labs  Lab 01/30/21 0039 02/02/21 1352 02/03/21 0526 02/04/21 0730   WBC 7.9 13.4* 14.3* 10.1  NEUTROABS  --  10.5*  --  7.1  HGB 12.2 13.0 12.1 12.3  HCT 37.0 37.8 35.9* 36.1  MCV 94.1 92.0 94.5 93.5  PLT 310 304 283 287   Cardiac Enzymes: No results for input(s): CKTOTAL, CKMB, CKMBINDEX, TROPONINI in the last 168 hours. BNP: Invalid input(s): POCBNP CBG: Recent Labs  Lab 01/30/21 0727  GLUCAP 92   D-Dimer No results for input(s): DDIMER in the last 72 hours. Hgb  A1c No results for input(s): HGBA1C in the last 72 hours. Lipid Profile No results for input(s): CHOL, HDL, LDLCALC, TRIG, CHOLHDL, LDLDIRECT in the last 72 hours. Thyroid function studies No results for input(s): TSH, T4TOTAL, T3FREE, THYROIDAB in the last 72 hours.  Invalid input(s): FREET3 Anemia work up No results for input(s): VITAMINB12, FOLATE, FERRITIN, TIBC, IRON, RETICCTPCT in the last 72 hours. Urinalysis    Component Value Date/Time   COLORURINE YELLOW 01/29/2021 1223   APPEARANCEUR HAZY (A) 01/29/2021 1223   LABSPEC 1.021 01/29/2021 1223   PHURINE 6.0 01/29/2021 1223   GLUCOSEU NEGATIVE 01/29/2021 1223   HGBUR SMALL (A) 01/29/2021 1223   HGBUR negative 06/14/2009 1430   BILIRUBINUR NEGATIVE 01/29/2021 1223   KETONESUR NEGATIVE 01/29/2021 1223   PROTEINUR NEGATIVE 01/29/2021 1223   UROBILINOGEN 0.2 04/02/2015 0425   NITRITE NEGATIVE 01/29/2021 1223   LEUKOCYTESUR NEGATIVE 01/29/2021 1223   Sepsis Labs Invalid input(s): PROCALCITONIN,  WBC,  LACTICIDVEN Microbiology Recent Results (from the past 240 hour(s))  Resp Panel by RT-PCR (Flu A&B, Covid) Nasopharyngeal Swab     Status: None   Collection Time: 01/29/21  8:32 PM   Specimen: Nasopharyngeal Swab; Nasopharyngeal(NP) swabs in vial transport medium  Result Value Ref Range Status   SARS Coronavirus 2 by RT PCR NEGATIVE NEGATIVE Final    Comment: (NOTE) SARS-CoV-2 target nucleic acids are NOT DETECTED.  The SARS-CoV-2 RNA is generally detectable in upper respiratory specimens during the acute phase of  infection. The lowest concentration of SARS-CoV-2 viral copies this assay can detect is 138 copies/mL. A negative result does not preclude SARS-Cov-2 infection and should not be used as the sole basis for treatment or other patient management decisions. A negative result may occur with  improper specimen collection/handling, submission of specimen other than nasopharyngeal swab, presence of viral mutation(s) within the areas targeted by this assay, and inadequate number of viral copies(<138 copies/mL). A negative result must be combined with clinical observations, patient history, and epidemiological information. The expected result is Negative.  Fact Sheet for Patients:  BloggerCourse.comhttps://www.fda.gov/media/152166/download  Fact Sheet for Healthcare Providers:  SeriousBroker.ithttps://www.fda.gov/media/152162/download  This test is no t yet approved or cleared by the [place] FDA and  has been authorized for detection and/or diagnosis of SARS-CoV-2 by FDA under an Emergency Use Authorization (EUA). This EUA will remain  in effect (meaning this test can be used) for the duration of the COVID-19 declaration under Section 564(b)(1) of the Act, 21 U.S.C.section 360bbb-3(b)(1), unless the authorization is terminated  or revoked sooner.       Influenza A by PCR NEGATIVE NEGATIVE Final   Influenza B by PCR NEGATIVE NEGATIVE Final    Comment: (NOTE) The Xpert Xpress SARS-CoV-2/FLU/RSV plus assay is intended as an aid in the diagnosis of influenza from Nasopharyngeal swab specimens and should not be used as a sole basis for treatment. Nasal washings and aspirates are unacceptable for Xpert Xpress SARS-CoV-2/FLU/RSV testing.  Fact Sheet for Patients: BloggerCourse.comhttps://www.fda.gov/media/152166/download  Fact Sheet for Healthcare Providers: SeriousBroker.ithttps://www.fda.gov/media/152162/download  This test is not yet approved or cleared by the [place] FDA and has been authorized for detection and/or diagnosis of SARS-CoV-2  by FDA under an Emergency Use Authorization (EUA). This EUA will remain in effect (meaning this test can be used) for the duration of the COVID-19 declaration under Section 564(b)(1) of the Act, 21 U.S.C. section 360bbb-3(b)(1), unless the authorization is terminated or revoked.  Performed at [facility], [address] [place], [place] [address]  Blood culture (routine x 2)     Status: None (Preliminary result)   Collection Time: 02/02/21  4:53 PM   Specimen: BLOOD  Result Value Ref Range Status   Specimen Description   Final    BLOOD RIGHT ANTECUBITAL Performed at [facility], [address] [place], [place] [address]    Special Requests   Final    Blood Culture adequate volume BOTTLES DRAWN AEROBIC AND ANAEROBIC Performed at [facility], [address] [place], [place] [address]    Culture   Final    NO GROWTH 3 DAYS Performed at [facility], [address], [place], [place] [address]    Report Status PENDING  Incomplete  Blood culture (routine x 2)     Status: None (Preliminary result)   Collection Time: 02/02/21  4:55 PM   Specimen: BLOOD  Result Value Ref Range Status   Specimen Description   Final    BLOOD BLOOD LEFT WRIST BLOOD RIGHT WRIST Performed at [facility], [address] [place], [place] [address]    Special Requests   Final    Blood Culture adequate volume BOTTLES DRAWN AEROBIC AND ANAEROBIC Performed at [facility], [address], [place], [place] [address]    Culture   Final    NO GROWTH 3 DAYS Performed at [facility], [address], [place], [place] [address]    Report Status PENDING  Incomplete  Resp Panel by RT-PCR (Flu A&B, Covid) Nasopharyngeal Swab     Status: None   Collection Time: 02/02/21  5:06 PM   Specimen: Nasopharyngeal Swab; Nasopharyngeal(NP) swabs in vial transport medium  Result Value Ref Range Status   SARS Coronavirus  2 by RT PCR NEGATIVE NEGATIVE Final    Comment: (NOTE) SARS-CoV-2 target nucleic acids are NOT DETECTED.  The SARS-CoV-2 RNA is generally detectable in upper respiratory specimens during the acute phase of infection. The lowest concentration of SARS-CoV-2 viral copies this assay can detect is 138 copies/mL. A negative result does not preclude SARS-Cov-2 infection and should not be used as the sole basis for treatment or other patient management decisions. A negative result may occur with  improper specimen collection/handling, submission of specimen other than nasopharyngeal swab, presence of viral mutation(s) within the areas targeted by this assay, and inadequate number of viral copies(<138 copies/mL). A negative result must be combined with clinical observations, patient history, and epidemiological information. The expected result is Negative.  Fact Sheet for Patients:  BloggerCourse.com  Fact Sheet for Healthcare Providers:  SeriousBroker.it  This test is no t yet approved or cleared by the [place] FDA and  has been authorized for detection and/or diagnosis of SARS-CoV-2 by FDA under an Emergency Use Authorization (EUA). This EUA will remain  in effect (meaning this test can be used) for the duration of the COVID-19 declaration under Section 564(b)(1) of the Act, 21 U.S.C.section 360bbb-3(b)(1), unless the authorization is terminated  or revoked sooner.       Influenza A by PCR NEGATIVE NEGATIVE Final   Influenza B by PCR NEGATIVE NEGATIVE Final    Comment: (NOTE) The Xpert Xpress SARS-CoV-2/FLU/RSV plus assay is intended as an aid in the diagnosis of influenza from Nasopharyngeal swab specimens and should not be used as a sole basis for treatment. Nasal washings and aspirates are unacceptable for Xpert Xpress SARS-CoV-2/FLU/RSV testing.  Fact Sheet for Patients: BloggerCourse.com  Fact  Sheet for Healthcare Providers: SeriousBroker.it  This test is not  yet approved or cleared by the [place] and has been authorized for detection and/or diagnosis of SARS-CoV-2 by FDA under an Emergency Use Authorization (EUA). This EUA will remain in effect (meaning this test can be used) for the duration of the COVID-19 declaration under Section 564(b)(1) of the Act, 21 U.S.C. section 360bbb-3(b)(1), unless the authorization is terminated or revoked.  Performed at [facility], [address], [place], [place] [address]      Time coordinating discharge:  28 minutes  SIGNED:   Dorcas Carrow, MD  [facility] 02/05/2021, 2:00 PM

## 2021-02-05 NOTE — Plan of Care (Signed)
  Problem: Education: Goal: Knowledge of General Education information will improve Description: Including pain rating scale, medication(s)/side effects and non-pharmacologic comfort measures Outcome: Progressing   Problem: Health Behavior/Discharge Planning: Goal: Ability to manage health-related needs will improve Outcome: Progressing   Problem: Clinical Measurements: Goal: Ability to maintain clinical measurements within normal limits will improve Outcome: Progressing Goal: Will remain free from infection Outcome: Progressing Goal: Diagnostic test results will improve Outcome: Progressing Goal: Respiratory complications will improve Outcome: Progressing Goal: Cardiovascular complication will be avoided Outcome: Progressing   Problem: Activity: Goal: Risk for activity intolerance will decrease Outcome: Progressing   Problem: Elimination: Goal: Will not experience complications related to bowel motility Outcome: Progressing Goal: Will not experience complications related to urinary retention Outcome: Progressing   Problem: Pain Managment: Goal: General experience of comfort will improve Outcome: Progressing   

## 2021-02-07 LAB — CULTURE, BLOOD (ROUTINE X 2)
Culture: NO GROWTH
Culture: NO GROWTH
Special Requests: ADEQUATE
Special Requests: ADEQUATE

## 2021-02-09 ENCOUNTER — Other Ambulatory Visit: Payer: Self-pay

## 2021-02-09 MED ORDER — BIS SUBCIT-METRONID-TETRACYC 140-125-125 MG PO CAPS: 3.0000 | ORAL_CAPSULE | Freq: Three times a day (TID) | ORAL | 0 refills | Status: DC

## 2021-02-13 ENCOUNTER — Telehealth: Payer: Self-pay | Admitting: Internal Medicine

## 2021-02-13 ENCOUNTER — Encounter: Payer: Self-pay | Admitting: *Deleted

## 2021-02-13 ENCOUNTER — Other Ambulatory Visit: Payer: Self-pay | Admitting: Internal Medicine

## 2021-02-13 MED ORDER — DOXYCYCLINE HYCLATE 100 MG PO TABS: 100.0000 mg | ORAL_TABLET | Freq: Two times a day (BID) | ORAL | 0 refills | Status: AC

## 2021-02-13 MED ORDER — BISMUTH SUBSALICYLATE 262 MG PO CHEW: 524.0000 mg | CHEWABLE_TABLET | Freq: Four times a day (QID) | ORAL | 0 refills | Status: AC

## 2021-02-13 MED ORDER — METRONIDAZOLE 250 MG PO TABS: 250.0000 mg | ORAL_TABLET | Freq: Two times a day (BID) | ORAL | 0 refills | Status: AC

## 2021-02-13 NOTE — Telephone Encounter (Signed)
I have spoken to patient to advise that we can give her a breakdown of medications in place of Pylera to eradicate her H Pylori. Rx's for doxycycline, metronidazole, pepto bismul have been sent to her pharmacy and she is instructed to take pantoprazole as already prescribed. Patient verbalizes understanding of this and detailed instructions on how/when to take medications has been sent to her via mychart.

## 2021-02-14 ENCOUNTER — Telehealth: Payer: Self-pay | Admitting: Internal Medicine

## 2021-02-14 NOTE — Telephone Encounter (Signed)
Spoke with patient and informed her of instructions on how to take medication for H. Pylori. Also reviewed with patient on when to come in for stool study after completing medication.

## 2021-02-14 NOTE — Telephone Encounter (Signed)
Left message for patient to call office. [name] sent detailed instructions via mychart. I will place a copy of instructions into the mail for the patient.

## 2021-03-24 ENCOUNTER — Telehealth: Payer: Self-pay

## 2021-03-24 NOTE — Telephone Encounter (Signed)
Left message for pt to call back  °

## 2021-03-24 NOTE — Telephone Encounter (Signed)
-----   Message from Chrystie Nose, RN sent at 02/09/2021  1:21 PM EDT ----- Regarding: Stool for h pylori Stool test for h pylori due/make sure hold protonix for 2 weeks

## 2021-03-27 ENCOUNTER — Other Ambulatory Visit: Payer: Self-pay

## 2021-03-27 DIAGNOSIS — B9681 Helicobacter pylori [H. pylori] as the cause of diseases classified elsewhere: Secondary | ICD-10-CM

## 2021-03-27 NOTE — Telephone Encounter (Signed)
Pt notified that it is time for the pt to be tested for the H Pylori. Pt instructed to stop her Protonix for 2 weeks and then come to the lab for the stool collection Kit. Order entered into Labs. Pt verbalized understanding with all questions.

## 2021-04-10 ENCOUNTER — Other Ambulatory Visit: Payer: Medicaid Other

## 2021-04-10 DIAGNOSIS — B9681 Helicobacter pylori [H. pylori] as the cause of diseases classified elsewhere: Secondary | ICD-10-CM

## 2021-04-12 LAB — H. PYLORI ANTIGEN, STOOL: H pylori Ag, Stl: POSITIVE — AB

## 2021-04-21 ENCOUNTER — Other Ambulatory Visit: Payer: Self-pay

## 2021-04-21 DIAGNOSIS — B9681 Helicobacter pylori [H. pylori] as the cause of diseases classified elsewhere: Secondary | ICD-10-CM

## 2021-04-21 MED ORDER — PANTOPRAZOLE SODIUM 40 MG PO TBEC: 40.0000 mg | DELAYED_RELEASE_TABLET | Freq: Two times a day (BID) | ORAL | 0 refills | Status: DC

## 2021-04-21 MED ORDER — TALICIA 250-12.5-10 MG PO CPDR: 4.0000 | DELAYED_RELEASE_CAPSULE | Freq: Three times a day (TID) | ORAL | 0 refills | Status: DC

## 2021-04-29 ENCOUNTER — Other Ambulatory Visit: Payer: Self-pay | Admitting: Internal Medicine

## 2021-04-29 ENCOUNTER — Ambulatory Visit (INDEPENDENT_AMBULATORY_CARE_PROVIDER_SITE_OTHER): Payer: Medicaid Other

## 2021-04-29 ENCOUNTER — Other Ambulatory Visit: Payer: Self-pay

## 2021-04-29 ENCOUNTER — Encounter (HOSPITAL_COMMUNITY): Payer: Self-pay

## 2021-04-29 ENCOUNTER — Ambulatory Visit (HOSPITAL_COMMUNITY)
Admission: EM | Admit: 2021-04-29 | Discharge: 2021-04-29 | Disposition: A | Discharge status: Home / Self Care | Payer: Medicaid Other | Attending: Internal Medicine | Admitting: Internal Medicine

## 2021-04-29 DIAGNOSIS — M7918 Myalgia, other site: Secondary | ICD-10-CM

## 2021-04-29 DIAGNOSIS — S161XXA Strain of muscle, fascia and tendon at neck level, initial encounter: Secondary | ICD-10-CM

## 2021-04-29 DIAGNOSIS — M25512 Pain in left shoulder: Secondary | ICD-10-CM

## 2021-04-29 MED ORDER — ACETAMINOPHEN 500 MG PO TABS: 500.0000 mg | ORAL_TABLET | Freq: Three times a day (TID) | ORAL | 0 refills | Status: AC | PRN

## 2021-04-29 MED ORDER — BACLOFEN 10 MG PO TABS: 10.0000 mg | ORAL_TABLET | Freq: Two times a day (BID) | ORAL | 0 refills | Status: AC

## 2021-04-29 MED ORDER — ACETAMINOPHEN 325 MG PO TABS
650.0000 mg | ORAL_TABLET | Freq: Once | ORAL | Status: AC
  Administered 2021-04-29: 650 mg via ORAL

## 2021-04-29 MED ORDER — KETOROLAC TROMETHAMINE 60 MG/2ML IM SOLN
60.0000 mg | Freq: Once | INTRAMUSCULAR | Status: AC
  Administered 2021-04-29: 60 mg via INTRAMUSCULAR

## 2021-04-29 MED ORDER — KETOROLAC TROMETHAMINE 60 MG/2ML IM SOLN
INTRAMUSCULAR | Status: AC
  Filled 2021-04-29: qty 2

## 2021-04-29 MED ORDER — ACETAMINOPHEN 325 MG PO TABS
ORAL_TABLET | ORAL | Status: AC
  Filled 2021-04-29: qty 2
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
## 2021-04-29 NOTE — Discharge Instructions (Addendum)
The x-ray of your left shoulder is not remarkable for any significant injury.  Believe that you have suffered muscle strain and will likely have continued muscle spasm for the next several days.  For this reason, provided with a ketorolac injection today for immediate pain relief and a prescription for baclofen that you can take twice daily for the next 14 days.  I also recommend that you begin Tylenol 1000 mg scheduled 3 times a day for the next 7 days to stay ahead of your pain.  I have provided a prescription for this as well.  As a friendly reminder, I also see in your chart that your next Depo Provera injection is due around November 7.  Thank you for visiting urgent care this morning.  I hope you are doing better very soon.

## 2021-05-30 ENCOUNTER — Other Ambulatory Visit: Payer: Self-pay

## 2021-05-30 ENCOUNTER — Encounter (HOSPITAL_COMMUNITY): Payer: Self-pay

## 2021-05-30 ENCOUNTER — Ambulatory Visit (HOSPITAL_COMMUNITY)
Admission: EM | Admit: 2021-05-30 | Discharge: 2021-05-30 | Disposition: A | Discharge status: Home / Self Care | Payer: Medicaid Other | Attending: Emergency Medicine | Admitting: Emergency Medicine

## 2021-05-30 DIAGNOSIS — Z20822 Contact with and (suspected) exposure to covid-19: Secondary | ICD-10-CM | POA: Insufficient documentation

## 2021-05-30 DIAGNOSIS — J09X2 Influenza due to identified novel influenza A virus with other respiratory manifestations: Secondary | ICD-10-CM | POA: Diagnosis present

## 2021-05-30 LAB — POC INFLUENZA A AND B ANTIGEN (URGENT CARE ONLY)
INFLUENZA A ANTIGEN, POC: POSITIVE — AB
INFLUENZA B ANTIGEN, POC: NEGATIVE

## 2021-05-30 LAB — SARS CORONAVIRUS 2 (TAT 6-24 HRS): SARS Coronavirus 2: NEGATIVE

## 2021-05-30 NOTE — Discharge Instructions (Addendum)
Use over the counter cold and flu medicines to help manage your symptoms. Drink lots of liquids and stay hydrated.

## 2021-05-30 NOTE — ED Triage Notes (Signed)
Pt presents to the office for cough,congestion and fever x 3 days.

## 2021-05-30 NOTE — ED Provider Notes (Signed)
[facility]    CSN: [id number] Arrival date & time: 05/30/21  0830      History   Chief Complaint Chief Complaint  Patient presents with   Fever   Cough   Sore Throat    HPI [name] is a 40 y.o. female.  Patient began feeling ill over 48 hours ago the morning of 05/28/2021.  Her 83-year-old son was seen in the ER a couple of days prior but did not have influenza.  Review of his records show that he had parainfluenza.  Patient reports abrupt onset fever, body aches, sore throat, congestion, cough.  Has been managing symptoms with NyQuil or Tylenol Cold medicine.   Fever Associated symptoms: chills, congestion, cough, rhinorrhea and sore throat   Cough Associated symptoms: chills, fever, rhinorrhea and sore throat   Sore Throat   Past Medical History:  Diagnosis Date   Anemia    Asthma    Erosive esophagitis    distal esophagus, with low grade bleed 03/2009   GERD (gastroesophageal reflux disease)    Headache(784.0)    History of migraine headaches    IBS (irritable bowel syndrome)    Infection    UTI    Patient Active Problem List   Diagnosis Date Noted   Abscess of breast, left 02/03/2021   Cellulitis of left breast 02/02/2021   Hematemesis    Acute gastric ulcer    Acute gastritis with hemorrhage    Acute upper GI bleeding 01/29/2021   Pain of upper abdomen    Subluxation of extensor carpi ulnaris tendon, right, subsequent encounter 03/17/2020   HNP (herniated nucleus pulposus), lumbar 01/05/2016   Depo contraception 02/17/2015   Chronic back pain greater than 3 months duration 12/14/2014   Normal labor 06/12/2014   Flu vaccine need 11/03/2013   Hyperemesis complicating pregnancy, antepartum 11/03/2013   Healthcare maintenance 01/13/2013   Seasonal allergies 01/13/2013   Mesenteric adenitis 02/05/2012   Pes planus (flat feet) 06/11/2011   Reflux esophagitis 03/28/2009   Asthma, mild intermittent, well-controlled 03/11/2009     Past Surgical History:  Procedure Laterality Date   BIOPSY  01/30/2021   Procedure: BIOPSY;  Surgeon: Jerene Bears, MD;  Location: [facility] ENDOSCOPY;  Service: Gastroenterology;;   ESOPHAGOGASTRODUODENOSCOPY (EGD) WITH PROPOFOL N/A 01/30/2021   Procedure: ESOPHAGOGASTRODUODENOSCOPY (EGD) WITH PROPOFOL;  Surgeon: Jerene Bears, MD;  Location: [facility];  Service: Gastroenterology;  Laterality: N/A;   LUMBAR LAMINECTOMY/DECOMPRESSION MICRODISCECTOMY Right 01/05/2016   Procedure: Right Lumbar three-four Microdiskectomy;  Surgeon: Ashok Pall, MD;  Location: [facility];  Service: Neurosurgery;  Laterality: Right;  right   WRIST SURGERY Right     OB History     Gravida  3   Para  3   Term  3   Preterm      AB      Living  3      SAB      IAB      Ectopic      Multiple  0   Live Births  3            Home Medications    Prior to Admission medications   Medication Sig Start Date End Date Taking? Authorizing Provider  albuterol (VENTOLIN HFA) 108 (90 Base) MCG/ACT inhaler Inhale 2 puffs into the lungs every 6 (six) hours as needed for wheezing or shortness of breath. 02/05/21   Barb Merino, MD  medroxyPROGESTERone (DEPO-PROVERA) 150 MG/ML injection Inject 150 mg into the  muscle every 3 (three) months. 12/06/20   [provider]  pantoprazole (PROTONIX) 40 MG tablet Take 1 tablet (40 mg total) by mouth 2 (two) times daily. Take for 14 days. 04/21/21   Pyrtle, Lajuan Lines, MD  polyethylene glycol (MIRALAX / GLYCOLAX) 17 g packet Take 17 g by mouth daily. 02/06/21   Barb Merino, MD  omeprazole (PRILOSEC) 20 MG capsule Take 1 capsule (20 mg total) by mouth daily. Patient not taking: Reported on 12/23/2018 07/18/18 03/16/19  Rodriguez-Southworth, Sunday Spillers, PA-C    Family History Family History  Problem Relation Age of Onset   Migraines Mother    Asthma Mother    Migraines Father    Heart disease Father    Asthma Daughter    Cancer Maternal Grandmother     Social  History Social History   Tobacco Use   Smoking status: Light Smoker   Smokeless tobacco: Never  Vaping Use   Vaping Use: Never used  Substance Use Topics   Alcohol use: No    Comment: occasional on holidays   Drug use: No     Allergies   Ibuprofen and Tramadol   Review of Systems Review of Systems  Constitutional:  Positive for appetite change, chills, fatigue and fever.  HENT:  Positive for congestion, rhinorrhea and sore throat.   Respiratory:  Positive for cough.     Physical Exam Triage Vital Signs ED Triage Vitals [05/30/21 1046]  Enc Vitals Group     BP (!) 142/87     Pulse Rate 80     Resp 18     Temp 99.9 F (37.7 C)     Temp Source Oral     SpO2 100 %     Weight      Height      Head Circumference      Peak Flow      Pain Score 8     Pain Loc      Pain Edu?      Excl. in [place]?    No data found.  Updated Vital Signs BP (!) 142/87 (BP Location: Left Arm)   Pulse 80   Temp 99.9 F (37.7 C) (Oral)   Resp 18   SpO2 100%   Visual Acuity Right Eye Distance:   Left Eye Distance:   Bilateral Distance:    Right Eye Near:   Left Eye Near:    Bilateral Near:     Physical Exam Constitutional:      Appearance: She is ill-appearing.  HENT:     Right Ear: Tympanic membrane, ear canal and external ear normal.     Left Ear: Tympanic membrane, ear canal and external ear normal.     Nose: Congestion present.     Mouth/Throat:     Mouth: Mucous membranes are moist.     Pharynx: Oropharynx is clear.  Cardiovascular:     Rate and Rhythm: Normal rate and regular rhythm.  Pulmonary:     Effort: Pulmonary effort is normal.     Breath sounds: Normal breath sounds.  Neurological:     Mental Status: She is alert.     UC Treatments / Results  Labs (all labs ordered are listed, but only abnormal results are displayed) Labs Reviewed  POC INFLUENZA A AND B ANTIGEN (URGENT CARE ONLY) - Abnormal; Notable for the following components:      Result Value    INFLUENZA A ANTIGEN, POC POSITIVE (*)    All other components within normal  limits  SARS CORONAVIRUS 2 (TAT 6-24 HRS)    EKG   Radiology No results found.  Procedures Procedures (including critical care time)  Medications Ordered in UC Medications - No data to display  Initial Impression / Assessment and Plan / UC Course  I have reviewed the triage vital signs and the nursing notes.  Pertinent labs & imaging results that were available during my care of the patient were reviewed by me and considered in my medical decision making (see chart for details).    Influenza positive here but she is outside of the 48-hour window to initiate Tamiflu.  Reviewed supportive care measures.  Given note for work.  Final Clinical Impressions(s) / UC Diagnoses   Final diagnoses:  Influenza due to identified novel influenza A virus with other respiratory manifestations     Discharge Instructions      Use over the counter cold and flu medicines to help manage your symptoms. Drink lots of liquids and stay hydrated.    ED Prescriptions   None    PDMP not reviewed this encounter.   Cathlyn Parsons, NP 05/30/21 1119

## 2021-06-23 ENCOUNTER — Telehealth: Payer: Self-pay

## 2021-06-23 NOTE — Telephone Encounter (Signed)
-----   Message from Antony Blackbird, RN sent at 04/21/2021 10:43 AM EDT ----- Have patient come in for H-Pylori stool test = 06/30/21. Order in [place]

## 2021-06-23 NOTE — Telephone Encounter (Signed)
Left message for pt to call back  °

## 2021-06-30 NOTE — Telephone Encounter (Signed)
Spoke with pt. Let pt know it was time for her to complete H. Pylori stool test. Pt verbalized understanding and confirmed she had completed the medication 8 weeks ago. Pt said she would be able to come to the lab next week.

## 2021-07-16 ENCOUNTER — Encounter (HOSPITAL_BASED_OUTPATIENT_CLINIC_OR_DEPARTMENT_OTHER): Payer: Self-pay

## 2021-07-16 ENCOUNTER — Other Ambulatory Visit: Payer: Self-pay

## 2021-07-16 DIAGNOSIS — M25571 Pain in right ankle and joints of right foot: Secondary | ICD-10-CM | POA: Insufficient documentation

## 2021-07-16 DIAGNOSIS — M79671 Pain in right foot: Secondary | ICD-10-CM | POA: Insufficient documentation

## 2021-07-16 DIAGNOSIS — M25579 Pain in unspecified ankle and joints of unspecified foot: Secondary | ICD-10-CM

## 2021-07-16 NOTE — ED Triage Notes (Signed)
Pt presents to the ED with right ankle pain that started on Thursday. Denies injury or trauma. States that a few years ago she had tendonitis and it felt similar. Pt A&Ox4 at time of triage. VSS. Denies pain meds pta.

## 2021-07-17 ENCOUNTER — Encounter: Payer: Self-pay | Admitting: Family Medicine

## 2021-07-17 ENCOUNTER — Emergency Department (HOSPITAL_BASED_OUTPATIENT_CLINIC_OR_DEPARTMENT_OTHER)
Admission: EM | Admit: 2021-07-17 | Discharge: 2021-07-17 | Disposition: A | Discharge status: Home / Self Care | Payer: 59 | Attending: Emergency Medicine | Admitting: Emergency Medicine

## 2021-07-17 ENCOUNTER — Ambulatory Visit: Payer: Self-pay

## 2021-07-17 ENCOUNTER — Emergency Department (HOSPITAL_BASED_OUTPATIENT_CLINIC_OR_DEPARTMENT_OTHER): Payer: 59

## 2021-07-17 ENCOUNTER — Ambulatory Visit: Payer: 59 | Admitting: Family Medicine

## 2021-07-17 VITALS — BP 148/100 | Ht 65.0 in | Wt 215.0 lb

## 2021-07-17 DIAGNOSIS — M25571 Pain in right ankle and joints of right foot: Secondary | ICD-10-CM

## 2021-07-17 DIAGNOSIS — M25579 Pain in unspecified ankle and joints of unspecified foot: Secondary | ICD-10-CM

## 2021-07-17 DIAGNOSIS — M79671 Pain in right foot: Secondary | ICD-10-CM | POA: Diagnosis not present

## 2021-07-17 MED ORDER — ACETAMINOPHEN 500 MG PO TABS
1000.0000 mg | ORAL_TABLET | Freq: Once | ORAL | Status: AC
  Administered 2021-07-17: 1000 mg via ORAL
  Filled 2021-07-17: qty 2

## 2021-07-17 MED ORDER — KETOROLAC TROMETHAMINE 30 MG/ML IJ SOLN
30.0000 mg | Freq: Once | INTRAMUSCULAR | Status: AC
  Administered 2021-07-17: 30 mg via INTRAMUSCULAR
  Filled 2021-07-17: qty 1

## 2021-07-17 MED ORDER — NAPROXEN 250 MG PO TABS
500.0000 mg | ORAL_TABLET | Freq: Once | ORAL | Status: DC
  Filled 2021-07-17: qty 2

## 2021-07-17 MED ORDER — NAPROXEN 500 MG PO TABS: 500.0000 mg | ORAL_TABLET | Freq: Two times a day (BID) | ORAL | 0 refills | Status: DC

## 2021-07-17 NOTE — Progress Notes (Signed)
° ° °  SUBJECTIVE:   CHIEF COMPLAINT / HPI: right ankle pain  Ongoing for a while but has increased in the past month.  Primarily medial right ankle but also has lateral pain as well.  Has been using compression stockings nightly without much relief. Denies any fevers, increase in activity, trauma or recent injury.  No changes in footwear.  Pain worse when bearing weight. Seen in ED today for similar symptoms.  Xray negative for acute fracture or dislocation.  Was treated with Toradol and Tylenol.  Minimal relief.  Was placed in boot and recommended evaluation at [facility].  PERTINENT  PMH / PSH:  Plantar fasciitis Pes Planus  OBJECTIVE:   BP (!) 148/100    Ht 5\' 5"  (1.651 m)    Wt 215 lb (97.5 kg)    BMI 35.78 kg/m    General: Alert, no acute distress Right Foot/Ankle: Inspection: No erythema, ecchymosis or edema.  Longitudinal arch collapse Palpation: Pain elicited over post tib tendon, lateral ankle ROM: Limited eversion/inversion and plantar flexion/extension secondary to pain Strength 5/5 Sensation intact, pulses present and well perfused Talar tilt negative Anterior Drawer negative Thompson's test negative  Limited MSK u/s right ankle:  No ankle effusion.  Post tib tendon with small longitudinal tear, mild tenosynovitis.  Peroneal tendons intact without abnormalities.  Talar dome normal.  ATFL appears irregular.  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 -Voltaren gel QID -ACE bandage -Boot when walking -Tylenol prn -Ice as needed -Note to allow for work at home -Will likely need arch supports once rested and healed likely in couple of weeks -Will need ongoing rehab thereafter -Follow up in 2 weeks with Dr    Pearletha Forge, MD [facility]

## 2021-07-17 NOTE — Assessment & Plan Note (Addendum)
Suspect pain combination of posterior tibialis tendinopathy, ultrasound showing small tear.  Plantar fasciitis also contributing to pain and consistent with exam. -Voltaren gel QID -ACE bandage -Boot when walking -Tylenol prn -Ice as needed -Note to allow for work at home -Will likely need arch supports once rested and healed likely in couple of weeks -Will need ongoing rehab thereafter -Follow up in 2 weeks with Dr Pearletha Forge

## 2021-07-17 NOTE — Discharge Instructions (Signed)
Your x-rays are negative.  Take naproxen as instructed.  Use compression hose and boot if you have them at home.  You should to follow-up with sports medicine if not improving

## 2021-07-17 NOTE — ED Notes (Signed)
In to d/c patient and medicate for pain.  Pt states she is allergic to "ibuprofen" and request to speak with the MD. Dr. Wilkie Aye updated.

## 2021-07-17 NOTE — Patient Instructions (Signed)
Thank you for coming to see me today. It was a pleasure.   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 Either wrap the ankle with an ACE wrap or use an ankle sleeve.  Wear the boot when up and walking around the next 2 weeks. Icing 15 minutes at a time 3-4 times a day. Voltaren gel up to 4 times a day topically. Tylenol 500mg  1-2 tabs twice a day with food for pain. After this period of rest arch support and the home exercises will be very important - I'm concerned you'll need to do some of the rehab long term a couple times a week even after this improves. Follow up with me in 2 weeks for reevaluation.  If you have any questions or concerns, please do not hesitate to call the office   Best,   , MD

## 2021-07-17 NOTE — ED Provider Notes (Signed)
[facility] EMERGENCY DEPT Provider Note   CSN: [id number] Arrival date & time: 07/16/21  2315     History  Chief Complaint  Patient presents with   Ankle Pain    [name] is a 41 y.o. female.  HPI     This a 41 year old female with a history of reflux and asthma who presents with right foot pain.  She denies any injury.  She reports progressively worsening right foot pain which is similar to prior pain that she has had that she relates to tendinitis.  She reports that she previously had to wear compression hose and arch support related to her prior diagnosis.  She tried to wear compression hose but this did not work.  No history of gout.  Denies fever.  Denies injury.  She states pain is worse when she bears weight.  She has not taken anything for her pain.  Rates her pain 8 out of 10.  Home Medications Prior to Admission medications   Medication Sig Start Date End Date Taking? Authorizing Provider  naproxen (NAPROSYN) 500 MG tablet Take 1 tablet (500 mg total) by mouth 2 (two) times daily. 07/17/21  Yes Rosangelica Pevehouse, Barbette Hair, MD  albuterol (VENTOLIN HFA) 108 (90 Base) MCG/ACT inhaler Inhale 2 puffs into the lungs every 6 (six) hours as needed for wheezing or shortness of breath. 02/05/21   Barb Merino, MD  medroxyPROGESTERone (DEPO-PROVERA) 150 MG/ML injection Inject 150 mg into the muscle every 3 (three) months. 12/06/20   [provider]  pantoprazole (PROTONIX) 40 MG tablet Take 1 tablet (40 mg total) by mouth 2 (two) times daily. Take for 14 days. 04/21/21   Pyrtle, Lajuan Lines, MD  polyethylene glycol (MIRALAX / GLYCOLAX) 17 g packet Take 17 g by mouth daily. 02/06/21   Barb Merino, MD  omeprazole (PRILOSEC) 20 MG capsule Take 1 capsule (20 mg total) by mouth daily. Patient not taking: Reported on 12/23/2018 07/18/18 03/16/19  Rodriguez-Southworth, Sunday Spillers, PA-C      Allergies    Ibuprofen and Tramadol    Review of Systems   Review of Systems   Constitutional:  Negative for fever.  Respiratory:  Negative for shortness of breath.   Cardiovascular:  Negative for chest pain.  Musculoskeletal:        Right foot and ankle pain  All other systems reviewed and are negative.  Physical Exam Updated Vital Signs BP 132/83    Pulse 83    Temp 98.4 F (36.9 C)    Resp 16    Ht 1.651 m (5\' 5" )    Wt 97.5 kg    SpO2 99%    BMI 35.78 kg/m  Physical Exam Vitals and nursing note reviewed.  Constitutional:      Appearance: She is well-developed. She is obese. She is not ill-appearing.  HENT:     Head: Normocephalic and atraumatic.     Nose: Nose normal.     Mouth/Throat:     Mouth: Mucous membranes are moist.  Eyes:     Pupils: Pupils are equal, round, and reactive to light.  Cardiovascular:     Rate and Rhythm: Normal rate and regular rhythm.  Pulmonary:     Effort: Pulmonary effort is normal. No respiratory distress.  Abdominal:     Palpations: Abdomen is soft.  Musculoskeletal:     Cervical back: Neck supple.     Comments: No obvious swelling of the right foot or ankle, there is tenderness to palpation over the dorsum  of the foot into the ankle, no plantar tenderness although the patient has very flat feet, no redness or swelling noted, normal range of motion of the ankle, neurovascular intact with 2+ DP pulse  Skin:    General: Skin is warm and dry.  Neurological:     Mental Status: She is alert and oriented to person, place, and time.  Psychiatric:        Mood and Affect: Mood normal.    ED Results / Procedures / Treatments   Labs (all labs ordered are listed, but only abnormal results are displayed) Labs Reviewed - No data to display  EKG None  Radiology DG Ankle Complete Right  Result Date: 07/17/2021 CLINICAL DATA:  Right ankle pain. EXAM: RIGHT ANKLE - COMPLETE 3+ VIEW COMPARISON:  None. FINDINGS: There is no evidence of fracture, dislocation, or joint effusion. There is no evidence of arthropathy or other focal  bone abnormality. Soft tissues are unremarkable. IMPRESSION: Negative. Electronically Signed   By: Virgina Norfolk M.D.   On: 07/17/2021 01:11    Procedures Procedures    Medications Ordered in ED Medications  naproxen (NAPROSYN) tablet 500 mg (has no administration in time range)    ED Course/ Medical Decision Making/ A&P Clinical Course as of 07/17/21 0226  Mon Jul 17, 2021  0214 Discharge, patient states she is unable to tolerate naproxen.  Reports history of ulcers.  Discussed with her that I felt she really needed an anti-inflammatory for her type of pain.  Given lack of alternatives, she was given an IM dose of Toradol which is the less likely to cause GI symptoms.  Recommend Tylenol at home.  She was also provided with a cam walker. [CH]    Clinical Course User Index [CH] Annalea Alguire, Barbette Hair, MD                           Medical Decision Making  This patient presents to the ED for concern of foot and ankle pain, this involves an extensive number of treatment options, and is a complaint that carries with it a high risk of complications and morbidity.  The differential diagnosis includes strain, tendinitis, arthritis, less likely septic arthritis  MDM:    Patient presents with foot and ankle pain.  Reports history of the same with tendinitis.  She is nontoxic and vital signs are reassuring.  No signs or symptoms of septic arthritis.  She has no obvious deformities.  Low suspicion for fracture but will obtain an x-ray to evaluate joint spacing and bones.  X-rays without obvious fracture or abnormality.  Suspect inflammatory pain or tendinitis.  Recommend that she follow-up again with sports medicine.  Patient reports that she is not to take NSAIDs because of gastritis.  She was given 1 dose of IM Toradol here given I highly suspect an inflammatory component.  Recommend Tylenol at home as well as arch support and compression hose as previously recommended.  Will for follow-up with  sports medicine. (Labs, imaging)  Labs: I Ordered, and personally interpreted labs.  The pertinent results include: None  Imaging Studies ordered: I ordered imaging studies including x-ray ankle negative I independently visualized and interpreted imaging. I agree with the radiologist interpretation  Additional history obtained from daughter.  External records from outside source obtained and reviewed including prior orthopedic and sports medicine records  Critical Interventions: Cam walker  Consultations: I requested consultation with the none,  and discussed lab and imaging  findings as well as pertinent plan - they recommend: None  Cardiac Monitoring: The patient was maintained on a cardiac monitor.  I personally viewed and interpreted the cardiac monitored which showed an underlying rhythm of: None  Reevaluation: After the interventions noted above, I reevaluated the patient and found that they have :stayed the same  Social Determinants of Health: Lives independently  Disposition: Discharge  Co morbidities that complicate the patient evaluation  Past Medical History:  Diagnosis Date   Anemia    Asthma    Erosive esophagitis    distal esophagus, with low grade bleed 03/2009   GERD (gastroesophageal reflux disease)    Headache(784.0)    History of migraine headaches    IBS (irritable bowel syndrome)    Infection    UTI     Medicines Meds ordered this encounter  Medications   naproxen (NAPROSYN) tablet 500 mg   naproxen (NAPROSYN) 500 MG tablet    Sig: Take 1 tablet (500 mg total) by mouth 2 (two) times daily.    Dispense:  30 tablet    Refill:  0   acetaminophen (TYLENOL) tablet 1,000 mg   ketorolac (TORADOL) 30 MG/ML injection 30 mg    I have reviewed the patients home medicines and have made adjustments as needed  Problem List / ED Course: Problem List Items Addressed This Visit   None Visit Diagnoses     Acute right ankle pain    -  Primary   Ankle  pain       Relevant Orders   DG Ankle Complete Right (Completed)                   Final Clinical Impression(s) / ED Diagnoses Final diagnoses:  Acute right ankle pain    Rx / DC Orders ED Discharge Orders          Ordered    naproxen (NAPROSYN) 500 MG tablet  2 times daily        07/17/21 0145              Beckey Polkowski, Barbette Hair, MD 07/17/21 [phone]

## 2021-07-18 ENCOUNTER — Telehealth: Payer: Self-pay | Admitting: Family Medicine

## 2021-07-18 NOTE — Telephone Encounter (Signed)
-----   Message from [facility], LAT sent at 07/18/2021 11:04 AM EST ----- Regarding: RE: phone message She would like to try the hydrocodone. I informed her to stop the tylenol while taking the hydrocodone. Pharmacy is in chart. Unable to start voltaren gel until she gets paid and can buy it on Friday.  Thanks! ----- Message ----- From: Lenda Kelp, MD Sent: 07/18/2021   9:52 AM EST To: Rutha Bouchard, LAT Subject: RE: phone message                              She can't take nsaids orally with history of ulcer.  She should also be using the voltaren gel though.  I can send in a short course of something like hydrocodone if she is ok taking that too.  ----- Message ----- From: Rutha Bouchard, LAT Sent: 07/18/2021   9:47 AM EST To: Lenda Kelp, MD Subject: FW: phone message                              Anything else I can offer her? ----- Message ----- From: Lizbeth Bark Sent: 07/18/2021   9:31 AM EST To: Rutha Bouchard, LAT Subject: phone message                                  Pt called stating that she is having a lot of pain in rt ankle. Tylenol is not helping.

## 2021-07-18 NOTE — Telephone Encounter (Signed)
Actually on reviewing the narcotic database it looks like she's prescribed Tylenol with Codeine twice a month from [facility] including it last being filled on January 3rd.  She will have to call their office ([place]).

## 2021-07-31 ENCOUNTER — Encounter: Payer: Self-pay | Admitting: Family Medicine

## 2021-07-31 ENCOUNTER — Other Ambulatory Visit: Payer: Self-pay

## 2021-07-31 ENCOUNTER — Ambulatory Visit (INDEPENDENT_AMBULATORY_CARE_PROVIDER_SITE_OTHER): Payer: 59 | Admitting: Family Medicine

## 2021-07-31 VITALS — BP 140/86 | Ht 65.0 in | Wt 215.0 lb

## 2021-07-31 DIAGNOSIS — M79671 Pain in right foot: Secondary | ICD-10-CM

## 2021-07-31 NOTE — Assessment & Plan Note (Signed)
Slightly improving but continues to have pain. Adherent to current therapy. -Low band exercises -Inserts with scaphoid pads  -Continue Voltaren gel 3-4 times daily -Continue Ice packs 3-4 times day -Discontinue use of boot -Avoid bare foot walking -Follow up in 4 weeks

## 2021-07-31 NOTE — Progress Notes (Signed)
° ° °  SUBJECTIVE:   CHIEF COMPLAINT / HPI: follow up for right ankle pain  Continues to have throbbing pain in posterior tibial tendon.  Has been compliant with wearing Boot but feels that boot is too big and more pressure is placed on heel.  Tried Vicodin for pain but stopped secondary to increasing headaches.  Reports able to move foot a little better, pain is slightly better than on initial presentation.  Denies any numbness, tingling or weakness.  Doing motion exercises at home.  PERTINENT  PMH / PSH:  Plantar Fasciitis Pes Planus  OBJECTIVE:   BP 140/86    Ht 5\' 5"  (1.651 m)    Wt 215 lb (97.5 kg)    BMI 35.78 kg/m    General: Alert, no acute distress Right Ankle: Inspection: No visible erythema or swelling. Longitudinal arch collapse.  No muscle atrophy.  Palpation: Tenderness along posterior tibialis tendon ROM: Limited eversion and inversion, plantarflexion secondary to pain Sensation intact, pulses present and well perfused  ASSESSMENT/PLAN:   Right foot pain Slightly improving but continues to have pain. Adherent to current therapy. -Low band exercises -Inserts with scaphoid pads  -Continue Voltaren gel 3-4 times daily -Continue Ice packs 3-4 times day -Discontinue use of boot -Avoid barefoot walking -Follow up in 4 weeks      , MD [facility]

## 2021-07-31 NOTE — Patient Instructions (Signed)
Discontinue the use of the boot but keep it with you just in case. Avoid barefoot walking/flat shoes as much as possible. Arch supports are very important - you can move these green inserts into different shoes (take the other insoles out of them if you can so it's not as tight). Voltaren gel topically up to 4 times a day. Icing 15 minutes at a time 3-4 times a day. Do home exercises at most once a day as directed. Let me know if you're struggling and want to do physical therapy. Continue working from home. Follow up with me in 4 weeks.

## 2021-08-28 ENCOUNTER — Encounter: Payer: Self-pay | Admitting: Family Medicine

## 2021-08-28 ENCOUNTER — Ambulatory Visit: Payer: 59 | Admitting: Family Medicine

## 2021-08-28 VITALS — BP 138/80 | Ht 65.0 in | Wt 215.0 lb

## 2021-08-28 DIAGNOSIS — M79671 Pain in right foot: Secondary | ICD-10-CM

## 2021-08-28 MED ORDER — NITROGLYCERIN 0.2 MG/HR TD PT24: MEDICATED_PATCH | TRANSDERMAL | 1 refills | Status: DC

## 2021-08-28 NOTE — Progress Notes (Signed)
PCP: Linda Hedges, DO  Subjective:   HPI: Patient is a 41 y.o. female here for follow up for right foot pain. She continues to have pain at the posterior tibial tendon but feels a little better. She is no longer wearing the boot but she states she puts arch support in her ugg boots. For pain relief, she takes tylenol 3 and uses a topical cream OTC. She denies increase in activity, states she does not do much for it to hurt. At this time she desires further pain relief.   Past Medical History:  Diagnosis Date   Anemia    Asthma    Erosive esophagitis    distal esophagus, with low grade bleed 03/2009   GERD (gastroesophageal reflux disease)    Headache(784.0)    History of migraine headaches    IBS (irritable bowel syndrome)    Infection    UTI    Current Outpatient Medications on File Prior to Visit  Medication Sig Dispense Refill   albuterol (VENTOLIN HFA) 108 (90 Base) MCG/ACT inhaler Inhale 2 puffs into the lungs every 6 (six) hours as needed for wheezing or shortness of breath. 1 each 1   medroxyPROGESTERone (DEPO-PROVERA) 150 MG/ML injection Inject 150 mg into the muscle every 3 (three) months.     naproxen (NAPROSYN) 500 MG tablet Take 1 tablet (500 mg total) by mouth 2 (two) times daily. 30 tablet 0   pantoprazole (PROTONIX) 40 MG tablet Take 1 tablet (40 mg total) by mouth 2 (two) times daily. Take for 14 days. 28 tablet 0   polyethylene glycol (MIRALAX / GLYCOLAX) 17 g packet Take 17 g by mouth daily. 14 each 0   [DISCONTINUED] omeprazole (PRILOSEC) 20 MG capsule Take 1 capsule (20 mg total) by mouth daily. (Patient not taking: Reported on 12/23/2018) 30 capsule 0   No current facility-administered medications on file prior to visit.    Past Surgical History:  Procedure Laterality Date   BIOPSY  01/30/2021   Procedure: BIOPSY;  Surgeon: Jerene Bears, MD;  Location: [facility] ENDOSCOPY;  Service: Gastroenterology;;   ESOPHAGOGASTRODUODENOSCOPY (EGD) WITH PROPOFOL N/A 01/30/2021    Procedure: ESOPHAGOGASTRODUODENOSCOPY (EGD) WITH PROPOFOL;  Surgeon: Jerene Bears, MD;  Location: [facility];  Service: Gastroenterology;  Laterality: N/A;   LUMBAR LAMINECTOMY/DECOMPRESSION MICRODISCECTOMY Right 01/05/2016   Procedure: Right Lumbar three-four Microdiskectomy;  Surgeon: Ashok Pall, MD;  Location: [facility];  Service: Neurosurgery;  Laterality: Right;  right   WRIST SURGERY Right     Allergies  Allergen Reactions   Ibuprofen Other (See Comments)    Ulcer flare up    Tramadol Hives    BP 138/80    Ht 5\' 5"  (1.651 m)    Wt 215 lb (97.5 kg)    BMI 35.78 kg/m   No flowsheet data found.  No flowsheet data found.      Objective:  Physical Exam:  Gen: NAD, comfortable in exam room  Right Foot: Inspection:  No obvious bony deformity.  No swelling, erythema, or bruising. Flattened arch Palpation: Tenderness to palpation posterior to medial and lateral malleolus ROM: Normal ROM of the ankle.  Strength: 4/5 strength ankle in all planes Neurovascular: N/V intact distally in the lower extremity Special tests: Negative anterior drawer. Negative squeeze.    Assessment & Plan:  1. Right foot pain 6 week follow up. 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 Some improvement but continues to have pain in those area with conservative treatment. Discussed starting nitro  patches and formal therapy as the next step. Can consider MRI imaging but likely normal imaging given normal xrays, and ultrasound.  -Start PT -Start nitro patches -Continue arch supports in supportive shoes -Follow up in 4-6 weeks; if no improvement consider MRI

## 2021-08-28 NOTE — Patient Instructions (Addendum)
Start physical therapy and do home exercises on days you don't go to therapy. Start nitro patches 1/4th patch over affected area, change daily. The arch supports are very important too - continue using these. Follow up with me in 1 month to 6 weeks. If you're still not improving as we would like we will go ahead with an MRI.

## 2021-09-25 ENCOUNTER — Ambulatory Visit (HOSPITAL_COMMUNITY)
Admission: EM | Admit: 2021-09-25 | Discharge: 2021-09-25 | Disposition: A | Discharge status: Home / Self Care | Payer: 59 | Attending: Emergency Medicine | Admitting: Emergency Medicine

## 2021-09-25 ENCOUNTER — Encounter (HOSPITAL_COMMUNITY): Payer: Self-pay | Admitting: *Deleted

## 2021-09-25 ENCOUNTER — Other Ambulatory Visit: Payer: Self-pay

## 2021-09-25 DIAGNOSIS — B349 Viral infection, unspecified: Secondary | ICD-10-CM | POA: Insufficient documentation

## 2021-09-25 DIAGNOSIS — Z20822 Contact with and (suspected) exposure to covid-19: Secondary | ICD-10-CM | POA: Diagnosis not present

## 2021-09-25 DIAGNOSIS — J029 Acute pharyngitis, unspecified: Secondary | ICD-10-CM | POA: Insufficient documentation

## 2021-09-25 LAB — POC INFLUENZA A AND B ANTIGEN (URGENT CARE ONLY)
INFLUENZA A ANTIGEN, POC: NEGATIVE
INFLUENZA B ANTIGEN, POC: NEGATIVE

## 2021-09-25 LAB — POCT RAPID STREP A, ED / UC: Streptococcus, Group A Screen (Direct): NEGATIVE

## 2021-09-25 MED ORDER — LIDOCAINE VISCOUS HCL 2 % MT SOLN
15.0000 mL | Freq: Once | OROMUCOSAL | Status: AC
Start: 2021-09-25 — End: 2021-09-25
  Administered 2021-09-25: 15 mL via OROMUCOSAL

## 2021-09-25 MED ORDER — LIDOCAINE VISCOUS HCL 2 % MT SOLN
OROMUCOSAL | Status: AC
  Filled 2021-09-25: qty 15

## 2021-09-25 MED ORDER — LIDOCAINE VISCOUS HCL 2 % MT SOLN
15.0000 mL | OROMUCOSAL | 0 refills | Status: DC | PRN
Start: 2021-09-25 — End: 2022-10-26

## 2021-09-25 NOTE — ED Triage Notes (Signed)
Pt reports sore throat,and sinus pain started today. ?

## 2021-09-25 NOTE — Discharge Instructions (Addendum)
Your symptoms and physical exam findings are concerning for a viral respiratory infection.   You were tested for both COVID-19 and influenza today.  Your influenza test today was negative.  COVID-19 test results should be available in the next 6 to 12 hours.  The result will initially be posted to your MyChart once it is complete, this typically takes 24 to 48 hours.  If there is a positive result, you will be contacted by phone with further recommendations, if any.  ?  ?Your rapid strep test today is negative.  Throat culture will be performed per our protocol.  The result of your throat culture will be posted to your MyChart once it is complete, this typically takes 3 to 5 days.  If there is a positive result, you will be contacted by phone and antibiotics will be prescribed for you.  Please discard your toothbrush and any other oral devices that you are currently using and replace them with new ones if your strep culture is positive within 24 hours of beginning antibiotics. ?  ?Please see the list below for recommended medications, dosages and frequencies to provide relief of your current symptoms:   ? ?Acetaminophen (Tylenol): This is a good fever reducer.  If your body temperature rises above 101.5 as measured with a thermometer, it is recommended that you take 1,000 mg every 8 hours until your temperature falls below 101.5, please not take more than 3,000 mg of acetaminophen either as a separate medication or as in ingredient in an over-the-counter cold/flu preparation within a 24-hour period.    ?  ?Lidocaine (Xylocaine): This is a numbing medication that can be swished for 15 seconds and swallowed.  You can use this every 3 hours while awake to relieve pain in your mouth and throat.  I have sent a prescription for this medication to your pharmacy. ? ?Ibuprofen 400 mg 3 times daily is also a good option to relieve throat pain and low-grade fever.  It is also helpful with relieving body aches.  I see in your  chart that you do not tolerate ibuprofen due to a history of ulcers.  You can certainly take ibuprofen with an over-the-counter acid reducer such as Pepcid or Zantac. ?  ?Conservative care is recommended at this time.  This includes rest, pushing clear fluids and activity as tolerated.  Warm beverages such as teas and broths versus cold beverages/popsicles and frozen sherbet/sorbet are your choice, both warm and cold are beneficial.  You may also notice that your appetite is reduced; this is okay as long as you are drinking plenty of clear fluids.  ?  ?Please follow-up within the next 3 to 5 days either with your primary care provider or urgent care if your symptoms do not resolve.  If you do not have a primary care provider, we will assist you in finding one. ?  ?Thank you for visiting urgent care today.  We appreciate the opportunity to participate in your care. ? ?

## 2021-09-25 NOTE — ED Provider Notes (Signed)
?[place] ? ? ? ?CSN: [id number] ?Arrival date & time: 09/25/21  1855 ?  ? ?HISTORY  ? ?Chief Complaint  ?Patient presents with  ? Facial Pain  ? Sore Throat  ? ?HPI ?[name] is a 41 y.o. female. Pt reports sore throat and sinus pain started today.  Patient states her son was sick with similar symptoms last week, was tested for flu, RSV and COVID and all results were negative.  Patient states that between her throat and her sinuses, her throat hurts much worse.  Patient states painful to swallow and she feels like she is also having difficulty talking.  Patient had a slightly elevated temperature on arrival with a normal heart rate, normal blood pressure and 100% oxygen saturation. ? ?The history is provided by the patient.  ?Past Medical History:  ?Diagnosis Date  ? Anemia   ? Asthma   ? Erosive esophagitis   ? distal esophagus, with low grade bleed 03/2009  ? GERD (gastroesophageal reflux disease)   ? Headache(784.0)   ? History of migraine headaches   ? IBS (irritable bowel syndrome)   ? Infection   ? UTI  ? ?Patient Active Problem List  ? Diagnosis Date Noted  ? Right foot pain 07/17/2021  ? Abscess of breast, left 02/03/2021  ? Cellulitis of left breast 02/02/2021  ? Hematemesis   ? Acute gastric ulcer   ? Acute gastritis with hemorrhage   ? Acute upper GI bleeding 01/29/2021  ? Pain of upper abdomen   ? Subluxation of extensor carpi ulnaris tendon, right, subsequent encounter 03/17/2020  ? HNP (herniated nucleus pulposus), lumbar 01/05/2016  ? Depo contraception 02/17/2015  ? Chronic back pain greater than 3 months duration 12/14/2014  ? Normal labor 06/12/2014  ? Flu vaccine need 11/03/2013  ? Hyperemesis complicating pregnancy, antepartum 11/03/2013  ? Healthcare maintenance 01/13/2013  ? Seasonal allergies 01/13/2013  ? Mesenteric adenitis 02/05/2012  ? Pes planus (flat feet) 06/11/2011  ? Reflux esophagitis 03/28/2009  ? Asthma, mild intermittent, well-controlled 03/11/2009   ? ?Past Surgical History:  ?Procedure Laterality Date  ? BIOPSY  01/30/2021  ? Procedure: BIOPSY;  Surgeon: Jerene Bears, MD;  Location: [facility] ENDOSCOPY;  Service: Gastroenterology;;  ? ESOPHAGOGASTRODUODENOSCOPY (EGD) WITH PROPOFOL N/A 01/30/2021  ? Procedure: ESOPHAGOGASTRODUODENOSCOPY (EGD) WITH PROPOFOL;  Surgeon: Jerene Bears, MD;  Location: [facility] ENDOSCOPY;  Service: Gastroenterology;  Laterality: N/A;  ? LUMBAR LAMINECTOMY/DECOMPRESSION MICRODISCECTOMY Right 01/05/2016  ? Procedure: Right Lumbar three-four Microdiskectomy;  Surgeon: Ashok Pall, MD;  Location: [place] NEURO ORS;  Service: Neurosurgery;  Laterality: Right;  right  ? WRIST SURGERY Right   ? ?OB History   ? ? Gravida  ?3  ? Para  ?3  ? Term  ?3  ? Preterm  ?   ? AB  ?   ? Living  ?3  ?  ? ? SAB  ?   ? IAB  ?   ? Ectopic  ?   ? Multiple  ?0  ? Live Births  ?3  ?   ?  ?  ? ?Home Medications   ? ?Prior to Admission medications   ?Medication Sig Start Date End Date Taking? Authorizing Provider  ?albuterol (VENTOLIN HFA) 108 (90 Base) MCG/ACT inhaler Inhale 2 puffs into the lungs every 6 (six) hours as needed for wheezing or shortness of breath. 02/05/21   Barb Merino, MD  ?medroxyPROGESTERone (DEPO-PROVERA) 150 MG/ML injection Inject 150 mg into the muscle every 3 (three) months. 12/06/20  [provider]  ?naproxen (NAPROSYN) 500 MG tablet Take 1 tablet (500 mg total) by mouth 2 (two) times daily. 07/17/21   Horton, Barbette Hair, MD  ?nitroGLYCERIN (NITRODUR - DOSED IN MG/24 HR) 0.2 mg/hr patch Apply 1/4th patch to affected ankle, change daily 08/28/21   Hudnall, Sharyn Lull, MD  ?pantoprazole (PROTONIX) 40 MG tablet Take 1 tablet (40 mg total) by mouth 2 (two) times daily. Take for 14 days. 04/21/21   Pyrtle, Lajuan Lines, MD  ?polyethylene glycol (MIRALAX / GLYCOLAX) 17 g packet Take 17 g by mouth daily. 02/06/21   Barb Merino, MD  ?omeprazole (PRILOSEC) 20 MG capsule Take 1 capsule (20 mg total) by mouth daily. ?Patient not taking: Reported on 12/23/2018 07/18/18  03/16/19  Rodriguez-Southworth, Sunday Spillers, PA-C  ? ?Family History ?Family History  ?Problem Relation Age of Onset  ? Migraines Mother   ? Asthma Mother   ? Migraines Father   ? Heart disease Father   ? Asthma Daughter   ? Cancer Maternal Grandmother   ? ?Social History ?Social History  ? ?Tobacco Use  ? Smoking status: Light Smoker  ? Smokeless tobacco: Never  ?Vaping Use  ? Vaping Use: Never used  ?Substance Use Topics  ? Alcohol use: No  ?  Comment: occasional on holidays  ? Drug use: No  ? ?Allergies   ?Ibuprofen and Tramadol ? ?Review of Systems ?Review of Systems ?Pertinent findings noted in history of present illness.  ? ?Physical Exam ?Triage Vital Signs ?ED Triage Vitals  ?Enc Vitals Group  ?   BP 04/28/21 0827 (!) 147/82  ?   Pulse Rate 04/28/21 0827 72  ?   Resp 04/28/21 0827 18  ?   Temp 04/28/21 0827 98.3 ?F (36.8 ?C)  ?   Temp Source 04/28/21 0827 Oral  ?   SpO2 04/28/21 0827 98 %  ?   Weight --   ?   Height --   ?   Head Circumference --   ?   Peak Flow --   ?   Pain Score 04/28/21 0826 5  ?   Pain Loc --   ?   Pain Edu? --   ?   Excl. in [place]? --   ?No data found. ? ?Updated Vital Signs ?BP 121/81   Pulse 78   Temp 99.5 ?F (37.5 ?C)   Resp 18   SpO2 100%  ? ?Physical Exam ?Constitutional:   ?   General: She is not in acute distress. ?   Appearance: She is well-developed. She is ill-appearing. She is not toxic-appearing.  ?HENT:  ?   Head: Normocephalic and atraumatic.  ?   Salivary Glands: Right salivary gland is not diffusely enlarged or tender. Left salivary gland is not diffusely enlarged or tender.  ?   Right Ear: Hearing and external ear normal.  ?   Left Ear: Hearing and external ear normal.  ?   Ears:  ?   Comments: Bilateral EACs with mild erythema, bilateral TMs are normal ?   Nose: No mucosal edema, congestion or rhinorrhea.  ?   Right Turbinates: Not enlarged, swollen or pale.  ?   Left Turbinates: Not enlarged or swollen.  ?   Right Sinus: No maxillary sinus tenderness or frontal sinus  tenderness.  ?   Left Sinus: No maxillary sinus tenderness or frontal sinus tenderness.  ?   Mouth/Throat:  ?   Lips: Pink. No lesions.  ?   Mouth: Mucous membranes are moist.  No oral lesions or angioedema.  ?   Dentition: No gingival swelling.  ?   Tongue: No lesions.  ?   Palate: No mass.  ?   Pharynx: Uvula midline. Pharyngeal swelling and posterior oropharyngeal erythema present. No oropharyngeal exudate or uvula swelling.  ?   Tonsils: No tonsillar exudate. 1+ on the right. 1+ on the left.  ?Eyes:  ?   Extraocular Movements: Extraocular movements intact.  ?   Conjunctiva/sclera: Conjunctivae normal.  ?   Pupils: Pupils are equal, round, and reactive to light.  ?Neck:  ?   Thyroid: No thyroid mass, thyromegaly or thyroid tenderness.  ?   Trachea: Tracheal tenderness present. No abnormal tracheal secretions or tracheal deviation.  ?   Comments: Voice is muffled ?Cardiovascular:  ?   Rate and Rhythm: Normal rate and regular rhythm.  ?   Pulses: Normal pulses.  ?   Heart sounds: Normal heart sounds, S1 normal and S2 normal. No murmur heard. ?  No friction rub. No gallop.  ?Pulmonary:  ?   Effort: Pulmonary effort is normal. No accessory muscle usage, prolonged expiration, respiratory distress or retractions.  ?   Breath sounds: No stridor, decreased air movement or transmitted upper airway sounds. No decreased breath sounds, wheezing, rhonchi or rales.  ?Abdominal:  ?   General: Bowel sounds are normal.  ?   Palpations: Abdomen is soft.  ?   Tenderness: There is generalized abdominal tenderness. There is no right CVA tenderness, left CVA tenderness or rebound. Negative signs include Murphy's sign.  ?   Hernia: No hernia is present.  ?Musculoskeletal:     ?   General: No tenderness. Normal range of motion.  ?   Cervical back: Full passive range of motion without pain, normal range of motion and neck supple.  ?   Right lower leg: No edema.  ?   Left lower leg: No edema.  ?Lymphadenopathy:  ?   Cervical: Cervical  adenopathy present.  ?   Right cervical: Superficial cervical adenopathy present.  ?   Left cervical: Superficial cervical adenopathy present.  ?Skin: ?   General: Skin is warm and dry.  ?   Findings: No erythema, lesi

## 2021-09-26 LAB — SARS CORONAVIRUS 2 (TAT 6-24 HRS): SARS Coronavirus 2: NEGATIVE

## 2021-09-28 LAB — CULTURE, GROUP A STREP (THRC)

## 2021-10-16 ENCOUNTER — Ambulatory Visit: Payer: 59 | Admitting: Family Medicine

## 2021-11-02 ENCOUNTER — Other Ambulatory Visit: Payer: Self-pay | Admitting: Family Medicine

## 2022-09-22 ENCOUNTER — Emergency Department (HOSPITAL_COMMUNITY)
Admission: EM | Admit: 2022-09-22 | Discharge: 2022-09-22 | Disposition: A | Discharge status: Home / Self Care | Payer: 59 | Attending: Emergency Medicine | Admitting: Emergency Medicine

## 2022-09-22 ENCOUNTER — Emergency Department (HOSPITAL_COMMUNITY): Payer: 59

## 2022-09-22 ENCOUNTER — Other Ambulatory Visit: Payer: Self-pay

## 2022-09-22 ENCOUNTER — Encounter (HOSPITAL_COMMUNITY): Payer: Self-pay | Admitting: Emergency Medicine

## 2022-09-22 DIAGNOSIS — W231XXA Caught, crushed, jammed, or pinched between stationary objects, initial encounter: Secondary | ICD-10-CM | POA: Insufficient documentation

## 2022-09-22 DIAGNOSIS — S60931A Unspecified superficial injury of right thumb, initial encounter: Secondary | ICD-10-CM | POA: Diagnosis present

## 2022-09-22 DIAGNOSIS — J45909 Unspecified asthma, uncomplicated: Secondary | ICD-10-CM | POA: Insufficient documentation

## 2022-09-22 DIAGNOSIS — M79644 Pain in right finger(s): Secondary | ICD-10-CM

## 2022-09-22 MED ORDER — HYDROCODONE-ACETAMINOPHEN 5-325 MG PO TABS: 1.0000 | ORAL_TABLET | Freq: Four times a day (QID) | ORAL | 0 refills | Status: DC | PRN

## 2022-09-22 MED ORDER — ACETAMINOPHEN 325 MG PO TABS
650.0000 mg | ORAL_TABLET | Freq: Once | ORAL | Status: AC
  Administered 2022-09-22: 650 mg via ORAL
  Filled 2022-09-22: qty 2

## 2022-09-22 NOTE — ED Provider Notes (Signed)
[facility] Provider Note   CSN: [id number] Arrival date & time: 09/22/22  0414     History  Chief Complaint  Patient presents with   Hand Pain    [name] is a 42 y.o. female with medical history anemia, asthma, GERD, headaches, right wrist tendinitis.  Patient presents to ED for evaluation of right thumb pain.  Patient states yesterday her son slammed the car door on her right thumb.  Patient reports she had immediate pain at that time.  Patient states that over the course of the evening her pain in her thumb progressively worsened.  Patient states she took a Tylenol prior to bed and was able to fall asleep however the pain in her thumb woke her up from her sleep.  Patient denies numbness or tingling to the thumb.     Hand Pain       Home Medications Prior to Admission medications   Medication Sig Start Date End Date Taking? Authorizing Provider  HYDROcodone-acetaminophen (NORCO/VICODIN) 5-325 MG tablet Take 1 tablet by mouth every 6 (six) hours as needed for severe pain. 09/22/22  Yes Azucena Cecil, PA-C  albuterol (VENTOLIN HFA) 108 (90 Base) MCG/ACT inhaler Inhale 2 puffs into the lungs every 6 (six) hours as needed for wheezing or shortness of breath. 02/05/21   Barb Merino, MD  lidocaine (XYLOCAINE) 2 % solution Use as directed 15 mLs in the mouth or throat every 3 (three) hours as needed for mouth pain (Sore throat). 09/25/21   Lynden Oxford Scales, PA-C  medroxyPROGESTERone (DEPO-PROVERA) 150 MG/ML injection Inject 150 mg into the muscle every 3 (three) months. 12/06/20   [provider]  naproxen (NAPROSYN) 500 MG tablet Take 1 tablet (500 mg total) by mouth 2 (two) times daily. 07/17/21   Horton, Barbette Hair, MD  nitroGLYCERIN (NITRODUR - DOSED IN MG/24 HR) 0.2 mg/hr patch APPLY 1/4TH PATCH TO AFFECTED ANKLE, CHANGE DAILY 11/02/21   Hudnall, Sharyn Lull, MD  pantoprazole (PROTONIX) 40 MG tablet Take 1 tablet (40  mg total) by mouth 2 (two) times daily. Take for 14 days. 04/21/21   Pyrtle, Lajuan Lines, MD  polyethylene glycol (MIRALAX / GLYCOLAX) 17 g packet Take 17 g by mouth daily. 02/06/21   Barb Merino, MD  omeprazole (PRILOSEC) 20 MG capsule Take 1 capsule (20 mg total) by mouth daily. Patient not taking: Reported on 12/23/2018 07/18/18 03/16/19  Rodriguez-Southworth, Sunday Spillers, PA-C      Allergies    Ibuprofen and Tramadol    Review of Systems   Review of Systems  Musculoskeletal:  Positive for arthralgias.  All other systems reviewed and are negative.   Physical Exam Updated Vital Signs BP (!) 160/108 (BP Location: Right Arm)   Pulse 86   Temp 98 F (36.7 C) (Oral)   Resp 19   Ht 5\' 6"  (1.676 m)   Wt 99.8 kg   SpO2 100%   BMI 35.51 kg/m  Physical Exam Vitals and nursing note reviewed.  Constitutional:      General: She is not in acute distress.    Appearance: Normal appearance. She is not ill-appearing, toxic-appearing or diaphoretic.  HENT:     Head: Normocephalic and atraumatic.     Nose: Nose normal.     Mouth/Throat:     Mouth: Mucous membranes are moist.     Pharynx: Oropharynx is clear.  Eyes:     Extraocular Movements: Extraocular movements intact.     Conjunctiva/sclera: Conjunctivae  normal.     Pupils: Pupils are equal, round, and reactive to light.  Cardiovascular:     Rate and Rhythm: Normal rate and regular rhythm.  Pulmonary:     Effort: Pulmonary effort is normal.     Breath sounds: Normal breath sounds. No wheezing.  Abdominal:     General: Abdomen is flat. Bowel sounds are normal.     Palpations: Abdomen is soft.     Tenderness: There is no abdominal tenderness.  Musculoskeletal:     Cervical back: Normal range of motion and neck supple.     Comments: No overlying skin change patient right thumb.  No obvious deformity.  There is snuffbox tenderness.    Skin:    General: Skin is warm and dry.     Capillary Refill: Capillary refill takes less than 2 seconds.   Neurological:     Mental Status: She is alert and oriented to person, place, and time.     ED Results / Procedures / Treatments   Labs (all labs ordered are listed, but only abnormal results are displayed) Labs Reviewed - No data to display  EKG None  Radiology DG Hand Complete Right  Result Date: 09/22/2022 CLINICAL DATA:  Hand hit by car door. Most painful at the first digit. EXAM: RIGHT HAND - COMPLETE 3+ VIEW COMPARISON:  None Available. FINDINGS: There is no evidence of fracture or dislocation. There is no evidence of arthropathy or other focal bone abnormality. Soft tissues are unremarkable. IMPRESSION: Negative. Electronically Signed   By: Misty Stanley M.D.   On: 09/22/2022 05:13    Procedures Procedures   Medications Ordered in ED Medications  acetaminophen (TYLENOL) tablet 650 mg (650 mg Oral Given 09/22/22 0545)    ED Course/ Medical Decision Making/ A&P  Medical Decision Making Amount and/or Complexity of Data Reviewed Radiology: ordered.   42 year old female presents ED for evaluation of right thumb pain.  Please see HPI for further details.  On examination the patient is afebrile and nontachycardic.  Patient right thumb has no overlying skin change, no obvious deformity.  There is tenderness about her snuffbox.  Patient has passive range of motion to her right thumb however has decreased active range of motion secondary to pain.  Plain film imaging of patient right thumb shows no obvious fractures or dislocations.  Due to snuffbox tenderness, patient will be placed in thumb spica splint and referred to hand specialist.  Patient will be advised to continue taking ibuprofen and Tylenol for pain.  The patient was provided return precautions and she voiced understanding.  The patient had all of her questions answered to her satisfaction.  The patient is stable for discharge.   Final Clinical Impression(s) / ED Diagnoses Final diagnoses:  Pain of right thumb     Rx / DC Orders ED Discharge Orders          Ordered    HYDROcodone-acetaminophen (NORCO/VICODIN) 5-325 MG tablet  Every 6 hours PRN        09/22/22 0607              Azucena Cecil, PA-C 09/22/22 [id number]    Elgie Congo, MD 09/22/22 1700

## 2022-09-22 NOTE — Progress Notes (Signed)
Orthopedic Tech Progress Note Patient Details:  [name] [birth year] [id number]  Ortho Devices Type of Ortho Device: Thumb velcro splint Ortho Device/Splint Location: RUE Ortho Device/Splint Interventions: Ordered, Application, Adjustment   Post Interventions Patient Tolerated: Fair Instructions Provided: Care of device  [name] 09/22/2022, 6:22 AM

## 2022-09-22 NOTE — Discharge Instructions (Addendum)
Please return to the ED with any new or worsening signs or symptoms Please follow-up with the orthopedic specialist I referred you to.  Please call and make an appointment to be seen. You may continue taking ibuprofen or Tylenol for pain at home.  You may also continue icing your thumb.  Please pick up pain medication have sent your pharmacy.  Please do not drive or operate heavy machinery under the influence of this medication.

## 2022-09-22 NOTE — ED Triage Notes (Signed)
Pt reports she was woken up by the pain in her right hand. She thinks she shut her hand in the car door but "I didn't think it hurt that bad."  Pt states she did take an extra strength tylenol before bed.  Pt has good pulses in hand and is able to move all her fingers but hand.

## 2022-10-05 ENCOUNTER — Ambulatory Visit (HOSPITAL_COMMUNITY): Payer: Self-pay | Admitting: Orthopedic Surgery

## 2022-10-14 ENCOUNTER — Ambulatory Visit (INDEPENDENT_AMBULATORY_CARE_PROVIDER_SITE_OTHER): Payer: 59

## 2022-10-14 ENCOUNTER — Encounter (HOSPITAL_COMMUNITY): Payer: Self-pay

## 2022-10-14 ENCOUNTER — Ambulatory Visit (HOSPITAL_COMMUNITY)
Admission: EM | Admit: 2022-10-14 | Discharge: 2022-10-14 | Disposition: A | Discharge status: Home / Self Care | Payer: 59 | Attending: Emergency Medicine | Admitting: Emergency Medicine

## 2022-10-14 DIAGNOSIS — M51369 Other intervertebral disc degeneration, lumbar region without mention of lumbar back pain or lower extremity pain: Secondary | ICD-10-CM

## 2022-10-14 DIAGNOSIS — M545 Low back pain, unspecified: Secondary | ICD-10-CM

## 2022-10-14 DIAGNOSIS — M7918 Myalgia, other site: Secondary | ICD-10-CM

## 2022-10-14 DIAGNOSIS — M5136 Other intervertebral disc degeneration, lumbar region: Secondary | ICD-10-CM

## 2022-10-14 MED ORDER — BACLOFEN 10 MG PO TABS: 10.0000 mg | ORAL_TABLET | Freq: Three times a day (TID) | ORAL | 0 refills | Status: AC | PRN
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
## 2022-10-14 NOTE — Discharge Instructions (Addendum)
Your lumbar xray was negative for acute findings. Take home meds as directed, prescribed baclofen for muscle spasm(take as directed).  Follow-up with your surgeon regarding upcoming cervical fusion-call tomorrow.
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
## 2022-10-15 ENCOUNTER — Encounter: Payer: Self-pay | Admitting: *Deleted

## 2022-10-17 NOTE — Progress Notes (Signed)
Surgical Instructions    Your procedure is scheduled on Thursday April 25th.  Report to [facility] Main Entrance "A" at 5:30 A.M., then check in with the Admitting office.  Call this number if you have problems the morning of surgery:  [phone]   If you have any questions prior to your surgery date call [phone]: Open Monday-Friday 8am-4pm If you experience any cold or flu symptoms such as cough, fever, chills, shortness of breath, etc. between now and your scheduled surgery, please notify us at the above number     Remember:  Do not eat after midnight the night before your surgery  You may drink clear liquids until 4:30am the morning of your surgery.   Clear liquids allowed are: Water, Non-Citrus Juices (without pulp), Carbonated Beverages, Clear Tea, Black Coffee ONLY (NO MILK, CREAM OR POWDERED CREAMER of any kind), and Gatorade    Take these medicines the morning of surgery with A SIP OF WATER: IF NEEDED  acetaminophen (TYLENOL) 500 MG tablet  acetaminophen-codeine (TYLENOL #3) 300-30 MG tablet  albuterol (VENTOLIN HFA) 108 (90 Base) MCG/ACT inhaler -please bring with you baclofen (LIORESAL) 10 MG tablet     As of today, STOP taking any Aspirin (unless otherwise instructed by your surgeon) Aleve, Naproxen, Ibuprofen, Motrin, Advil, Goody's, BC's, all herbal medications, fish oil, and all vitamins.           Do not wear jewelry or makeup. Do not wear lotions, powders, perfumes or deodorant. Do not shave 48 hours prior to surgery.  Do not bring valuables to the hospital. Do not wear nail polish, gel polish, artificial nails, or any other type of covering on natural nails (fingers and toes) If you have artificial nails or gel coating that need to be removed by a nail salon, please have this removed prior to surgery. Artificial nails or gel coating may interfere with anesthesia's ability to adequately monitor your vital signs.  [facility] is not responsible for any  belongings or valuables.    Do NOT Smoke (Tobacco/Vaping)  24 hours prior to your procedure  If you use a CPAP at night, you may bring your mask for your overnight stay.   Contacts, glasses, hearing aids, dentures or partials may not be worn into surgery, please bring cases for these belongings   For patients admitted to the hospital, discharge time will be determined by your treatment team.   Patients discharged the day of surgery will not be allowed to drive home, and someone needs to stay with them for 24 hours.   SURGICAL WAITING ROOM VISITATION Patients having surgery or a procedure may have no more than 2 support people in the waiting area - these visitors may rotate.   Children under the age of 60 must have an adult with them who is not the patient. If the patient needs to stay at the hospital during part of their recovery, the visitor guidelines for inpatient rooms apply. Pre-op nurse will coordinate an appropriate time for 1 support person to accompany patient in pre-op.  This support person may not rotate.   Please refer to [email or web address] for the visitor guidelines for Inpatients (after your surgery is over and you are in a regular room).    Special instructions:    Oral Hygiene is also important to reduce your risk of infection.  Remember - BRUSH YOUR TEETH THE MORNING OF SURGERY WITH YOUR REGULAR TOOTHPASTE   [facility]- Preparing For Surgery  Before surgery, you can play an  important role. Because skin is not sterile, your skin needs to be as free of germs as possible. You can reduce the number of germs on your skin by washing with CHG (chlorahexidine gluconate) Soap before surgery.  CHG is an antiseptic cleaner which kills germs and bonds with the skin to continue killing germs even after washing.     Please do not use if you have an allergy to CHG or antibacterial soaps. If your skin becomes reddened/irritated stop  using the CHG.  Do not shave (including legs and underarms) for at least 48 hours prior to first CHG shower. It is OK to shave your face.  Please follow your handout on Pre-operative CHG Bath Instructions carefully.    Day of Surgery:  Take a shower with CHG soap. Wear Clean/Comfortable clothing the morning of surgery Do not apply any deodorants/lotions.   Remember to brush your teeth WITH YOUR REGULAR TOOTHPASTE.    If you received a COVID test during your pre-op visit, it is requested that you wear a mask when out in public, stay away from anyone that may not be feeling well, and notify your surgeon if you develop symptoms. If you have been in contact with anyone that has tested positive in the last 10 days, please notify your surgeon.    Please read over the following fact sheets that you were given.

## 2022-10-18 ENCOUNTER — Encounter (HOSPITAL_COMMUNITY)
Admission: RE | Admit: 2022-10-18 | Discharge: 2022-10-18 | Disposition: A | Discharge status: Home / Self Care | Payer: 59 | Source: Ambulatory Visit | Attending: Orthopedic Surgery | Admitting: Orthopedic Surgery

## 2022-10-18 ENCOUNTER — Encounter (HOSPITAL_COMMUNITY): Payer: Self-pay

## 2022-10-18 ENCOUNTER — Other Ambulatory Visit: Payer: Self-pay

## 2022-10-18 VITALS — BP 142/83 | HR 82 | Temp 97.9°F | Ht 66.0 in | Wt 226.0 lb

## 2022-10-18 DIAGNOSIS — Z01818 Encounter for other preprocedural examination: Secondary | ICD-10-CM

## 2022-10-18 DIAGNOSIS — Z01812 Encounter for preprocedural laboratory examination: Secondary | ICD-10-CM | POA: Insufficient documentation

## 2022-10-18 HISTORY — DX: Anxiety disorder, unspecified: F41.9

## 2022-10-18 HISTORY — DX: Unspecified osteoarthritis, unspecified site: M19.90

## 2022-10-18 LAB — SURGICAL PCR SCREEN
MRSA, PCR: NEGATIVE
Staphylococcus aureus: NEGATIVE

## 2022-10-18 NOTE — Progress Notes (Signed)
PCP - Frederich Balding PA-C Cardiologist - Denies  PPM/ICD - Denies  Chest x-ray - N/A EKG - 10/04/22 - CE (requested) Stress Test - Denies ECHO - Denies Cardiac Cath - Denies  Sleep Study - Denies  Diabetes: Denies  Blood Thinner Instructions: N/A Aspirin Instructions: N/A  ERAS Protcol - Yes PRE-SURGERY Ensure or G2- No  COVID TEST- N/A   Anesthesia review: No  Patient denies shortness of breath, fever, cough and chest pain at PAT appointment   All instructions explained to the patient, with a verbal understanding of the material. Patient agrees to go over the instructions while at home for a better understanding. Patient also instructed to self quarantine after being tested for COVID-19. The opportunity to ask questions was provided.

## 2022-10-25 ENCOUNTER — Other Ambulatory Visit: Payer: Self-pay

## 2022-10-25 ENCOUNTER — Encounter (HOSPITAL_COMMUNITY)
Admission: RE | Disposition: A | Discharge status: Home / Self Care | Payer: Self-pay | Source: Home / Self Care | Attending: Orthopedic Surgery

## 2022-10-25 ENCOUNTER — Ambulatory Visit (HOSPITAL_BASED_OUTPATIENT_CLINIC_OR_DEPARTMENT_OTHER): Payer: 59 | Admitting: Certified Registered Nurse Anesthetist

## 2022-10-25 ENCOUNTER — Ambulatory Visit (HOSPITAL_COMMUNITY): Payer: 59

## 2022-10-25 ENCOUNTER — Ambulatory Visit (HOSPITAL_COMMUNITY): Payer: 59 | Admitting: Physician Assistant

## 2022-10-25 ENCOUNTER — Encounter (HOSPITAL_COMMUNITY): Payer: Self-pay | Admitting: Orthopedic Surgery

## 2022-10-25 ENCOUNTER — Ambulatory Visit (HOSPITAL_COMMUNITY)
Admission: RE | Admit: 2022-10-25 | Discharge: 2022-10-26 | Disposition: A | Discharge status: Home / Self Care | Payer: 59 | Attending: Orthopedic Surgery | Admitting: Orthopedic Surgery

## 2022-10-25 DIAGNOSIS — M50121 Cervical disc disorder at C4-C5 level with radiculopathy: Secondary | ICD-10-CM | POA: Insufficient documentation

## 2022-10-25 DIAGNOSIS — M4802 Spinal stenosis, cervical region: Secondary | ICD-10-CM | POA: Insufficient documentation

## 2022-10-25 DIAGNOSIS — K279 Peptic ulcer, site unspecified, unspecified as acute or chronic, without hemorrhage or perforation: Secondary | ICD-10-CM | POA: Insufficient documentation

## 2022-10-25 DIAGNOSIS — M4722 Other spondylosis with radiculopathy, cervical region: Secondary | ICD-10-CM | POA: Insufficient documentation

## 2022-10-25 DIAGNOSIS — M5020 Other cervical disc displacement, unspecified cervical region: Secondary | ICD-10-CM | POA: Diagnosis present

## 2022-10-25 DIAGNOSIS — F17210 Nicotine dependence, cigarettes, uncomplicated: Secondary | ICD-10-CM

## 2022-10-25 DIAGNOSIS — M1990 Unspecified osteoarthritis, unspecified site: Secondary | ICD-10-CM | POA: Insufficient documentation

## 2022-10-25 DIAGNOSIS — R519 Headache, unspecified: Secondary | ICD-10-CM | POA: Diagnosis not present

## 2022-10-25 DIAGNOSIS — K219 Gastro-esophageal reflux disease without esophagitis: Secondary | ICD-10-CM | POA: Diagnosis not present

## 2022-10-25 DIAGNOSIS — M50122 Cervical disc disorder at C5-C6 level with radiculopathy: Secondary | ICD-10-CM | POA: Diagnosis not present

## 2022-10-25 DIAGNOSIS — F419 Anxiety disorder, unspecified: Secondary | ICD-10-CM

## 2022-10-25 DIAGNOSIS — M2578 Osteophyte, vertebrae: Secondary | ICD-10-CM | POA: Insufficient documentation

## 2022-10-25 DIAGNOSIS — F17200 Nicotine dependence, unspecified, uncomplicated: Secondary | ICD-10-CM | POA: Diagnosis not present

## 2022-10-25 DIAGNOSIS — J45909 Unspecified asthma, uncomplicated: Secondary | ICD-10-CM | POA: Diagnosis not present

## 2022-10-25 DIAGNOSIS — Z01818 Encounter for other preprocedural examination: Secondary | ICD-10-CM

## 2022-10-25 HISTORY — PX: ANTERIOR CERVICAL DECOMP/DISCECTOMY FUSION: SHX1161

## 2022-10-25 LAB — POCT PREGNANCY, URINE: Preg Test, Ur: NEGATIVE

## 2022-10-25 SURGERY — ANTERIOR CERVICAL DECOMPRESSION/DISCECTOMY FUSION 2 LEVELS
Anesthesia: General

## 2022-10-25 MED ORDER — ACETAMINOPHEN 10 MG/ML IV SOLN: 1000.0000 mg | Freq: Once | INTRAVENOUS | Status: DC | PRN

## 2022-10-25 MED ORDER — HYDROMORPHONE HCL 1 MG/ML IJ SOLN
INTRAMUSCULAR | Status: AC
  Filled 2022-10-25: qty 0.5

## 2022-10-25 MED ORDER — TRANEXAMIC ACID-NACL 1000-0.7 MG/100ML-% IV SOLN
1000.0000 mg | INTRAVENOUS | Status: AC
  Administered 2022-10-25: 1000 mg via INTRAVENOUS

## 2022-10-25 MED ORDER — CHLORHEXIDINE GLUCONATE 0.12 % MT SOLN: 15.0000 mL | Freq: Once | OROMUCOSAL | Status: AC

## 2022-10-25 MED ORDER — ACETAMINOPHEN 500 MG PO TABS: 1000.0000 mg | ORAL_TABLET | Freq: Once | ORAL | Status: DC | PRN

## 2022-10-25 MED ORDER — HYDROMORPHONE HCL 1 MG/ML IJ SOLN
INTRAMUSCULAR | Status: DC | PRN
  Administered 2022-10-25 (×2): .5 mg via INTRAVENOUS

## 2022-10-25 MED ORDER — MIDAZOLAM HCL 2 MG/2ML IJ SOLN
INTRAMUSCULAR | Status: DC | PRN
  Administered 2022-10-25: 2 mg via INTRAVENOUS

## 2022-10-25 MED ORDER — DEXAMETHASONE SODIUM PHOSPHATE 4 MG/ML IJ SOLN
4.0000 mg | Freq: Four times a day (QID) | INTRAMUSCULAR | Status: DC
  Administered 2022-10-25 (×2): 4 mg via INTRAVENOUS
  Filled 2022-10-25 (×2): qty 1

## 2022-10-25 MED ORDER — SUGAMMADEX SODIUM 200 MG/2ML IV SOLN
INTRAVENOUS | Status: DC | PRN
  Administered 2022-10-25: 200 mg via INTRAVENOUS

## 2022-10-25 MED ORDER — ROCURONIUM BROMIDE 10 MG/ML (PF) SYRINGE
PREFILLED_SYRINGE | INTRAVENOUS | Status: AC
  Filled 2022-10-25: qty 20

## 2022-10-25 MED ORDER — ONDANSETRON HCL 4 MG/2ML IJ SOLN
INTRAMUSCULAR | Status: AC
  Filled 2022-10-25: qty 4

## 2022-10-25 MED ORDER — OXYCODONE HCL 5 MG PO TABS
10.0000 mg | ORAL_TABLET | ORAL | Status: DC | PRN
  Administered 2022-10-25 – 2022-10-26 (×6): 10 mg via ORAL
  Filled 2022-10-25 (×6): qty 2

## 2022-10-25 MED ORDER — THROMBIN 20000 UNITS EX SOLR
CUTANEOUS | Status: AC
  Filled 2022-10-25: qty 20000

## 2022-10-25 MED ORDER — FENTANYL CITRATE (PF) 100 MCG/2ML IJ SOLN
25.0000 ug | INTRAMUSCULAR | Status: DC | PRN
  Administered 2022-10-25 (×2): 50 ug via INTRAVENOUS

## 2022-10-25 MED ORDER — ACETAMINOPHEN 325 MG PO TABS
650.0000 mg | ORAL_TABLET | ORAL | Status: DC | PRN
  Administered 2022-10-25 – 2022-10-26 (×3): 650 mg via ORAL
  Filled 2022-10-25 (×3): qty 2

## 2022-10-25 MED ORDER — CEFAZOLIN SODIUM-DEXTROSE 2-4 GM/100ML-% IV SOLN
INTRAVENOUS | Status: AC
  Filled 2022-10-25: qty 100

## 2022-10-25 MED ORDER — OXYCODONE-ACETAMINOPHEN 10-325 MG PO TABS: 1.0000 | ORAL_TABLET | Freq: Four times a day (QID) | ORAL | 0 refills | Status: AC | PRN

## 2022-10-25 MED ORDER — ONDANSETRON HCL 4 MG/2ML IJ SOLN
4.0000 mg | Freq: Four times a day (QID) | INTRAMUSCULAR | Status: DC | PRN
  Administered 2022-10-25: 4 mg via INTRAVENOUS
  Filled 2022-10-25: qty 2

## 2022-10-25 MED ORDER — THROMBIN 20000 UNITS EX SOLR
CUTANEOUS | Status: DC | PRN
  Administered 2022-10-25: 20 mL via TOPICAL

## 2022-10-25 MED ORDER — LACTATED RINGERS IV SOLN: INTRAVENOUS | Status: DC

## 2022-10-25 MED ORDER — LIDOCAINE 2% (20 MG/ML) 5 ML SYRINGE
INTRAMUSCULAR | Status: DC | PRN
  Administered 2022-10-25: 60 mg via INTRAVENOUS

## 2022-10-25 MED ORDER — ONDANSETRON HCL 4 MG/2ML IJ SOLN
INTRAMUSCULAR | Status: DC | PRN
  Administered 2022-10-25: 4 mg via INTRAVENOUS

## 2022-10-25 MED ORDER — MENTHOL 3 MG MT LOZG
1.0000 | LOZENGE | OROMUCOSAL | Status: DC | PRN
  Filled 2022-10-25: qty 9

## 2022-10-25 MED ORDER — OXYCODONE HCL 5 MG/5ML PO SOLN
5.0000 mg | Freq: Once | ORAL | Status: AC | PRN
  Administered 2022-10-25: 5 mg via ORAL

## 2022-10-25 MED ORDER — DEXMEDETOMIDINE HCL IN NACL 80 MCG/20ML IV SOLN
INTRAVENOUS | Status: DC | PRN
  Administered 2022-10-25 (×2): 8 ug via INTRAVENOUS
  Administered 2022-10-25: 4 ug via INTRAVENOUS
  Administered 2022-10-25: 8 ug via INTRAVENOUS
  Administered 2022-10-25: 4 ug via INTRAVENOUS
  Administered 2022-10-25: 8 ug via INTRAVENOUS

## 2022-10-25 MED ORDER — ALBUTEROL SULFATE (2.5 MG/3ML) 0.083% IN NEBU: 2.5000 mg | INHALATION_SOLUTION | Freq: Four times a day (QID) | RESPIRATORY_TRACT | Status: DC | PRN

## 2022-10-25 MED ORDER — METHOCARBAMOL 500 MG PO TABS
500.0000 mg | ORAL_TABLET | Freq: Four times a day (QID) | ORAL | Status: DC | PRN
  Administered 2022-10-25 – 2022-10-26 (×3): 500 mg via ORAL
  Filled 2022-10-25 (×3): qty 1

## 2022-10-25 MED ORDER — SURGIFLO WITH THROMBIN (HEMOSTATIC MATRIX KIT) OPTIME
TOPICAL | Status: DC | PRN
  Administered 2022-10-25: 1 via TOPICAL

## 2022-10-25 MED ORDER — ORAL CARE MOUTH RINSE: 15.0000 mL | Freq: Once | OROMUCOSAL | Status: AC

## 2022-10-25 MED ORDER — ONDANSETRON HCL 4 MG PO TABS
4.0000 mg | ORAL_TABLET | Freq: Four times a day (QID) | ORAL | Status: DC | PRN
  Administered 2022-10-25: 4 mg via ORAL
  Filled 2022-10-25: qty 1

## 2022-10-25 MED ORDER — METHOCARBAMOL 1000 MG/10ML IJ SOLN
500.0000 mg | Freq: Four times a day (QID) | INTRAVENOUS | Status: DC | PRN
  Filled 2022-10-25: qty 5

## 2022-10-25 MED ORDER — ACETAMINOPHEN 160 MG/5ML PO SOLN: 1000.0000 mg | Freq: Once | ORAL | Status: DC | PRN

## 2022-10-25 MED ORDER — FENTANYL CITRATE (PF) 250 MCG/5ML IJ SOLN
INTRAMUSCULAR | Status: AC
  Filled 2022-10-25: qty 5

## 2022-10-25 MED ORDER — METHOCARBAMOL 500 MG PO TABS: 500.0000 mg | ORAL_TABLET | Freq: Three times a day (TID) | ORAL | 0 refills | Status: AC | PRN

## 2022-10-25 MED ORDER — CEFAZOLIN SODIUM-DEXTROSE 1-4 GM/50ML-% IV SOLN
1.0000 g | Freq: Three times a day (TID) | INTRAVENOUS | Status: AC
  Administered 2022-10-25 (×2): 1 g via INTRAVENOUS
  Filled 2022-10-25 (×2): qty 50

## 2022-10-25 MED ORDER — PHENOL 1.4 % MT LIQD: 1.0000 | OROMUCOSAL | Status: DC | PRN

## 2022-10-25 MED ORDER — CEFAZOLIN SODIUM-DEXTROSE 2-4 GM/100ML-% IV SOLN
2.0000 g | INTRAVENOUS | Status: AC
  Administered 2022-10-25: 2 g via INTRAVENOUS

## 2022-10-25 MED ORDER — BUPIVACAINE-EPINEPHRINE (PF) 0.25% -1:200000 IJ SOLN
INTRAMUSCULAR | Status: AC
  Filled 2022-10-25: qty 30

## 2022-10-25 MED ORDER — SODIUM CHLORIDE 0.9% FLUSH: 3.0000 mL | INTRAVENOUS | Status: DC | PRN

## 2022-10-25 MED ORDER — LACTATED RINGERS IV SOLN: INTRAVENOUS | Status: DC | PRN

## 2022-10-25 MED ORDER — LABETALOL HCL 5 MG/ML IV SOLN
INTRAVENOUS | Status: AC
  Administered 2022-10-25: 10 mg via INTRAVENOUS
  Filled 2022-10-25: qty 4

## 2022-10-25 MED ORDER — ONDANSETRON HCL 4 MG PO TABS: 4.0000 mg | ORAL_TABLET | Freq: Three times a day (TID) | ORAL | 0 refills | Status: DC | PRN

## 2022-10-25 MED ORDER — TRANEXAMIC ACID-NACL 1000-0.7 MG/100ML-% IV SOLN
INTRAVENOUS | Status: AC
  Filled 2022-10-25: qty 100

## 2022-10-25 MED ORDER — FENTANYL CITRATE (PF) 250 MCG/5ML IJ SOLN
INTRAMUSCULAR | Status: DC | PRN
  Administered 2022-10-25 (×2): 50 ug via INTRAVENOUS
  Administered 2022-10-25: 100 ug via INTRAVENOUS
  Administered 2022-10-25: 50 ug via INTRAVENOUS

## 2022-10-25 MED ORDER — MIDAZOLAM HCL 2 MG/2ML IJ SOLN
INTRAMUSCULAR | Status: AC
  Filled 2022-10-25: qty 2

## 2022-10-25 MED ORDER — BUPIVACAINE-EPINEPHRINE 0.25% -1:200000 IJ SOLN
INTRAMUSCULAR | Status: DC | PRN
  Administered 2022-10-25: 4 mL

## 2022-10-25 MED ORDER — LIDOCAINE 2% (20 MG/ML) 5 ML SYRINGE
INTRAMUSCULAR | Status: AC
  Filled 2022-10-25: qty 10

## 2022-10-25 MED ORDER — PROPOFOL 10 MG/ML IV BOLUS
INTRAVENOUS | Status: AC
  Filled 2022-10-25: qty 20

## 2022-10-25 MED ORDER — PHENYLEPHRINE 80 MCG/ML (10ML) SYRINGE FOR IV PUSH (FOR BLOOD PRESSURE SUPPORT)
PREFILLED_SYRINGE | INTRAVENOUS | Status: AC
  Filled 2022-10-25: qty 40

## 2022-10-25 MED ORDER — OXYCODONE HCL 5 MG PO TABS: 5.0000 mg | ORAL_TABLET | Freq: Once | ORAL | Status: AC | PRN

## 2022-10-25 MED ORDER — POLYETHYLENE GLYCOL 3350 17 G PO PACK: 17.0000 g | PACK | Freq: Every day | ORAL | Status: DC | PRN

## 2022-10-25 MED ORDER — OXYCODONE HCL 5 MG PO TABS: 5.0000 mg | ORAL_TABLET | ORAL | Status: DC | PRN

## 2022-10-25 MED ORDER — ROCURONIUM BROMIDE 10 MG/ML (PF) SYRINGE
PREFILLED_SYRINGE | INTRAVENOUS | Status: DC | PRN
  Administered 2022-10-25: 20 mg via INTRAVENOUS
  Administered 2022-10-25: 80 mg via INTRAVENOUS
  Administered 2022-10-25: 20 mg via INTRAVENOUS
  Administered 2022-10-25: 10 mg via INTRAVENOUS
  Administered 2022-10-25: 20 mg via INTRAVENOUS

## 2022-10-25 MED ORDER — CHLORHEXIDINE GLUCONATE 0.12 % MT SOLN
OROMUCOSAL | Status: AC
  Administered 2022-10-25: 15 mL via OROMUCOSAL
  Filled 2022-10-25: qty 15

## 2022-10-25 MED ORDER — DEXAMETHASONE 4 MG PO TABS
4.0000 mg | ORAL_TABLET | Freq: Four times a day (QID) | ORAL | Status: DC
  Administered 2022-10-25 – 2022-10-26 (×2): 4 mg via ORAL
  Filled 2022-10-25 (×2): qty 1

## 2022-10-25 MED ORDER — FENTANYL CITRATE (PF) 100 MCG/2ML IJ SOLN
INTRAMUSCULAR | Status: AC
  Filled 2022-10-25: qty 2

## 2022-10-25 MED ORDER — DEXAMETHASONE SODIUM PHOSPHATE 10 MG/ML IJ SOLN
INTRAMUSCULAR | Status: DC | PRN
  Administered 2022-10-25: 10 mg via INTRAVENOUS

## 2022-10-25 MED ORDER — LABETALOL HCL 5 MG/ML IV SOLN
INTRAVENOUS | Status: DC | PRN
  Administered 2022-10-25: 10 mg via INTRAVENOUS

## 2022-10-25 MED ORDER — 0.9 % SODIUM CHLORIDE (POUR BTL) OPTIME
TOPICAL | Status: DC | PRN
  Administered 2022-10-25 (×2): 1000 mL

## 2022-10-25 MED ORDER — LABETALOL HCL 5 MG/ML IV SOLN: 10.0000 mg | Freq: Once | INTRAVENOUS | Status: AC

## 2022-10-25 MED ORDER — EPHEDRINE 5 MG/ML INJ
INTRAVENOUS | Status: AC
  Filled 2022-10-25: qty 5

## 2022-10-25 MED ORDER — SODIUM CHLORIDE 0.9 % IV SOLN: 250.0000 mL | INTRAVENOUS | Status: DC

## 2022-10-25 MED ORDER — PROPOFOL 10 MG/ML IV BOLUS
INTRAVENOUS | Status: DC | PRN
  Administered 2022-10-25: 200 mg via INTRAVENOUS

## 2022-10-25 MED ORDER — ACETAMINOPHEN 650 MG RE SUPP: 650.0000 mg | RECTAL | Status: DC | PRN

## 2022-10-25 MED ORDER — OXYCODONE HCL 5 MG/5ML PO SOLN
ORAL | Status: AC
  Filled 2022-10-25: qty 5

## 2022-10-25 MED ORDER — DEXAMETHASONE SODIUM PHOSPHATE 10 MG/ML IJ SOLN
INTRAMUSCULAR | Status: AC
  Filled 2022-10-25: qty 2

## 2022-10-25 MED ORDER — SODIUM CHLORIDE 0.9% FLUSH
3.0000 mL | Freq: Two times a day (BID) | INTRAVENOUS | Status: DC
  Administered 2022-10-26: 3 mL via INTRAVENOUS

## 2022-10-25 SURGICAL SUPPLY — 69 items
AGENT HMST KT MTR STRL THRMB (HEMOSTASIS) ×1
BAG COUNTER SPONGE SURGICOUNT (BAG) ×1 IMPLANT
BAG SPNG CNTER NS LX DISP (BAG) ×2
BLADE CLIPPER SURG (BLADE) IMPLANT
BUR EGG ELITE 4.0 (BURR) IMPLANT
BUR MATCHSTICK NEURO 3.0 LAGG (BURR) IMPLANT
CABLE BIPOLOR RESECTION CORD (MISCELLANEOUS) ×1 IMPLANT
CANISTER SUCT 3000ML PPV (MISCELLANEOUS) ×1 IMPLANT
CLSR STERI-STRIP ANTIMIC 1/2X4 (GAUZE/BANDAGES/DRESSINGS) ×1 IMPLANT
COVER MAYO STAND STRL (DRAPES) ×3 IMPLANT
COVER SURGICAL LIGHT HANDLE (MISCELLANEOUS) ×2 IMPLANT
DEVICE ENDSKLTN IMPL 16X14X7X6 (Cage) IMPLANT
DEVICE ENDSKLTN MED 6 7MM (Orthopedic Implant) IMPLANT
DRAIN CHANNEL 15F RND FF W/TCR (WOUND CARE) IMPLANT
DRAPE C-ARM 42X72 X-RAY (DRAPES) ×1 IMPLANT
DRAPE POUCH INSTRU U-SHP 10X18 (DRAPES) ×1 IMPLANT
DRAPE SURG 17X23 STRL (DRAPES) ×1 IMPLANT
DRAPE U-SHAPE 47X51 STRL (DRAPES) ×1 IMPLANT
DRSG OPSITE POSTOP 3X4 (GAUZE/BANDAGES/DRESSINGS) ×1 IMPLANT
DRSG OPSITE POSTOP 4X6 (GAUZE/BANDAGES/DRESSINGS) IMPLANT
DURAPREP 26ML APPLICATOR (WOUND CARE) ×1 IMPLANT
ELECT COATED BLADE 2.86 ST (ELECTRODE) ×1 IMPLANT
ELECT PENCIL ROCKER SW 15FT (MISCELLANEOUS) ×1 IMPLANT
ELECT REM PT RETURN 9FT ADLT (ELECTROSURGICAL) ×1
ELECTRODE REM PT RTRN 9FT ADLT (ELECTROSURGICAL) ×1 IMPLANT
ENDOSKELETON IMPLANT 16X14X7X6 (Cage) ×1 IMPLANT
ENDOSKELETON MED 6 7MM (Orthopedic Implant) ×1 IMPLANT
GLOVE BIO SURGEON STRL SZ 6.5 (GLOVE) ×1 IMPLANT
GLOVE BIOGEL PI IND STRL 6.5 (GLOVE) ×1 IMPLANT
GLOVE BIOGEL PI IND STRL 8.5 (GLOVE) ×1 IMPLANT
GLOVE SS BIOGEL STRL SZ 8.5 (GLOVE) ×1 IMPLANT
GOWN STRL REUS W/ TWL LRG LVL3 (GOWN DISPOSABLE) ×1 IMPLANT
GOWN STRL REUS W/TWL 2XL LVL3 (GOWN DISPOSABLE) ×1 IMPLANT
GOWN STRL REUS W/TWL LRG LVL3 (GOWN DISPOSABLE) ×1
KIT BASIN OR (CUSTOM PROCEDURE TRAY) ×1 IMPLANT
KIT TURNOVER KIT B (KITS) ×1 IMPLANT
NDL SPNL 18GX3.5 QUINCKE PK (NEEDLE) ×1 IMPLANT
NEEDLE HYPO 22GX1.5 SAFETY (NEEDLE) ×1 IMPLANT
NEEDLE SPNL 18GX3.5 QUINCKE PK (NEEDLE) ×1 IMPLANT
NS IRRIG 1000ML POUR BTL (IV SOLUTION) ×1 IMPLANT
PACK ORTHO CERVICAL (CUSTOM PROCEDURE TRAY) ×1 IMPLANT
PACK UNIVERSAL I (CUSTOM PROCEDURE TRAY) ×1 IMPLANT
PAD ARMBOARD 7.5X6 YLW CONV (MISCELLANEOUS) ×2 IMPLANT
PATTIES SURGICAL .25X.25 (GAUZE/BANDAGES/DRESSINGS) ×1 IMPLANT
PIN DISTRACTION MAXCESS-C 14 (PIN) IMPLANT
PLATE ACP 1.6X36 2LVL (Plate) IMPLANT
POSITIONER HEAD DONUT 9IN (MISCELLANEOUS) ×1 IMPLANT
PUTTY BONE DBX 2.5 MIS (Bone Implant) IMPLANT
RESTRAINT LIMB HOLDER UNIV (RESTRAINTS) ×1 IMPLANT
SCREW ACP 3.5 X 13 S/D VARIA (Screw) ×2 IMPLANT
SCREW ACP 3.5X13 S/D VAR ANGLE (Screw) IMPLANT
SCREW ACP VA SD 3.5X15 (Screw) IMPLANT
SPONGE INTESTINAL PEANUT (DISPOSABLE) ×1 IMPLANT
SPONGE SURGIFOAM ABS GEL 100 (HEMOSTASIS) ×1 IMPLANT
SPONGE T-LAP 4X18 ~~LOC~~+RFID (SPONGE) ×2 IMPLANT
STRIP CLOSURE SKIN 1/2X4 (GAUZE/BANDAGES/DRESSINGS) IMPLANT
SURGIFLO W/THROMBIN 8M KIT (HEMOSTASIS) IMPLANT
SUT BONE WAX W31G (SUTURE) ×1 IMPLANT
SUT MNCRL AB 3-0 PS2 27 (SUTURE) ×1 IMPLANT
SUT SILK 2 0 (SUTURE) ×1
SUT SILK 2-0 18XBRD TIE 12 (SUTURE) IMPLANT
SUT VIC AB 2-0 CT1 18 (SUTURE) ×1 IMPLANT
SYR BULB IRRIG 60ML STRL (SYRINGE) ×1 IMPLANT
SYR CONTROL 10ML LL (SYRINGE) ×1 IMPLANT
TAPE CLOTH 4X10 WHT NS (GAUZE/BANDAGES/DRESSINGS) ×1 IMPLANT
TAPE UMBILICAL 1/8X30 (MISCELLANEOUS) ×1 IMPLANT
TOWEL GREEN STERILE (TOWEL DISPOSABLE) ×1 IMPLANT
TOWEL GREEN STERILE FF (TOWEL DISPOSABLE) ×1 IMPLANT
WATER STERILE IRR 1000ML POUR (IV SOLUTION) ×1 IMPLANT

## 2022-10-25 NOTE — Discharge Instructions (Signed)

## 2022-10-25 NOTE — Brief Op Note (Signed)
10/25/2022  11:27 AM  PATIENT:  [name]  42 y.o. 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

## 2022-10-25 NOTE — Transfer of Care (Signed)
Immediate Anesthesia Transfer of Care Note  Patient: RAFEEF LAU  Procedure(s) Performed: ANTERIOR CERVICAL DECOMPRESSION/DISCECTOMY FUSION 2 LEVELS C4-6  Patient Location: PACU  Anesthesia Type:General  Level of Consciousness: drowsy and patient cooperative  Airway & Oxygen Therapy: Patient Spontanous Breathing  Post-op Assessment: Report given to RN, Post -op Vital signs reviewed and stable, and Patient moving all extremities X 4  Post vital signs: Reviewed and stable  Last Vitals:  Vitals Value Taken Time  BP 178/106 10/25/22 1117  Temp    Pulse 91 10/25/22 1121  Resp 28 10/25/22 1121  SpO2 99 % 10/25/22 1121  Vitals shown include unvalidated device data.  Last Pain:  Vitals:   10/25/22 0635  TempSrc:   PainSc: 4       Patients Stated Pain Goal: 0 (10/25/22 1610)  Complications:  Encounter Notable Events  Notable Event Outcome Phase Comment  Difficult to intubate - expected  Intraprocedure Filed from anesthesia note documentation.   BP discussed with Dr. Maple Hudson. Treating with Labetalol

## 2022-10-25 NOTE — Progress Notes (Signed)
While doing the pre-op assessment, the patient rated her pain level 4/10 and the location of her pain in her left chest under her left breast. Patient denies shortness of breath and states the pain comes and goes. Per the patient she has experienced this pain several times before and the pain is usually relieved with Tylenol. Dr. Maple Hudson made aware and a verbal order was received to obtain an EKG.

## 2022-10-25 NOTE — Anesthesia Preprocedure Evaluation (Signed)
Anesthesia Evaluation  Patient identified by MRN, date of birth, ID band Patient awake    Reviewed: Allergy & Precautions, NPO status , Patient's Chart, lab work & pertinent test results  History of Anesthesia Complications Negative for: history of anesthetic complications  Airway Mallampati: IV  TM Distance: <3 FB Neck ROM: Full    Dental  (+) Missing, Dental Advisory Given,    Pulmonary neg shortness of breath, asthma , neg sleep apnea, neg COPD, neg recent URI, Current Smoker and Patient abstained from smoking.   breath sounds clear to auscultation       Cardiovascular negative cardio ROS  Rhythm:Regular     Neuro/Psych  Headaches PSYCHIATRIC DISORDERS Anxiety        GI/Hepatic Neg liver ROS, PUD,GERD  Medicated and Controlled,,  Endo/Other  negative endocrine ROS    Renal/GU negative Renal ROS     Musculoskeletal  (+) Arthritis ,    Abdominal   Peds  Hematology negative hematology ROS (+) Lab Results      Component                Value               Date                      WBC                      10.1                02/04/2021                HGB                      12.3                02/04/2021                HCT                      36.1                02/04/2021                MCV                      93.5                02/04/2021                PLT                      287                 02/04/2021              Anesthesia Other Findings   Reproductive/Obstetrics Lab Results      Component                Value               Date                      PREGTESTUR               NEGATIVE            10/25/2022  PREGSERUM                NEGATIVE            01/04/2016                HCG                      <5.0                01/29/2021                                        Anesthesia Physical Anesthesia Plan  ASA: 2  Anesthesia Plan: General   Post-op Pain  Management: Ofirmev IV (intra-op)*   Induction: Intravenous  PONV Risk Score and Plan: 2 and Ondansetron and Dexamethasone  Airway Management Planned: Oral ETT and Video Laryngoscope Planned  Additional Equipment: None  Intra-op Plan:   Post-operative Plan: Extubation in OR  Informed Consent: I have reviewed the patients History and Physical, chart, labs and discussed the procedure including the risks, benefits and alternatives for the proposed anesthesia with the patient or authorized representative who has indicated his/her understanding and acceptance.     Dental advisory given  Plan Discussed with: CRNA  Anesthesia Plan Comments:        Anesthesia Quick Evaluation

## 2022-10-25 NOTE — H&P (Signed)
History:  [name] presents today because of ongoing neck and neuropathic right arm pain. Despite appropriate conservative management her quality of life is continued to deteriorate. As result we are electing to move forward with a two-level ACDF C4-6.  Past Medical History:  Diagnosis Date   Anemia    Anxiety    Arthritis    Asthma    Erosive esophagitis    distal esophagus, with low grade bleed 03/2009   GERD (gastroesophageal reflux disease)    Headache(784.0)    History of migraine headaches    IBS (irritable bowel syndrome)    Infection    UTI    Allergies  Allergen Reactions   Ibuprofen Other (See Comments)    Ulcer flare up    Tramadol Hives    No current facility-administered medications on file prior to encounter.   Current Outpatient Medications on File Prior to Encounter  Medication Sig Dispense Refill   acetaminophen-codeine (TYLENOL #3) 300-30 MG tablet Take 1 tablet by mouth every 4 (four) hours as needed for moderate pain.     baclofen (LIORESAL) 10 MG tablet Take 10 mg by mouth 3 (three) times daily.     medroxyPROGESTERone (DEPO-PROVERA) 150 MG/ML injection Inject 150 mg into the muscle every 3 (three) months.     polyethylene glycol (MIRALAX / GLYCOLAX) 17 g packet Take 17 g by mouth daily. (Patient taking differently: Take 17 g by mouth daily as needed for moderate constipation.) 14 each 0   acetaminophen (TYLENOL) 500 MG tablet Take 1,000 mg by mouth every 6 (six) hours as needed for moderate pain.     albuterol (VENTOLIN HFA) 108 (90 Base) MCG/ACT inhaler Inhale 2 puffs into the lungs every 6 (six) hours as needed for wheezing or shortness of breath. 1 each 1   HYDROcodone-acetaminophen (NORCO/VICODIN) 5-325 MG tablet Take 1 tablet by mouth every 6 (six) hours as needed for severe pain. (Patient not taking: Reported on 10/17/2022) 10 tablet 0   lidocaine (XYLOCAINE) 2 % solution Use as directed 15 mLs in the mouth or throat every 3 (three) hours as  needed for mouth pain (Sore throat). (Patient not taking: Reported on 10/17/2022) 300 mL 0   nitroGLYCERIN (NITRODUR - DOSED IN MG/24 HR) 0.2 mg/hr patch APPLY 1/4TH PATCH TO AFFECTED ANKLE, CHANGE DAILY (Patient not taking: Reported on 10/17/2022) 30 patch 1   pantoprazole (PROTONIX) 40 MG tablet Take 1 tablet (40 mg total) by mouth 2 (two) times daily. Take for 14 days. (Patient not taking: Reported on 10/17/2022) 28 tablet 0   [DISCONTINUED] omeprazole (PRILOSEC) 20 MG capsule Take 1 capsule (20 mg total) by mouth daily. (Patient not taking: Reported on 12/23/2018) 30 capsule 0    Physical Exam: Vitals:   10/25/22 0625  BP: (!) 135/91  Pulse: 64  Resp: 18  Temp: 98.3 F (36.8 C)  SpO2: 100%   Body mass index is 36.47 kg/m. Clinical exam: [name] is a pleasant individual, who appears younger than their stated age.  She is alert and orientated 3.  No shortness of breath, chest pain.  Abdomen is soft and non-tender, negative loss of bowel and bladder control, no rebound tenderness.  Negative: skin lesions abrasions contusions  Peripheral pulses: 2+ peripheral pulses bilaterally. LE compartments are: Soft and nontender.  Gait pattern: normal  Assistive devices: none  Neuro: Left upper extremity: 5/5 motor strength throughout. Right upper extremity: 5/5 grip and tricep strength. 3/5 deltoid, bicep, and wrist extensor strength. positive numbness and  dysesthesias in the right C5 and 6 dermatomes. Positive Spurling sign with reproduction of right radicular arm pain. Negative Hoffman test, negative Babinski test, negative Lhermitte sign. Decreased sensation to light touch in the right C5 and C6 dermatome.  Musculoskeletal: Mild neck discomfort with palpation and range of motion. No shoulder, elbow, wrist pain with isolated joint range of motion  Imaging: Loss of normal cervical lordosis (a slight kyphotic alignment) mid cervical spine. Degenerative disc changes C5-6 and to a lesser  degree C6-7. Positive facet arthrosis is noted.  Cervical MRI: completed on 09/02/2022. No cord signal changes. Severe left neuroforaminal narrowing at C6-7 with mild central stenosis. Severe broad-based right disc protrusion at C5-6 and C4-5 causing marked compression of the exiting right C5 and C6 nerve roots. There is severe right neuroforaminal narrowing at C3-4 affecting the exiting C4 nerve root.   Image: DG Lumbar Spine Complete  Result Date: 10/14/2022 CLINICAL DATA:  Back pain. EXAM: LUMBAR SPINE - COMPLETE 4+ VIEW COMPARISON:  CT abdomen pelvis dated January 29, 2021. FINDINGS: Five lumbar type vertebral bodies. No acute fracture or subluxation. Vertebral body heights are preserved. Alignment is normal. Similar moderate L3-L4 and mild L4-L5 disc height loss. Sacroiliac joints are unremarkable. IMPRESSION: 1.  No acute osseous abnormality. 2. Similar moderate L3-L4 and mild L4-L5 degenerative disc disease. Electronically Signed   By: Obie Dredge M.D.   On: 10/14/2022 18:37    A/P: Summary: [name] is a very pleasant 42 year old woman who 2 months ago woke up with a kink in her neck with moderate neck pain and dysesthesias into the right upper extremity. Over the course of the next 2 months the neck pain completely resolved but her neuropathic arm pain progressed. She now has weakness in the C5 and C6 dermatome. While the MRI does show foraminal stenosis at C3-4 this would correlate to C4 pain. The patient denies any significant numbness or dysesthesias or pain in the trapezius or scapular region. Although there is disease at C3-4 clinically it is not manifesting itself. Patient also has significant left C6-7 foraminal stenosis but denies any symptoms in the left upper extremity. Based on the patient's clinical exam and imaging studies I believe the C5 and C6 nerve compression on the right side due to the disc osteophyte complex at C4-5 and C5-6 is the primary area of pathology.  I have gone  over the pathology with the patient in great detail and she is expressed an understanding. Treatment options include injection therapy, physical therapy, or surgical intervention. I did express to her that I am concerned that she has been having progressive neurological deficits and she has had weakness now for greater than 6 weeks. Given the neurological deficits and neuropathic pain I think it is reasonable to move forward with surgery. I do not think injection therapy or physical therapy is a reasonable chance of success. This would only delay intervention and increase the potential for permanent neurological deficits. I have gone over the surgical procedure in great detail which would be a two-level ACDF C4-6. Given the kyphotic alignment of the spine and the degenerative facet arthrosis I do not think total disc arthroplasty is her best option.  Risks and benefits of surgery were discussed with the patient. These include: Infection, bleeding, death, stroke, paralysis, ongoing or worse pain, need for additional surgery, nonunion, leak of spinal fluid, adjacent segment degeneration requiring additional fusion surgery. Pseudoarthrosis (nonunion)requiring supplemental posterior fixation. Throat pain, swallowing difficulties, hoarseness or change in voice.

## 2022-10-25 NOTE — Anesthesia Procedure Notes (Signed)
Procedure Name: Intubation Date/Time: 10/25/2022 8:00 AM  Performed by: Alease Medina, CRNAPre-anesthesia Checklist: Patient identified, Emergency Drugs available, Suction available and Patient being monitored Patient Re-evaluated:Patient Re-evaluated prior to induction Oxygen Delivery Method: Circle system utilized Preoxygenation: Pre-oxygenation with 100% oxygen Induction Type: IV induction Ventilation: Mask ventilation without difficulty Laryngoscope Size: Glidescope and 3 Grade View: Grade I Tube type: Oral Tube size: 7.0 mm Number of attempts: 1 Airway Equipment and Method: Stylet and Video-laryngoscopy Placement Confirmation: ETT inserted through vocal cords under direct vision, positive ETCO2 and breath sounds checked- equal and bilateral Secured at: 21 cm Tube secured with: Tape Dental Injury: Teeth and Oropharynx as per pre-operative assessment  Difficulty Due To: Difficulty was anticipated and Difficult Airway- due to limited oral opening

## 2022-10-25 NOTE — Op Note (Addendum)
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  NuVasive 36 mm ACP plate  Graft: DBX mix   BRIEF HISTORY: VERMELL MADRID is a 42 y.o. female who presented to my office with significant neck and radicular right arm pain.  Imaging confirmed a disc herniation at C4-5 and C5-6 posterior lateral to the right causing root compression.  Attempts at conservative management have failed to alleviate her symptoms and so we elected to proceed with surgery.  All appropriate risks, benefits, and alternatives were discussed with the patient and consent was obtained.  PROCEDURE DETAILS: Patient was brought into the operating room and was properly positioned on the operating room table.  After induction with general anesthesia the patient was endotracheally intubated.  A timeout was taken to confirm all important data: including patient, procedure, and the level. Teds, SCD's were applied.   The anterior cervical spine was prepped and draped in a standard fashion.  Using fluoroscopy I marked out my incision site over the midportion of the C5 vertebral body.  I infiltrated this with quarter percent Marcaine with epinephrine.  A transverse incision was made and sharp dissection was carried out down to and through the platysma.  I then identified the sternocleidomastoid and began dissecting along the medial border performing a standard Smith-Robinson approach to the cervical spine.  I identified the omohyoid and released it from the sling but did not feel as though I needed to resect it.  Hand-held retractor was  placed to retract the trach and esophagus to the right and I used Kitner dissectors to dissect through the remainder of the prevertebral fascia to expose the anterior longitudinal ligament.  A needle was placed into the C4-5 disc space and an x-ray was taken confirming I was at the appropriate level.  This was marked using a Bovie and then I mobilized the longus coli muscle using bipolar cautery from the superior aspect of C4 to the inferior aspect of C6.  Once this was done bilaterally I then placed my Caspar retractor blades underneath the longus coli muscle deflated the endotracheal cuff and expanded the retractor to the appropriate width.  An annulotomy was performed at the C5-6 disc space and I remove the bulk of the disc material with pituitary rongeurs.  Using curettes I continued to resect the disc until I could visualize the posterior annulus.  Using a fine nerve hook I created a plane underneath the annulus and resected this along with the posterior osteophyte with a 1 mm Kerrison rongeur.  I then gently began dissecting through the posterior longitudinal ligament until I could create a plane between the thecal sac and the PLL.  I then resected the PLL from the midline starting to the left.  I then gently began dissecting under the PLL going towards the right creating a plane between the disc herniation and the thecal sac.  I gently mobilized the disc fragment and then resected it with a 1 mm Kerrison rongeur.  Once this was done the thecal sac was able to expand into the space that had been created by removal of the  bone spur/disc fragment.  I then undercut the uncovertebral joint to further decompress the nerve root.  I then rasped the endplates and used my trial to determine the best size implant.  The 7 mm medium lordotic implant provided the best overall fit.  The implant was then packed with bone graft and I irrigated out the disc space and removed any loose fragments of bone.  The implant was then  gently inserted into the disc space and I confirmed satisfactory positioning with fluoroscopy.  I reposition the retractor to expose the C4-5 disc space and using the exact same technique I performed a discectomy at at this level.  I again made sure to dissect the disc fragment out and resect the PLL and undercut the uncovertebral joints to ensure adequate compression.  Once the discectomy was complete I rasped the endplates and used my trialing devices.  I elected to use the 7 mm medium lordotic implant.  The disc space was irrigated and the implant was packed with the graft and malleted into position.  With both grafts properly seated I then contoured a 36 mm anterior cervical plate and affixed it to the bodies of C4 and C6 with 15 mm locking screws.  I then secured into the body of C5 with 13 mm locking screws.  All 6 screws had excellent purchase.  The locking device on the plate was then engaged per manufacture standards.  I removed all of the remaining retractors and irrigated the wound copiously normal saline.  I again checked to ensure the esophagus was not entrapped beneath the plate.  I then returned the trach and esophagus to midline.  Platysma was then closed with interrupted 2-0 Vicryl suture, and the skin with 3-0 Monocryl.  Steri-Strips and dry dressings were applied and the patient was ultimately extubated transfer the PACU without incident.  The end of the case all needle sponge counts were correct.  Venita Lick, MD 10/25/2022 11:18 AM

## 2022-10-26 DIAGNOSIS — M50121 Cervical disc disorder at C4-C5 level with radiculopathy: Secondary | ICD-10-CM | POA: Diagnosis not present

## 2022-10-26 NOTE — Evaluation (Signed)
Occupational Therapy Evaluation Patient Details Name: [name] MRN: [id number] DOB: May 04, 1981 Today's Date: 10/26/2022   History of Present Illness 42 yo F s/p ACDF.  PMH includes: IBS, Anxiety, Arthritis   Clinical Impression   Patient admitted for the procedure above.  PTA she lives at home with her children and spouse.  She will also have a lot of support from other family members.  Patient did quite well, not needing any assist for ADL completion or in room mobility.  No further OT needs in the acute setting, all education reviewed and documented in education section.  Recommend follow up as prescribed by MD.        Recommendations for follow up therapy are one component of a multi-disciplinary discharge planning process, led by the attending physician.  Recommendations may be updated based on patient status, additional functional criteria and insurance authorization.   Assistance Recommended at Discharge PRN  Patient can return home with the following Assist for transportation;Assistance with cooking/housework    Functional Status Assessment  Patient has not had a recent decline in their functional status  Equipment Recommendations  None recommended by OT    Recommendations for Other Services       Precautions / Restrictions Precautions Precautions: Cervical Precaution Booklet Issued: Yes (comment) Restrictions Weight Bearing Restrictions: No      Mobility Bed Mobility Overal bed mobility: Modified Independent                  Transfers Overall transfer level: Independent                        Balance Overall balance assessment: No apparent balance deficits (not formally assessed)                                         ADL either performed or assessed with clinical judgement   ADL Overall ADL's : At baseline                                             Vision Patient Visual Report: No change  from baseline       Perception     Praxis      Pertinent Vitals/Pain Pain Assessment Pain Assessment: Faces Faces Pain Scale: Hurts a little bit Pain Location: cervicial Pain Descriptors / Indicators: Tender Pain Intervention(s): Monitored during session     Hand Dominance Right   Extremity/Trunk Assessment Upper Extremity Assessment Upper Extremity Assessment: Overall WFL for tasks assessed   Lower Extremity Assessment Lower Extremity Assessment: Overall WFL for tasks assessed   Cervical / Trunk Assessment Cervical / Trunk Assessment: Neck Surgery   Communication Communication Communication: No difficulties   Cognition Arousal/Alertness: Awake/alert Behavior During Therapy: WFL for tasks assessed/performed Overall Cognitive Status: Within Functional Limits for tasks assessed                                       General Comments   VSS on RA    Exercises     Shoulder Instructions      Home Living Family/patient expects to be discharged to:: Private residence Living Arrangements: Spouse/significant other;Children Available Help at  Discharge: Family;Available 24 hours/day Type of Home: House Home Access: Stairs to enter     Home Layout: Two level;1/2 bath on main level;Bed/bath upstairs   Alternate Level Stairs-Rails: Right Bathroom Shower/Tub: Tub/shower unit;Walk-in shower   Bathroom Toilet: Standard Bathroom Accessibility: Yes How Accessible: Accessible via walker Home Equipment: None          Prior Functioning/Environment Prior Level of Function : Independent/Modified Independent;Working/employed;Driving                        OT Problem List: Pain      OT Treatment/Interventions:      OT Goals(Current goals can be found in the care plan section) Acute Rehab OT Goals Patient Stated Goal: Return home OT Goal Formulation: With patient Time For Goal Achievement: 10/30/22 Potential to Achieve Goals: Good  OT  Frequency:      Co-evaluation              AM-PAC OT "6 Clicks" Daily Activity     Outcome Measure Help from another person eating meals?: None Help from another person taking care of personal grooming?: None Help from another person toileting, which includes using toliet, bedpan, or urinal?: None Help from another person bathing (including washing, rinsing, drying)?: None Help from another person to put on and taking off regular upper body clothing?: None Help from another person to put on and taking off regular lower body clothing?: None 6 Click Score: 24   End of Session Equipment Utilized During Treatment: Cervical collar Nurse Communication: Mobility status  Activity Tolerance: Patient tolerated treatment well Patient left: in bed;with call bell/phone within reach  OT Visit Diagnosis: Muscle weakness (generalized) (M62.81)                Time: 1610-9604 OT Time Calculation (min): 15 min Charges:  OT General Charges $OT Visit: 1 Visit OT Evaluation $OT Eval Moderate Complexity: 1 Mod  10/26/2022  RP, OTR/L  Acute Rehabilitation Services  Office:  [phone]   Suzanna Obey 10/26/2022, 8:56 AM

## 2022-10-26 NOTE — Progress Notes (Signed)
Pt. discharged home accompanied by husband. Prescriptions and discharge instructions given with verbalization of understanding. Incision site on neck with no s/s of infection - no swelling, redness, bleeding, and/or drainage noted. Pt. transported out of this unit in wheelchair by the staff of the unit

## 2022-10-26 NOTE — Anesthesia Postprocedure Evaluation (Signed)
Anesthesia Post Note  Patient: [name]  Procedure(s) Performed: ANTERIOR CERVICAL DECOMPRESSION/DISCECTOMY FUSION 2 LEVELS C4-6     Patient location during evaluation: PACU Anesthesia Type: General Level of consciousness: awake and alert Pain management: pain level controlled Vital Signs Assessment: post-procedure vital signs reviewed and stable Respiratory status: spontaneous breathing, nonlabored ventilation and respiratory function stable Cardiovascular status: blood pressure returned to baseline and stable Postop Assessment: no apparent nausea or vomiting Anesthetic complications: yes   Encounter Notable Events  Notable Event Outcome Phase Comment  Difficult to intubate - expected  Intraprocedure Filed from anesthesia note documentation.    Last Vitals:  Vitals:   10/26/22 0411 10/26/22 0715  BP: (!) 151/89 (!) 148/93  Pulse: 73 74  Resp: 18 20  Temp: 37.1 C 36.9 C  SpO2: 99% 100%    Last Pain:  Vitals:   10/26/22 0715  TempSrc: Oral  PainSc:                  Gillie Fleites

## 2022-10-26 NOTE — Discharge Summary (Signed)
Patient ID: [name] MRN: [id number] DOB/AGE: 1980/10/25 42 y.o.  Admit date: 10/25/2022 Discharge date: 10/26/2022  Admission Diagnoses:  Principal Problem:   Cervical disc herniation   Discharge Diagnoses:  Principal Problem:   Cervical disc herniation  status post Procedure(s): ANTERIOR CERVICAL DECOMPRESSION/DISCECTOMY FUSION 2 LEVELS C4-6  Past Medical History:  Diagnosis Date   Anemia    Anxiety    Arthritis    Asthma    Erosive esophagitis    distal esophagus, with low grade bleed 03/2009   GERD (gastroesophageal reflux disease)    Headache(784.0)    History of migraine headaches    IBS (irritable bowel syndrome)    Infection    UTI    Surgeries: Procedure(s): ANTERIOR CERVICAL DECOMPRESSION/DISCECTOMY FUSION 2 LEVELS C4-6 on 10/25/2022   Consultants:   Discharged Condition: Improved  Hospital Course: [name] is an 42 y.o. female who was admitted 10/25/2022 for operative treatment of Cervical disc herniation. Patient failed conservative treatments (please see the history and physical for the specifics) and had severe unremitting pain that affects sleep, daily activities and work/hobbies. After pre-op clearance, the patient was taken to the operating room on 10/25/2022 and underwent  Procedure(s): ANTERIOR CERVICAL DECOMPRESSION/DISCECTOMY FUSION 2 LEVELS C4-6.    Patient was given perioperative antibiotics:  Anti-infectives (From admission, onward)    Start     Dose/Rate Route Frequency Ordered Stop   10/25/22 1330  ceFAZolin (ANCEF) IVPB 1 g/50 mL premix        1 g 100 mL/hr over 30 Minutes Intravenous Every 8 hours 10/25/22 1238 10/25/22 2151   10/25/22 0626  ceFAZolin (ANCEF) 2-4 GM/100ML-% IVPB       Note to Pharmacy: Patsi Sears E: cabinet override      10/25/22 0626 10/25/22 0745   10/25/22 0618  ceFAZolin (ANCEF) IVPB 2g/100 mL premix        2 g 200 mL/hr over 30 Minutes Intravenous 30 min pre-op 10/25/22 0618 10/25/22 0745         Patient was given sequential compression devices and early ambulation to prevent DVT.   Patient benefited maximally from hospital stay and there were no complications. At the time of discharge, the patient was urinating/moving their bowels without difficulty, tolerating a regular diet, pain is controlled with oral pain medications and they have been cleared by PT/OT.   Recent vital signs: Patient Vitals for the past 24 hrs:  BP Temp Temp src Pulse Resp SpO2  10/26/22 0715 (!) 148/93 98.4 F (36.9 C) Oral 74 20 100 %  10/26/22 0411 (!) 151/89 98.8 F (37.1 C) Oral 73 18 99 %  10/25/22 2308 (!) 157/90 98.7 F (37.1 C) Oral 77 18 100 %  10/25/22 1930 (!) 152/90 98.6 F (37 C) Oral 73 18 100 %  10/25/22 1613 (!) 169/93 98 F (36.7 C) Oral 69 16 100 %  10/25/22 1236 (!) 160/98 98.6 F (37 C) Oral 75 18 100 %  10/25/22 1215 (!) 159/97 98.6 F (37 C) -- 74 19 100 %  10/25/22 1200 (!) 157/95 -- -- 78 19 100 %  10/25/22 1145 (!) 152/99 -- -- 82 (!) 27 98 %  10/25/22 1130 (!) 171/106 -- -- 88 19 100 %  10/25/22 1117 (!) 178/106 98.9 F (37.2 C) -- (!) 105 (!) 29 100 %     Recent laboratory studies: No results for input(s): "WBC", "HGB", "HCT", "PLT", "NA", "K", "CL", "CO2", "BUN", "CREATININE", "GLUCOSE", "INR", "CALCIUM" in the last  72 hours.  Invalid input(s): "PT", "2"   Discharge Medications:   Allergies as of 10/26/2022       Reactions   Ibuprofen Other (See Comments)   Ulcer flare up   Tramadol Hives        Medication List     STOP taking these medications    acetaminophen 500 MG tablet Commonly known as: TYLENOL   acetaminophen-codeine 300-30 MG tablet Commonly known as: TYLENOL #3   baclofen 10 MG tablet Commonly known as: LIORESAL   HYDROcodone-acetaminophen 5-325 MG tablet Commonly known as: NORCO/VICODIN   lidocaine 2 % solution Commonly known as: XYLOCAINE   nitroGLYCERIN 0.2 mg/hr patch Commonly known as: NITRODUR - Dosed in mg/24 hr        TAKE these medications    albuterol 108 (90 Base) MCG/ACT inhaler Commonly known as: VENTOLIN HFA Inhale 2 puffs into the lungs every 6 (six) hours as needed for wheezing or shortness of breath.   medroxyPROGESTERone 150 MG/ML injection Commonly known as: DEPO-PROVERA Inject 150 mg into the muscle every 3 (three) months.   methocarbamol 500 MG tablet Commonly known as: ROBAXIN Take 1 tablet (500 mg total) by mouth every 8 (eight) hours as needed for up to 5 days for muscle spasms.   ondansetron 4 MG tablet Commonly known as: Zofran Take 1 tablet (4 mg total) by mouth every 8 (eight) hours as needed for nausea or vomiting.   oxyCODONE-acetaminophen 10-325 MG tablet Commonly known as: Percocet Take 1 tablet by mouth every 6 (six) hours as needed for up to 5 days for pain.   pantoprazole 40 MG tablet Commonly known as: PROTONIX Take 1 tablet (40 mg total) by mouth 2 (two) times daily. Take for 14 days.   polyethylene glycol 17 g packet Commonly known as: MIRALAX / GLYCOLAX Take 17 g by mouth daily. What changed:  when to take this reasons to take this        Diagnostic Studies: DG Cervical Spine 2 or 3 views  Result Date: 10/25/2022 CLINICAL DATA:  ACDF of C4-C6 EXAM: CERVICAL SPINE - 2 VIEW COMPARISON:  Radiograph of the cervical spine dated January 30, 2023 FINDINGS: Fluoroscopic images were obtained intraoperatively and submitted for post operative interpretation. ACDF of C4-C6 with hardware in expected position, 3 images were obtained with 37 seconds of fluoroscopy time and 3.05 mGy. Please see the performing provider's procedural report for further detail. IMPRESSION: Intraoperative fluoroscopic guidance for ACDF of C4-C6. Electronically Signed   By: Allegra Lai M.D.   On: 10/25/2022 13:21   DG C-Arm 1-60 Min-No Report  Result Date: 10/25/2022 Fluoroscopy was utilized by the requesting physician.  No radiographic interpretation.   DG C-Arm 1-60 Min-No  Report  Result Date: 10/25/2022 Fluoroscopy was utilized by the requesting physician.  No radiographic interpretation.   DG C-Arm 1-60 Min-No Report  Result Date: 10/25/2022 Fluoroscopy was utilized by the requesting physician.  No radiographic interpretation.   DG Lumbar Spine Complete  Result Date: 10/14/2022 CLINICAL DATA:  Back pain. EXAM: LUMBAR SPINE - COMPLETE 4+ VIEW COMPARISON:  CT abdomen pelvis dated January 29, 2021. FINDINGS: Five lumbar type vertebral bodies. No acute fracture or subluxation. Vertebral body heights are preserved. Alignment is normal. Similar moderate L3-L4 and mild L4-L5 disc height loss. Sacroiliac joints are unremarkable. IMPRESSION: 1.  No acute osseous abnormality. 2. Similar moderate L3-L4 and mild L4-L5 degenerative disc disease. Electronically Signed   By: Obie Dredge M.D.   On: 10/14/2022 18:37  Discharge Instructions     Incentive spirometry RT   Complete by: As directed         Follow-up Information     Venita Lick, MD. Schedule an appointment as soon as possible for a visit in 2 week(s).   Specialty: Orthopedic Surgery Why: If symptoms worsen, For suture removal, For wound re-check Contact information: [address] [place] [address] [phone]                 Discharge Plan:  discharge to home  Disposition: [name] is a very pleasant 42 year old woman who is now status post a two-level ACDF.  She complains of some mild incisional pain but she has no shortness of breath or difficulty tolerating oral diet.  Her pain is well-controlled, and she has been ambulating and voiding spontaneously.  Will plan on discharge to home with appropriate instructions and medications.    Signed: Alvy Beal for Dr. Venita Lick [facility] [phone] 10/26/2022, 7:46 AM

## 2022-10-29 ENCOUNTER — Encounter (HOSPITAL_COMMUNITY): Payer: Self-pay | Admitting: Orthopedic Surgery

## 2023-03-20 ENCOUNTER — Other Ambulatory Visit: Payer: Self-pay

## 2023-03-20 ENCOUNTER — Emergency Department (HOSPITAL_COMMUNITY): Payer: 59

## 2023-03-20 ENCOUNTER — Encounter (HOSPITAL_COMMUNITY): Payer: Self-pay | Admitting: Emergency Medicine

## 2023-03-20 ENCOUNTER — Emergency Department (HOSPITAL_COMMUNITY)
Admission: EM | Admit: 2023-03-20 | Discharge: 2023-03-20 | Disposition: A | Discharge status: Home / Self Care | Payer: 59 | Attending: Emergency Medicine | Admitting: Emergency Medicine

## 2023-03-20 DIAGNOSIS — Y92410 Unspecified street and highway as the place of occurrence of the external cause: Secondary | ICD-10-CM | POA: Insufficient documentation

## 2023-03-20 DIAGNOSIS — R030 Elevated blood-pressure reading, without diagnosis of hypertension: Secondary | ICD-10-CM | POA: Diagnosis not present

## 2023-03-20 DIAGNOSIS — S63502A Unspecified sprain of left wrist, initial encounter: Secondary | ICD-10-CM

## 2023-03-20 DIAGNOSIS — S161XXA Strain of muscle, fascia and tendon at neck level, initial encounter: Secondary | ICD-10-CM | POA: Diagnosis not present

## 2023-03-20 DIAGNOSIS — S6392XA Sprain of unspecified part of left wrist and hand, initial encounter: Secondary | ICD-10-CM | POA: Diagnosis not present

## 2023-03-20 DIAGNOSIS — S199XXA Unspecified injury of neck, initial encounter: Secondary | ICD-10-CM | POA: Diagnosis present

## 2023-03-20 MED ORDER — OXYCODONE-ACETAMINOPHEN 5-325 MG PO TABS
1.0000 | ORAL_TABLET | Freq: Once | ORAL | Status: DC
  Filled 2023-03-20: qty 1

## 2023-03-20 MED ORDER — ONDANSETRON HCL 4 MG/2ML IJ SOLN
4.0000 mg | Freq: Once | INTRAMUSCULAR | Status: AC
  Administered 2023-03-20: 4 mg via INTRAVENOUS
  Filled 2023-03-20: qty 2

## 2023-03-20 MED ORDER — METHOCARBAMOL 750 MG PO TABS: 750.0000 mg | ORAL_TABLET | Freq: Three times a day (TID) | ORAL | 0 refills | Status: DC | PRN

## 2023-03-20 MED ORDER — ACETAMINOPHEN 500 MG PO TABS: 1000.0000 mg | ORAL_TABLET | Freq: Once | ORAL | Status: DC

## 2023-03-20 MED ORDER — HYDROMORPHONE HCL 1 MG/ML IJ SOLN
1.0000 mg | Freq: Once | INTRAMUSCULAR | Status: AC
  Administered 2023-03-20: 1 mg via INTRAVENOUS
  Filled 2023-03-20: qty 1
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
## 2023-03-20 NOTE — Discharge Instructions (Addendum)
It was our pleasure to provide your ER care today - we hope that you feel better.  Take acetaminophen as need for pain. You may also take robaxin as need for muscle pain/spasm - no driving for the next 6 hours or when taking robaxin. For wrist pain, wear splint as need for comfort/support for the next 3-4 days - follow up with primary care doctor in 1-2 weeks if wrist pain fails to improve/resolve.    Follow up with primary care doctor in the next 1-2 weeks - also have your blood pressure rechecked then as it is high today.  Return to ER If worse, new symptoms, new/severe pain, fevers, severe headache, chest pain, trouble breathing, or other concern.

## 2023-03-20 NOTE — ED Notes (Signed)
Pt transported to CT 4 by [employer]
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
## 2023-10-07 ENCOUNTER — Emergency Department (HOSPITAL_COMMUNITY)

## 2023-10-07 ENCOUNTER — Encounter (HOSPITAL_COMMUNITY): Payer: Self-pay

## 2023-10-07 ENCOUNTER — Emergency Department (HOSPITAL_COMMUNITY)
Admission: EM | Admit: 2023-10-07 | Discharge: 2023-10-07 | Disposition: A | Discharge status: Home / Self Care | Attending: Emergency Medicine | Admitting: Emergency Medicine

## 2023-10-07 DIAGNOSIS — N644 Mastodynia: Secondary | ICD-10-CM | POA: Insufficient documentation

## 2023-10-07 DIAGNOSIS — N6320 Unspecified lump in the left breast, unspecified quadrant: Secondary | ICD-10-CM | POA: Diagnosis not present

## 2023-10-07 DIAGNOSIS — R079 Chest pain, unspecified: Secondary | ICD-10-CM

## 2023-10-07 DIAGNOSIS — R072 Precordial pain: Secondary | ICD-10-CM | POA: Diagnosis not present

## 2023-10-07 LAB — I-STAT CHEM 8, ED
BUN: 9 mg/dL (ref 6–20)
Calcium, Ion: 1.08 mmol/L — ABNORMAL LOW (ref 1.15–1.40)
Chloride: 111 mmol/L (ref 98–111)
Creatinine, Ser: 0.9 mg/dL (ref 0.44–1.00)
Glucose, Bld: 85 mg/dL (ref 70–99)
HCT: 38 % (ref 36.0–46.0)
Hemoglobin: 12.9 g/dL (ref 12.0–15.0)
Potassium: 4.3 mmol/L (ref 3.5–5.1)
Sodium: 140 mmol/L (ref 135–145)
TCO2: 22 mmol/L (ref 22–32)

## 2023-10-07 LAB — COMPREHENSIVE METABOLIC PANEL WITH GFR
ALT: 14 U/L (ref 0–44)
AST: 14 U/L — ABNORMAL LOW (ref 15–41)
Albumin: 3.7 g/dL (ref 3.5–5.0)
Alkaline Phosphatase: 51 U/L (ref 38–126)
Anion gap: 7 (ref 5–15)
BUN: 9 mg/dL (ref 6–20)
CO2: 20 mmol/L — ABNORMAL LOW (ref 22–32)
Calcium: 8.9 mg/dL (ref 8.9–10.3)
Chloride: 111 mmol/L (ref 98–111)
Creatinine, Ser: 0.87 mg/dL (ref 0.44–1.00)
GFR, Estimated: >60 mL/min (ref >60)
Glucose, Bld: 94 mg/dL (ref 70–99)
Potassium: 3.6 mmol/L (ref 3.5–5.1)
Sodium: 138 mmol/L (ref 135–145)
Total Bilirubin: 0.7 mg/dL (ref 0.0–1.2)
Total Protein: 6.5 g/dL (ref 6.5–8.1)

## 2023-10-07 LAB — TROPONIN I (HIGH SENSITIVITY)
Troponin I (High Sensitivity): <2 ng/L (ref <18)
Troponin I (High Sensitivity): 3 ng/L (ref <18)

## 2023-10-07 LAB — CBC WITH DIFFERENTIAL/PLATELET
Abs Immature Granulocytes: 0.02 10*3/uL (ref 0.00–0.07)
Basophils Absolute: 0 10*3/uL (ref 0.0–0.1)
Basophils Relative: 1 %
Eosinophils Absolute: 0.1 10*3/uL (ref 0.0–0.5)
Eosinophils Relative: 1 %
HCT: 35.8 % — ABNORMAL LOW (ref 36.0–46.0)
Hemoglobin: 12.3 g/dL (ref 12.0–15.0)
Immature Granulocytes: 0 %
Lymphocytes Relative: 41 %
Lymphs Abs: 3.6 10*3/uL (ref 0.7–4.0)
MCH: 32.7 pg (ref 26.0–34.0)
MCHC: 34.4 g/dL (ref 30.0–36.0)
MCV: 95.2 fL (ref 80.0–100.0)
Monocytes Absolute: 0.6 10*3/uL (ref 0.1–1.0)
Monocytes Relative: 7 %
Neutro Abs: 4.3 10*3/uL (ref 1.7–7.7)
Neutrophils Relative %: 50 %
Platelets: 309 10*3/uL (ref 150–400)
RBC: 3.76 MIL/uL — ABNORMAL LOW (ref 3.87–5.11)
RDW: 14.6 % (ref 11.5–15.5)
WBC: 8.7 10*3/uL (ref 4.0–10.5)
nRBC: 0 % (ref 0.0–0.2)

## 2023-10-07 LAB — HCG, SERUM, QUALITATIVE: Preg, Serum: NEGATIVE

## 2023-10-07 LAB — I-STAT CG4 LACTIC ACID, ED
Lactic Acid, Venous: 0.4 mmol/L — ABNORMAL LOW (ref 0.5–1.9)
Lactic Acid, Venous: 0.5 mmol/L (ref 0.5–1.9)

## 2023-10-07 MED ORDER — AMOXICILLIN-POT CLAVULANATE 875-125 MG PO TABS
1.0000 | ORAL_TABLET | Freq: Once | ORAL | Status: AC
  Administered 2023-10-07: 1 via ORAL
  Filled 2023-10-07: qty 1

## 2023-10-07 MED ORDER — HYDROMORPHONE HCL 1 MG/ML IJ SOLN
0.5000 mg | Freq: Once | INTRAMUSCULAR | Status: AC
  Administered 2023-10-07: 0.5 mg via INTRAVENOUS
  Filled 2023-10-07: qty 1

## 2023-10-07 MED ORDER — OXYCODONE HCL 5 MG PO TABS
5.0000 mg | ORAL_TABLET | Freq: Four times a day (QID) | ORAL | 0 refills | Status: AC | PRN
Start: 2023-10-07 — End: 2023-10-10

## 2023-10-07 MED ORDER — AMOXICILLIN-POT CLAVULANATE 875-125 MG PO TABS: 1.0000 | ORAL_TABLET | Freq: Two times a day (BID) | ORAL | 0 refills | Status: DC

## 2023-10-07 MED ORDER — FENTANYL CITRATE PF 50 MCG/ML IJ SOSY
50.0000 ug | PREFILLED_SYRINGE | Freq: Once | INTRAMUSCULAR | Status: AC
  Administered 2023-10-07: 50 ug via INTRAVENOUS
  Filled 2023-10-07: qty 1

## 2023-10-07 MED ORDER — IOHEXOL 350 MG/ML SOLN
75.0000 mL | Freq: Once | INTRAVENOUS | Status: AC | PRN
  Administered 2023-10-07: 75 mL via INTRAVENOUS

## 2023-10-07 NOTE — ED Provider Notes (Signed)
 Patient given in sign out by Reita Chard.  Please review their note for patient HPI, physical exam, workup.  At this time the plan is to get delta trop and follow up on CTA but ultimately plan on breast center follow up for suspected breast abscess.  Labs and imaging ultimately reassuring.  Patient has remained stable during her 7 hours in the ED.  I spoke to the patient about the results and that she will need to follow-up with the breast clinic and although patient does not have a white count and does not have known abscess on the CTA previous provider noted erythema in the area along with tenderness and was suspicious of abscess so we will give antibiotics.  First dose was given here.  Patient requested pain meds as she cannot take ibuprofen along with tramadol and so we will give a few days worth of oxycodone.  I just spoke with the patient about the risk and benefit of this medication do not operate machinery or drive and patient verbalized understanding acceptance of this.  Patient also requested a referral to cardiology as she states she has had this chest pain for quite some time and wants to be evaluated for it by specialist and so this was placed.  Patient given return precautions.  Patient stable for discharge.  Patient verbalized understanding and acceptance of this plan.   Remi Deter 10/07/23 1849    Gloris Manchester, MD 10/07/23 [phone]

## 2023-10-07 NOTE — ED Notes (Signed)
 Patient transported to CT

## 2023-10-07 NOTE — ED Provider Notes (Cosign Needed Addendum)
 [place] EMERGENCY DEPARTMENT AT [facility] Provider Note   CSN: [id number] Arrival date & time: 10/07/23  1152     History Chief Complaint  Patient presents with   Chest Pain    [name] is a 43 y.o. female with history of left breast abscess/cellulitis, GERD presents the emergency room today for evaluation of chest pain and left breast pain.  Patient reports that she did feels a mild left breast tenderness when putting her bra on this morning however having the bra and felt better.  Did not appreciate any swelling at this time.  She reports that around 0800 today she started to have some slightly to the left sternal chest pain that she felt radiate into the breast.  Did not radiate anywhere else.  She describes this as stabbing.  Is worsening whenever she takes a deep breath.  Does have some tenderness upon palpation to the area as well.  She denies any shortness of breath.  Denies any palpitations.  She is on Depo.  She reports that this feeling is similar to the one she had previously whenever she had a breast abscess/cellulitis.  Denies any trauma to the area.  Denies any fevers or chills or recent illnesses.  She reports that she has had some chest pain off and on for "a while now" usually has it once a year.  She denies any nipple discharge.   Chest Pain Associated symptoms: no abdominal pain, no cough, no fever, no nausea, no palpitations, no shortness of breath and no vomiting        Home Medications Prior to Admission medications   Medication Sig Start Date End Date Taking? Authorizing Provider  albuterol (VENTOLIN HFA) 108 (90 Base) MCG/ACT inhaler Inhale 2 puffs into the lungs every 6 (six) hours as needed for wheezing or shortness of breath. 02/05/21   Dorcas Carrow, MD  medroxyPROGESTERone (DEPO-PROVERA) 150 MG/ML injection Inject 150 mg into the muscle every 3 (three) months. 12/06/20   [provider]  methocarbamol (ROBAXIN) 750 MG tablet Take 1  tablet (750 mg total) by mouth 3 (three) times daily as needed (muscle spasm/pain). 03/20/23   Cathren Laine, MD  ondansetron (ZOFRAN) 4 MG tablet Take 1 tablet (4 mg total) by mouth every 8 (eight) hours as needed for nausea or vomiting. 10/25/22   Venita Lick, MD  pantoprazole (PROTONIX) 40 MG tablet Take 1 tablet (40 mg total) by mouth 2 (two) times daily. Take for 14 days. Patient not taking: Reported on 10/17/2022 04/21/21   Beverley Fiedler, MD  polyethylene glycol (MIRALAX / GLYCOLAX) 17 g packet Take 17 g by mouth daily. Patient taking differently: Take 17 g by mouth daily as needed for moderate constipation. 02/06/21   Dorcas Carrow, MD  omeprazole (PRILOSEC) 20 MG capsule Take 1 capsule (20 mg total) by mouth daily. Patient not taking: Reported on 12/23/2018 07/18/18 03/16/19  Rodriguez-Southworth, Nettie Elm, PA-C      Allergies    Ibuprofen, Aspirin, and Tramadol    Review of Systems   Review of Systems  Constitutional:  Negative for chills and fever.  Respiratory:  Negative for cough and shortness of breath.   Cardiovascular:  Positive for chest pain. Negative for palpitations and leg swelling.  Gastrointestinal:  Negative for abdominal pain, constipation, diarrhea, nausea and vomiting.    Physical Exam Updated Vital Signs BP 133/81   Pulse 72   Temp 98.2 F (36.8 C) (Oral)   Resp (!) 22   SpO2  100%  Physical Exam Vitals and nursing note reviewed. Exam conducted with a chaperone present Therapist, nutritional, tech and Musician, Charity fundraiser).  Constitutional:      Appearance: She is not toxic-appearing.     Comments: Uncomfortable appearing  Cardiovascular:     Rate and Rhythm: Normal rate.     Pulses:          Radial pulses are 2+ on the right side and 2+ on the left side.       Dorsalis pedis pulses are 2+ on the right side and 2+ on the left side.       Posterior tibial pulses are 2+ on the right side and 2+ on the left side.  Pulmonary:     Effort: Pulmonary effort is normal. No tachypnea or  respiratory distress.     Breath sounds: No decreased breath sounds.  Chest:     Comments: Patient does have some slight left breast swelling.  No nipple discharge.  To the lower half of the breast there is the palpable area of induration, approximately the size of a large Kiwi.  Minimal overlying warmth.  No induration appreciated.  It is mobile.  I do not appreciate any masses into the axilla. No skin dimpling. Musculoskeletal:     Right lower leg: No tenderness. No edema.     Left lower leg: No tenderness. No edema.  Skin:    General: Skin is warm.  Neurological:     Mental Status: She is alert.     Comments: Sensation reportedly intact and symmetric in upper extremities.      ED Results / Procedures / Treatments   Labs (all labs ordered are listed, but only abnormal results are displayed) Labs Reviewed  CBC WITH DIFFERENTIAL/PLATELET - Abnormal; Notable for the following components:      Result Value   RBC 3.76 (*)    HCT 35.8 (*)    All other components within normal limits  COMPREHENSIVE METABOLIC PANEL WITH GFR - Abnormal; Notable for the following components:   CO2 20 (*)    AST 14 (*)    All other components within normal limits  I-STAT CG4 LACTIC ACID, ED - Abnormal; Notable for the following components:   Lactic Acid, Venous 0.4 (*)    All other components within normal limits  I-STAT CHEM 8, ED - Abnormal; Notable for the following components:   Calcium, Ion 1.08 (*)    All other components within normal limits  HCG, SERUM, QUALITATIVE  URINALYSIS, ROUTINE W REFLEX MICROSCOPIC  I-STAT CG4 LACTIC ACID, ED  TROPONIN I (HIGH SENSITIVITY)  TROPONIN I (HIGH SENSITIVITY)    EKG EKG Interpretation Date/Time:  Monday October 07 2023 12:13:37 EDT Ventricular Rate:  67 PR Interval:  160 QRS Duration:  84 QT Interval:  382 QTC Calculation: 403 R Axis:   90  Text Interpretation: Normal sinus rhythm Rightward axis Borderline ECG When compared with ECG of 25-Oct-2022  07:01, PREVIOUS ECG IS PRESENT since 10/25/22, no changes seen Confirmed by Eber Hong ([id number]) on 10/07/2023 12:18:16 PM  Radiology DG Chest Portable 1 View Result Date: 10/07/2023 CLINICAL DATA:  Chest pain EXAM: PORTABLE CHEST 1 VIEW COMPARISON:  02/02/2021 FINDINGS: The heart size and mediastinal contours are within normal limits. Both lungs are clear. The visualized skeletal structures are unremarkable. IMPRESSION: No active disease. Electronically Signed   By: Charlett Nose M.D.   On: 10/07/2023 13:58    Procedures Procedures   Medications Ordered in ED Medications  fentaNYL (SUBLIMAZE)  injection 50 mcg (50 mcg Intravenous Given 10/07/23 1340)    ED Course/ Medical Decision Making/ A&P                               Medical Decision Making Amount and/or Complexity of Data Reviewed Labs: ordered. Radiology: ordered.  Risk Prescription drug management.   43 y.o. female presents to the ER for evaluation of chest and left breast pain. Differential diagnosis includes but is not limited to ACS, pericarditis, myocarditis, aortic dissection, PE, pneumothorax, esophageal rupture, pneumonia, reflux/PUD, biliary disease, pancreatitis, costochondritis, anxiety, breast abscess, cellulitis, malignancy. Vital signs mildly elevated blood pressure at 143/99 otherwise unremarkable. Physical exam as noted above.   On previous chart evaluation, patient was admitted in 2022 for breast colitis/mastitis.  She was given information for [place] [facility] however was lost to follow-up.  Given the degree of how uncomfortable the patient looks as well as chest pain and breast tenderness/mass palpated, will order CT angio to rule out any PE, cellulitis/abscess.  I independently reviewed and interpreted the patient's labs.  CBC shows mild decrease hematocrit.  Normal hemoglobin.  No [place] ptosis.  hCG negative.  Troponin less than 2.  CMP shows mildly decreased bicarb with a normal anion gap.  AST mildly decreased  at 14.  No other electrolyte or LFT abnormality.  Lactic acid decreased at 0.4.  Urinalysis only to be collected  Chest x-ray shows no active disease. Per radiologist's interpretation.    On reevaluation, patient reports that she was having good pain control with the phenol however she does feel that the pain is returning.  Dilaudid ordered.  CT angio pending.  Disposition to be determined with CT angio results.  4:34 PM Care of [name]  transferred to PA Wynonia Hazard at the end of my shift as the patient will require reassessment once labs/imaging have resulted. Patient presentation, ED course, and plan of care discussed with review of all pertinent labs and imaging. Please see his/her note for further details regarding further ED course and disposition. Plan at time of handoff is . This may be altered or completely changed at the discretion of the oncoming team pending results of further workup.  Final Clinical Impression(s) / ED Diagnoses Final diagnoses:  None    Rx / DC Orders ED Discharge Orders     None         Achille Rich, PA-C 10/07/23 1638    Achille Rich, PA-C 10/07/23 1640    Eber Hong, MD 10/09/23 1550

## 2023-10-07 NOTE — ED Triage Notes (Signed)
 Pt presents to ED via EMS from home with chest pain since 0800, described a stabbing pain going to left breast. Worse with deep breath and with touch. NO SOB. Lightheadedness especially when walking. 8/10. Also reports that she feels like her left breast is swelling. .4 nitroglycerin with no change. No aspirin due to allergy.

## 2023-10-07 NOTE — Discharge Instructions (Signed)
 Please follow-up with the breast clinic and cardiologist have attached your for you today in regards to recent symptoms and ER visit.  Today your labs and imaging are reassuring however you will need to follow-up with the breast clinic to get ultrasound done to further evaluate your breast concerns.  I have given you antibiotics along with pain meds.  Please take Tylenol every 6 hours as needed for pain however pain not controlled with a may use the Oxy IR as prescribed.  Please do not operate machinery or drive after this as it will make you drowsy.  I have also tested cardiologist for you to follow-up with for your chest pain.  If symptoms change or worsen please return to the ER.

## 2023-10-09 ENCOUNTER — Other Ambulatory Visit: Payer: Self-pay | Admitting: Family

## 2023-10-09 DIAGNOSIS — N611 Abscess of the breast and nipple: Secondary | ICD-10-CM

## 2023-10-09 DIAGNOSIS — N644 Mastodynia: Secondary | ICD-10-CM

## 2023-10-17 ENCOUNTER — Ambulatory Visit: Admitting: Cardiology

## 2023-10-22 ENCOUNTER — Encounter

## 2023-10-22 ENCOUNTER — Other Ambulatory Visit

## 2023-12-04 ENCOUNTER — Ambulatory Visit
Admission: EM | Admit: 2023-12-04 | Discharge: 2023-12-04 | Disposition: A | Discharge status: Home / Self Care | Attending: Family Medicine | Admitting: Family Medicine

## 2023-12-04 DIAGNOSIS — J069 Acute upper respiratory infection, unspecified: Secondary | ICD-10-CM | POA: Diagnosis not present

## 2023-12-04 DIAGNOSIS — J4521 Mild intermittent asthma with (acute) exacerbation: Secondary | ICD-10-CM

## 2023-12-04 DIAGNOSIS — H1032 Unspecified acute conjunctivitis, left eye: Secondary | ICD-10-CM | POA: Diagnosis not present

## 2023-12-04 MED ORDER — ALBUTEROL SULFATE HFA 108 (90 BASE) MCG/ACT IN AERS: 1.0000 | INHALATION_SPRAY | Freq: Four times a day (QID) | RESPIRATORY_TRACT | 0 refills | Status: AC | PRN

## 2023-12-04 MED ORDER — PROMETHAZINE-DM 6.25-15 MG/5ML PO SYRP: 5.0000 mL | ORAL_SOLUTION | Freq: Four times a day (QID) | ORAL | 0 refills | Status: DC | PRN

## 2023-12-04 MED ORDER — PREDNISONE 20 MG PO TABS: 40.0000 mg | ORAL_TABLET | Freq: Every day | ORAL | 0 refills | Status: AC

## 2023-12-04 MED ORDER — POLYMYXIN B-TRIMETHOPRIM 10000-0.1 UNIT/ML-% OP SOLN: 1.0000 [drp] | Freq: Four times a day (QID) | OPHTHALMIC | 0 refills | Status: AC

## 2023-12-04 NOTE — ED Triage Notes (Signed)
 Pt states cough and congestion for the past 2 days.  Also states she is having white drainage from her left eye.  States she has been taking Dayquil and Nyquil at home.

## 2023-12-04 NOTE — Discharge Instructions (Signed)
 I refilled your butyryl inhaler as needed for wheezing or shortness of breath.  Take prednisone  daily for 5 days.  You may use Promethazine  DM as needed for your cough.  Please note this medication will make you drowsy.  Do not drink alcohol or drive on this medication.  Start Polytrim antibiotic eyedrops to the left eye as prescribed.  Warm compresses as needed.  Lots of rest and fluids.  Please follow-up with your PCP if your symptoms do not improve.  Please go to the ER for any worsening symptoms.  Hope you feel better soon!
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
## 2024-01-14 ENCOUNTER — Encounter: Payer: Self-pay | Admitting: *Deleted

## 2024-01-16 ENCOUNTER — Ambulatory Visit: Attending: Cardiology | Admitting: Cardiology

## 2024-01-20 ENCOUNTER — Ambulatory Visit: Admitting: Cardiology

## 2024-06-18 ENCOUNTER — Telehealth: Payer: Self-pay

## 2024-06-18 NOTE — Telephone Encounter (Signed)
 Good afternoon!  I apologize as I'm not sure how this got back to us  from our phone team, but was not sure who to send it to on your side. Thanks in advance!

## 2024-06-18 NOTE — Telephone Encounter (Signed)
 Copied from CRM #[id number]. Topic: Clinical - Medical Advice >> Jun 17, 2024 12:51 PM Deleta S wrote: Reason for CRM: [employer] patient employer is calling if the patient should continue to work from home. Date 12/9 until 90 days. Please contact [phone]

## 2024-07-09 ENCOUNTER — Ambulatory Visit
Admission: EM | Admit: 2024-07-09 | Discharge: 2024-07-09 | Disposition: A | Discharge status: Home / Self Care | Attending: Family Medicine | Admitting: Family Medicine

## 2024-07-09 ENCOUNTER — Other Ambulatory Visit: Payer: Self-pay

## 2024-07-09 DIAGNOSIS — J309 Allergic rhinitis, unspecified: Secondary | ICD-10-CM | POA: Diagnosis not present

## 2024-07-09 DIAGNOSIS — J019 Acute sinusitis, unspecified: Secondary | ICD-10-CM | POA: Diagnosis not present

## 2024-07-09 DIAGNOSIS — R030 Elevated blood-pressure reading, without diagnosis of hypertension: Secondary | ICD-10-CM | POA: Diagnosis not present

## 2024-07-09 MED ORDER — PROMETHAZINE-DM 6.25-15 MG/5ML PO SYRP: 5.0000 mL | ORAL_SOLUTION | Freq: Three times a day (TID) | ORAL | 0 refills | Status: DC | PRN

## 2024-07-09 MED ORDER — AMOXICILLIN-POT CLAVULANATE 400-57 MG/5ML PO SUSR: 800.0000 mg | Freq: Two times a day (BID) | ORAL | 0 refills | Status: AC

## 2024-07-09 NOTE — ED Provider Notes (Signed)
 " [facility], Television/film/video - [facility]  Note:  This document was prepared using Conservation officer, historic buildings and may include unintentional dictation errors.  MRN: [id number] DOB: June 27, 1981  Subjective:   [name] is a 44 y.o. female presenting for 8-day history of persistent sinus congestion, sinus drainage, facial pressure, throat pain, left-sided sinus headache, malaise and fatigue.  Has used multiple over-the-counter measures with minimal relief.  Denies history of hypertension.  Has had regular checks with her PCP.  Has a history of asthma but has not needed her inhaler. No smoking of any kind including cigarettes, cigars, vaping, marijuana use.    Current Outpatient Medications  Medication Instructions   albuterol  (VENTOLIN  HFA) 108 (90 Base) MCG/ACT inhaler 1-2 puffs, Inhalation, Every 6 hours PRN   amoxicillin -clavulanate (AUGMENTIN ) 875-125 MG tablet 1 tablet, Oral, Every 12 hours   medroxyPROGESTERone  (DEPO-PROVERA ) 150 mg, Every 3 months   methocarbamol  (ROBAXIN ) 750 mg, Oral, 3 times daily PRN   ondansetron  (ZOFRAN ) 4 mg, Oral, Every 8 hours PRN   pantoprazole  (PROTONIX ) 40 mg, Oral, 2 times daily, Take for 14 days.   polyethylene glycol (MIRALAX  / GLYCOLAX ) 17 g, Oral, Daily   promethazine -dextromethorphan (PROMETHAZINE -DM) 6.25-15 MG/5ML syrup 5 mLs, Oral, 4 times daily PRN    Allergies[1]  Past Medical History:  Diagnosis Date   Anemia    Anxiety    Arthritis    Asthma    Erosive esophagitis    distal esophagus, with low grade bleed 03/2009   GERD (gastroesophageal reflux disease)    Headache(784.0)    History of migraine headaches    IBS (irritable bowel syndrome)    Infection    UTI     Past Surgical History:  Procedure Laterality Date   ANTERIOR CERVICAL DECOMP/DISCECTOMY FUSION N/A 10/25/2022   Procedure: ANTERIOR CERVICAL DECOMPRESSION/DISCECTOMY FUSION 2 LEVELS C4-6;  Surgeon: Burnetta Aures, MD;  Location: [facility] OR;  Service: Orthopedics;   Laterality: N/A;  3.5 hrs 3 C-Bed   BIOPSY  01/30/2021   Procedure: BIOPSY;  Surgeon: Albertus Gordy HERO, MD;  Location: [facility] ENDOSCOPY;  Service: Gastroenterology;;   ESOPHAGOGASTRODUODENOSCOPY (EGD) WITH PROPOFOL  N/A 01/30/2021   Procedure: ESOPHAGOGASTRODUODENOSCOPY (EGD) WITH PROPOFOL ;  Surgeon: Albertus Gordy HERO, MD;  Location: [facility] ENDOSCOPY;  Service: Gastroenterology;  Laterality: N/A;   LUMBAR LAMINECTOMY/DECOMPRESSION MICRODISCECTOMY Right 01/05/2016   Procedure: Right Lumbar three-four Microdiskectomy;  Surgeon: Rockey Peru, MD;  Location: [facility] NEURO ORS;  Service: Neurosurgery;  Laterality: Right;  right   WRIST SURGERY Right     Family History  Problem Relation Age of Onset   Migraines Mother    Asthma Mother    Migraines Father    Heart disease Father    Cancer Maternal Grandmother    Asthma Daughter     Social History   Occupational History   Not on file  Tobacco Use   Smoking status: Former    Types: Cigars   Smokeless tobacco: Never  Vaping Use   Vaping status: Never Used  Substance and Sexual Activity   Alcohol use: No    Comment: occasional on holidays   Drug use: No   Sexual activity: Yes    Partners: Male    Birth control/protection: Injection, Condom     ROS   Objective:   Vitals: BP (!) 153/96 (BP Location: Left Arm)   Temp 98.3 F (36.8 C) (Oral)   Resp 18   SpO2 100%   BP Readings from Last 3 Encounters:  07/09/24 (!) 153/96  12/04/23 (!) 144/91  10/07/23 (!) 159/87   Physical Exam Constitutional:      General: She is not in acute distress.    Appearance: Normal appearance. She is well-developed and normal weight. She is not ill-appearing, toxic-appearing or diaphoretic.  HENT:     Head: Normocephalic and atraumatic.     Right Ear: Tympanic membrane, ear canal and external ear normal. No drainage or tenderness. No middle ear effusion. There is no impacted cerumen. Tympanic membrane is not erythematous or bulging.     Left Ear: Tympanic membrane,  ear canal and external ear normal. No drainage or tenderness.  No middle ear effusion. There is no impacted cerumen. Tympanic membrane is not erythematous or bulging.     Nose: Congestion present. No rhinorrhea.     Mouth/Throat:     Mouth: Mucous membranes are moist. No oral lesions.     Pharynx: Posterior oropharyngeal erythema (with associated postnasal drainage overlying pharynx) present. No pharyngeal swelling, oropharyngeal exudate or uvula swelling.     Tonsils: No tonsillar exudate or tonsillar abscesses.  Eyes:     General: No scleral icterus.       Right eye: No discharge.        Left eye: No discharge.     Extraocular Movements: Extraocular movements intact.     Right eye: Normal extraocular motion.     Left eye: Normal extraocular motion.     Conjunctiva/sclera: Conjunctivae normal.  Cardiovascular:     Rate and Rhythm: Normal rate and regular rhythm.     Heart sounds: Normal heart sounds. No murmur heard.    No friction rub. No gallop.  Pulmonary:     Effort: Pulmonary effort is normal. No respiratory distress.     Breath sounds: No stridor. No wheezing, rhonchi or rales.  Chest:     Chest wall: No tenderness.  Musculoskeletal:     Cervical back: Normal range of motion and neck supple.  Lymphadenopathy:     Cervical: No cervical adenopathy.  Skin:    General: Skin is warm and dry.  Neurological:     General: No focal deficit present.     Mental Status: She is alert and oriented to person, place, and time.  Psychiatric:        Mood and Affect: Mood normal.        Behavior: Behavior normal.     Assessment and Plan :   PDMP not reviewed this encounter.  1. Acute non-recurrent sinusitis, unspecified location   2. Allergic rhinitis, unspecified seasonality, unspecified trigger   3. Elevated blood pressure reading      Start Augmentin , patient requested the liquid formulation.  Use supportive care.  Had a discussion with patient about diagnostic criteria for  central hypertension but patient does not want to address this at this visit.  Plans on following up with her PCP. Deferred imaging given clear pulmonary exam.  Counseled patient on potential for adverse effects with medications prescribed/recommended today, ER and return-to-clinic precautions discussed, patient verbalized understanding.      [1]  Allergies Allergen Reactions   Ibuprofen Other (See Comments)    Ulcer flare up    Aspirin    Tramadol  Hives     Christopher Savannah, PA-C 07/09/24 1838  "

## 2024-07-09 NOTE — Discharge Instructions (Addendum)
 We will manage this as a sinus infection with amoxicillin -clavulanate. For sore throat or cough try using a honey-based tea. Use 3 teaspoons of honey with juice squeezed from half lemon. Place shaved pieces of ginger into 1/2-1 cup of water and warm over stove top. Then mix the ingredients and repeat every 4 hours as needed. Please take Tylenol  500mg -650mg  every 6 hours for throat pain, fevers, aches and pains. Hydrate very well with at least 2 liters of water. Eat light meals such as soups (chicken and noodles, vegetable, chicken and wild rice).  Do not eat foods that you are allergic to.  Taking an antihistamine like Zyrtec  can help against postnasal drainage, sinus congestion which can cause sinus pain, sinus headaches, throat pain, painful swallowing, coughing.  You can take this together with cough medication as needed.

## 2024-07-09 NOTE — ED Triage Notes (Signed)
 Pt presents for evaluation of post nasal drainage, facial pressure, cough x 8 days. Reports green phlegm. (L) side headache last night.   Pt reports that she has been using NyQuil, DayQuil, severe Tylenol  chest & congestion, and Mucinex. Last dose of Tylenol  chest and congestion medicine was this morning.

## 2024-07-17 ENCOUNTER — Ambulatory Visit
Admission: EM | Admit: 2024-07-17 | Discharge: 2024-07-17 | Disposition: A | Discharge status: Home / Self Care | Attending: Family Medicine | Admitting: Family Medicine

## 2024-07-17 DIAGNOSIS — J019 Acute sinusitis, unspecified: Secondary | ICD-10-CM

## 2024-07-17 DIAGNOSIS — I10 Essential (primary) hypertension: Secondary | ICD-10-CM | POA: Diagnosis not present

## 2024-07-17 MED ORDER — AMLODIPINE BESYLATE 5 MG PO TABS: 5.0000 mg | ORAL_TABLET | Freq: Every day | ORAL | 0 refills | Status: DC

## 2024-07-17 MED ORDER — CEFDINIR 300 MG PO CAPS: 300.0000 mg | ORAL_CAPSULE | Freq: Two times a day (BID) | ORAL | 0 refills | Status: DC

## 2024-07-17 NOTE — ED Provider Notes (Signed)
 " Producer, Television/film/video - [facility]  Note:  This document was prepared using Conservation officer, historic buildings and may include unintentional dictation errors.  MRN: [id number] DOB: 12-Sep-1980  Subjective:   [name] is a 44 y.o. female presenting for recheck on her sinus infection and blood pressure.    Was seen 07/09/2024 and started on Augmentin  for acute sinusitis.  Patient reports she took the medication for 1 to 2 days.  Feels that she broke out into hives on her ears only and then stopped the medication.  Per chart review, she has been prescribed this medication multiple times she reports that she never took it.  Chart review also shows that she has had multiple cephalosporin injections over the years.  Patient cannot recall this, she is not a reliable historian.  However at her request, I added amoxicillin /clavulanate to her allergy list. At her last visit, patient refused starting blood pressure medication.  Reported that she will follow-up with her own PCP but will not be seen for more than a month.  She does report ongoing headaches, sinus pain.  No smoking of any kind including cigarettes, cigars, vaping, marijuana use.  No alcohol use.  No drug use.  Current Outpatient Medications  Medication Instructions   albuterol  (VENTOLIN  HFA) 108 (90 Base) MCG/ACT inhaler 1-2 puffs, Inhalation, Every 6 hours PRN   medroxyPROGESTERone  (DEPO-PROVERA ) 150 mg, Every 3 months   methocarbamol  (ROBAXIN ) 750 mg, Oral, 3 times daily PRN   ondansetron  (ZOFRAN ) 4 mg, Oral, Every 8 hours PRN   pantoprazole  (PROTONIX ) 40 mg, Oral, 2 times daily, Take for 14 days.   polyethylene glycol (MIRALAX  / GLYCOLAX ) 17 g, Oral, Daily   promethazine -dextromethorphan (PROMETHAZINE -DM) 6.25-15 MG/5ML syrup 5 mLs, Oral, 3 times daily PRN    Allergies[1]  Past Medical History:  Diagnosis Date   Anemia    Anxiety    Arthritis    Asthma    Erosive esophagitis    distal esophagus, with low grade bleed  03/2009   GERD (gastroesophageal reflux disease)    Headache(784.0)    History of migraine headaches    IBS (irritable bowel syndrome)    Infection    UTI     Past Surgical History:  Procedure Laterality Date   ANTERIOR CERVICAL DECOMP/DISCECTOMY FUSION N/A 10/25/2022   Procedure: ANTERIOR CERVICAL DECOMPRESSION/DISCECTOMY FUSION 2 LEVELS C4-6;  Surgeon: Burnetta Aures, MD;  Location: [facility] OR;  Service: Orthopedics;  Laterality: N/A;  3.5 hrs 3 C-Bed   BIOPSY  01/30/2021   Procedure: BIOPSY;  Surgeon: Albertus Gordy HERO, MD;  Location: [facility] ENDOSCOPY;  Service: Gastroenterology;;   ESOPHAGOGASTRODUODENOSCOPY (EGD) WITH PROPOFOL  N/A 01/30/2021   Procedure: ESOPHAGOGASTRODUODENOSCOPY (EGD) WITH PROPOFOL ;  Surgeon: Albertus Gordy HERO, MD;  Location: [facility] ENDOSCOPY;  Service: Gastroenterology;  Laterality: N/A;   LUMBAR LAMINECTOMY/DECOMPRESSION MICRODISCECTOMY Right 01/05/2016   Procedure: Right Lumbar three-four Microdiskectomy;  Surgeon: Rockey Peru, MD;  Location: [facility] NEURO ORS;  Service: Neurosurgery;  Laterality: Right;  right   WRIST SURGERY Right     Family History  Problem Relation Age of Onset   Migraines Mother    Asthma Mother    Migraines Father    Heart disease Father    Cancer Maternal Grandmother    Asthma Daughter     Social History   Occupational History   Not on file  Tobacco Use   Smoking status: Former    Types: Cigars   Smokeless tobacco: Never  Vaping Use   Vaping status: Never  Used  Substance and Sexual Activity   Alcohol use: No    Comment: occasional on holidays   Drug use: No   Sexual activity: Yes    Partners: Male    Birth control/protection: Injection, Condom     ROS   Objective:   Vitals: BP (!) 166/103 (BP Location: Left Arm)   Pulse 74   Temp 98 F (36.7 C) (Oral)   Resp 16   SpO2 97%   Physical Exam Constitutional:      General: She is not in acute distress.    Appearance: Normal appearance. She is well-developed and normal weight. She is  not ill-appearing, toxic-appearing or diaphoretic.  HENT:     Head: Normocephalic and atraumatic.     Right Ear: Tympanic membrane, ear canal and external ear normal. No drainage or tenderness. No middle ear effusion. There is no impacted cerumen. Tympanic membrane is not erythematous or bulging.     Left Ear: Tympanic membrane, ear canal and external ear normal. No drainage or tenderness.  No middle ear effusion. There is no impacted cerumen. Tympanic membrane is not erythematous or bulging.     Nose: Nose normal. No congestion or rhinorrhea.     Mouth/Throat:     Mouth: Mucous membranes are moist. No oral lesions.     Pharynx: No pharyngeal swelling, oropharyngeal exudate, posterior oropharyngeal erythema or uvula swelling.     Tonsils: No tonsillar exudate or tonsillar abscesses. 0 on the right. 0 on the left.  Eyes:     General: No scleral icterus.       Right eye: No discharge.        Left eye: No discharge.     Extraocular Movements: Extraocular movements intact.     Right eye: Normal extraocular motion.     Left eye: Normal extraocular motion.     Conjunctiva/sclera: Conjunctivae normal.  Cardiovascular:     Rate and Rhythm: Normal rate.  Pulmonary:     Effort: Pulmonary effort is normal.  Musculoskeletal:     Cervical back: Normal range of motion and neck supple.  Lymphadenopathy:     Cervical: No cervical adenopathy.  Skin:    General: Skin is warm and dry.  Neurological:     General: No focal deficit present.     Mental Status: She is alert and oriented to person, place, and time.     Cranial Nerves: No cranial nerve deficit.     Motor: No weakness.     Coordination: Coordination normal.     Gait: Gait normal.  Psychiatric:        Mood and Affect: Mood normal.        Behavior: Behavior normal.    Assessment and Plan :   PDMP not reviewed this encounter.  1. Acute sinusitis, recurrence not specified, unspecified location   2. Essential hypertension       Offered cefdinir  to address her ongoing sinusitis.  Recommend supportive care.  Patient reported severe ear pain but there is no obvious otitis media.  She also reported headaches and requested pain medication.  Had an extensive discussion with her about appropriate medications to use given her high blood pressure, medication allergies, history of GI bleeding, gastric ulcer, erosive esophagitis.  Patient reports that she has taken prednisone  without any issues.  States that she has some at home.  I suggested that she use this if she can except the risk on her blood pressure and recurrent gastric ulcer, GI bleeding.  Otherwise  follow-up with her PCP closely.  I informed her that it is not standard medical practice to prescribe narcotic pain medication for her symptoms.  Start amlodipine  for blood pressure.  Counseled patient on potential for adverse effects with medications prescribed/recommended today, ER and return-to-clinic precautions discussed, patient verbalized understanding.     [1]  Allergies Allergen Reactions   Ibuprofen Other (See Comments)    Ulcer flare up    Aspirin    Tramadol  Hives     Christopher Savannah, [place] 07/17/24 [id number]  "

## 2024-07-17 NOTE — ED Triage Notes (Signed)
 Pt reports she was prescribed amoxicillin  last week and stopped as she think she is allergic as she started having a break out in her ears. States she has ear pain, sinus congestion, phlegm as she stopped the meds.

## 2024-07-17 NOTE — Discharge Instructions (Addendum)
 Start cefdinir  for an ongoing sinus infection, upper respiratory infection.   Start amlodipine  for your high blood pressure. Follow up with your PCP for further management.

## 2024-07-19 ENCOUNTER — Other Ambulatory Visit: Payer: Self-pay

## 2024-07-19 ENCOUNTER — Emergency Department (HOSPITAL_COMMUNITY)

## 2024-07-19 ENCOUNTER — Emergency Department (HOSPITAL_COMMUNITY)
Admission: EM | Admit: 2024-07-19 | Discharge: 2024-07-19 | Disposition: A | Discharge status: Home / Self Care | Attending: Emergency Medicine | Admitting: Emergency Medicine

## 2024-07-19 ENCOUNTER — Encounter (HOSPITAL_COMMUNITY): Payer: Self-pay

## 2024-07-19 DIAGNOSIS — R059 Cough, unspecified: Secondary | ICD-10-CM | POA: Diagnosis not present

## 2024-07-19 DIAGNOSIS — J329 Chronic sinusitis, unspecified: Secondary | ICD-10-CM | POA: Diagnosis not present

## 2024-07-19 DIAGNOSIS — J029 Acute pharyngitis, unspecified: Secondary | ICD-10-CM | POA: Diagnosis present

## 2024-07-19 LAB — COMPREHENSIVE METABOLIC PANEL WITH GFR
ALT: 15 U/L (ref 0–44)
AST: 16 U/L (ref 15–41)
Albumin: 4.2 g/dL (ref 3.5–5.0)
Alkaline Phosphatase: 65 U/L (ref 38–126)
Anion gap: 10 (ref 5–15)
BUN: 9 mg/dL (ref 6–20)
CO2: 24 mmol/L (ref 22–32)
Calcium: 9.3 mg/dL (ref 8.9–10.3)
Chloride: 108 mmol/L (ref 98–111)
Creatinine, Ser: 0.8 mg/dL (ref 0.44–1.00)
GFR, Estimated: >60 mL/min
Glucose, Bld: 95 mg/dL (ref 70–99)
Potassium: 4.2 mmol/L (ref 3.5–5.1)
Sodium: 142 mmol/L (ref 135–145)
Total Bilirubin: 0.3 mg/dL (ref 0.0–1.2)
Total Protein: 7.1 g/dL (ref 6.5–8.1)

## 2024-07-19 LAB — CBC WITH DIFFERENTIAL/PLATELET
Abs Immature Granulocytes: 0.02 K/uL (ref 0.00–0.07)
Basophils Absolute: 0.1 K/uL (ref 0.0–0.1)
Basophils Relative: 1 %
Eosinophils Absolute: 0.2 K/uL (ref 0.0–0.5)
Eosinophils Relative: 3 %
HCT: 38.3 % (ref 36.0–46.0)
Hemoglobin: 12.8 g/dL (ref 12.0–15.0)
Immature Granulocytes: 0 %
Lymphocytes Relative: 34 %
Lymphs Abs: 2.6 K/uL (ref 0.7–4.0)
MCH: 31.1 pg (ref 26.0–34.0)
MCHC: 33.4 g/dL (ref 30.0–36.0)
MCV: 93 fL (ref 80.0–100.0)
Monocytes Absolute: 0.6 K/uL (ref 0.1–1.0)
Monocytes Relative: 8 %
Neutro Abs: 4.1 K/uL (ref 1.7–7.7)
Neutrophils Relative %: 54 %
Platelets: 344 K/uL (ref 150–400)
RBC: 4.12 MIL/uL (ref 3.87–5.11)
RDW: 14.4 % (ref 11.5–15.5)
WBC: 7.6 K/uL (ref 4.0–10.5)
nRBC: 0 % (ref 0.0–0.2)

## 2024-07-19 LAB — RESP PANEL BY RT-PCR (RSV, FLU A&B, COVID)  RVPGX2
Influenza A by PCR: NEGATIVE
Influenza B by PCR: NEGATIVE
Resp Syncytial Virus by PCR: NEGATIVE
SARS Coronavirus 2 by RT PCR: NEGATIVE

## 2024-07-19 LAB — GROUP A STREP BY PCR: Group A Strep by PCR: NOT DETECTED

## 2024-07-19 MED ORDER — PROMETHAZINE-DM 6.25-15 MG/5ML PO SYRP: 2.5000 mL | ORAL_SOLUTION | Freq: Four times a day (QID) | ORAL | 0 refills | Status: DC | PRN

## 2024-07-19 MED ORDER — ACETAMINOPHEN 500 MG PO TABS
1000.0000 mg | ORAL_TABLET | Freq: Once | ORAL | Status: AC
  Administered 2024-07-19: 1000 mg via ORAL
  Filled 2024-07-19: qty 2

## 2024-07-19 MED ORDER — IOHEXOL 350 MG/ML SOLN
75.0000 mL | Freq: Once | INTRAVENOUS | Status: AC | PRN
  Administered 2024-07-19: 75 mL via INTRAVENOUS

## 2024-07-19 MED ORDER — MORPHINE SULFATE (PF) 2 MG/ML IV SOLN
2.0000 mg | Freq: Once | INTRAVENOUS | Status: AC
  Administered 2024-07-19: 2 mg via INTRAVENOUS
  Filled 2024-07-19: qty 1

## 2024-07-19 MED ORDER — ONDANSETRON HCL 4 MG/2ML IJ SOLN
4.0000 mg | Freq: Once | INTRAMUSCULAR | Status: AC
  Administered 2024-07-19: 4 mg via INTRAVENOUS
  Filled 2024-07-19: qty 2

## 2024-07-19 NOTE — ED Provider Notes (Signed)
 " [facility] Provider Note   CSN: [id number] Arrival date & time: 07/19/24  9365     Patient presents with: Sore Throat and Facial Pain   [name] is a 44 y.o. female who presents with 3 weeks of sinus congestion which she is currently on an antibiotic regimen for the past few days.  Her reported symptoms include wax/wane subjective fevers, chills, myalgias, headache, nasal congestion, sore throat, and nonproductive cough.  The patient reports associated fatigue and decreased appetite, with intermittent nausea but denies persistent vomiting or diarrhea.  Symptoms began gradually and have remained despite supportive care at home including over-the-counter cold/flu medication.  The patient denies chest pain, shortness of breath hemoptysis, syncope, confusion, neck stiffness, or focal neurological deficits.  The patient is in no acute distress.     Sore Throat       Prior to Admission medications  Medication Sig Start Date End Date Taking? Authorizing Provider  promethazine -dextromethorphan (PROMETHAZINE -DM) 6.25-15 MG/5ML syrup Take 2.5 mLs by mouth 4 (four) times daily as needed for up to 5 days for cough. 07/19/24 07/24/24 Yes Aarianna Hoadley L, PA  albuterol  (VENTOLIN  HFA) 108 (90 Base) MCG/ACT inhaler Inhale 1-2 puffs into the lungs every 6 (six) hours as needed for wheezing or shortness of breath. 12/04/23   Mayer, Jodi R, NP  amLODipine  (NORVASC ) 5 MG tablet Take 1 tablet (5 mg total) by mouth daily. 07/17/24   Christopher Savannah, PA-C  cefdinir  (OMNICEF ) 300 MG capsule Take 1 capsule (300 mg total) by mouth 2 (two) times daily. 07/17/24   Christopher Savannah, PA-C  medroxyPROGESTERone  (DEPO-PROVERA ) 150 MG/ML injection Inject 150 mg into the muscle every 3 (three) months. 12/06/20   [provider]  methocarbamol  (ROBAXIN ) 750 MG tablet Take 1 tablet (750 mg total) by mouth 3 (three) times daily as needed (muscle spasm/pain). 03/20/23   Bernard Drivers, MD  ondansetron  (ZOFRAN ) 4 MG tablet Take 1 tablet (4 mg total) by mouth every 8 (eight) hours as needed for nausea or vomiting. 10/25/22   Burnetta Aures, MD  pantoprazole  (PROTONIX ) 40 MG tablet Take 1 tablet (40 mg total) by mouth 2 (two) times daily. Take for 14 days. Patient not taking: Reported on 10/17/2022 04/21/21   Albertus Gordy HERO, MD  polyethylene glycol (MIRALAX  / GLYCOLAX ) 17 g packet Take 17 g by mouth daily. Patient taking differently: Take 17 g by mouth daily as needed for moderate constipation. 02/06/21   Raenelle Coria, MD  omeprazole  (PRILOSEC) 20 MG capsule Take 1 capsule (20 mg total) by mouth daily. Patient not taking: Reported on 12/23/2018 07/18/18 03/16/19  Rodriguez-Southworth, Kyra, PA-C    Allergies: Hydrocodone , Ibuprofen, Methocarbamol , Tizanidine , Amoxicillin -pot clavulanate, Aspirin, and Tramadol     Review of Systems  HENT:  Positive for sore throat.     Updated Vital Signs BP (!) 157/93   Pulse 85   Temp 98.4 F (36.9 C)   Resp 17   Ht 5' 5 (1.651 m)   Wt 96.6 kg   LMP  (LMP Unknown)   SpO2 100%   BMI 35.45 kg/m   Physical Exam Vitals and nursing note reviewed.  Constitutional:      General: She is awake. She is not in acute distress.    Appearance: Normal appearance. She is not toxic-appearing.  HENT:     Head: Normocephalic and atraumatic.     Right Ear: Hearing and ear canal normal.     Left Ear: Hearing and  ear canal normal.     Ears:     Comments: TM mildly erythematous bilaterally without bulging, effusion, purulence, or loss of landmarks.     Nose: Congestion and rhinorrhea present. Rhinorrhea is clear.     Right Sinus: Maxillary sinus tenderness present.     Left Sinus: Maxillary sinus tenderness present.     Mouth/Throat:     Mouth: Mucous membranes are moist.     Pharynx: Uvula midline. Posterior oropharyngeal erythema and postnasal drip present. No pharyngeal swelling, oropharyngeal exudate or uvula swelling.     Tonsils: No  tonsillar exudate or tonsillar abscesses. 2+ on the right. 2+ on the left.  Eyes:     General: Lids are normal. Vision grossly intact.     Extraocular Movements: Extraocular movements intact.     Conjunctiva/sclera: Conjunctivae normal.     Pupils: Pupils are equal, round, and reactive to light.  Cardiovascular:     Rate and Rhythm: Normal rate and regular rhythm.     Pulses:          Radial pulses are 2+ on the right side.  Pulmonary:     Effort: Pulmonary effort is normal.     Breath sounds: Normal breath sounds. No wheezing.     Comments: Patient has no difficulty speaking in complete sentences.  Abdominal:     General: Abdomen is flat.     Palpations: Abdomen is soft.     Tenderness: There is no abdominal tenderness.  Musculoskeletal:     Cervical back: Full passive range of motion without pain and neck supple. No rigidity. No spinous process tenderness.  Lymphadenopathy:     Cervical: Cervical adenopathy present.  Skin:    General: Skin is warm and dry.     Capillary Refill: Capillary refill takes less than 2 seconds.     Findings: No rash.  Neurological:     General: No focal deficit present.     Mental Status: She is alert.  Psychiatric:        Attention and Perception: Attention normal.        Mood and Affect: Mood normal.        Speech: Speech normal.     (all labs ordered are listed, but only abnormal results are displayed) Labs Reviewed  GROUP A STREP BY PCR  RESP PANEL BY RT-PCR (RSV, FLU A&B, COVID)  [id number]  CBC WITH DIFFERENTIAL/PLATELET  COMPREHENSIVE METABOLIC PANEL WITH GFR    EKG: None  Radiology: CT Soft Tissue Neck W Contrast Result Date: 07/19/2024 EXAM: CT NECK WITH CONTRAST 07/19/2024 09:57:29 AM TECHNIQUE: CT of the neck was performed with the administration of 75 mL of iohexol  (OMNIPAQUE ) 350 MG/ML injection. Multiplanar reformatted images are provided for review. Automated exposure control, iterative reconstruction, and/or weight based  adjustment of the mA/kV was utilized to reduce the radiation dose to as low as reasonably achievable. COMPARISON: None available. CLINICAL HISTORY: possible tonsilar abcess FINDINGS: AERODIGESTIVE TRACT: There is mild symmetric pharyngeal soft tissue swelling, but no evidence of peritonsillar abscess. No discrete mass. No edema. SALIVARY GLANDS: The parotid and submandibular glands are unremarkable. THYROID: Unremarkable. LYMPH NODES: No suspicious cervical lymphadenopathy. SOFT TISSUES: No mass or fluid collection. BONES: The patient is status post interbody fusion and anterior spinal fixation at C4-C5 and C5-C6. The orthopedic hardware is unremarkable. OTHER: There is mild mucosal disease within the floor of the left maxillary sinus. There is mild-to-moderate ethmoid air cell disease. Visualized mastoid air cells are well aerated. Visualized lungs  are clear. IMPRESSION: 1. No evidence of peritonsillar abscess. 2. Mild symmetric pharyngeal soft tissue swelling. 3. Mild mucosal disease within the floor of the left maxillary sinus and mild-to-moderate ethmoid air cell disease. 4. Status post C4-5 and C5-6 interbody fusion and anterior fixation with unremarkable hardware. Electronically signed by: Evalene Coho MD 07/19/2024 10:18 AM EST RP Workstation: [id number]     Procedures   Medications Ordered in the ED  ondansetron  (ZOFRAN ) injection 4 mg (4 mg Intravenous Given 07/19/24 0839)  morphine  (PF) 2 MG/ML injection 2 mg (2 mg Intravenous Given 07/19/24 0839)  iohexol  (OMNIPAQUE ) 350 MG/ML injection 75 mL (75 mLs Intravenous Contrast Given 07/19/24 0958)  acetaminophen  (TYLENOL ) tablet 1,000 mg (1,000 mg Oral Given 07/19/24 1035)                                 Medical Decision Making Amount and/or Complexity of Data Reviewed Labs: ordered. Decision-making details documented in ED Course. Radiology: ordered.  Risk OTC drugs. Prescription drug management.   Patient presents to the ED for: sore  throat This involves an extensive number of treatment options  Differential diagnosis includes: Sinusitis -currently on antibiotic regimen PTA Other infectious etiology  Additional history/records obtained and reviewed: Additional history obtained from  outside medical records External records from outside source obtained and reviewed including urgent care records from patient's recent visit/when she was placed on cefdinir  regimen.  Clinical Course as of 07/25/24 2244  [place] Jul 19, 2024  0808 Temp: 97.8 F (36.6 C) Febrile, vital stable, patient in no acute distress [ML]  0910 CBC with Differential WNL [ML]  0922 Comprehensive metabolic panel WNL [ML]  0922 Resp panel by RT-PCR (RSV, Flu A&B, Covid) Throat Negative [ML]  0922 Group A Strep by PCR if patient complains of sore throat. Not detected [ML]  0922 Given morphine  and ondansetron  for symptomatic relief-well-tolerated [ML]  1128 CT Soft Tissue Neck W Contrast [ML]    Clinical Course User Index [ML] Willma Duwaine CROME, PA    Data Reviewed / Actions Taken: Labs ordered/reviewed with my independent interpretation in ED course above. Imaging ordered -showed mild, bilateral pharyngeal swelling without peritonsillar abscess.  I agree with the radiologists interpretation.   Management / Treatments: See ED course above for medications, treatments administered, and clinical rationale.   Reevaluation of the patient after these medicines showed that the patient improved. I have reviewed the patients home medicines and have made adjustments as needed  ED Course / Reassessments: Problem List: Sinusitis 44 year old female presented for sore throat. Initial assessment included history, physical exam, and review of prior medical records. Laboratory and imaging studies during this visit were without acute finding.  Given patient's symptoms, patients sore throat is likely related to ongoing treatment for sinusitis given physical exam findings  and reports that sore throat appeared after postnasal drip got worse when she started antibiotic regimen, however stated that her sinus pain has notably decreased - there is low clinical suspicion any worsening infection based on reassuring vital signs, physical exam findings, and overall clinical appearance. The patient was treated symptomatically with analgesics and supportive care.  Patient to be kept on current antibiotic regimen based on symptom duration and shared decision making.  The patient remained stable during the ED course and was deemed appropriate for outpatient management.  Discharge planning include strict return precautions for new or worsening respiratory symptoms, persistent high fevers, chest pain, inability to tolerate oral  intake.  The patient was advised on continued supportive care at home, including hydration, rest, and analgesic use, and continued antibiotic regimen.  Disposition: Disposition: Discharge with close follow-up with PCP for further evaluation care Rationale for disposition: Stable for discharge The disposition plan and rationale were discussed with the patient at the bedside, all questions were addressed, and the patient demonstrated understanding.  This note was produced using Electronics Engineer. While I have reviewed and verified all clinical information, transcription errors may remain.      Final diagnoses:  Sore throat  Sinusitis, unspecified chronicity, unspecified location    ED Discharge Orders          Ordered    promethazine -dextromethorphan (PROMETHAZINE -DM) 6.25-15 MG/5ML syrup  4 times daily PRN        07/19/24 1115               Lanetta Figuero L, [place] 07/25/24 2253  "

## 2024-07-19 NOTE — Discharge Instructions (Addendum)
 Thank you for visiting the Emergency Department today. It was a pleasure to be part of your healthcare team.   Your were seen today for sore throat and cold symptoms  As discussed, hydrate, resume diet as tolerated, utilize over-the-counter sore throat lozenges, Tylenol  as needed for pain, and cough syrup for symptomatic relief. You should take your medications as directed. If you have any questions about your medicines, please call your pharmacy or healthcare provider.  As discussed, continue taking your current prescribed antibiotic regimen and follow-up with PCP if symptoms do not improve.  Thank you for trusting us  with your health.

## 2024-07-19 NOTE — ED Notes (Signed)
 Patient dc by RN. Patient ambulatory to lobby with no additional questions, to be taken home by daughter.

## 2024-07-19 NOTE — ED Triage Notes (Signed)
 Pt coming in reporting sore throat , facial pain and trouble managing secretions at night. Pt managing secretions in triage.

## 2024-07-21 ENCOUNTER — Ambulatory Visit: Attending: Cardiology | Admitting: Cardiology

## 2024-07-21 ENCOUNTER — Encounter: Payer: Self-pay | Admitting: Cardiology

## 2024-07-21 VITALS — BP 152/98 | Ht 65.0 in | Wt 217.8 lb

## 2024-07-21 DIAGNOSIS — I119 Hypertensive heart disease without heart failure: Secondary | ICD-10-CM | POA: Diagnosis not present

## 2024-07-21 MED ORDER — OLMESARTAN MEDOXOMIL 20 MG PO TABS: 20.0000 mg | ORAL_TABLET | Freq: Every day | ORAL | 3 refills | Status: DC

## 2024-07-21 MED ORDER — AMLODIPINE BESYLATE 5 MG PO TABS: 5.0000 mg | ORAL_TABLET | Freq: Every day | ORAL | 3 refills | Status: DC

## 2024-07-21 NOTE — Patient Instructions (Signed)
 Medication Instructions:  Your physician has recommended you make the following change in your medication:  START: Olmesartan  20 mg once daily  *If you need a refill on your cardiac medications before your next appointment, please call your pharmacy*   Testing/Procedures: Your physician has requested that you have an echocardiogram. Echocardiography is a painless test that uses sound waves to create images of your heart. It provides your doctor with information about the size and shape of your heart and how well your hearts chambers and valves are working. This procedure takes approximately one hour. There are no restrictions for this procedure. Please do NOT wear cologne, perfume, aftershave, or lotions (deodorant is allowed). Please arrive 15 minutes prior to your appointment time.  Please note: We ask at that you not bring children with you during ultrasound (echo/ vascular) testing. Due to room size and safety concerns, children are not allowed in the ultrasound rooms during exams. Our front office staff cannot provide observation of children in our lobby area while testing is being conducted. An adult accompanying a patient to their appointment will only be allowed in the ultrasound room at the discretion of the ultrasound technician under special circumstances. We apologize for any inconvenience.   Follow-Up: At [facility], you and your health needs are our priority.  As part of our continuing mission to provide you with exceptional heart care, our providers are all part of one team.  This team includes your primary Cardiologist (physician) and Advanced Practice Providers or APPs (Physician Assistants and Nurse Practitioners) who all work together to provide you with the care you need, when you need it.  Your next appointment:    Feb 3th 8am  Provider:   Kardie Tobb, DO     Other Instructions:

## 2024-07-21 NOTE — Progress Notes (Signed)
 " Cardiology Office Note:    Date:  07/21/2024   ID:  [name], DOB 04/01/1981, MRN [id number]  PCP:  Dannielle Bouchard, DO  Cardiologist:  Nelton Amsden, DO  Electrophysiologist:  None   Referring MD: Dannielle Bouchard, DO    I am ok  History of Present Illness:    [name] is a 44 y.o. female with a hx of Hypertension, Asthma, GERD, IBS here today to be evaluated for elevated blood pressure.   She reports elevated blood pressure over the past four weeks after an infection, with onset of facial pain a few days after being cleared for liposuction in early January. At urgent care her blood pressure was 166 mmHg and she was started on amoxicillin , which was stopped after two days due to hives on her ear. Two days ago she went to the ER with blood pressure 177 mmHg, received morphine  and other medications, and her blood pressure decreased to 153 mmHg. She is now on day four of an unspecified antibiotic and continues amlodipine . Her most recent blood pressure was about 150 mmHg. Prior readings include 138/80 mmHg in February 2023 and 140/98 mmHg in January 2026, and she reports she was not told she had hypertension at those visits. She has a family history of hypertension and an enlarged heart in her mother. She denies chest pain or shortness of breath. She reports multiple medication allergies, including hives with amoxicillin . Since October she has stopped alcohol and smoking and has reduced fried foods and increased water intake.    Past Medical History:  Diagnosis Date   Anemia    Anxiety    Arthritis    Asthma    Erosive esophagitis    distal esophagus, with low grade bleed 03/2009   GERD (gastroesophageal reflux disease)    Headache(784.0)    History of migraine headaches    IBS (irritable bowel syndrome)    Infection    UTI    Past Surgical History:  Procedure Laterality Date   ANTERIOR CERVICAL DECOMP/DISCECTOMY FUSION N/A 10/25/2022   Procedure: ANTERIOR CERVICAL  DECOMPRESSION/DISCECTOMY FUSION 2 LEVELS C4-6;  Surgeon: Burnetta Aures, MD;  Location: [facility] OR;  Service: Orthopedics;  Laterality: N/A;  3.5 hrs 3 C-Bed   BIOPSY  01/30/2021   Procedure: BIOPSY;  Surgeon: Albertus Gordy HERO, MD;  Location: [facility] ENDOSCOPY;  Service: Gastroenterology;;   ESOPHAGOGASTRODUODENOSCOPY (EGD) WITH PROPOFOL  N/A 01/30/2021   Procedure: ESOPHAGOGASTRODUODENOSCOPY (EGD) WITH PROPOFOL ;  Surgeon: Albertus Gordy HERO, MD;  Location: [facility] ENDOSCOPY;  Service: Gastroenterology;  Laterality: N/A;   LUMBAR LAMINECTOMY/DECOMPRESSION MICRODISCECTOMY Right 01/05/2016   Procedure: Right Lumbar three-four Microdiskectomy;  Surgeon: Rockey Peru, MD;  Location: [facility] NEURO ORS;  Service: Neurosurgery;  Laterality: Right;  right   WRIST SURGERY Right     Current Medications: Active Medications[1]   Allergies:   Hydrocodone , Ibuprofen, Methocarbamol , Tizanidine , Amoxicillin -pot clavulanate, Aspirin, and Tramadol    Social History   Socioeconomic History   Marital status: Married    Spouse name: Not on file   Number of children: Not on file   Years of education: Not on file   Highest education level: Not on file  Occupational History   Not on file  Tobacco Use   Smoking status: Former    Types: Cigars   Smokeless tobacco: Never  Vaping Use   Vaping status: Never Used  Substance and Sexual Activity   Alcohol use: No    Comment: occasional on holidays   Drug use: No  Sexual activity: Yes    Partners: Male    Birth control/protection: Injection, Condom  Other Topics Concern   Not on file  Social History Narrative   Lives in [place], with 2 children   Works at [employer], job requires frequent phone calling, works Tuesday - Friday   Social Drivers of Health   Tobacco Use: Medium Risk (07/21/2024)   Patient History    Smoking Tobacco Use: Former    Smokeless Tobacco Use: Never    Passive Exposure: Not on Actuary Strain: Low Risk (10/04/2022)   Received from [facility]   Overall Financial Resource Strain (CARDIA)    Difficulty of Paying Living Expenses: Not hard at all  Food Insecurity: Low Risk (09/08/2023)   Received from [facility]   Epic    Within the past 12 months, the food you bought just didn't last and you didn't have money to get more. : Never true    Within the past 12 months, you worried that your food would run out before you got money to buy more: Never true  Transportation Needs: No Transportation Needs (09/08/2023)   Received from [facility]    In the past 12 months, has lack of reliable transportation kept you from medical appointments, meetings, work or from getting things needed for daily living? : No  Physical Activity: Not on file  Stress: Not on file  Social Connections: Not on file  Depression (EYV7-0): Not on file  Alcohol Screen: Not on file  Housing: Low Risk (09/08/2023)   Received from [facility]   Epic    What is your living situation today?: I have a steady place to live    Think about the place you live. Do you have problems with any of the following? Choose all that apply:: None/None on this list  Utilities: Low Risk (09/08/2023)   Received from [facility]   Utilities    In the past 12 months has the electric, gas, oil, or water company threatened to shut off services in your home? : No  Health Literacy: Not on file     Family History: The patient's family history includes Asthma in her daughter and mother; Cancer in her maternal grandmother; Heart disease in her father; Migraines in her father and mother.  ROS:   Review of Systems  Constitution: Negative for decreased appetite, fever and weight gain.  HENT: Negative for congestion, ear discharge, hoarse voice and sore throat.   Eyes: Negative for discharge, redness, vision loss in right eye and visual halos.  Cardiovascular: Negative for chest pain, dyspnea on exertion, leg swelling, orthopnea and palpitations.  Respiratory: Negative  for cough, hemoptysis, shortness of breath and snoring.   Endocrine: Negative for heat intolerance and polyphagia.  Hematologic/Lymphatic: Negative for bleeding problem. Does not bruise/bleed easily.  Skin: Negative for flushing, nail changes, rash and suspicious lesions.  Musculoskeletal: Negative for arthritis, joint pain, muscle cramps, myalgias, neck pain and stiffness.  Gastrointestinal: Negative for abdominal pain, bowel incontinence, diarrhea and excessive appetite.  Genitourinary: Negative for decreased libido, genital sores and incomplete emptying.  Neurological: Negative for brief paralysis, focal weakness, headaches and loss of balance.  Psychiatric/Behavioral: Negative for altered mental status, depression and suicidal ideas.  Allergic/Immunologic: Negative for HIV exposure and persistent infections.    EKGs/Labs/Other Studies Reviewed:    The following studies were reviewed today:   EKG:  The ekg ordered today demonstrates   Recent Labs: 07/19/2024: ALT 15; BUN  9; Creatinine, Ser 0.80; Hemoglobin 12.8; Platelets 344; Potassium 4.2; Sodium 142  Recent Lipid Panel    Component Value Date/Time   CHOL 158 12/14/2014 0958   TRIG 104 12/14/2014 0958   HDL 61 12/14/2014 0958   CHOLHDL 2.6 12/14/2014 0958   VLDL 21 12/14/2014 0958   LDLCALC 76 12/14/2014 0958    Physical Exam:    VS:  BP (!) 152/98   Ht 5' 5 (1.651 m)   Wt 217 lb 12.8 oz (98.8 kg)   LMP  (LMP Unknown)   BMI 36.24 kg/m     Wt Readings from Last 3 Encounters:  07/21/24 217 lb 12.8 oz (98.8 kg)  07/19/24 213 lb (96.6 kg)  03/20/23 214 lb (97.1 kg)     GEN: Well nourished, well developed in no acute distress HEENT: Normal NECK: No JVD; No carotid bruits LYMPHATICS: No lymphadenopathy CARDIAC: S1S2 noted,RRR, no murmurs, rubs, gallops RESPIRATORY:  Clear to auscultation without rales, wheezing or rhonchi  ABDOMEN: Soft, non-tender, non-distended, +bowel sounds, no guarding. EXTREMITIES: No edema,  No cyanosis, no clubbing MUSCULOSKELETAL:  No deformity  SKIN: Warm and dry NEUROLOGIC:  Alert and oriented x 3, non-focal PSYCHIATRIC:  Normal affect, good insight  ASSESSMENT:    1. Hypertensive heart disease without heart failure   2. Morbid obesity (HCC)    PLAN:     Hypertension Chronic hypertension with recent exacerbation, readings up to 177/96 mmHg. Treated with amlodipine  5 mg. Lifestyle modifications implemented. - Continue amlodipine  5 mg daily. - Started olmesartan  for better control. - Follow-up on February 3rd, 2026, at 8 AM. - Ordered echocardiogram to evaluate heart function.  Left ventricular hypertrophy EKG suggests thickening of heart muscle, likely due to long-standing hypertension. - Ordered echocardiogram to assess extent of hypertrophy and heart function.  The patient is in agreement with the above plan. The patient left the office in stable condition.  The patient will follow up in   Medication Adjustments/Labs and Tests Ordered: Current medicines are reviewed at length with the patient today.  Concerns regarding medicines are outlined above.  Orders Placed This Encounter  Procedures   ECHOCARDIOGRAM COMPLETE   Meds ordered this encounter  Medications   olmesartan  (BENICAR ) 20 MG tablet    Sig: Take 1 tablet (20 mg total) by mouth daily.    Dispense:  90 tablet    Refill:  3   amLODipine  (NORVASC ) 5 MG tablet    Sig: Take 1 tablet (5 mg total) by mouth daily.    Dispense:  90 tablet    Refill:  3    Patient Instructions  Medication Instructions:  Your physician has recommended you make the following change in your medication:  START: Olmesartan  20 mg once daily  *If you need a refill on your cardiac medications before your next appointment, please call your pharmacy*   Testing/Procedures: Your physician has requested that you have an echocardiogram. Echocardiography is a painless test that uses sound waves to create images of your heart.  It provides your doctor with information about the size and shape of your heart and how well your hearts chambers and valves are working. This procedure takes approximately one hour. There are no restrictions for this procedure. Please do NOT wear cologne, perfume, aftershave, or lotions (deodorant is allowed). Please arrive 15 minutes prior to your appointment time.  Please note: We ask at that you not bring children with you during ultrasound (echo/ vascular) testing. Due to room size and safety concerns, children  are not allowed in the ultrasound rooms during exams. Our front office staff cannot provide observation of children in our lobby area while testing is being conducted. An adult accompanying a patient to their appointment will only be allowed in the ultrasound room at the discretion of the ultrasound technician under special circumstances. We apologize for any inconvenience.   Follow-Up: At [facility], you and your health needs are our priority.  As part of our continuing mission to provide you with exceptional heart care, our providers are all part of one team.  This team includes your primary Cardiologist (physician) and Advanced Practice Providers or APPs (Physician Assistants and Nurse Practitioners) who all work together to provide you with the care you need, when you need it.  Your next appointment:    Feb 3th 8am  Provider:   Cortnee Steinmiller, DO     Other Instructions:            Adopting a Healthy Lifestyle.  Know what a healthy weight is for you (roughly BMI <25) and aim to maintain this   Aim for 7+ servings of fruits and vegetables daily   65-80+ fluid ounces of water or unsweet tea for healthy kidneys   Limit to max 1 drink of alcohol per day; avoid smoking/tobacco   Limit animal fats in diet for cholesterol and heart health - choose grass fed whenever available   Avoid highly processed foods, and foods high in saturated/trans fats   Aim for low  stress - take time to unwind and care for your mental health   Aim for 150 min of moderate intensity exercise weekly for heart health, and weights twice weekly for bone health   Aim for 7-9 hours of sleep daily   When it comes to diets, agreement about the perfect plan isnt easy to find, even among the experts. Experts at the [facility] of [employer] developed an idea known as the Healthy Eating Plate. Just imagine a plate divided into logical, healthy portions.   The emphasis is on diet quality:   Load up on vegetables and fruits - one-half of your plate: Aim for color and variety, and remember that potatoes dont count.   Go for whole grains - one-quarter of your plate: Whole wheat, barley, wheat berries, quinoa, oats, brown rice, and foods made with them. If you want pasta, go with whole wheat pasta.   Protein power - one-quarter of your plate: Fish, chicken, beans, and nuts are all healthy, versatile protein sources. Limit red meat.   The diet, however, does go beyond the plate, offering a few other suggestions.   Use healthy plant oils, such as olive, canola, soy, corn, sunflower and peanut. Check the labels, and avoid partially hydrogenated oil, which have unhealthy trans fats.   If youre thirsty, drink water. Coffee and tea are good in moderation, but skip sugary drinks and limit milk and dairy products to one or two daily servings.   The type of carbohydrate in the diet is more important than the amount. Some sources of carbohydrates, such as vegetables, fruits, whole grains, and beans-are healthier than others.   Finally, stay active  Signed, Dewaine Morocho, DO  07/21/2024 12:09 PM    [facility]     [1]  Current Meds  Medication Sig   albuterol  (VENTOLIN  HFA) 108 (90 Base) MCG/ACT inhaler Inhale 1-2 puffs into the lungs every 6 (six) hours as needed for wheezing or shortness of breath.  cefdinir  (OMNICEF ) 300 MG capsule Take 1 capsule (300  mg total) by mouth 2 (two) times daily.   medroxyPROGESTERone  (DEPO-PROVERA ) 150 MG/ML injection Inject 150 mg into the muscle every 3 (three) months.   olmesartan  (BENICAR ) 20 MG tablet Take 1 tablet (20 mg total) by mouth daily.   [DISCONTINUED] amLODipine  (NORVASC ) 5 MG tablet Take 1 tablet (5 mg total) by mouth daily.   "

## 2024-07-23 ENCOUNTER — Ambulatory Visit: Attending: Cardiology

## 2024-07-23 DIAGNOSIS — I119 Hypertensive heart disease without heart failure: Secondary | ICD-10-CM

## 2024-07-23 LAB — ECHOCARDIOGRAM COMPLETE
Area-P 1/2: 5.84 cm2
S' Lateral: 3.7 cm

## 2024-07-24 ENCOUNTER — Encounter: Payer: Self-pay | Admitting: Family Medicine

## 2024-07-24 ENCOUNTER — Ambulatory Visit: Admitting: Family Medicine

## 2024-07-24 VITALS — BP 148/88 | Temp 98.2°F | Ht 65.0 in | Wt 216.0 lb

## 2024-07-24 DIAGNOSIS — Z7689 Persons encountering health services in other specified circumstances: Secondary | ICD-10-CM

## 2024-07-24 DIAGNOSIS — I10 Essential (primary) hypertension: Secondary | ICD-10-CM

## 2024-07-24 DIAGNOSIS — G4709 Other insomnia: Secondary | ICD-10-CM | POA: Diagnosis not present

## 2024-07-24 NOTE — Patient Instructions (Addendum)
 Welcome to [facility]!  Thank you for choosing us  for your Primary Care needs.   We offer in person and video appointments for your convenience. You may call our office to schedule appointments, or you may schedule appointments with me through MyChart.   The best way to get in contact with me is via MyChart message. This will get to me faster than a phone call, unless there is an emergency, then please call 911.  The lab is located downstairs in the Sports Medicine building, we also have xray available there.   Follow-up with me for new or worsening symptoms.

## 2024-07-24 NOTE — Progress Notes (Signed)
 "  New Patient Visit  Subjective:     Patient ID: [name], female    DOB: Dec 18, 1980, 44 y.o.   MRN: [id number]  No chief complaint on file.   HPI  Discussed the use of AI scribe software for clinical note transcription with the patient, who gave verbal consent to proceed.  History of Present Illness [name] is a 44 year old female who presents for establishment of care and management of high blood pressure.  Hypertension - First identified with very high blood pressure during a recent pre-operative evaluation for liposuction, resulting in procedure cancellation and recommendation for cardiac clearance. - Previously informed of elevated blood pressure since 2021, but not started on antihypertensive therapy until a recent urgent care visit. - Blood pressure at urgent care was 166 mmHg; initiated on olmesartan  20 mg and amlodipine  5 mg daily approximately one week ago. - Home blood pressure readings average 157/90 mmHg, with the most recent at 152/90 mmHg. - Morning headaches present, with improvement over the past two weeks. - No vision changes, peripheral edema, or snoring.  Recent infection and sleep disturbance - Recent sinus infection treated with amoxicillin  and promethazine . - Difficulty sleeping; uses melatonin for sleep. - Not currently using cough syrup or albuterol .  Cardiac history and family history - Mother died from an enlarged heart. - Recent echocardiogram performed.  Lifestyle factors - Does not consume alcohol. - Stopped smoking marijuana in October. - Works from home for [employer], spending long hours at the computer.     ROS Per HPI  Outpatient Encounter Medications as of 07/24/2024  Medication Sig   albuterol  (VENTOLIN  HFA) 108 (90 Base) MCG/ACT inhaler Inhale 1-2 puffs into the lungs every 6 (six) hours as needed for wheezing or shortness of breath.   amLODipine  (NORVASC ) 5 MG tablet Take 1 tablet (5 mg total) by mouth daily.    medroxyPROGESTERone  (DEPO-PROVERA ) 150 MG/ML injection Inject 150 mg into the muscle every 3 (three) months.   olmesartan  (BENICAR ) 20 MG tablet Take 1 tablet (20 mg total) by mouth daily.   [DISCONTINUED] cefdinir  (OMNICEF ) 300 MG capsule Take 1 capsule (300 mg total) by mouth 2 (two) times daily. (Patient not taking: Reported on 07/24/2024)   [DISCONTINUED] omeprazole  (PRILOSEC) 20 MG capsule Take 1 capsule (20 mg total) by mouth daily. (Patient not taking: Reported on 12/23/2018)   No facility-administered encounter medications on file as of 07/24/2024.    Past Medical History:  Diagnosis Date   Anemia    Anxiety    Arthritis    Asthma    Erosive esophagitis    distal esophagus, with low grade bleed 03/2009   GERD (gastroesophageal reflux disease)    Headache(784.0)    History of migraine headaches    IBS (irritable bowel syndrome)    Infection    UTI    Past Surgical History:  Procedure Laterality Date   ANTERIOR CERVICAL DECOMP/DISCECTOMY FUSION N/A 10/25/2022   Procedure: ANTERIOR CERVICAL DECOMPRESSION/DISCECTOMY FUSION 2 LEVELS C4-6;  Surgeon: Burnetta Aures, MD;  Location: [facility] OR;  Service: Orthopedics;  Laterality: N/A;  3.5 hrs 3 C-Bed   BIOPSY  01/30/2021   Procedure: BIOPSY;  Surgeon: Albertus Gordy HERO, MD;  Location: [facility] ENDOSCOPY;  Service: Gastroenterology;;   ESOPHAGOGASTRODUODENOSCOPY (EGD) WITH PROPOFOL  N/A 01/30/2021   Procedure: ESOPHAGOGASTRODUODENOSCOPY (EGD) WITH PROPOFOL ;  Surgeon: Albertus Gordy HERO, MD;  Location: [facility] ENDOSCOPY;  Service: Gastroenterology;  Laterality: N/A;   LUMBAR LAMINECTOMY/DECOMPRESSION MICRODISCECTOMY Right 01/05/2016   Procedure:  Right Lumbar three-four Microdiskectomy;  Surgeon: Rockey Peru, MD;  Location: [facility] NEURO ORS;  Service: Neurosurgery;  Laterality: Right;  right   WRIST SURGERY Right     Family History  Problem Relation Age of Onset   Migraines Mother    Asthma Mother    Migraines Father    Heart disease Father    Cancer Maternal  Grandmother    Asthma Daughter     Social History   Socioeconomic History   Marital status: Married    Spouse name: Not on file   Number of children: Not on file   Years of education: Not on file   Highest education level: Not on file  Occupational History   Not on file  Tobacco Use   Smoking status: Former    Types: Cigars   Smokeless tobacco: Never  Vaping Use   Vaping status: Never Used  Substance and Sexual Activity   Alcohol use: No    Comment: occasional on holidays   Drug use: No   Sexual activity: Yes    Partners: Male    Birth control/protection: Injection, Condom  Other Topics Concern   Not on file  Social History Narrative   Lives in [place], with 2 children   Works at [employer], job requires frequent phone calling, works Tuesday - Friday   Social Drivers of Health   Tobacco Use: Medium Risk (07/24/2024)   Patient History    Smoking Tobacco Use: Former    Smokeless Tobacco Use: Never    Passive Exposure: Not on Actuary Strain: Low Risk (10/04/2022)   Received from [facility]   Overall Financial Resource Strain (CARDIA)    Difficulty of Paying Living Expenses: Not hard at all  Food Insecurity: Low Risk (09/08/2023)   Received from [facility]   Epic    Within the past 12 months, the food you bought just didn't last and you didn't have money to get more. : Never true    Within the past 12 months, you worried that your food would run out before you got money to buy more: Never true  Transportation Needs: No Transportation Needs (09/08/2023)   Received from [facility]    In the past 12 months, has lack of reliable transportation kept you from medical appointments, meetings, work or from getting things needed for daily living? : No  Physical Activity: Not on file  Stress: Not on file  Social Connections: Not on file  Intimate Partner Violence: Not on file  Depression (EYV7-0): Not on file  Alcohol Screen: Not on  file  Housing: Low Risk (09/08/2023)   Received from [facility]   Epic    What is your living situation today?: I have a steady place to live    Think about the place you live. Do you have problems with any of the following? Choose all that apply:: None/None on this list  Utilities: Low Risk (09/08/2023)   Received from [facility]   Utilities    In the past 12 months has the electric, gas, oil, or water company threatened to shut off services in your home? : No  Health Literacy: Not on file       Objective:    BP (!) 148/88   Temp 98.2 F (36.8 C)   Ht 5' 5 (1.651 m)   Wt 216 lb (98 kg)   LMP  (LMP Unknown)   BMI 35.94 kg/m    Physical Exam Vitals  and nursing note reviewed.  Constitutional:      General: She is not in acute distress.    Appearance: She is obese.  HENT:     Head: Normocephalic and atraumatic.     Right Ear: Tympanic membrane and external ear normal.     Left Ear: Tympanic membrane and external ear normal.     Nose: Nose normal.     Mouth/Throat:     Mouth: Mucous membranes are moist.     Pharynx: Oropharynx is clear.  Eyes:     Extraocular Movements: Extraocular movements intact.     Pupils: Pupils are equal, round, and reactive to light.  Cardiovascular:     Rate and Rhythm: Normal rate and regular rhythm.     Pulses: Normal pulses.     Heart sounds: Normal heart sounds.  Pulmonary:     Effort: Pulmonary effort is normal. No respiratory distress.     Breath sounds: Normal breath sounds. No wheezing, rhonchi or rales.  Musculoskeletal:        General: Normal range of motion.     Cervical back: Normal range of motion.     Right lower leg: No edema.     Left lower leg: No edema.  Lymphadenopathy:     Cervical: No cervical adenopathy.  Neurological:     General: No focal deficit present.     Mental Status: She is alert and oriented to person, place, and time.  Psychiatric:        Mood and Affect: Mood normal.        Thought Content: Thought  content normal.     No results found for any visits on 07/24/24.      Assessment & Plan:   Assessment and Plan Assessment & Plan Establish Care Recent cessation of smoking and alcohol. No caffeine  use. Regular Depo-Provera  use. - Continue lifestyle modifications, including cessation of smoking and alcohol. - Continue Depo-Provera  for contraception.  Essential Hypertension Blood pressure elevated, possibly influenced by sinusitis and medication use. No end-organ damage. Family history of cardiomegaly. - Continue olmesartan  20 mg daily. - Continue amlodipine  5 mg daily. - Monitor blood pressure at home and record readings. - Avoid decongestants and albuterol  if possible. - Follow up with cardiology as scheduled in 2 weeks for BP follow up  Insomnia Likely related to sinusitis, decongestants, anxiety, and health concerns. No sleep apnea. - Continue melatonin as needed for sleep. - Monitor sleep patterns and report persistent issues.       No orders of the defined types were placed in this encounter.    No orders of the defined types were placed in this encounter.   Return if symptoms worsen or fail to improve.  Corean LITTIE Ku, FNP   "

## 2024-07-25 ENCOUNTER — Emergency Department (HOSPITAL_COMMUNITY)
Admission: EM | Admit: 2024-07-25 | Discharge: 2024-07-25 | Disposition: A | Discharge status: Home / Self Care | Attending: Emergency Medicine | Admitting: Emergency Medicine

## 2024-07-25 ENCOUNTER — Ambulatory Visit: Payer: Self-pay | Admitting: Cardiology

## 2024-07-25 DIAGNOSIS — J0110 Acute frontal sinusitis, unspecified: Secondary | ICD-10-CM | POA: Diagnosis not present

## 2024-07-25 DIAGNOSIS — J029 Acute pharyngitis, unspecified: Secondary | ICD-10-CM | POA: Diagnosis present

## 2024-07-25 MED ORDER — DOXYCYCLINE HYCLATE 100 MG PO CAPS: 100.0000 mg | ORAL_CAPSULE | Freq: Two times a day (BID) | ORAL | 0 refills | Status: AC

## 2024-07-25 MED ORDER — LIDOCAINE VISCOUS HCL 2 % MT SOLN: 15.0000 mL | OROMUCOSAL | 0 refills | Status: AC | PRN

## 2024-07-25 MED ORDER — LIDOCAINE VISCOUS HCL 2 % MT SOLN
15.0000 mL | Freq: Once | OROMUCOSAL | Status: AC
  Administered 2024-07-25: 15 mL via OROMUCOSAL
  Filled 2024-07-25: qty 15

## 2024-07-25 MED ORDER — LORATADINE 10 MG PO TABS: 10.0000 mg | ORAL_TABLET | Freq: Every day | ORAL | 0 refills | Status: AC

## 2024-07-25 MED ORDER — DEXAMETHASONE SOD PHOSPHATE PF 10 MG/ML IJ SOLN
10.0000 mg | Freq: Once | INTRAMUSCULAR | Status: AC
  Administered 2024-07-25: 10 mg via INTRAMUSCULAR
  Filled 2024-07-25: qty 1

## 2024-07-25 NOTE — ED Triage Notes (Addendum)
 Pt finished antibiotics yesterday for sore throat still in pain. Airway maintained.

## 2024-07-25 NOTE — ED Provider Notes (Signed)
 " [facility] Provider Note   CSN: [id number] Arrival date & time: 07/25/24  9448     Patient presents with: Sore Throat   [name] is a 44 y.o. female with past medical history of GERD, HA, erosive esophagitis presenting to ER with complaint of sore throat, right ear pain and right facial pain. She reports that this has been ongoing for about 3 weeks. She reports that she was placed of Cefdinir  and this temporarily helped, but she has now finished the medication and this now seem to be getting worse. Denies CP, SOB, fever or cough. She is tolerating secretions and has normal phonation. She is frustrated by duration of symptoms.     Sore Throat       Prior to Admission medications  Medication Sig Start Date End Date Taking? Authorizing Provider  albuterol  (VENTOLIN  HFA) 108 (90 Base) MCG/ACT inhaler Inhale 1-2 puffs into the lungs every 6 (six) hours as needed for wheezing or shortness of breath. 12/04/23   Mayer, Jodi R, NP  amLODipine  (NORVASC ) 5 MG tablet Take 1 tablet (5 mg total) by mouth daily. 07/21/24   Tobb, Kardie, DO  medroxyPROGESTERone  (DEPO-PROVERA ) 150 MG/ML injection Inject 150 mg into the muscle every 3 (three) months. 12/06/20   [provider]  olmesartan  (BENICAR ) 20 MG tablet Take 1 tablet (20 mg total) by mouth daily. 07/21/24   Tobb, Kardie, DO  omeprazole  (PRILOSEC) 20 MG capsule Take 1 capsule (20 mg total) by mouth daily. Patient not taking: Reported on 12/23/2018 07/18/18 03/16/19  Rodriguez-Southworth, Kyra, PA-C    Allergies: Hydrocodone , Ibuprofen, Methocarbamol , Tizanidine , Amoxicillin -pot clavulanate, Aspirin, and Tramadol     Review of Systems  HENT:  Positive for congestion.     Updated Vital Signs BP (!) 151/94 (BP Location: Right Arm)   Pulse (!) 113   Temp 98.2 F (36.8 C) (Oral)   Resp (!) 22   LMP  (LMP Unknown)   SpO2 100%   Physical Exam Vitals and nursing note reviewed.   Constitutional:      General: She is not in acute distress.    Appearance: She is not toxic-appearing.  HENT:     Head: Normocephalic and atraumatic.     Comments: Pain over right front sinus.     Right Ear: There is impacted cerumen.     Left Ear: There is no impacted cerumen.     Nose: Congestion and rhinorrhea present.     Mouth/Throat:     Pharynx: No oropharyngeal exudate or posterior oropharyngeal erythema.     Comments: Uvular midline, no PTA. No erythema or exudate.  Eyes:     General: No scleral icterus.    Conjunctiva/sclera: Conjunctivae normal.  Cardiovascular:     Rate and Rhythm: Normal rate and regular rhythm.     Pulses: Normal pulses.     Heart sounds: Normal heart sounds.  Pulmonary:     Effort: Pulmonary effort is normal. No respiratory distress.     Breath sounds: Normal breath sounds.  Abdominal:     General: Abdomen is flat. Bowel sounds are normal.     Palpations: Abdomen is soft.     Tenderness: There is no abdominal tenderness.  Skin:    General: Skin is warm and dry.     Findings: No lesion.  Neurological:     General: No focal deficit present.     Mental Status: She is alert and oriented to person, place, and time.  Mental status is at baseline.     (all labs ordered are listed, but only abnormal results are displayed) Labs Reviewed - No data to display  EKG: None  Radiology:    Procedures   Medications Ordered in the ED - No data to display                                  Medical Decision Making Risk OTC drugs. Prescription drug management.   This patient presents to the ED for concern of URI, this involves an extensive number of treatment options, and is a complaint that carries with it a high risk of complications and morbidity.  The differential diagnosis includes Ludwig's angina, peritonsillar abscess, strep throat, viral URI, pneumonia   Problem List / ED Course / Critical interventions / Medication management  Patient  presents with approximately 3 to 4 weeks of URI-like symptoms.  On arrival she is hemodynamically stable and well-appearing.  She denies any chest pain and her lungs are clear to auscultation.  Denies any cough.  She primarily complains of continued sore/scratchy throat, right ear pain and right sinus pain.  She continues to have congestion.  She is handling secretions with normal phonation.  No anterior neck swelling.  No sign of peritonsillar abscess. When symptoms initially started 07/19/2024 she was tested for flu, covid, RSV and strep throat thus will not repeat testing today. Was seen by PCP yesterday and in ED 1/18 for similar symptoms. Had CT soft tissue of neck showing no evidence of PTA, mild symmetric pharyngeal soft tissue swelling and mild left maxillary sinus disease. Given recent reassuring CT imaging, will not repeat today.   I ordered medication including Lidoderm  and decadron  Reevaluation of the patient after these medicines showed that the patient improved I have reviewed the patients home medicines and have made adjustments as needed. Patient stable and well appearing at this time. Will trial decadron  and close ENT follow up. Given return precautions.         Final diagnoses:  Subacute frontal sinusitis    ED Discharge Orders          Ordered    doxycycline  (VIBRAMYCIN ) 100 MG capsule  2 times daily        07/25/24 0707    lidocaine  (XYLOCAINE ) 2 % solution  Every  3 hours PRN        07/25/24 0707    loratadine  (CLARITIN ) 10 MG tablet  Daily        07/25/24 0707               Manley Fason, Warren SAILOR, PA-C 07/25/24 9283    Tegeler, Lonni PARAS, MD 07/25/24 1528  "

## 2024-07-25 NOTE — Discharge Instructions (Signed)
 Please call Dr Trena office to schedule an appointment with ears, nose and throat team.   You were given lidocaine  for sore throat and decadron  for congestion/inflammation.   I would like you to restart antibiotic. For sore throat try warm tea with honey and lemon, use cough drops. You can also use over the counter anti-histamines with Claritin .   Return to Ed with new or worsening symptoms.

## 2024-07-28 NOTE — Telephone Encounter (Signed)
 Results/message from Dr. Emmette Harms has been released to MyChart. A letter is being sent to the last known home address.

## 2024-08-03 ENCOUNTER — Telehealth: Payer: Self-pay

## 2024-08-04 ENCOUNTER — Encounter: Payer: Self-pay | Admitting: Cardiology

## 2024-08-04 ENCOUNTER — Ambulatory Visit: Admitting: Cardiology

## 2024-08-04 VITALS — BP 130/80 | HR 87 | Ht 65.0 in | Wt 218.4 lb

## 2024-08-04 DIAGNOSIS — I119 Hypertensive heart disease without heart failure: Secondary | ICD-10-CM | POA: Diagnosis not present

## 2024-08-04 MED ORDER — AMLODIPINE-OLMESARTAN 5-20 MG PO TABS: 1.0000 | ORAL_TABLET | Freq: Every day | ORAL | 3 refills | Status: AC

## 2024-08-06 ENCOUNTER — Ambulatory Visit

## 2024-08-06 ENCOUNTER — Encounter: Payer: Self-pay | Admitting: Family Medicine

## 2024-08-06 ENCOUNTER — Ambulatory Visit: Admitting: Family Medicine

## 2024-08-06 VITALS — BP 138/86 | HR 96 | Temp 98.7°F | Resp 20 | Ht 65.0 in | Wt 216.0 lb

## 2024-08-06 DIAGNOSIS — G4709 Other insomnia: Secondary | ICD-10-CM

## 2024-08-06 DIAGNOSIS — Z6836 Body mass index (BMI) 36.0-36.9, adult: Secondary | ICD-10-CM | POA: Insufficient documentation

## 2024-08-06 DIAGNOSIS — I10 Essential (primary) hypertension: Secondary | ICD-10-CM | POA: Insufficient documentation

## 2024-08-06 DIAGNOSIS — Z01818 Encounter for other preprocedural examination: Secondary | ICD-10-CM

## 2024-08-06 LAB — CBC WITH DIFFERENTIAL/PLATELET
Basophils Absolute: 0.1 10*3/uL (ref 0.0–0.1)
Basophils Relative: 0.8 % (ref 0.0–3.0)
Eosinophils Absolute: 0.2 10*3/uL (ref 0.0–0.7)
Eosinophils Relative: 2.3 % (ref 0.0–5.0)
HCT: 38.3 % (ref 36.0–46.0)
Hemoglobin: 12.7 g/dL (ref 12.0–15.0)
Lymphocytes Relative: 39.5 % (ref 12.0–46.0)
Lymphs Abs: 3 10*3/uL (ref 0.7–4.0)
MCHC: 33 g/dL (ref 30.0–36.0)
MCV: 92.8 fl (ref 78.0–100.0)
Monocytes Absolute: 0.6 10*3/uL (ref 0.1–1.0)
Monocytes Relative: 7.7 % (ref 3.0–12.0)
Neutro Abs: 3.8 10*3/uL (ref 1.4–7.7)
Neutrophils Relative %: 49.7 % (ref 43.0–77.0)
Platelets: 401 10*3/uL — ABNORMAL HIGH (ref 150.0–400.0)
RBC: 4.13 Mil/uL (ref 3.87–5.11)
RDW: 14.2 % (ref 11.5–15.5)
WBC: 7.6 10*3/uL (ref 4.0–10.5)

## 2024-08-06 LAB — COMPREHENSIVE METABOLIC PANEL WITH GFR
ALT: 14 U/L (ref 3–35)
AST: 16 U/L (ref 5–37)
Albumin: 4.4 g/dL (ref 3.5–5.2)
Alkaline Phosphatase: 63 U/L (ref 39–117)
BUN: 9 mg/dL (ref 6–23)
CO2: 27 meq/L (ref 19–32)
Calcium: 9.6 mg/dL (ref 8.4–10.5)
Chloride: 106 meq/L (ref 96–112)
Creatinine, Ser: 0.8 mg/dL (ref 0.40–1.20)
GFR: 88.82 mL/min
Glucose, Bld: 84 mg/dL (ref 70–99)
Potassium: 3.6 meq/L (ref 3.5–5.1)
Sodium: 139 meq/L (ref 135–145)
Total Bilirubin: 0.2 mg/dL (ref 0.2–1.2)
Total Protein: 7.8 g/dL (ref 6.0–8.3)

## 2024-08-06 LAB — PROTIME-INR
INR: 1.1 ratio — ABNORMAL HIGH (ref 0.8–1.0)
Prothrombin Time: 12.5 s (ref 9.6–13.1)

## 2024-08-06 LAB — APTT: aPTT: 35.4 s (ref 25.4–36.8)

## 2024-08-06 LAB — TSH: TSH: 0.3 u[IU]/mL — ABNORMAL LOW (ref 0.35–5.50)

## 2024-08-06 LAB — HEMOGLOBIN A1C: Hgb A1c MFr Bld: 5.5 % (ref 4.6–6.5)

## 2024-08-06 LAB — T4, FREE: Free T4: 0.73 ng/dL (ref 0.60–1.60)

## 2024-08-06 NOTE — Patient Instructions (Addendum)
 We are checking labs today, will be in contact with any results that require further attention  We are getting an xray today. We will be in contact with any abnormal results that require further attention.  Follow up with me for a recheck after your procedure.

## 2024-08-06 NOTE — Progress Notes (Signed)
 "  AI Surgery Clearance  Subjective:     Patient ID: [name], female    DOB: [birth year], 44 y.o.   MRN: [id number]  Chief Complaint  Patient presents with   Pre-op Exam    HPI  Discussed the use of AI scribe software for clinical note transcription with the patient, who gave verbal consent to proceed.  History of Present Illness [name] is a 44 year old female who presents for pre-operative evaluation and lab work prior to surgery on February 23rd. She is accompanied by her mother.  Pre-operative assessment - Scheduled for surgery on February 23rd - Requires updated pre-operative laboratory testing; last labs performed one month ago - Inquires if chest x-ray is necessary, clarifying that surgery is not related to chest or breast - Recently underwent echocardiogram  Hypertension - Recent blood pressure measured at 130 mmHg - Antihypertensive regimen simplified to a single medication  Sleep disturbance - Difficulty initiating or maintaining sleep - Uses melatonin gummies for sleep - Considering use of melatonin pillow spray  Logistical concerns - Expresses concern about navigating to laboratory and x-ray facilities due to unfamiliarity with the location     Pt is a 44 y.o. female who is here for preoperative clearance for liposuction procedure.  1) High Risk Cardiac Conditions:  1) Recent MI - No.  2) Decompensated Heart Failure - No.  3) Unstable angina - No.  4) Symptomatic arrythmia - No.  5) Sx Valvular Disease - No.  2) Intermediate Risk Factors: DM, CKD, CVA, CHF, CAD - No.  2) Functional Status: > 4 mets (Walk, run, climb stairs) Yes.  [name]   3) Surgery Specific Risk:   Low (Endoscopic, Cataract, Breast )  4) Further Noninvasive evaluation:   1) EKG - Yes.     Hx of MI, CVA, CAD, DM, CKD  2) Echo - No.   Worsening dyspnea or CHF without an echo in the past year  3) Stress Testing - Active Cardiac Disease - No.  4) CXR Yes.     If  asymptomatic, healthy, no respiratory symptoms it is not needed  5)PFTs No.   OSA, OHS, or significant cardiopulmonary history  5) Need for medical therapy - Beta Blocker, Statins indicated ? No.  ROS Per HPI      Objective:    BP 138/86   Pulse 96   Temp 98.7 F (37.1 C) (Temporal)   Resp 20   Ht 5' 5 (1.651 m)   Wt 216 lb (98 kg)   LMP  (LMP Unknown)   BMI 35.94 kg/m    Physical Exam Vitals and nursing note reviewed.  Constitutional:      General: She is not in acute distress.    Appearance: Normal appearance.  HENT:     Head: Normocephalic and atraumatic.     Right Ear: External ear normal.     Left Ear: External ear normal.     Nose: Nose normal.     Mouth/Throat:     Mouth: Mucous membranes are moist.     Pharynx: Oropharynx is clear.  Eyes:     Extraocular Movements: Extraocular movements intact.     Pupils: Pupils are equal, round, and reactive to light.  Cardiovascular:     Rate and Rhythm: Normal rate and regular rhythm.     Pulses: Normal pulses.     Heart sounds: Normal heart sounds.  Pulmonary:     Effort: Pulmonary effort is normal. No respiratory distress.  Breath sounds: Normal breath sounds. No wheezing, rhonchi or rales.  Musculoskeletal:        General: Normal range of motion.     Cervical back: Normal range of motion.     Right lower leg: No edema.     Left lower leg: No edema.  Lymphadenopathy:     Cervical: No cervical adenopathy.  Neurological:     General: No focal deficit present.     Mental Status: She is alert and oriented to person, place, and time.  Psychiatric:        Mood and Affect: Mood normal.        Thought Content: Thought content normal.     Results for orders placed or performed in visit on 08/06/24  Protime-INR  Result Value Ref Range   INR 1.1 (H) 0.8 - 1.0 ratio   Prothrombin Time 12.5 9.6 - 13.1 sec  APTT  Result Value Ref Range   aPTT 35.4 25.4 - 36.8 SEC    The 10-year ASCVD risk score (Arnett DK,  et al., 2019) is: 2.7%  BP Readings from Last 3 Encounters:  08/06/24 138/86  08/04/24 130/80  07/25/24 (!) 149/102   Wt Readings from Last 3 Encounters:  08/06/24 216 lb (98 kg)  08/04/24 218 lb 6.4 oz (99.1 kg)  07/24/24 216 lb (98 kg)      Last CBC Lab Results  Component Value Date   WBC 7.6 07/19/2024   HGB 12.8 07/19/2024   HCT 38.3 07/19/2024   MCV 93.0 07/19/2024   MCH 31.1 07/19/2024   RDW 14.4 07/19/2024   PLT 344 07/19/2024   Last metabolic panel Lab Results  Component Value Date   GLUCOSE 95 07/19/2024   NA 142 07/19/2024   K 4.2 07/19/2024   CL 108 07/19/2024   CO2 24 07/19/2024   BUN 9 07/19/2024   CREATININE 0.80 07/19/2024   GFRNONAA >60 07/19/2024   CALCIUM 9.3 07/19/2024   PROT 7.1 07/19/2024   ALBUMIN 4.2 07/19/2024   BILITOT 0.3 07/19/2024   ALKPHOS 65 07/19/2024   AST 16 07/19/2024   ALT 15 07/19/2024   ANIONGAP 10 07/19/2024         Assessment & Plan:   Assessment and Plan Assessment & Plan Preoperative evaluation for general surgical procedure Preoperative evaluation for surgery on February 23rd. Melatonin use discussed for sleep prior to surgery. - Ordered preoperative labs and chest x-ray.  Essential hypertension Blood pressure well-managed with Amlodipine  and Olmesartan  combined into a single pill. No swelling reported. - Continue current antihypertensive regimen.  Insomnia Difficulty sleeping related to upcoming surgery. Melatonin use discussed as a sleep aid. - Advised on melatonin use for sleep. - Recommended melatonin pills instead of gummies to avoid gastrointestinal stimulation.  Class 2 Severe Obesity due to excess calories with serious comorbidity, BMI 35 - chronic, stable, requires continuous  - comorbid HTN   I have independently evaluated patient.  [name] is a 44 y.o. female who is low risk for a low risk surgery.  There are not modifiable risk factors (smoking, etc). She has quit smoking, quit  drinking alcohol.    Review meds: If indicated, NO ACE-I/ARB day of surgery. OK to do BB or statin day of (esp if vascular sx)  Orders Placed This Encounter  Procedures   DG Chest 2 View    Standing Status:   Future    Number of Occurrences:   1    Expiration Date:   02/03/2025  Reason for Exam (SYMPTOM  OR DIAGNOSIS REQUIRED):   Pre op    Preferred imaging location?:   [facility]   Comprehensive metabolic panel with GFR    Release to patient:   Immediate [1]   APTT    Standing Status:   Future    Number of Occurrences:   1    Expiration Date:   08/06/2025   Protime-INR    Standing Status:   Future    Number of Occurrences:   1    Expiration Date:   08/06/2025   CBC w/Diff   Beta hCG quant (ref lab)   HIV antibody (with reflex)   HgB A1c   Thyroid Panel With TSH   TSH   T3   T4   T4, free     No orders of the defined types were placed in this encounter.   Return if symptoms worsen or fail to improve.  Corean LITTIE Ku, FNP   "

## 2024-08-07 ENCOUNTER — Ambulatory Visit: Payer: Self-pay | Admitting: Family Medicine

## 2024-08-07 ENCOUNTER — Ambulatory Visit: Admitting: Family Medicine

## 2024-08-07 LAB — HIV ANTIBODY (ROUTINE TESTING W REFLEX)
HIV 1&2 Ab, 4th Generation: NONREACTIVE
HIV FINAL INTERPRETATION: NEGATIVE

## 2024-08-07 LAB — THYROID PANEL WITH TSH
Free Thyroxine Index: 2.1 (ref 1.4–3.8)
T3 Uptake: 29 % (ref 22–35)
T4, Total: 7.4 ug/dL (ref 5.1–11.9)
TSH: 0.26 m[IU]/L — ABNORMAL LOW

## 2024-08-07 LAB — T3: T3, Total: 125 ng/dL (ref 76–181)

## 2024-08-07 LAB — BETA HCG QUANT (REF LAB): hCG Quant: <1 m[IU]/mL
